# Patient Record
Sex: Male | Born: 1960
Health system: Southern US, Community
[De-identification: ages and names within clinical notes are randomized; demographics above are authoritative.]

## PROBLEM LIST (undated history)

## (undated) DIAGNOSIS — K635 Polyp of colon: Secondary | ICD-10-CM

## (undated) DIAGNOSIS — Z87442 Personal history of urinary calculi: Secondary | ICD-10-CM

## (undated) DIAGNOSIS — T8189XA Other complications of procedures, not elsewhere classified, initial encounter: Secondary | ICD-10-CM

## (undated) DIAGNOSIS — E785 Hyperlipidemia, unspecified: Secondary | ICD-10-CM

## (undated) DIAGNOSIS — M549 Dorsalgia, unspecified: Secondary | ICD-10-CM

## (undated) DIAGNOSIS — K219 Gastro-esophageal reflux disease without esophagitis: Secondary | ICD-10-CM

## (undated) DIAGNOSIS — M1990 Unspecified osteoarthritis, unspecified site: Secondary | ICD-10-CM

## (undated) DIAGNOSIS — F32A Depression, unspecified: Secondary | ICD-10-CM

## (undated) DIAGNOSIS — G8929 Other chronic pain: Secondary | ICD-10-CM

## (undated) DIAGNOSIS — R519 Headache, unspecified: Secondary | ICD-10-CM

## (undated) DIAGNOSIS — T7840XA Allergy, unspecified, initial encounter: Secondary | ICD-10-CM

## (undated) DIAGNOSIS — F329 Major depressive disorder, single episode, unspecified: Secondary | ICD-10-CM

## (undated) DIAGNOSIS — F419 Anxiety disorder, unspecified: Secondary | ICD-10-CM

## (undated) DIAGNOSIS — J449 Chronic obstructive pulmonary disease, unspecified: Secondary | ICD-10-CM

## (undated) HISTORY — DX: Depression, unspecified: F32.A

## (undated) HISTORY — DX: Anxiety disorder, unspecified: F41.9

## (undated) HISTORY — DX: Hyperlipidemia, unspecified: E78.5

## (undated) HISTORY — PX: SHOULDER SURGERY: SHX246

## (undated) HISTORY — DX: Allergy, unspecified, initial encounter: T78.40XA

## (undated) HISTORY — DX: Polyp of colon: K63.5

## (undated) HISTORY — PX: EYE SURGERY: SHX253

## (undated) HISTORY — PX: COLONOSCOPY: SHX174

## (undated) HISTORY — PX: NECK SURGERY: SHX720

## (undated) HISTORY — PX: COLONOSCOPY W/ BIOPSIES AND POLYPECTOMY: SHX1376

## (undated) HISTORY — DX: Unspecified osteoarthritis, unspecified site: M19.90

## (undated) HISTORY — PX: SPINE SURGERY: SHX786

## (undated) HISTORY — PX: BACK SURGERY: SHX140

## (undated) HISTORY — PX: TONSILLECTOMY: SUR1361

## (undated) HISTORY — DX: Major depressive disorder, single episode, unspecified: F32.9

---

## 2000-07-15 ENCOUNTER — Encounter: Payer: Self-pay | Admitting: Cardiology

## 2000-07-15 ENCOUNTER — Inpatient Hospital Stay (HOSPITAL_COMMUNITY): Admission: EM | Admit: 2000-07-15 | Discharge: 2000-07-16 | Payer: Self-pay | Admitting: Emergency Medicine

## 2000-07-16 ENCOUNTER — Encounter: Payer: Self-pay | Admitting: Cardiology

## 2004-12-02 ENCOUNTER — Ambulatory Visit (HOSPITAL_COMMUNITY): Admission: RE | Admit: 2004-12-02 | Discharge: 2004-12-03 | Payer: Self-pay | Admitting: Orthopaedic Surgery

## 2004-12-15 ENCOUNTER — Encounter: Admission: RE | Admit: 2004-12-15 | Discharge: 2004-12-15 | Payer: Self-pay | Admitting: Orthopaedic Surgery

## 2004-12-30 ENCOUNTER — Encounter: Admission: RE | Admit: 2004-12-30 | Discharge: 2004-12-30 | Payer: Self-pay | Admitting: Orthopaedic Surgery

## 2008-05-29 ENCOUNTER — Ambulatory Visit (HOSPITAL_COMMUNITY): Admission: RE | Admit: 2008-05-29 | Discharge: 2008-05-29 | Payer: Self-pay | Admitting: Internal Medicine

## 2009-06-04 ENCOUNTER — Encounter: Payer: Self-pay | Admitting: Gastroenterology

## 2009-06-06 ENCOUNTER — Encounter (INDEPENDENT_AMBULATORY_CARE_PROVIDER_SITE_OTHER): Payer: Self-pay | Admitting: *Deleted

## 2009-06-16 ENCOUNTER — Encounter (INDEPENDENT_AMBULATORY_CARE_PROVIDER_SITE_OTHER): Payer: Self-pay

## 2009-06-17 ENCOUNTER — Ambulatory Visit: Payer: Self-pay | Admitting: Gastroenterology

## 2009-07-01 ENCOUNTER — Ambulatory Visit: Payer: Self-pay | Admitting: Gastroenterology

## 2009-07-01 LAB — HM COLONOSCOPY

## 2009-07-04 ENCOUNTER — Encounter: Payer: Self-pay | Admitting: Gastroenterology

## 2010-06-04 NOTE — Miscellaneous (Signed)
Summary: Lec previsit  Clinical Lists Changes  Medications: Added new medication of MOVIPREP 100 GM  SOLR (PEG-KCL-NACL-NASULF-NA ASC-C) As per prep instructions. - Signed Rx of MOVIPREP 100 GM  SOLR (PEG-KCL-NACL-NASULF-NA ASC-C) As per prep instructions.;  #1 x 0;  Signed;  Entered by: Ulis Rias RN;  Authorized by: Louis Meckel MD;  Method used: [facility] [address] [phone]*, [address], [place], [place], [place]  [id number], Ph: [phone] or [phone], Fax: [phone] Allergies: Added new allergy or adverse reaction of CEFTIN Observations: Added new observation of NKA: F (06/17/2009 15:56)    Prescriptions: MOVIPREP 100 GM  SOLR (PEG-KCL-NACL-NASULF-NA ASC-C) As per prep instructions.  #1 x 0   Entered by:   Ulis Rias RN   Authorized by:   Louis Meckel MD   Signed by:   Ulis Rias RN on 06/17/2009   Method used:   [facility]  [address] [phone]* (retail)       [address]       [place], [place]  [id number]       Ph: [phone] or [phone]       Fax: [phone]   RxID:   [id number]

## 2010-06-04 NOTE — Letter (Signed)
Summary: Patient Notice-Hyperplastic Polyps  [facility]  [address] [place], [place] [address]   Phone: [phone]  Fax: [phone]        July 04, 2009 MRN: [id number]    [name] [address] [place], [place]  [address]    Dear [name],  I am pleased to inform you that the colon polyp(s) removed during your recent colonoscopy was (were) found to be hyperplastic.  These types of polyps are NOT pre-cancerous.  It is therefore my recommendation that you have a repeat colonoscopy examination in _5 years for routine colorectal cancer screening.  Should you develop new or worsening symptoms of abdominal pain, bowel habit changes or bleeding from the rectum or bowels, please schedule an evaluation with either your primary care physician or with me.  Additional information/recommendations:  __No further action with gastroenterology is needed at this time.      Please follow-up with your primary care physician for your other      healthcare needs. __Please call [phone] to schedule a return visit to review      your situation.  __Please keep your follow-up visit as already scheduled.  _x_Continue treatment plan as outlined the day of your exam.  Please call us if you are having persistent problems or have questions about your condition that have not been fully answered at this time.  Sincerely,  Louis Meckel MD This letter has been electronically signed by your physician.  Appended Document: Patient Notice-Hyperplastic Polyps Letter mailed 3.7.11

## 2010-06-04 NOTE — Letter (Signed)
Summary: [facility] Instructions  [facility]  [address] [place], [place] [address]   Phone: [phone]  Fax: [phone]       [name]    02-16-1961    MRN: [id number]        Procedure Day /Date: 07/01/09  Tuesday     Arrival Time:  10:30am     Procedure Time: 11:30am     Location of Procedure:                    _x _  [facility] (4th Floor)                        PREPARATION FOR COLONOSCOPY WITH MOVIPREP   Starting 5 days prior to your procedure _2/24/11 _ do not eat nuts, seeds, popcorn, corn, beans, peas,  salads, or any raw vegetables.  Do not take any fiber supplements (e.g. Metamucil, Citrucel, and Benefiber).  THE DAY BEFORE YOUR PROCEDURE         DATE:  06/30/09  DAY:  Monday  1.  Drink clear liquids the entire day-NO SOLID FOOD  2.  Do not drink anything colored red or purple.  Avoid juices with pulp.  No orange juice.  3.  Drink at least 64 oz. (8 glasses) of fluid/clear liquids during the day to prevent dehydration and help the prep work efficiently.  CLEAR LIQUIDS INCLUDE: Water Jello Ice Popsicles Tea (sugar ok, no milk/cream) Powdered fruit flavored drinks Coffee (sugar ok, no milk/cream) Gatorade Juice: apple, white grape, white cranberry  Lemonade Clear bullion, consomm, broth Carbonated beverages (any kind) Strained chicken noodle soup Hard Candy                             4.  In the morning, mix first dose of MoviPrep solution:    Empty 1 Pouch A and 1 Pouch B into the disposable container    Add lukewarm drinking water to the top line of the container. Mix to dissolve    Refrigerate (mixed solution should be used within 24 hrs)  5.  Begin drinking the prep at 5:00 p.m. The MoviPrep container is divided by 4 marks.   Every 15 minutes drink the solution down to the next mark (approximately 8 oz) until the full liter is complete.   6.  Follow completed prep with 16 oz of clear liquid of your choice  (Nothing red or purple).  Continue to drink clear liquids until bedtime.  7.  Before going to bed, mix second dose of MoviPrep solution:    Empty 1 Pouch A and 1 Pouch B into the disposable container    Add lukewarm drinking water to the top line of the container. Mix to dissolve    Refrigerate  THE DAY OF YOUR PROCEDURE      DATE:  07/01/09 DAY:  Tueday  Beginning at 6:30 a.m. (5 hours before procedure):         1. Every 15 minutes, drink the solution down to the next mark (approx 8 oz) until the full liter is complete.  2. Follow completed prep with 16 oz. of clear liquid of your choice.    3. You may drink clear liquids until 9:30am   (2 HOURS BEFORE PROCEDURE).   MEDICATION INSTRUCTIONS  Unless otherwise instructed, you should take regular prescription medications with a small sip of water   as  early as possible the morning of your procedure.         OTHER INSTRUCTIONS  You will need a responsible adult at least 49 years of age to accompany you and drive you home.   This person must remain in the waiting room during your procedure.  Wear loose fitting clothing that is easily removed.  Leave jewelry and other valuables at home.  However, you may wish to bring a book to read or  an iPod/MP3 player to listen to music as you wait for your procedure to start.  Remove all body piercing jewelry and leave at home.  Total time from sign-in until discharge is approximately 2-3 hours.  You should go home directly after your procedure and rest.  You can resume normal activities the  day after your procedure.  The day of your procedure you should not:   Drive   Make legal decisions   Operate machinery   Drink alcohol   Return to work  You will receive specific instructions about eating, activities and medications before you leave.    The above instructions have been reviewed and explained to me by   Ulis Rias RN  June 17, 2009 4:09 PM     I fully  understand and can verbalize these instructions _____________________________ Date _________

## 2010-06-04 NOTE — Procedures (Signed)
Summary: Colonoscopy  Patient: [name] Note: All result statuses are Final unless otherwise noted.  Tests: (1) Colonoscopy (COL)   COL Colonoscopy           DONE     [facility]     [address]     [place], [place]  [address]           COLONOSCOPY PROCEDURE REPORT           PATIENT:  Richard, [name]  MR#:  [id number]     BIRTHDATE:  1960-08-30, 48 yrs. old  GENDER:  male           ENDOSCOPIST:  Barbette Hair. Arlyce Dice, MD     Referred by:           PROCEDURE DATE:  07/01/2009     PROCEDURE:  Colonoscopy with snare polypectomy     ASA CLASS:  Class II     INDICATIONS:  Elevated Risk Screening, family history of colon     cancer Fraternal grandfather, father, fraternal uncle           MEDICATIONS:   Fentanyl 75 mcg IV, Versed 7 mg IV, Benadryl 50 mg     IV           DESCRIPTION OF PROCEDURE:   After the risks benefits and     alternatives of the procedure were thoroughly explained, informed     consent was obtained.  Digital rectal exam was performed and     revealed no abnormalities.   The LB CF-[id number] endoscope     was introduced through the anus and advanced to the cecum, which     was identified by the ileocecal valve, without limitations.  The     quality of the prep was excellent, using MoviPrep.  The instrument     was then slowly withdrawn as the colon was fully examined.     <<PROCEDUREIMAGES>>           FINDINGS:  There were multiple polyps identified and removed. in     the rectum and sigmoid colon. At least 4 1-81mm hyperplastic     appearing polyps in rectum and sigmoid. 3 were removed via cold     polypectomy snare and submitted to pathology (see image13,     image14, and image15).  A diverticulum was found in the cecum (see     image2).  Mild diverticulosis was found in the sigmoid colon (see     image11).  This was otherwise a normal examination of the colon     (see image1, image4, image5, image6, image7, image9, image10, and  image12).   Retroflexed views in the rectum revealed no     abnormalities.    The scope was then withdrawn from the patient     and the procedure completed.           COMPLICATIONS:  None           ENDOSCOPIC IMPRESSION:     1) Polyps, multiple in the rectum and sigmoid colon     2) Diverticulum in the cecum     3) Mild diverticulosis in the sigmoid colon     4) Otherwise normal examination     RECOMMENDATIONS:     1) If the polyp(s) removed today are proven to be adenomatous     (pre-cancerous) polyps, you will need a repeat colonoscopy in 5     years. Otherwise you should continue to follow colorectal  cancer     screening guidelines for "routine risk" patients with colonoscopy     in 10 years.     2) Given your significant family history of colon cancer, you     should have a repeat colonoscopy in 5 years           REPEAT EXAM:  In 5 year(s) for Colonoscopy.           ______________________________     Barbette Hair. Arlyce Dice, MD           CC:  Marisue Brooklyn, DO           n.     eSIGNED:   Barbette Hair. Kaplan at 07/01/2009 12:30 PM           Page 2 of 3   [place], [place], [id number]  Note: An exclamation mark (!) indicates a result that was not dispersed into the flowsheet. Document Creation Date: 07/01/2009 12:30 PM _______________________________________________________________________  (1) Order result status: Final Collection or observation date-time: 07/01/2009 12:21 Requested date-time:  Receipt date-time:  Reported date-time:  Referring Physician:   Ordering Physician: Melvia Heaps [phone]) Specimen Source:  Source: Launa Grill Order Number: [id number] Lab site:   Appended Document: Colonoscopy     Procedures Next Due Date:    Colonoscopy: 07/2014

## 2010-06-04 NOTE — Letter (Signed)
Summary: Previsit letter  [facility]  [address] [place], [place] [address]   Phone: [phone]  Fax: [phone]       06/06/2009 MRN: [id number]  [facility] [address] [place], [place]  [address]  Dear [name],  Welcome to the Gastroenterology Division at [facility].    You are scheduled to see a nurse for your pre-procedure visit on 06-17-09 at 4:00p.m. on the 3rd floor at [facility], [address]  We ask that you try to arrive at our office 15 minutes prior to your appointment time to allow for check-in.  Your nurse visit will consist of discussing your medical and surgical history, your immediate family medical history, and your medications.    Please bring a complete list of all your medications or, if you prefer, bring the medication bottles and we will list them.  We will need to be aware of both prescribed and over the counter drugs.  We will need to know exact dosage information as well.  If you are on blood thinners (Coumadin, Plavix, Aggrenox, Ticlid, etc.) please call our office today/prior to your appointment, as we need to consult with your physician about holding your medication.   Please be prepared to read and sign documents such as consent forms, a financial agreement, and acknowledgement forms.  If necessary, and with your consent, a friend or relative is welcome to sit-in on the nurse visit with you.  Please bring your insurance card so that we may make a copy of it.  If your insurance requires a referral to see a specialist, please bring your referral form from your primary care physician.  No co-pay is required for this nurse visit.     If you cannot keep your appointment, please call [phone] to cancel or reschedule prior to your appointment date.  This allows [place] the opportunity to schedule an appointment for another patient in need of care.    Thank you for choosing [facility] for your medical  needs.  We appreciate the opportunity to care for you.  Please visit [place] at our website  to learn more about our practice.                     Sincerely.                                                                                                                   The Gastroenterology Division

## 2010-09-03 ENCOUNTER — Encounter: Payer: Self-pay | Admitting: Family Medicine

## 2010-09-03 ENCOUNTER — Ambulatory Visit (INDEPENDENT_AMBULATORY_CARE_PROVIDER_SITE_OTHER): Payer: BC Managed Care – PPO | Admitting: Family Medicine

## 2010-09-03 VITALS — BP 108/72 | Ht 71.75 in | Wt 229.8 lb

## 2010-09-03 DIAGNOSIS — F17200 Nicotine dependence, unspecified, uncomplicated: Secondary | ICD-10-CM | POA: Insufficient documentation

## 2010-09-03 DIAGNOSIS — Z720 Tobacco use: Secondary | ICD-10-CM | POA: Insufficient documentation

## 2010-09-03 DIAGNOSIS — M129 Arthropathy, unspecified: Secondary | ICD-10-CM

## 2010-09-03 DIAGNOSIS — Z0001 Encounter for general adult medical examination with abnormal findings: Secondary | ICD-10-CM | POA: Insufficient documentation

## 2010-09-03 DIAGNOSIS — M1990 Unspecified osteoarthritis, unspecified site: Secondary | ICD-10-CM

## 2010-09-03 DIAGNOSIS — Z0000 Encounter for general adult medical examination without abnormal findings: Secondary | ICD-10-CM | POA: Insufficient documentation

## 2010-09-03 LAB — CBC WITH DIFFERENTIAL/PLATELET
Basophils Absolute: 0.1 10*3/uL (ref 0.0–0.1)
Basophils Relative: 0.7 % (ref 0.0–3.0)
Eosinophils Absolute: 0.2 10*3/uL (ref 0.0–0.7)
Eosinophils Relative: 2.1 % (ref 0.0–5.0)
HCT: 45.1 % (ref 39.0–52.0)
Hemoglobin: 15.4 g/dL (ref 13.0–17.0)
Lymphocytes Relative: 26 % (ref 12.0–46.0)
Lymphs Abs: 2.9 10*3/uL (ref 0.7–4.0)
MCHC: 34.3 g/dL (ref 30.0–36.0)
MCV: 94.1 fl (ref 78.0–100.0)
Monocytes Absolute: 0.7 10*3/uL (ref 0.1–1.0)
Monocytes Relative: 6.4 % (ref 3.0–12.0)
Neutro Abs: 7.2 10*3/uL (ref 1.4–7.7)
Neutrophils Relative %: 64.8 % (ref 43.0–77.0)
Platelets: 277 10*3/uL (ref 150.0–400.0)
RBC: 4.79 Mil/uL (ref 4.22–5.81)
RDW: 14 % (ref 11.5–14.6)
WBC: 11.2 10*3/uL — ABNORMAL HIGH (ref 4.5–10.5)

## 2010-09-03 LAB — BASIC METABOLIC PANEL
BUN: 18 mg/dL (ref 6–23)
CO2: 29 mEq/L (ref 19–32)
Calcium: 9.5 mg/dL (ref 8.4–10.5)
Chloride: 100 mEq/L (ref 96–112)
Creatinine, Ser: 0.9 mg/dL (ref 0.4–1.5)
GFR: 97.92 mL/min (ref >60.00)
Glucose, Bld: 89 mg/dL (ref 70–99)
Potassium: 5.1 mEq/L (ref 3.5–5.1)
Sodium: 137 mEq/L (ref 135–145)

## 2010-09-03 LAB — LIPID PANEL
Cholesterol: 192 mg/dL (ref 0–200)
HDL: 52.4 mg/dL (ref >39.00)
LDL Cholesterol: 124 mg/dL — ABNORMAL HIGH (ref 0–99)
Total CHOL/HDL Ratio: 4
Triglycerides: 78 mg/dL (ref 0.0–149.0)
VLDL: 15.6 mg/dL (ref 0.0–40.0)

## 2010-09-03 LAB — POCT URINALYSIS DIPSTICK
Bilirubin, UA: NEGATIVE
Blood, UA: NEGATIVE
Glucose, UA: NEGATIVE
Ketones, UA: NEGATIVE
Leukocytes, UA: NEGATIVE
Nitrite, UA: NEGATIVE
Protein, UA: NEGATIVE
Spec Grav, UA: 1.005
Urobilinogen, UA: 0.2
pH, UA: 7.5

## 2010-09-03 LAB — HEPATIC FUNCTION PANEL
ALT: 21 U/L (ref 0–53)
AST: 17 U/L (ref 0–37)
Albumin: 4.2 g/dL (ref 3.5–5.2)
Alkaline Phosphatase: 66 U/L (ref 39–117)
Bilirubin, Direct: 0.1 mg/dL (ref 0.0–0.3)
Total Bilirubin: 0.6 mg/dL (ref 0.3–1.2)
Total Protein: 6.8 g/dL (ref 6.0–8.3)

## 2010-09-03 LAB — TSH: TSH: 1.31 u[IU]/mL (ref 0.35–5.50)

## 2010-09-03 LAB — PSA: PSA: 0.5 ng/mL (ref 0.10–4.00)

## 2010-09-03 MED ORDER — VARENICLINE TARTRATE 0.5 MG X 11 & 1 MG X 42 PO MISC: ORAL | Status: DC

## 2010-09-03 MED ORDER — MELOXICAM 15 MG PO TABS: ORAL_TABLET | ORAL | Status: DC

## 2010-09-03 NOTE — Progress Notes (Signed)
  Subjective:    Patient ID: [name], male    DOB: 08-15-1960, 50 y.o.   MRN: [id number]  HPI Pt here for cpe and to establish.  No complaints.      Review of Systems  Review of Systems  Constitutional: Negative for activity change, appetite change and fatigue.  HENT: Negative for hearing loss, congestion, tinnitus and ear discharge.  dentist q79m Eyes: Negative for visual disturbance (see optho q2y -- vision corrected to 20/20 with glasses).  Respiratory: Negative for cough, chest tightness and shortness of breath.   Cardiovascular: Negative for chest pain, palpitations and leg swelling.  Gastrointestinal: Negative for abdominal pain, diarrhea, constipation and abdominal distention.  Genitourinary: Negative for urgency, frequency, decreased urine volume and difficulty urinating.  Musculoskeletal: Negative for back pain, arthralgias and gait problem.  Skin: Negative for color change, pallor and rash.  Neurological: Negative for dizziness, light-headedness, numbness and headaches.  Hematological: Negative for adenopathy. Does not bruise/bleed easily.  Psychiatric/Behavioral: Negative for suicidal ideas, confusion, sleep disturbance, self-injury, dysphoric mood, decreased concentration and agitation.      Objective:   Physical Exam    BP 108/72  Ht 5' 11.75" (1.822 m)  Wt 229 lb 12.8 oz (104.237 kg)  BMI 31.38 kg/m2  General Appearance:    Alert, cooperative, no distress, appears stated age  Head:    Normocephalic, without obvious abnormality, atraumatic  Eyes:    PERRL, conjunctiva/corneas clear, EOM's intact, fundi    benign, both eyes       Ears:    Normal TM's and external ear canals, both ears  Nose:   Nares normal, septum midline, mucosa normal, no drainage   or sinus tenderness  Throat:   Lips, mucosa, and tongue normal; teeth and gums normal  Neck:   Supple, symmetrical, trachea midline, no adenopathy;       thyroid:  No enlargement/tenderness/nodules; no  carotid   bruit or JVD  Back:     Symmetric, no curvature, ROM normal, no CVA tenderness  Lungs:     Clear to auscultation bilaterally, respirations unlabored  Chest wall:    No tenderness or deformity  Heart:    Regular rate and rhythm, S1 and S2 normal, no murmur, rub   or gallop  Abdomen:     Soft, non-tender, bowel sounds active all four quadrants,    no masses, no organomegaly  Genitalia:    Normal male without lesion, discharge or tenderness  Rectal:    Normal tone, normal prostate, no masses or tenderness;   guaiac negative stool  Extremities:   Extremities normal, atraumatic, no cyanosis or edema  Pulses:   2+ and symmetric all extremities  Skin:   Skin color, texture, turgor normal, no rashes or lesions  Lymph nodes:   Cervical, supraclavicular, and axillary nodes normal  Neurologic:   CNII-XII intact. Normal strength, sensation and reflexes      throughout      Assessment & Plan:

## 2010-09-03 NOTE — Assessment & Plan Note (Signed)
Check labs  ghm utd   

## 2010-09-03 NOTE — Assessment & Plan Note (Signed)
chantix starting  con't for at least 3 months

## 2010-09-03 NOTE — Patient Instructions (Signed)
Smoking Cessation This document explains the best ways for you to quit smoking and new treatments to help. It lists new medicines that can double or triple your chances of quitting and quitting for good. It also considers ways to avoid relapses and concerns you may have about quitting, including weight gain. NICOTINE: A POWERFUL ADDICTION If you have tried to quit smoking, you know how hard it can be. It is hard because nicotine is a very addictive drug. For some people, it can be as addictive as heroin or cocaine. Usually, people make 2 or 3 tries, or more, before finally being able to quit. Each time you try to quit, you can learn about what helps and what hurts. Quitting takes hard work and a lot of effort, but you can quit smoking. QUITTING SMOKING IS ONE OF THE MOST IMPORTANT THINGS YOU WILL EVER DO:  You will live longer, feel better, and live better.   The impact on your body of quitting smoking is felt almost immediately:   Within 20 minutes, blood pressure decreases. Pulse returns to its normal level.   After 8 hours, carbon monoxide levels in the blood return to normal. Oxygen level increases.   After 24 hours, chance of heart attack starts to decrease. Breath, hair, and body stop smelling like smoke.   After 48 hours, damaged nerve endings begin to recover. Sense of taste and smell improve.   After 72 hours, the body is virtually free of nicotine. Bronchial tubes relax and breathing becomes easier.   After 2 to 12 weeks, lungs can hold more air. Exercise becomes easier and circulation improves.   Quitting will lower your chance of having a heart attack, stroke, cancer, or lung disease:   After 1 year, the risk of coronary heart disease is cut in half.   After 5 years, the risk of stroke falls to the same as a nonsmoker.   After 10 years, the risk of lung cancer is cut in half and the risk of other cancers decreases significantly.   After 15 years, the risk of coronary heart  disease drops, usually to the level of a nonsmoker.   If you are pregnant, quitting smoking will improve your chances of having a healthy baby.   The people you live with, especially your children, will be healthier.   You will have extra money to spend on things other than cigarettes.  FIVE KEYS TO QUITTING Studies have shown that these 5 steps will help you quit smoking and quit for good. You have the best chances of quitting if you use them together: 1. Get ready.  2. Get support and encouragement.  3. Learn new skills and behaviors.  4. Get medicine to reduce your nicotine addiction and use it correctly.  5. Be prepared for relapse or difficult situations. Be determined to continue trying to quit, even if you do not succeed at first.  1. GET READY  Set a quit date.   Change your environment.   Get rid of ALL cigarettes, ashtrays, matches, and lighters in your home, car, and place of work.   Do not let people smoke in your home.   Review your past attempts to quit. Think about what worked and what did not.   Once you quit, do not smoke. NOT EVEN A PUFF!  2. GET SUPPORT AND ENCOURAGEMENT Studies have shown that you have a better chance of being successful if you have help. You can get support in many ways.  Tell   your family, friends, and coworkers that you are going to quit and need their support. Ask them not to smoke around you.   Talk to your caregivers (doctor, dentist, nurse, pharmacist, psychologist, and/or smoking counselor).   Get individual, group, or telephone counseling and support. The more counseling you have, the better your chances are of quitting. Programs are available at local hospitals and health centers. Call your local health department for information about programs in your area.   Spiritual beliefs and practices may help some smokers quit.   Quit meters are small computer programs online or downloadable that keep track of quit statistics, such as amount  of "quit-time," cigarettes not smoked, and money saved.   Many smokers find one or more of the many self-help books available useful in helping them quit and stay off tobacco.  3. LEARN NEW SKILLS AND BEHAVIORS  Try to distract yourself from urges to smoke. Talk to someone, go for a walk, or occupy your time with a task.   When you first try to quit, change your routine. Take a different route to work. Drink tea instead of coffee. Eat breakfast in a different place.   Do something to reduce your stress. Take a hot bath, exercise, or read a book.   Plan something enjoyable to do every day. Reward yourself for not smoking.   Explore interactive web-based programs that specialize in helping you quit.  4. GET MEDICINE AND USE IT CORRECTLY Medicines can help you stop smoking and decrease the urge to smoke. Combining medicine with the above behavioral methods and support can quadruple your chances of successfully quitting smoking. The U[place]. Food and Drug Administration (FDA) has approved 7 medicines to help you quit smoking. These medicines fall into 3 categories.  Nicotine replacement therapy (delivers nicotine to your body without the negative effects and risks of smoking):   Nicotine gum: Available over-the-counter.   Nicotine lozenges: Available over-the-counter.   Nicotine inhaler: Available by prescription.   Nicotine nasal spray: Available by prescription.   Nicotine skin patches (transdermal): Available by prescription and over-the-counter.   Antidepressant medicine (helps people abstain from smoking, but how this works is unknown):   Bupropion sustained-release (SR) tablets: Available by prescription.   Nicotinic receptor partial agonist (simulates the effect of nicotine in your brain):   Varenicline tartrate tablets: Available by prescription.   Ask your caregiver for advice about which medicines to use and how to use them. Carefully read the information on the package.    Everyone who is trying to quit may benefit from using a medicine. If you are pregnant or trying to become pregnant, nursing an infant, you are under age 18, or you smoke fewer than 10 cigarettes per day, talk to your caregiver before taking any nicotine replacement medicines.   You should stop using a nicotine replacement product and call your caregiver if you experience nausea, dizziness, weakness, vomiting, fast or irregular heartbeat, mouth problems with the lozenge or gum, or redness or swelling of the skin around the patch that does not go away.   Do not use any other product containing nicotine while using a nicotine replacement product.   Talk to your caregiver before using these products if you have diabetes, heart disease, asthma, stomach ulcers, you had a recent heart attack, you have high blood pressure that is not controlled with medicine, a history of irregular heartbeat, or you have been prescribed medicine to help you quit smoking.  5. BE PREPARED FOR RELAPSE OR   DIFFICULT SITUATIONS  Most relapses occur within the first 3 months after quitting. Do not be discouraged if you start smoking again. Remember, most people try several times before they finally quit.   You may have symptoms of withdrawal because your body is used to nicotine. You may crave cigarettes, be irritable, feel very hungry, cough often, get headaches, or have difficulty concentrating.   The withdrawal symptoms are only temporary. They are strongest when you first quit, but they will go away within 10 to 14 days.  Here are some difficult situations to watch for:  Alcohol. Avoid drinking alcohol. Drinking lowers your chances of successfully quitting.   Caffeine. Try to reduce the amount of caffeine you consume. It also lowers your chances of successfully quitting.   Other smokers. Being around smoking can make you want to smoke. Avoid smokers.   Weight gain. Many smokers will gain weight when they quit, usually  less than 10 pounds. Eat a healthy diet and stay active. Do not let weight gain distract you from your main goal, quitting smoking. Some medicines that help you quit smoking may also help delay weight gain. You can always lose the weight gained after you quit.   Bad mood or depression. There are a lot of ways to improve your mood other than smoking.  If you are having problems with any of these situations, talk to your caregiver. SPECIAL SITUATIONS OR CONDITIONS Studies suggest that everyone can quit smoking. Your situation or condition can give you a special reason to quit.  Pregnant women/New mothers: By quitting, you protect your baby's health and your own.   Hospitalized patients: By quitting, you reduce health problems and help healing.   Heart attack patients: By quitting, you reduce your risk of a second heart attack.   Lung, head, and neck cancer patients: By quitting, you reduce your chance of a second cancer.   Parents of children and adolescents: By quitting, you protect your children from illnesses caused by secondhand smoke.  QUESTIONS TO THINK ABOUT Think about the following questions before you try to stop smoking. You may want to talk about your answers with your caregiver.  Why do you want to quit?   If you tried to quit in the past, what helped and what did not?   What will be the most difficult situations for you after you quit? How will you plan to handle them?   Who can help you through the tough times? Your family? Friends? Caregiver?   What pleasures do you get from smoking? What ways can you still get pleasure if you quit?  Here are some questions to ask your caregiver:  How can you help me to be successful at quitting?   What medicine do you think would be best for me and how should I take it?   What should I do if I need more help?   What is smoking withdrawal like? How can I get information on withdrawal?  Quitting takes hard work and a lot of effort,  but you can quit smoking. FOR MORE INFORMATION Smokefree.gov (http://www.smokefree.gov) provides free, accurate, evidence-based information and professional assistance to help support the immediate and long-term needs of people trying to quit smoking. Document Released: 04/13/2001 Document Re-Released: 10/07/2009 ExitCare Patient Information 2011 [employer]. 

## 2010-09-18 NOTE — Op Note (Signed)
[name], [name]            ACCOUNT NO.:  [id number]   MEDICAL RECORD NO.:  [id number]          PATIENT TYPE:  OIB   LOCATION:  2858                         FACILITY:  [facility]   PHYSICIAN:  Mark C. Ophelia Charter, M.D.    DATE OF BIRTH:  08/08/1960   DATE OF PROCEDURE:  12/02/2004  DATE OF DISCHARGE:                                 OPERATIVE REPORT   PREOPERATIVE DIAGNOSIS:  C5-6 and C6-7 spondylosis with disk protrusion.   POSTOPERATIVE DIAGNOSIS:  C5-6 and C6-7 spondylosis with disk protrusion.   OPERATION PERFORMED:  C5-6, C6-7 anterior cervical diskectomy and fusion,  allograft and plating (two level fusion).   SURGEON:  Mark C. Ophelia Charter, M.D.   ANESTHESIA:  GOT.   ESTIMATED BLOOD LOSS:  Minimal.   DRAINS:  One Hemovac neck.   DESCRIPTION OF PROCEDURE:  After induction of general anesthesia,  preoperative Ancef prophylaxis, standard prep with head halter traction  using horse shoe head holder well padded with careful positioning, arms at  the side with a towel with a yellow foam pads for the ulnar nerve and elbow,  neck was prepped with DuraPrep, area was squared with towels.  Sterile skin  markers used.  Betadine, Vi-drape application, sterile Mayo stand head,  thyroid sheets and drapes.  Incision was made at the midline, extending to  the left over the C6 level.  Dissection continued underneath the omohyoid  after splitting the platysma in line with the fibers and blunt dissection  down to longus colli with carotid sheath and contents lateral.  Needle was  placed at C5-6 confirmed with lateral C-spine fluoroscopy.  Self-retaining  retractors were placed and C5-6 level was surgically treated first.  After  diskectomy was performed, progressing back to the posterior longitudinal  ligament, the TPS bur was used for burring of the end plates removing the  anterior osteophytes.  Posterior spurs were thinned down. Then with the  operating microscope draped and brought in, the spurs  were removed with  microdissection techniques using 1 and 2 mm Kerrisons, microcurettes and  pituitary.  1 mm Kerrison was used to remove the posterior longitudinal  ligament.  Dura was well visualized.  Spurs were removed on each side.  Gutters were stripped off vertebral joint for egress of any fluid.  Trial  sizers were used and a 7L graft fit nicely with tight fit.  Distraction  applied by the CRNA and head halter traction.  Graft was impacted with a  hammer and identical procedure was repeated at C6-7.  There was disk  material protruding on both sides.  All the disk material was retained by  the ligament but the ligament was bulging with some disk material underneath  the ligament and behind the vertebral body both above and below.  This was  gently removed as well as the ligament until the dura was well visualized.  Spurs were removed that were indenting the dura as well portion of the hard  disk complex.  After irrigation with saline solution with good decompression  of the dura, another 7L was selected based on size.  The Synthes green  titanium plate was selected and 14 mm blue screws were inserted.  A small  screw plate holder was inserted first to hold the plate securely and  initially one screw was a little bit low, had to be removed repositioned and  then reinserted.  It was checked under AP and lateral.  There was good  position of plate and screws.  Fluoroscopic picture was taken after  irrigation with saline solution and all four  screws were tight.  Hemovac was placed through a separate stab incision  using the trocar with an inside to out technique in line with the skin  incision. Platysma closed with 3-0 Vicryl, 4-0 Vicryl subcuticular closure.  Tincture of benzoin and Steri-Strips, 4 x 4s, tape and soft cervical collar.  Instrument count, needle count was correct.       MCY/MEDQ  D:  12/02/2004  T:  12/03/2004  Job:  [id number]

## 2010-09-18 NOTE — Discharge Summary (Signed)
[place]  Patient:    [name], [name]                   MRN: [id number] Adm. Date:  [id number] Disc. Date: [id number] Attending:  Learta Codding Dictator:   Joellyn Rued, P.A.-C. CC:         [facility]   Referring Physician Discharge [facility]  DATE OF BIRTH:  09/21/2060  SUMMARY OF HISTORY:  Mr. Finklea is a 50 year old male without cardiac history.  At approximately 9:30 on the day of admission, he spontaneously noticed hives on his forearms, a pressure associated with dizziness, and facial flushing.  He talked to a nurse at work and went to the [facility].  His hives faded during his medical exam, but the pressure and flushing continued with slight improvement with one sublingual nitroglycerin. On presentation to the emergency room at [facility], it was a 1 on a scale of 0-10, associated with mild shortness of breath.  It was noted that he was recently prescribed cephalosporin for sinusitis.  He smokes and has a history of chronic back pain.  His lipid status is unknown.  LABORATORY DATA:  Serial CKs and troponins were negative for myocardial infarction.  Fasting lipids showed a cholesterol of 176, triglycerides 104, HDL 33, and LDL 122.  The hemoglobin was 14.8, hematocrit 43.3, normal indices, platelets 265, and WBC 9.3.  PT 12.5, PTT 26.  Sodium 139, potassium 4.5, BUN 15, creatinine 1.0, glucose 109.  The EKG showed normal sinus rhythm.  HOSPITAL COURSE:  He was admitted to the hospital.  Overnight he did not have any further discomfort.  Enzymes and EKGs were negative for myocardial infarction.  A stress Cardiolite was performed on July 16, 2000.  Utilizing the Bruce protocol, he ambulated, achieving greater than his 85% maximum heart rate.  He did not have any associated EKG changes.  His only complaints were tired and he was slightly short of breath.  Imaging showed an EF of 55% and  no ischemia or scarring.  After review with Theron Arista C. Eden Emms, M.D., it was felt that he could be discharged home.  DISCHARGE DIAGNOSIS:  Chest discomfort of unknown etiology, probable allergic reaction with hives and flushing.  DISPOSITION:  He was discharged home.  SPECIAL INSTRUCTIONS:  He was asked to discontinue the antibiotic that he was taking.  To avoid smoking or tobacco products.  DIET:  Maintain low-salt, low-fat, and low-cholesterol diet.  FOLLOW-UP:  To follow up with his physician at [facility] on [address] DD:  07/27/00 TD:  07/27/00 Job: [id number] ID/[id number]

## 2011-06-08 ENCOUNTER — Ambulatory Visit (INDEPENDENT_AMBULATORY_CARE_PROVIDER_SITE_OTHER): Payer: BC Managed Care – PPO | Admitting: Family Medicine

## 2011-06-08 ENCOUNTER — Encounter: Payer: Self-pay | Admitting: Family Medicine

## 2011-06-08 VITALS — BP 120/80 | HR 98 | Temp 98.0°F | Wt 234.0 lb

## 2011-06-08 DIAGNOSIS — M62838 Other muscle spasm: Secondary | ICD-10-CM

## 2011-06-08 DIAGNOSIS — E785 Hyperlipidemia, unspecified: Secondary | ICD-10-CM

## 2011-06-08 DIAGNOSIS — R319 Hematuria, unspecified: Secondary | ICD-10-CM

## 2011-06-08 LAB — POCT URINALYSIS DIPSTICK
Bilirubin, UA: NEGATIVE
Glucose, UA: NEGATIVE
Ketones, UA: NEGATIVE
Leukocytes, UA: NEGATIVE
Nitrite, UA: NEGATIVE
Protein, UA: NEGATIVE
Spec Grav, UA: 1.015
Urobilinogen, UA: 0.2
pH, UA: 6

## 2011-06-08 MED ORDER — CYCLOBENZAPRINE HCL 10 MG PO TABS: 10.0000 mg | ORAL_TABLET | Freq: Three times a day (TID) | ORAL | Status: AC | PRN

## 2011-06-08 NOTE — Progress Notes (Signed)
  Subjective:    [name] is a 51 y.o. male who presents for evaluation of low back pain. The patient has had recurrent self limited episodes of low back pain in the past. Symptoms have been present for several days and are gradually worsening.  Onset was related to / precipitated by no known injury. The pain is located in the right lumbar area and radiates to the right hip, . The pain is described as burning and sharp and occurs during sleep. He is currently in no pain. Symptoms are exacerbated by twisting and only occurs at night in bed when he moves. Symptoms are improved by getting up and walking around. He has also tried acetaminophen and NSAIDs which provided no symptom relief. He has no other symptoms associated with the back pain. The patient has no "red flag" history indicative of complicated back pain.  The following portions of the patient's history were reviewed and updated as appropriate: allergies, current medications, past family history, past medical history, past social history, past surgical history and problem list.  Review of Systems Pertinent items are noted in HPI.    Objective:   Full range of motion without pain, no tenderness, no spasm, no curvature. Normal reflexes, gait, strength and negative straight-leg raise.    Assessment:    Nonspecific acute low back pain    Plan:    Educational material distributed. Stretching exercises discussed. Short (2-4 day) period of relative rest recommended until acute symptoms improve. Ice to affected area as needed for local pain relief. Heat to affected area as needed for local pain relief. NSAIDs per medication orders. Muscle relaxants per medication orders. Follow-up in 2 weeks. ---if no better

## 2011-06-08 NOTE — Patient Instructions (Signed)
Back Pain, Adult Low back pain is very common. About 1 in 5 people have back pain.The cause of low back pain is rarely dangerous. The pain often gets better over time.About half of people with a sudden onset of back pain feel better in just 2 weeks. About 8 in 10 people feel better by 6 weeks.  CAUSES Some common causes of back pain include:  Strain of the muscles or ligaments supporting the spine.   Wear and tear (degeneration) of the spinal discs.   Arthritis.   Direct injury to the back.  DIAGNOSIS Most of the time, the direct cause of low back pain is not known.However, back pain can be treated effectively even when the exact cause of the pain is unknown.Answering your caregiver's questions about your overall health and symptoms is one of the most accurate ways to make sure the cause of your pain is not dangerous. If your caregiver needs more information, he or she may order lab work or imaging tests (X-rays or MRIs).However, even if imaging tests show changes in your back, this usually does not require surgery. HOME CARE INSTRUCTIONS For many people, back pain returns.Since low back pain is rarely dangerous, it is often a condition that people can learn to manageon their own.   Remain active. It is stressful on the back to sit or stand in one place. Do not sit, drive, or stand in one place for more than 30 minutes at a time. Take short walks on level surfaces as soon as pain allows.Try to increase the length of time you walk each day.   Do not stay in bed.Resting more than 1 or 2 days can delay your recovery.   Do not avoid exercise or work.Your body is made to move.It is not dangerous to be active, even though your back may hurt.Your back will likely heal faster if you return to being active before your pain is gone.   Pay attention to your body when you bend and lift. Many people have less discomfortwhen lifting if they bend their knees, keep the load close to their  bodies,and avoid twisting. Often, the most comfortable positions are those that put less stress on your recovering back.   Find a comfortable position to sleep. Use a firm mattress and lie on your side with your knees slightly bent. If you lie on your back, put a pillow under your knees.   Only take over-the-counter or prescription medicines as directed by your caregiver. Over-the-counter medicines to reduce pain and inflammation are often the most helpful.Your caregiver may prescribe muscle relaxant drugs.These medicines help dull your pain so you can more quickly return to your normal activities and healthy exercise.   Put ice on the injured area.   Put ice in a plastic bag.   Place a towel between your skin and the bag.   Leave the ice on for 15 to 20 minutes, 3 to 4 times a day for the first 2 to 3 days. After that, ice and heat may be alternated to reduce pain and spasms.   Ask your caregiver about trying back exercises and gentle massage. This may be of some benefit.   Avoid feeling anxious or stressed.Stress increases muscle tension and can worsen back pain.It is important to recognize when you are anxious or stressed and learn ways to manage it.Exercise is a great option.  SEEK MEDICAL CARE IF:  You have pain that is not relieved with rest or medicine.   You have   pain that does not improve in 1 week.   You have new symptoms.   You are generally not feeling well.  SEEK IMMEDIATE MEDICAL CARE IF:   You have pain that radiates from your back into your legs.   You develop new bowel or bladder control problems.   You have unusual weakness or numbness in your arms or legs.   You develop nausea or vomiting.   You develop abdominal pain.   You feel faint.  Document Released: 04/19/2005 Document Revised: 12/30/2010 Document Reviewed: 09/07/2010 ExitCare Patient Information 2012 [employer]. 

## 2011-06-11 LAB — URINE CULTURE
Colony Count: NO GROWTH
Organism ID, Bacteria: NO GROWTH

## 2011-12-28 ENCOUNTER — Telehealth: Payer: Self-pay | Admitting: Family Medicine

## 2011-12-28 ENCOUNTER — Other Ambulatory Visit: Payer: Self-pay | Admitting: Family Medicine

## 2011-12-28 DIAGNOSIS — Z0000 Encounter for general adult medical examination without abnormal findings: Secondary | ICD-10-CM

## 2011-12-28 NOTE — Telephone Encounter (Signed)
I can order them but cant promise his ins will pay---they should check with the ins.

## 2011-12-28 NOTE — Telephone Encounter (Signed)
Insurance may not pay for them but I will ask. Dr.Lowne Please advise   KP

## 2011-12-28 NOTE — Telephone Encounter (Signed)
Spoke with wife cindy, advised her to call insurance company and explain her situation in regards to the forms they need filled out for his insurance discount and that his physical is actually scheduled in nov., she understood and will call insurance company and call me back

## 2011-12-28 NOTE — Telephone Encounter (Signed)
Pt wife cindy called and stated he needs a CPE - scheduled for 11.5.13 at 1030, wife stated he needs labs done prior to 9.15.13 for his insurance, can we do them in sept for his cpe in nove? Cb# [id number]

## 2012-01-04 ENCOUNTER — Other Ambulatory Visit: Payer: Self-pay | Admitting: Family Medicine

## 2012-01-04 DIAGNOSIS — Z720 Tobacco use: Secondary | ICD-10-CM

## 2012-01-04 MED ORDER — VARENICLINE TARTRATE 0.5 MG X 11 & 1 MG X 42 PO MISC: ORAL | Status: DC

## 2012-01-04 NOTE — Telephone Encounter (Signed)
Discussed with Arline Asp and the patient has a pending apt for Nov.     KP

## 2012-01-04 NOTE — Telephone Encounter (Signed)
Please advise      KP 

## 2012-01-04 NOTE — Telephone Encounter (Signed)
Wife cindy called states pt is in a meeting all day today, but she says he would like an rx for Chantix sent to [facility] on [address] I advise wife/cindy I was not sure of process so he may need to schedule an appt 1st. Wife stated his target date is 9.28.13 and this a requirement for his new insurance Cb# for her as he will be unavailable [phone]

## 2012-01-04 NOTE — Telephone Encounter (Signed)
He was given rx before so ok to give starting pack #1  And then ov before he runs out--- leave coupon up front and encourage him to call 1800 number to maximize success.

## 2012-01-05 NOTE — Telephone Encounter (Signed)
Just an FYI on below, pt has been contacted & is aware insurance approval is his responsibility Thanks

## 2012-02-03 ENCOUNTER — Other Ambulatory Visit: Payer: Self-pay | Admitting: Family Medicine

## 2012-02-03 NOTE — Telephone Encounter (Signed)
Pt left message on triage line. Called pt back lft message to call in verify pharmacy.      MW

## 2012-02-03 NOTE — Telephone Encounter (Signed)
Rx sent.    MW 

## 2012-02-04 ENCOUNTER — Other Ambulatory Visit: Payer: Self-pay | Admitting: Family Medicine

## 2012-02-04 MED ORDER — VARENICLINE TARTRATE 1 MG PO TABS: 1.0000 mg | ORAL_TABLET | Freq: Two times a day (BID) | ORAL | Status: DC

## 2012-02-04 NOTE — Telephone Encounter (Signed)
Pt needs con't pack, not starting pack --correct?

## 2012-02-04 NOTE — Addendum Note (Signed)
Addended by: Arnette Norris on: 02/04/2012 05:13 PM   Modules accepted: Orders

## 2012-02-04 NOTE — Telephone Encounter (Signed)
Pt states wants second month of Chantix.  Last Rx: varenicline (CHANTIX STARTING MONTH PAK) 0.5 MG X 11 & 1 MG X 42 tablet 53 tablet 0 01/04/2012 02/03/2012 Sig: Take one 0.5mg  tablet by mouth once daily for 3 days, then increase to one 0.5mg  tablet twice daily for 3 days, then increase to one 1mg  tablet twice daily.  Last OV 06/08/11.      Plz Advise.        MW

## 2012-02-04 NOTE — Telephone Encounter (Signed)
Pt continuing.       MW

## 2012-03-03 ENCOUNTER — Encounter: Payer: BC Managed Care – PPO | Admitting: Family Medicine

## 2012-03-07 ENCOUNTER — Ambulatory Visit (INDEPENDENT_AMBULATORY_CARE_PROVIDER_SITE_OTHER): Payer: BC Managed Care – PPO | Admitting: Family Medicine

## 2012-03-07 ENCOUNTER — Encounter: Payer: Self-pay | Admitting: Family Medicine

## 2012-03-07 VITALS — BP 116/76 | HR 76 | Temp 98.6°F | Ht 71.0 in | Wt 244.8 lb

## 2012-03-07 DIAGNOSIS — Z0000 Encounter for general adult medical examination without abnormal findings: Secondary | ICD-10-CM

## 2012-03-07 DIAGNOSIS — R319 Hematuria, unspecified: Secondary | ICD-10-CM

## 2012-03-07 DIAGNOSIS — R5381 Other malaise: Secondary | ICD-10-CM

## 2012-03-07 DIAGNOSIS — M129 Arthropathy, unspecified: Secondary | ICD-10-CM

## 2012-03-07 DIAGNOSIS — R5383 Other fatigue: Secondary | ICD-10-CM | POA: Insufficient documentation

## 2012-03-07 DIAGNOSIS — L723 Sebaceous cyst: Secondary | ICD-10-CM

## 2012-03-07 DIAGNOSIS — Z23 Encounter for immunization: Secondary | ICD-10-CM

## 2012-03-07 DIAGNOSIS — M1990 Unspecified osteoarthritis, unspecified site: Secondary | ICD-10-CM

## 2012-03-07 LAB — CBC WITH DIFFERENTIAL/PLATELET
Basophils Absolute: 0.1 10*3/uL (ref 0.0–0.1)
Basophils Relative: 0.9 % (ref 0.0–3.0)
Eosinophils Absolute: 0.2 10*3/uL (ref 0.0–0.7)
Eosinophils Relative: 2.9 % (ref 0.0–5.0)
HCT: 43.6 % (ref 39.0–52.0)
Hemoglobin: 14.2 g/dL (ref 13.0–17.0)
Lymphocytes Relative: 24.2 % (ref 12.0–46.0)
Lymphs Abs: 1.8 10*3/uL (ref 0.7–4.0)
MCHC: 32.6 g/dL (ref 30.0–36.0)
MCV: 94.1 fl (ref 78.0–100.0)
Monocytes Absolute: 0.5 10*3/uL (ref 0.1–1.0)
Monocytes Relative: 7.4 % (ref 3.0–12.0)
Neutro Abs: 4.7 10*3/uL (ref 1.4–7.7)
Neutrophils Relative %: 64.6 % (ref 43.0–77.0)
Platelets: 263 10*3/uL (ref 150.0–400.0)
RBC: 4.63 Mil/uL (ref 4.22–5.81)
RDW: 13.5 % (ref 11.5–14.6)
WBC: 7.4 10*3/uL (ref 4.5–10.5)

## 2012-03-07 LAB — POCT URINALYSIS DIPSTICK
Bilirubin, UA: NEGATIVE
Glucose, UA: NEGATIVE
Ketones, UA: NEGATIVE
Leukocytes, UA: NEGATIVE
Nitrite, UA: NEGATIVE
Protein, UA: NEGATIVE
Spec Grav, UA: 1.015
Urobilinogen, UA: 0.2
pH, UA: 6.5

## 2012-03-07 LAB — HEPATIC FUNCTION PANEL
ALT: 28 U/L (ref 0–53)
AST: 19 U/L (ref 0–37)
Albumin: 4.1 g/dL (ref 3.5–5.2)
Alkaline Phosphatase: 60 U/L (ref 39–117)
Bilirubin, Direct: 0.2 mg/dL (ref 0.0–0.3)
Total Bilirubin: 0.8 mg/dL (ref 0.3–1.2)
Total Protein: 6.7 g/dL (ref 6.0–8.3)

## 2012-03-07 LAB — BASIC METABOLIC PANEL
BUN: 17 mg/dL (ref 6–23)
CO2: 27 mEq/L (ref 19–32)
Calcium: 9 mg/dL (ref 8.4–10.5)
Chloride: 103 mEq/L (ref 96–112)
Creatinine, Ser: 0.8 mg/dL (ref 0.4–1.5)
GFR: 105.58 mL/min (ref >60.00)
Glucose, Bld: 86 mg/dL (ref 70–99)
Potassium: 4.5 mEq/L (ref 3.5–5.1)
Sodium: 137 mEq/L (ref 135–145)

## 2012-03-07 LAB — LDL CHOLESTEROL, DIRECT: Direct LDL: 150.1 mg/dL

## 2012-03-07 LAB — LIPID PANEL
Cholesterol: 225 mg/dL — ABNORMAL HIGH (ref 0–200)
HDL: 51.5 mg/dL (ref >39.00)
Total CHOL/HDL Ratio: 4
Triglycerides: 95 mg/dL (ref 0.0–149.0)
VLDL: 19 mg/dL (ref 0.0–40.0)

## 2012-03-07 LAB — TSH: TSH: 1.4 u[IU]/mL (ref 0.35–5.50)

## 2012-03-07 LAB — PSA: PSA: 0.45 ng/mL (ref 0.10–4.00)

## 2012-03-07 LAB — FOLLICLE STIMULATING HORMONE: FSH: 4.2 m[IU]/mL (ref 1.4–18.1)

## 2012-03-07 MED ORDER — MELOXICAM 15 MG PO TABS: ORAL_TABLET | ORAL | Status: DC

## 2012-03-07 NOTE — Assessment & Plan Note (Signed)
Check labs 

## 2012-03-07 NOTE — Progress Notes (Signed)
Subjective:    Patient ID: [name], male    DOB: 1960/06/23, 51 y.o.   MRN: [id number]  HPI Pt here for cpe and labs.  He quite smoking about 2 months ago but is now gaining weight.     Review of Systems    Review of Systems  Constitutional: Negative for activity change, appetite change and fatigue.  HENT: Negative for hearing loss, congestion, tinnitus and ear discharge.  dentist --1 year ago Eyes: Negative for visual disturbance (see optho q2y -- vision corrected to 20/20 with glasses).  Respiratory: Negative for cough, chest tightness and shortness of breath.   Cardiovascular: Negative for chest pain, palpitations and leg swelling.  Gastrointestinal: Negative for abdominal pain, diarrhea, constipation and abdominal distention.  Genitourinary: Negative for urgency, frequency, decreased urine volume and difficulty urinating.  Musculoskeletal: Negative for back pain, arthralgias and gait problem.  Skin: Negative for color change, pallor and rash.  Neurological: Negative for dizziness, light-headedness, numbness and headaches.  Hematological: Negative for adenopathy. Does not bruise/bleed easily.  Psychiatric/Behavioral: Negative for suicidal ideas, confusion, sleep disturbance, self-injury, dysphoric mood, decreased concentration and agitation. ---+ fatigue  Past Medical History  Diagnosis Date  . Arthritis     Back  . Colon polyps    History   Social History  . Marital Status: Married    Spouse Name: N/A    Number of Children: N/A  . Years of Education: N/A   Occupational History  . produce Ambulance person [employer]   Social History Main Topics  . Smoking status: Former Smoker -- 1.0 packs/day for 30 years    Types: Cigarettes    Quit date: 01/06/2012  . Smokeless tobacco: Former Neurosurgeon    Types: Snuff    Quit date: 01/06/2012     Comment: quit with chantix 1x but only stayed on it a month  . Alcohol Use: 12.0 oz/week    24 drink(s) per week  . Drug Use: No    . Sexually Active: Yes -- Male partner(s)   Other Topics Concern  . Not on file   Social History Narrative   Exercise--no   Family History  Problem Relation Age of Onset  . Cancer Father 96    Unknown  . Hypertension Mother   . Colon polyps Brother   . Aneurysm Maternal Aunt 58    brain  . Heart disease Maternal Aunt     d 13  . Cancer Paternal Uncle 58    stomach  . Aneurysm Maternal Grandfather     stomach  . Cancer Paternal Grandfather 70    stomach  . Depression Brother 20    suicide  . Cancer Paternal Uncle 8    stomach   Current outpatient prescriptions:meloxicam (MOBIC) 15 MG tablet, 1 po qd prn, Disp: 30 tablet, Rfl: 5    Objective:   Physical Exam BP 116/76  Pulse 76  Temp 98.6 F (37 C) (Oral)  Ht 5\' 11"  (1.803 m)  Wt 244 lb 12.8 oz (111.041 kg)  BMI 34.14 kg/m2  SpO2 98%  General Appearance:    Alert, cooperative, no distress, appears stated age  Head:    Normocephalic, without obvious abnormality, atraumatic  Eyes:    PERRL, conjunctiva/corneas clear, EOM's intact, fundi    benign, both eyes       Ears:    Normal TM's and external ear canals, both ears  Nose:   Nares normal, septum midline, mucosa normal, no drainage   or sinus  tenderness  Throat:   Lips, mucosa, and tongue normal; teeth and gums normal  Neck:   Supple, symmetrical, trachea midline, no adenopathy;       thyroid:  No enlargement/tenderness/nodules; no carotid   bruit or JVD  Back:     Symmetric, no curvature, ROM normal, no CVA tenderness  Lungs:     Clear to auscultation bilaterally, respirations unlabored  Chest wall:    No tenderness or deformity  Heart:    Regular rate and rhythm, S1 and S2 normal, no murmur, rub   or gallop  Abdomen:     Soft, non-tender, bowel sounds active all four quadrants,    no masses, no organomegaly  Genitalia:    Normal male without lesion, discharge or tenderness  Rectal:    Normal tone, normal prostate, no masses or tenderness;   guaiac  negative stool  Extremities:   Extremities normal, atraumatic, no cyanosis or edema  Pulses:   2+ and symmetric all extremities  Skin:   Skin color, texture, turgor normal, no rashes or lesions  Lymph nodes:   Cervical, supraclavicular, and axillary nodes normal  Neurologic:   CNII-XII intact. Normal strength, sensation and reflexes      throughout         Assessment & Plan:  cpe-- check labs             ghm utd

## 2012-03-07 NOTE — Addendum Note (Signed)
Addended by: Silvio Pate D on: 03/07/2012 02:18 PM   Modules accepted: Orders

## 2012-03-07 NOTE — Assessment & Plan Note (Signed)
Refill mobic 

## 2012-03-07 NOTE — Patient Instructions (Addendum)
Preventive Care for Adults, Male A healthy lifestyle and preventive care can promote health and wellness. Preventive health guidelines for men include the following key practices:  A routine yearly physical is a good way to check with your caregiver about your health and preventative screening. It is a chance to share any concerns and updates on your health, and to receive a thorough exam.  Visit your dentist for a routine exam and preventative care every 6 months. Brush your teeth twice a day and floss once a day. Good oral hygiene prevents tooth decay and gum disease.  The frequency of eye exams is based on your age, health, family medical history, use of contact lenses, and other factors. Follow your caregiver's recommendations for frequency of eye exams.  Eat a healthy diet. Foods like vegetables, fruits, whole grains, low-fat dairy products, and lean protein foods contain the nutrients you need without too many calories. Decrease your intake of foods high in solid fats, added sugars, and salt. Eat the right amount of calories for you.Get information about a proper diet from your caregiver, if necessary.  Regular physical exercise is one of the most important things you can do for your health. Most adults should get at least 150 minutes of moderate-intensity exercise (any activity that increases your heart rate and causes you to sweat) each week. In addition, most adults need muscle-strengthening exercises on 2 or more days a week.  Maintain a healthy weight. The body mass index (BMI) is a screening tool to identify possible weight problems. It provides an estimate of body fat based on height and weight. Your caregiver can help determine your BMI, and can help you achieve or maintain a healthy weight.For adults 20 years and older:  A BMI below 18.5 is considered underweight.  A BMI of 18.5 to 24.9 is normal.  A BMI of 25 to 29.9 is considered overweight.  A BMI of 30 and above is  considered obese.  Maintain normal blood lipids and cholesterol levels by exercising and minimizing your intake of saturated fat. Eat a balanced diet with plenty of fruit and vegetables. Blood tests for lipids and cholesterol should begin at age 20 and be repeated every 5 years. If your lipid or cholesterol levels are high, you are over 50, or you are a high risk for heart disease, you may need your cholesterol levels checked more frequently.Ongoing high lipid and cholesterol levels should be treated with medicines if diet and exercise are not effective.  If you smoke, find out from your caregiver how to quit. If you do not use tobacco, do not start.  If you choose to drink alcohol, do not exceed 2 drinks per day. One drink is considered to be 12 ounces (355 mL) of beer, 5 ounces (148 mL) of wine, or 1.5 ounces (44 mL) of liquor.  Avoid use of street drugs. Do not share needles with anyone. Ask for help if you need support or instructions about stopping the use of drugs.  High blood pressure causes heart disease and increases the risk of stroke. Your blood pressure should be checked at least every 1 to 2 years. Ongoing high blood pressure should be treated with medicines, if weight loss and exercise are not effective.  If you are 45 to 50 years old, ask your caregiver if you should take aspirin to prevent heart disease.  Diabetes screening involves taking a blood sample to check your fasting blood sugar level. This should be done once every 3 years,   after age 45, if you are within normal weight and without risk factors for diabetes. Testing should be considered at a younger age or be carried out more frequently if you are overweight and have at least 1 risk factor for diabetes.  Colorectal cancer can be detected and often prevented. Most routine colorectal cancer screening begins at the age of 50 and continues through age 75. However, your caregiver may recommend screening at an earlier age if you  have risk factors for colon cancer. On a yearly basis, your caregiver may provide home test kits to check for hidden blood in the stool. Use of a small camera at the end of a tube, to directly examine the colon (sigmoidoscopy or colonoscopy), can detect the earliest forms of colorectal cancer. Talk to your caregiver about this at age 50, when routine screening begins. Direct examination of the colon should be repeated every 5 to 10 years through age 75, unless early forms of pre-cancerous polyps or small growths are found.  Hepatitis C blood testing is recommended for all people born from 1945 through 1965 and any individual with known risks for hepatitis C.  Practice safe sex. Use condoms and avoid high-risk sexual practices to reduce the spread of sexually transmitted infections (STIs). STIs include gonorrhea, chlamydia, syphilis, trichomonas, herpes, HPV, and human immunodeficiency virus (HIV). Herpes, HIV, and HPV are viral illnesses that have no cure. They can result in disability, cancer, and death.  A one-time screening for abdominal aortic aneurysm (AAA) and surgical repair of large AAAs by sound wave imaging (ultrasonography) is recommended for ages 65 to 75 years who are current or former smokers.  Healthy men should no longer receive prostate-specific antigen (PSA) blood tests as part of routine cancer screening. Consult with your caregiver about prostate cancer screening.  Testicular cancer screening is not recommended for adult males who have no symptoms. Screening includes self-exam, caregiver exam, and other screening tests. Consult with your caregiver about any symptoms you have or any concerns you have about testicular cancer.  Use sunscreen with skin protection factor (SPF) of 30 or more. Apply sunscreen liberally and repeatedly throughout the day. You should seek shade when your shadow is shorter than you. Protect yourself by wearing long sleeves, pants, a wide-brimmed hat, and  sunglasses year round, whenever you are outdoors.  Once a month, do a whole body skin exam, using a mirror to look at the skin on your back. Notify your caregiver of new moles, moles that have irregular borders, moles that are larger than a pencil eraser, or moles that have changed in shape or color.  Stay current with required immunizations.  Influenza. You need a dose every fall (or winter). The composition of the flu vaccine changes each year, so being vaccinated once is not enough.  Pneumococcal polysaccharide. You need 1 to 2 doses if you smoke cigarettes or if you have certain chronic medical conditions. You need 1 dose at age 65 (or older) if you have never been vaccinated.  Tetanus, diphtheria, pertussis (Tdap, Td). Get 1 dose of Tdap vaccine if you are younger than age 65 years, are over 65 and have contact with an infant, are a healthcare worker, or simply want to be protected from whooping cough. After that, you need a Td booster dose every 10 years. Consult your caregiver if you have not had at least 3 tetanus and diphtheria-containing shots sometime in your life or have a deep or dirty wound.  HPV. This vaccine is recommended   for males 13 through 50 years of age. This vaccine may be given to men 22 through 50 years of age who have not completed the 3 dose series. It is recommended for men through age 26 who have sex with men or whose immune system is weakened because of HIV infection, other illness, or medications. The vaccine is given in 3 doses over 6 months.  Measles, mumps, rubella (MMR). You need at least 1 dose of MMR if you were born in 1957 or later. You may also need a 2nd dose.  Meningococcal. If you are age 19 to 21 years and a first-year college student living in a residence hall, or have one of several medical conditions, you need to get vaccinated against meningococcal disease. You may also need additional booster doses.  Zoster (shingles). If you are age 60 years or  older, you should get this vaccine.  Varicella (chickenpox). If you have never had chickenpox or you were vaccinated but received only 1 dose, talk to your caregiver to find out if you need this vaccine.  Hepatitis A. You need this vaccine if you have a specific risk factor for hepatitis A virus infection, or you simply wish to be protected from this disease. The vaccine is usually given as 2 doses, 6 to 18 months apart.  Hepatitis B. You need this vaccine if you have a specific risk factor for hepatitis B virus infection or you simply wish to be protected from this disease. The vaccine is given in 3 doses, usually over 6 months. Preventative Service / Frequency Ages 19 to 39  Blood pressure check.** / Every 1 to 2 years.  Lipid and cholesterol check.** / Every 5 years beginning at age 20.  Hepatitis C blood test.** / For any individual with known risks for hepatitis C.  Skin self-exam. / Monthly.  Influenza immunization.** / Every year.  Pneumococcal polysaccharide immunization.** / 1 to 2 doses if you smoke cigarettes or if you have certain chronic medical conditions.  Tetanus, diphtheria, pertussis (Tdap,Td) immunization. / A one-time dose of Tdap vaccine. After that, you need a Td booster dose every 10 years.  HPV immunization. / 3 doses over 6 months, if 26 and younger.  Measles, mumps, rubella (MMR) immunization. / You need at least 1 dose of MMR if you were born in 1957 or later. You may also need a 2nd dose.  Meningococcal immunization. / 1 dose if you are age 19 to 21 years and a first-year college student living in a residence hall, or have one of several medical conditions, you need to get vaccinated against meningococcal disease. You may also need additional booster doses.  Varicella immunization.** / Consult your caregiver.  Hepatitis A immunization.** / Consult your caregiver. 2 doses, 6 to 18 months apart.  Hepatitis B immunization.** / Consult your caregiver. 3 doses  usually over 6 months. Ages 40 to 64  Blood pressure check.** / Every 1 to 2 years.  Lipid and cholesterol check.** / Every 5 years beginning at age 20.  Fecal occult blood test (FOBT) of stool. / Every year beginning at age 50 and continuing until age 75. You may not have to do this test if you get colonoscopy every 10 years.  Flexible sigmoidoscopy** or colonoscopy.** / Every 5 years for a flexible sigmoidoscopy or every 10 years for a colonoscopy beginning at age 50 and continuing until age 75.  Hepatitis C blood test.** / For all people born from 1945 through 1965 and any   individual with known risks for hepatitis C.  Skin self-exam. / Monthly.  Influenza immunization.** / Every year.  Pneumococcal polysaccharide immunization.** / 1 to 2 doses if you smoke cigarettes or if you have certain chronic medical conditions.  Tetanus, diphtheria, pertussis (Tdap/Td) immunization.** / A one-time dose of Tdap vaccine. After that, you need a Td booster dose every 10 years.  Measles, mumps, rubella (MMR) immunization. / You need at least 1 dose of MMR if you were born in 1957 or later. You may also need a 2nd dose.  Varicella immunization.**/ Consult your caregiver.  Meningococcal immunization.** / Consult your caregiver.  Hepatitis A immunization.** / Consult your caregiver. 2 doses, 6 to 18 months apart.  Hepatitis B immunization.** / Consult your caregiver. 3 doses, usually over 6 months. Ages 65 and over  Blood pressure check.** / Every 1 to 2 years.  Lipid and cholesterol check.**/ Every 5 years beginning at age 20.  Fecal occult blood test (FOBT) of stool. / Every year beginning at age 50 and continuing until age 75. You may not have to do this test if you get colonoscopy every 10 years.  Flexible sigmoidoscopy** or colonoscopy.** / Every 5 years for a flexible sigmoidoscopy or every 10 years for a colonoscopy beginning at age 50 and continuing until age 75.  Hepatitis C blood  test.** / For all people born from 1945 through 1965 and any individual with known risks for hepatitis C.  Abdominal aortic aneurysm (AAA) screening.** / A one-time screening for ages 65 to 75 years who are current or former smokers.  Skin self-exam. / Monthly.  Influenza immunization.** / Every year.  Pneumococcal polysaccharide immunization.** / 1 dose at age 65 (or older) if you have never been vaccinated.  Tetanus, diphtheria, pertussis (Tdap, Td) immunization. / A one-time dose of Tdap vaccine if you are over 65 and have contact with an infant, are a healthcare worker, or simply want to be protected from whooping cough. After that, you need a Td booster dose every 10 years.  Varicella immunization. ** / Consult your caregiver.  Meningococcal immunization.** / Consult your caregiver.  Hepatitis A immunization. ** / Consult your caregiver. 2 doses, 6 to 18 months apart.  Hepatitis B immunization.** / Check with your caregiver. 3 doses, usually over 6 months. **Family history and personal history of risk and conditions may change your caregiver's recommendations. Document Released: 06/15/2001 Document Revised: 07/12/2011 Document Reviewed: 09/14/2010 Exit[employer] Patient Information 2013 [employer].  

## 2012-03-08 LAB — TESTOSTERONE, FREE, TOTAL, SHBG
Sex Hormone Binding: 33 nmol/L (ref 13–71)
Testosterone, Free: 99.1 pg/mL (ref 47.0–244.0)
Testosterone-% Free: 2.1 % (ref 1.6–2.9)
Testosterone: 476.02 ng/dL (ref 300–890)

## 2012-03-08 LAB — URINE CULTURE
Colony Count: NO GROWTH
Organism ID, Bacteria: NO GROWTH

## 2012-03-09 ENCOUNTER — Ambulatory Visit: Payer: BC Managed Care – PPO | Admitting: Family Medicine

## 2012-03-09 ENCOUNTER — Ambulatory Visit: Payer: BC Managed Care – PPO

## 2012-03-09 VITALS — BP 124/80 | HR 87 | Temp 98.0°F | Resp 16 | Ht 71.0 in | Wt 240.8 lb

## 2012-03-09 DIAGNOSIS — M79609 Pain in unspecified limb: Secondary | ICD-10-CM

## 2012-03-09 DIAGNOSIS — W19XXXA Unspecified fall, initial encounter: Secondary | ICD-10-CM

## 2012-03-09 DIAGNOSIS — R0781 Pleurodynia: Secondary | ICD-10-CM

## 2012-03-09 DIAGNOSIS — S2239XA Fracture of one rib, unspecified side, initial encounter for closed fracture: Secondary | ICD-10-CM

## 2012-03-09 DIAGNOSIS — M79643 Pain in unspecified hand: Secondary | ICD-10-CM

## 2012-03-09 DIAGNOSIS — R079 Chest pain, unspecified: Secondary | ICD-10-CM

## 2012-03-09 MED ORDER — HYDROCODONE-ACETAMINOPHEN 5-500 MG PO TABS: 1.0000 | ORAL_TABLET | Freq: Four times a day (QID) | ORAL | Status: DC | PRN

## 2012-03-09 NOTE — Patient Instructions (Addendum)
You have a broken rib.  Take it easy, but be sure to take deep breaths several times a day to prevent pneumonia.  Use the pain medication as needed- you can also use an NSAID such as aleve or ibuprofen.    Take to your supervisor at work- you may want to use FMLA for this injury.    Let's check you back in 7 days to look at your fracture

## 2012-03-09 NOTE — Progress Notes (Signed)
[facility] [address] [place] [address] [phone]  Date:  03/09/2012   Name:  [name]   DOB:  09/21/1960   MRN:  [id number]  PCP:  Loreen Freud, DO    Chief Complaint: Rib Injury   History of Present Illness:  [name] is a 51 y.o. very pleasant male patient who presents with the following:  Yesterday around 4pm he fell while working around a pool- he fell from a 4 foot height (the pool deck) onto the ground.  He landed on his right side- he hurt his right ribs and his right ring finger. His finger may actually have been dislocated- it was pointed to the side-  but he pulled it back into poisiton.  He is now able to move the finger ok but it is sore.     It hurts to breathe and move.  As long as he holds perfectly still he is ok.  He is not having any abdominal pain or nausea/ vomiting No head injury or LOC.  He does not have any other injuries that he is aware of.  He has not taken any medications for his pain- just drank "a few beers" last night for his pain.    Patient Active Problem List  Diagnosis  . Preventative health care  . History of tobacco use  . OA (osteoarthritis)  . Fatigue    Past Medical History  Diagnosis Date  . Arthritis     Back  . Colon polyps     Past Surgical History  Procedure Date  . Eye surgery   . Shoulder surgery   . Neck surgery   . Spine surgery     History  Substance Use Topics  . Smoking status: Former Smoker -- 1.0 packs/day for 30 years    Types: Cigarettes    Quit date: 01/06/2012  . Smokeless tobacco: Former Neurosurgeon    Types: Snuff    Quit date: 01/06/2012     Comment: quit with chantix 1x but only stayed on it a month  . Alcohol Use: 12.0 oz/week    24 drink(s) per week    Family History  Problem Relation Age of Onset  . Cancer Father 69    Unknown  . Hypertension Mother   . Colon polyps Brother   . Aneurysm Maternal Aunt 58    brain  . Heart disease Maternal Aunt       d 49  . Cancer Paternal Uncle 74    stomach  . Aneurysm Maternal Grandfather     stomach  . Cancer Paternal Grandfather 66    stomach  . Depression Brother 20    suicide  . Cancer Paternal Uncle 54    stomach    Allergies  Allergen Reactions  . Cefuroxime Axetil     REACTION: hives    Medication list has been reviewed and updated.  Current Outpatient Prescriptions on File Prior to Visit  Medication Sig Dispense Refill  . meloxicam (MOBIC) 15 MG tablet 1 po qd prn  30 tablet  5    Review of Systems:  As per HPI- otherwise negative.   Physical Examination: Filed Vitals:   03/09/12 0816  BP: 124/80  Pulse: 87  Temp: 98 F (36.7 C)  Resp: 16   Filed Vitals:   03/09/12 0816  Height: 5\' 11"  (1.803 m)  Weight: 240 lb 12.8 oz (109.226 kg)   Body mass index is 33.58 kg/(m^2).  Ideal Body Weight: Weight in (lb) to have BMI = 25: 178.9   GEN: WDWN, NAD, Non-toxic, A & O x 3, overweight HEENT: Atraumatic, Normocephalic. Neck supple. No masses, No LAD. Ears and Nose: No external deformity. CV: RRR, No M/G/R. No JVD. No thrill. No extra heart sounds. PULM: CTA B, no wheezes, crackles, rhonchi. No retractions. No resp. distress. No accessory muscle use. ABD: S, NT, ND.  He is tender over his ribs but not over the abdomen Right hand: wrist and hand are ok, ring finger is bruised, slightly swollen and tender.  The joints appear to be located.  He has normal sensation and movement of the IP joints and MCP.  He has tenderness over his right lateral ribs, no bruising or laceration.  No evident abdominal injury.   EXTR: No c/c/e NEURO Normal gait.  PSYCH: Normally interactive. Conversant. Not depressed or anxious appearing.  Calm demeanor.   [facility] reading (PRIMARY) by  Dr. Patsy Lager. Right hand: no fracture or dslocation Right ribs/ AP chest: non- displaced fracture of 8th rib.  Lungs inflated, NAD  RIGHT HAND - COMPLETE 3+ VIEW  Comparison: None.  Findings: No acute  fracture or dislocation. Minimal osteoarthritis involving the interphalangeal joints. No soft tissue abnormalities or foreign body identified.  IMPRESSION: No acute injury identified  RIGHT RIBS AND CHEST - 3+ VIEW  Comparison: None.  Findings:  Normal cardiac silhouette and mediastinal contours. The lungs appear mildly hyperinflated with flattening of the bilateral hemidiaphragms. Minimal basilar opacities have to represent atelectasis. No focal airspace opacities. No definite pleural effusion or pneumothorax.  There is a minimally displaced fracture of the lateral aspect of the right eighth rib. Post lower cervical ACDF, incompletely imaged.  IMPRESSION: 1. Minimally displaced fracture lateral aspect of the right eighth rib. 2. Hyperexpanded lungs without acute cardiopulmonary disease.  Assessment and Plan: 1. Fall    2. Pain, hand  DG Hand Complete Right  3. Pain in rib  DG Ribs Unilateral W/Chest Right  4. Rib fracture  HYDROcodone-acetaminophen (VICODIN) 5-500 MG per tablet   See patient instructions for more details.   Warned him not to combine the hydrocodone with alcohol.  He will seek care right away if he develops any acute change or worsening of his rib pain.   Given fold- over splint for his finger- use for comfort and protection as needed.   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  He will discuss this with his supervisors- ?FMLA would be a good idea.    Abbe Amsterdam, MD

## 2012-03-15 ENCOUNTER — Telehealth: Payer: Self-pay

## 2012-03-15 NOTE — Telephone Encounter (Signed)
Work note ready for him to pick up. He advised he will pick up. Dr Patsy Lager advised he will not be able to come in for follow up visit.

## 2012-03-15 NOTE — Telephone Encounter (Signed)
Pt states that he will need additional days out of work because of not being able to lift anything due to his dislocated finger. Pt will need a work note indicating that pt does have a dislocated finger and will additional days out. Best#  [phone]

## 2012-03-15 NOTE — Telephone Encounter (Signed)
Patient requesting extra time off, how long do you want to extend work note? Please advise. He works at [employer], heavy labor.

## 2012-03-15 NOTE — Telephone Encounter (Signed)
Patient states he can not afford to come back in for follow up. He has a high deductible plan, and has to pay 30% of his visits

## 2012-03-15 NOTE — Telephone Encounter (Signed)
He may have more days off, that is ok.  Please remind him to come in for a recheck this week

## 2012-03-17 ENCOUNTER — Ambulatory Visit (INDEPENDENT_AMBULATORY_CARE_PROVIDER_SITE_OTHER): Payer: BC Managed Care – PPO | Admitting: General Surgery

## 2012-03-28 ENCOUNTER — Telehealth: Payer: Self-pay

## 2012-03-28 NOTE — Telephone Encounter (Signed)
Error

## 2012-03-31 ENCOUNTER — Telehealth: Payer: Self-pay

## 2012-03-31 NOTE — Telephone Encounter (Signed)
Pt LMOVM requesting CB no reason given. PLz advise pt CB# [phone]

## 2012-03-31 NOTE — Telephone Encounter (Signed)
Spoke with patient and he stated when he came in for his CPE he showed Dr.Lowne a cyst on his back and was told if it got infected to call back and let her know and she would call in an RX for an antibiotic until he can get it removed the first of the year. Patient uses [facility].  Please advise      KP

## 2012-04-02 NOTE — Telephone Encounter (Signed)
Doxycycline 100 mg 1 po bid for 10 days--  Ov if no better

## 2012-04-03 MED ORDER — DOXYCYCLINE HYCLATE 100 MG PO TABS: 100.0000 mg | ORAL_TABLET | Freq: Two times a day (BID) | ORAL | Status: DC

## 2012-05-04 ENCOUNTER — Ambulatory Visit (INDEPENDENT_AMBULATORY_CARE_PROVIDER_SITE_OTHER): Payer: Self-pay | Admitting: General Surgery

## 2012-12-01 ENCOUNTER — Other Ambulatory Visit: Payer: Self-pay | Admitting: *Deleted

## 2012-12-01 DIAGNOSIS — M1990 Unspecified osteoarthritis, unspecified site: Secondary | ICD-10-CM

## 2012-12-01 MED ORDER — MELOXICAM 15 MG PO TABS: ORAL_TABLET | ORAL | Status: DC

## 2012-12-01 NOTE — Telephone Encounter (Signed)
Rx was filled 12/01/12 for one month ov is over due.

## 2013-01-15 ENCOUNTER — Other Ambulatory Visit: Payer: Self-pay | Admitting: Family Medicine

## 2013-01-16 NOTE — Telephone Encounter (Signed)
Last seen 03/07/12 and filled 12/01/12 #30. Please advise      KP

## 2013-02-08 ENCOUNTER — Other Ambulatory Visit: Payer: Self-pay | Admitting: Family Medicine

## 2013-02-08 NOTE — Telephone Encounter (Signed)
Last seen 03/07/12 and filled 01/15/13 #30. Please advise     KP

## 2013-03-19 ENCOUNTER — Other Ambulatory Visit: Payer: Self-pay | Admitting: Family Medicine

## 2013-03-19 NOTE — Telephone Encounter (Signed)
Last seen 03/07/12 and filled 02/08/13 #30. Please advise      KP

## 2013-04-24 ENCOUNTER — Other Ambulatory Visit: Payer: Self-pay | Admitting: Family Medicine

## 2013-04-24 NOTE — Telephone Encounter (Signed)
Med filled, Pt mailed a letter to make OV.

## 2013-05-30 ENCOUNTER — Other Ambulatory Visit: Payer: Self-pay | Admitting: Family Medicine

## 2013-06-01 ENCOUNTER — Telehealth: Payer: Self-pay | Admitting: Family Medicine

## 2013-06-01 MED ORDER — MELOXICAM 15 MG PO TABS: ORAL_TABLET | ORAL | Status: DC

## 2013-06-01 NOTE — Telephone Encounter (Signed)
Patient's wife called stating pt needs rx for meloxicam. He has upcoming CPE in March. He initially requested this through the pharmacy, but it was refused.

## 2013-07-09 ENCOUNTER — Telehealth: Payer: Self-pay

## 2013-07-09 NOTE — Telephone Encounter (Signed)
Left message for call back Non-identifiable   Flu- Td- 05/03/08 CCS- 07/01/09- Benign polyps PSA- 03/07/12- 0.45

## 2013-07-09 NOTE — Telephone Encounter (Signed)
Medication List and allergies:  Updated and Reviewed  90 day supply/mail order: n/a Local prescriptions:  [id number] - [place], [place] - [address]  Immunization due:  Influenza-declined  A/P: No changes to personal, family or PSH Flu-declined Td-05/03/08 [facility]- 07/01/09- Benign polyps PSA- 03/07/12- 0.45  To discuss with provider: Mobic is not as effective as it once was for pain.

## 2013-07-12 ENCOUNTER — Encounter: Payer: Self-pay | Admitting: Family Medicine

## 2013-07-12 ENCOUNTER — Ambulatory Visit (INDEPENDENT_AMBULATORY_CARE_PROVIDER_SITE_OTHER): Payer: BC Managed Care – PPO | Admitting: Family Medicine

## 2013-07-12 VITALS — BP 132/72 | HR 90 | Temp 98.4°F | Ht 71.0 in | Wt 237.0 lb

## 2013-07-12 DIAGNOSIS — M25559 Pain in unspecified hip: Secondary | ICD-10-CM

## 2013-07-12 DIAGNOSIS — L723 Sebaceous cyst: Secondary | ICD-10-CM

## 2013-07-12 DIAGNOSIS — Z87891 Personal history of nicotine dependence: Secondary | ICD-10-CM

## 2013-07-12 DIAGNOSIS — Z6838 Body mass index (BMI) 38.0-38.9, adult: Secondary | ICD-10-CM | POA: Insufficient documentation

## 2013-07-12 DIAGNOSIS — E669 Obesity, unspecified: Secondary | ICD-10-CM | POA: Insufficient documentation

## 2013-07-12 DIAGNOSIS — M1990 Unspecified osteoarthritis, unspecified site: Secondary | ICD-10-CM

## 2013-07-12 DIAGNOSIS — Z0000 Encounter for general adult medical examination without abnormal findings: Secondary | ICD-10-CM

## 2013-07-12 LAB — BASIC METABOLIC PANEL
BUN: 14 mg/dL (ref 6–23)
CO2: 26 mEq/L (ref 19–32)
Calcium: 9.5 mg/dL (ref 8.4–10.5)
Chloride: 108 mEq/L (ref 96–112)
Creatinine, Ser: 0.8 mg/dL (ref 0.4–1.5)
GFR: 108.05 mL/min (ref >60.00)
Glucose, Bld: 98 mg/dL (ref 70–99)
Potassium: 4.7 mEq/L (ref 3.5–5.1)
Sodium: 140 mEq/L (ref 135–145)

## 2013-07-12 LAB — PSA: PSA: 0.62 ng/mL (ref 0.10–4.00)

## 2013-07-12 LAB — CBC WITH DIFFERENTIAL/PLATELET
Basophils Absolute: 0 10*3/uL (ref 0.0–0.1)
Basophils Relative: 0.5 % (ref 0.0–3.0)
Eosinophils Absolute: 0.2 10*3/uL (ref 0.0–0.7)
Eosinophils Relative: 2 % (ref 0.0–5.0)
HCT: 45.7 % (ref 39.0–52.0)
Hemoglobin: 15.5 g/dL (ref 13.0–17.0)
Lymphocytes Relative: 26.3 % (ref 12.0–46.0)
Lymphs Abs: 2.2 10*3/uL (ref 0.7–4.0)
MCHC: 34 g/dL (ref 30.0–36.0)
MCV: 93.2 fl (ref 78.0–100.0)
Monocytes Absolute: 0.6 10*3/uL (ref 0.1–1.0)
Monocytes Relative: 7.1 % (ref 3.0–12.0)
Neutro Abs: 5.4 10*3/uL (ref 1.4–7.7)
Neutrophils Relative %: 64.1 % (ref 43.0–77.0)
Platelets: 250 10*3/uL (ref 150.0–400.0)
RBC: 4.9 Mil/uL (ref 4.22–5.81)
RDW: 14 % (ref 11.5–14.6)
WBC: 8.5 10*3/uL (ref 4.5–10.5)

## 2013-07-12 LAB — LIPID PANEL
Cholesterol: 205 mg/dL — ABNORMAL HIGH (ref 0–200)
HDL: 57.3 mg/dL (ref >39.00)
LDL Cholesterol: 137 mg/dL — ABNORMAL HIGH (ref 0–99)
Total CHOL/HDL Ratio: 4
Triglycerides: 54 mg/dL (ref 0.0–149.0)
VLDL: 10.8 mg/dL (ref 0.0–40.0)

## 2013-07-12 LAB — POCT URINALYSIS DIPSTICK
Bilirubin, UA: NEGATIVE
Glucose, UA: NEGATIVE
Ketones, UA: NEGATIVE
Leukocytes, UA: NEGATIVE
Nitrite, UA: NEGATIVE
Protein, UA: NEGATIVE
Spec Grav, UA: 1.015
Urobilinogen, UA: 0.2
pH, UA: 6

## 2013-07-12 LAB — HEPATIC FUNCTION PANEL
ALT: 20 U/L (ref 0–53)
AST: 16 U/L (ref 0–37)
Albumin: 4.5 g/dL (ref 3.5–5.2)
Alkaline Phosphatase: 58 U/L (ref 39–117)
Bilirubin, Direct: 0.1 mg/dL (ref 0.0–0.3)
Total Bilirubin: 0.8 mg/dL (ref 0.3–1.2)
Total Protein: 7 g/dL (ref 6.0–8.3)

## 2013-07-12 LAB — TSH: TSH: 1.74 u[IU]/mL (ref 0.35–5.50)

## 2013-07-12 MED ORDER — CELECOXIB 200 MG PO CAPS
200.0000 mg | ORAL_CAPSULE | Freq: Two times a day (BID) | ORAL | Status: DC
Start: 2013-07-12 — End: 2014-02-15

## 2013-07-12 NOTE — Progress Notes (Signed)
Patient ID: [name], male   DOB: 1960-11-15, 53 y.o.   MRN: [id number]   Subjective:    Patient ID: [name], male    DOB: 11-22-1960, 52 y.o.   MRN: [id number] HPI Pt is here for cpe and labs.  He c/o hip pain x the last several weeks.  No known injury. He is also c/o cyst on his back returning---drained and painful recently.    Past Medical History  Diagnosis Date  . Arthritis     Back  . Colon polyps    History   Social History  . Marital Status: Married    Spouse Name: N/A    Number of Children: N/A  . Years of Education: N/A   Occupational History  . [employer]   Social History Main Topics  . Smoking status: Former Smoker -- 1.00 packs/day for 30 years    Types: Cigarettes    Quit date: 01/06/2012  . Smokeless tobacco: Former Systems developer    Types: Snuff    Quit date: 01/06/2012     Comment: quit with chantix 1x but only stayed on it a month  . Alcohol Use: 12.0 oz/week    24 drink(s) per week  . Drug Use: No  . Sexual Activity: Yes    Partners: Female   Other Topics Concern  . Not on file   Social History Narrative   Exercise--no   Family History  Problem Relation Age of Onset  . Cancer Father 15    Unknown  . Hypertension Mother   . Colon polyps Brother   . Aneurysm Maternal Aunt 58    brain  . Heart disease Maternal Aunt     d 16  . Cancer Paternal Uncle 74    stomach  . Aneurysm Maternal Grandfather     stomach  . Cancer Paternal Grandfather 32    stomach  . Depression Brother 20    suicide  . Cancer Paternal Uncle 35    stomach   No current outpatient prescriptions on file prior to visit.   No current facility-administered medications on file prior to visit.   Past Surgical History  Procedure Laterality Date  . Eye surgery    . Shoulder surgery    . Neck surgery    . Spine surgery            Objective:     BP 132/72  Pulse 90  Temp(Src) 98.4 F (36.9 C) (Oral)  Ht 5\' 11"  (1.803 m)  Wt 237  lb (107.502 kg)  BMI 33.07 kg/m2  SpO2 98%  General Appearance:    Alert, cooperative, no distress, appears stated age  Head:    Normocephalic, without obvious abnormality, atraumatic  Eyes:    PERRL, conjunctiva/corneas clear, EOM's intact, fundi    benign, both eyes       Ears:    Normal TM's and external ear canals, both ears  Nose:   Nares normal, septum midline, mucosa normal, no drainage   or sinus tenderness  Throat:   Lips, mucosa, and tongue normal; teeth and gums normal  Neck:   Supple, symmetrical, trachea midline, no adenopathy;       thyroid:  No enlargement/tenderness/nodules; no carotid   bruit or JVD  Back:     Symmetric, no curvature, ROM normal, no CVA tenderness  Lungs:     Clear to auscultation bilaterally, respirations unlabored  Chest wall:    No tenderness or deformity  Heart:  Regular rate and rhythm, S1 and S2 normal, no murmur, rub   or gallop  Abdomen:     Soft, non-tender, bowel sounds active all four quadrants,    no masses, no organomegaly  Genitalia:    Normal male without lesion, discharge or tenderness  Rectal:    Normal tone, normal prostate, no masses or tenderness;   guaiac negative stool  Extremities:   Extremities normal, atraumatic, no cyanosis or edema  Pulses:   2+ and symmetric all extremities  Skin:   Skin color, texture, turgor normal, no rashes or lesions  Lymph nodes:   Cervical, supraclavicular, and axillary nodes normal  Neurologic:   CNII-XII intact. Normal strength, sensation and reflexes      throughout        Assessment & Plan:  1. Hip pain   celebrex prn - AMB referral to orthopedics  2. OA (osteoarthritis)  - celecoxib (CELEBREX) 200 MG capsule; Take 1 capsule (200 mg total) by mouth 2 (two) times daily.  Dispense: 30 capsule; Refill: 2  3. Preventative health care ghm utd Check labs - Basic metabolic panel - CBC with Differential - Hepatic function panel - Lipid panel - PSA - TSH - POCT urinalysis  dipstick  4. Sebaceous cyst  - Ambulatory referral to General Surgery  5. History of tobacco use

## 2013-07-12 NOTE — Patient Instructions (Signed)
Preventive Care for Adults, Male A healthy lifestyle and preventive care can promote health and wellness. Preventive health guidelines for men include the following key practices:  A routine yearly physical is a good way to check with your health care provider about your health and preventative screening. It is a chance to share any concerns and updates on your health and to receive a thorough exam.  Visit your dentist for a routine exam and preventative care every 6 months. Brush your teeth twice a day and floss once a day. Good oral hygiene prevents tooth decay and gum disease.  The frequency of eye exams is based on your age, health, family medical history, use of contact lenses, and other factors. Follow your health care provider's recommendations for frequency of eye exams.  Eat a healthy diet. Foods such as vegetables, fruits, whole grains, low-fat dairy products, and lean protein foods contain the nutrients you need without too many calories. Decrease your intake of foods high in solid fats, added sugars, and salt. Eat the right amount of calories for you.Get information about a proper diet from your health care provider, if necessary.  Regular physical exercise is one of the most important things you can do for your health. Most adults should get at least 150 minutes of moderate-intensity exercise (any activity that increases your heart rate and causes you to sweat) each week. In addition, most adults need muscle-strengthening exercises on 2 or more days a week.  Maintain a healthy weight. The body mass index (BMI) is a screening tool to identify possible weight problems. It provides an estimate of body fat based on height and weight. Your health care provider can find your BMI and can help you achieve or maintain a healthy weight.For adults 20 years and older:  A BMI below 18.5 is considered underweight.  A BMI of 18.5 to 24.9 is normal.  A BMI of 25 to 29.9 is considered  overweight.  A BMI of 30 and above is considered obese.  Maintain normal blood lipids and cholesterol levels by exercising and minimizing your intake of saturated fat. Eat a balanced diet with plenty of fruit and vegetables. Blood tests for lipids and cholesterol should begin at age 42 and be repeated every 5 years. If your lipid or cholesterol levels are high, you are over 50, or you are at high risk for heart disease, you may need your cholesterol levels checked more frequently.Ongoing high lipid and cholesterol levels should be treated with medicines if diet and exercise are not working.  If you smoke, find out from your health care provider how to quit. If you do not use tobacco, do not start.  Lung cancer screening is recommended for adults aged 24 80 years who are at high risk for developing lung cancer because of a history of smoking. A yearly low-dose CT scan of the lungs is recommended for people who have at least a 30-pack-year history of smoking and are a current smoker or have quit within the past 15 years. A pack year of smoking is smoking an average of 1 pack of cigarettes a day for 1 year (for example: 1 pack a day for 30 years or 2 packs a day for 15 years). Yearly screening should continue until the smoker has stopped smoking for at least 15 years. Yearly screening should be stopped for people who develop a health problem that would prevent them from having lung cancer treatment.  If you choose to drink alcohol, do not have  more than 2 drinks per day. One drink is considered to be 12 ounces (355 mL) of beer, 5 ounces (148 mL) of wine, or 1.5 ounces (44 mL) of liquor.  Avoid use of street drugs. Do not share needles with anyone. Ask for help if you need support or instructions about stopping the use of drugs.  High blood pressure causes heart disease and increases the risk of stroke. Your blood pressure should be checked at least every 1 2 years. Ongoing high blood pressure should be  treated with medicines, if weight loss and exercise are not effective.  If you are 75 52 years old, ask your health care provider if you should take aspirin to prevent heart disease.  Diabetes screening involves taking a blood sample to check your fasting blood sugar level. This should be done once every 3 years, after age 19, if you are within normal weight and without risk factors for diabetes. Testing should be considered at a younger age or be carried out more frequently if you are overweight and have at least 1 risk factor for diabetes.  Colorectal cancer can be detected and often prevented. Most routine colorectal cancer screening begins at the age of 47 and continues through age 80. However, your health care provider may recommend screening at an earlier age if you have risk factors for colon cancer. On a yearly basis, your health care provider may provide home test kits to check for hidden blood in the stool. Use of a small camera at the end of a tube to directly examine the colon (sigmoidoscopy or colonoscopy) can detect the earliest forms of colorectal cancer. Talk to your health care provider about this at age 66, when routine screening begins. Direct exam of the colon should be repeated every 5 10 years through age 19, unless early forms of precancerous polyps or small growths are found.  People who are at an increased risk for hepatitis B should be screened for this virus. You are considered at high risk for hepatitis B if:  You were born in a country where hepatitis B occurs often. Talk with your health care provider about which countries are considered high-risk.  Your parents were born in a high-risk country and you have not received a shot to protect against hepatitis B (hepatitis B vaccine).  You have HIV or AIDS.  You use needles to inject street drugs.  You live with, or have sex with, someone who has hepatitis B.  You are a man who has sex with other men (MSM).  You get  hemodialysis treatment.  You take certain medicines for conditions such as cancer, organ transplantation, and autoimmune conditions.  Hepatitis C blood testing is recommended for all people born from [birth year] through 1965 and any individual with known risks for hepatitis C.  Practice safe sex. Use condoms and avoid high-risk sexual practices to reduce the spread of sexually transmitted infections (STIs). STIs include gonorrhea, chlamydia, syphilis, trichomonas, herpes, HPV, and human immunodeficiency virus (HIV). Herpes, HIV, and HPV are viral illnesses that have no cure. They can result in disability, cancer, and death.  A one-time screening for abdominal aortic aneurysm (AAA) and surgical repair of large AAAs by ultrasound are recommended for men ages 94 to 74 years who are current or former smokers.  Healthy men should no longer receive prostate-specific antigen (PSA) blood tests as part of routine cancer screening. Talk with your health care provider about prostate cancer screening.  Testicular cancer screening is not recommended  for adult males who have no symptoms. Screening includes self-exam, a health care provider exam, and other screening tests. Consult with your health care provider about any symptoms you have or any concerns you have about testicular cancer.  Use sunscreen. Apply sunscreen liberally and repeatedly throughout the day. You should seek shade when your shadow is shorter than you. Protect yourself by wearing long sleeves, pants, a wide-brimmed hat, and sunglasses year round, whenever you are outdoors.  Once a month, do a whole-body skin exam, using a mirror to look at the skin on your back. Tell your health care provider about new moles, moles that have irregular borders, moles that are larger than a pencil eraser, or moles that have changed in shape or color.  Stay current with required vaccines (immunizations).  Influenza vaccine. All adults should be immunized every  year.  Tetanus, diphtheria, and acellular pertussis (Td, Tdap) vaccine. An adult who has not previously received Tdap or who does not know his vaccine status should receive 1 dose of Tdap. This initial dose should be followed by tetanus and diphtheria toxoids (Td) booster doses every 10 years. Adults with an unknown or incomplete history of completing a 3-dose immunization series with Td-containing vaccines should begin or complete a primary immunization series including a Tdap dose. Adults should receive a Td booster every 10 years.  Varicella vaccine. An adult without evidence of immunity to varicella should receive 2 doses or a second dose if he has previously received 1 dose.  Human papillomavirus (HPV) vaccine. Males aged 44 21 years who have not received the vaccine previously should receive the 3-dose series. Males aged 43 26 years may be immunized. Immunization is recommended through the age of 50 years for any male who has sex with males and did not get any or all doses earlier. Immunization is recommended for any person with an immunocompromised condition through the age of 23 years if he did not get any or all doses earlier. During the 3-dose series, the second dose should be obtained 4 8 weeks after the first dose. The third dose should be obtained 24 weeks after the first dose and 16 weeks after the second dose.  Zoster vaccine. One dose is recommended for adults aged 96 years or older unless certain conditions are present.  Measles, mumps, and rubella (MMR) vaccine. Adults born before 55 generally are considered immune to measles and mumps. Adults born in [birth year] or later should have 1 or more doses of MMR vaccine unless there is a contraindication to the vaccine or there is laboratory evidence of immunity to each of the three diseases. A routine second dose of MMR vaccine should be obtained at least 28 days after the first dose for students attending postsecondary schools, health care  workers, or international travelers. People who received inactivated measles vaccine or an unknown type of measles vaccine during 1963 1967 should receive 2 doses of MMR vaccine. People who received inactivated mumps vaccine or an unknown type of mumps vaccine before 1979 and are at high risk for mumps infection should consider immunization with 2 doses of MMR vaccine. Unvaccinated health care workers born before [birth year] who lack laboratory evidence of measles, mumps, or rubella immunity or laboratory confirmation of disease should consider measles and mumps immunization with 2 doses of MMR vaccine or rubella immunization with 1 dose of MMR vaccine.  Pneumococcal 13-valent conjugate (PCV13) vaccine. When indicated, a person who is uncertain of his immunization history and has no record of immunization  should receive the PCV13 vaccine. An adult aged 67 years or older who has certain medical conditions and has not been previously immunized should receive 1 dose of PCV13 vaccine. This PCV13 should be followed with a dose of pneumococcal polysaccharide (PPSV23) vaccine. The PPSV23 vaccine dose should be obtained at least 8 weeks after the dose of PCV13 vaccine. An adult aged 79 years or older who has certain medical conditions and previously received 1 or more doses of PPSV23 vaccine should receive 1 dose of PCV13. The PCV13 vaccine dose should be obtained 1 or more years after the last PPSV23 vaccine dose.  Pneumococcal polysaccharide (PPSV23) vaccine. When PCV13 is also indicated, PCV13 should be obtained first. All adults aged 74 years and older should be immunized. An adult younger than age 50 years who has certain medical conditions should be immunized. Any person who resides in a nursing home or long-term care facility should be immunized. An adult smoker should be immunized. People with an immunocompromised condition and certain other conditions should receive both PCV13 and PPSV23 vaccines. People with human  immunodeficiency virus (HIV) infection should be immunized as soon as possible after diagnosis. Immunization during chemotherapy or radiation therapy should be avoided. Routine use of PPSV23 vaccine is not recommended for American Indians, [place] Natives, or people younger than 65 years unless there are medical conditions that require PPSV23 vaccine. When indicated, people who have unknown immunization and have no record of immunization should receive PPSV23 vaccine. One-time revaccination 5 years after the first dose of PPSV23 is recommended for people aged 41 64 years who have chronic kidney failure, nephrotic syndrome, asplenia, or immunocompromised conditions. People who received 1 2 doses of PPSV23 before age 15 years should receive another dose of PPSV23 vaccine at age 48 years or later if at least 5 years have passed since the previous dose. Doses of PPSV23 are not needed for people immunized with PPSV23 at or after age 69 years.  Meningococcal vaccine. Adults with asplenia or persistent complement component deficiencies should receive 2 doses of quadrivalent meningococcal conjugate (MenACWY-D) vaccine. The doses should be obtained at least 2 months apart. Microbiologists working with certain meningococcal bacteria, [place] recruits, people at risk during an outbreak, and people who travel to or live in countries with a high rate of meningitis should be immunized. A first-year college student up through age 7 years who is living in a residence hall should receive a dose if he did not receive a dose on or after his 16th birthday. Adults who have certain high-risk conditions should receive one or more doses of vaccine.  Hepatitis A vaccine. Adults who wish to be protected from this disease, have certain high-risk conditions, work with hepatitis A-infected animals, work in hepatitis A research labs, or travel to or work in countries with a high rate of hepatitis A should be immunized. Adults who were  previously unvaccinated and who anticipate close contact with an international adoptee during the first 60 days after arrival in the [place] from a country with a high rate of hepatitis A should be immunized.  Hepatitis B vaccine. Adults who wish to be protected from this disease, have certain high-risk conditions, may be exposed to blood or other infectious body fluids, are household contacts or sex partners of hepatitis B positive people, are clients or workers in certain care facilities, or travel to or work in countries with a high rate of hepatitis B should be immunized.  Haemophilus influenzae type b (Hib) vaccine. A  previously unvaccinated person with asplenia or sickle cell disease or having a scheduled splenectomy should receive 1 dose of Hib vaccine. Regardless of previous immunization, a recipient of a hematopoietic stem cell transplant should receive a 3-dose series 6 12 months after his successful transplant. Hib vaccine is not recommended for adults with HIV infection. Preventive Service / Frequency Ages 62 to 3  Blood pressure check.** / Every 1 to 2 years.  Lipid and cholesterol check.** / Every 5 years beginning at age 43.  Hepatitis C blood test.** / For any individual with known risks for hepatitis C.  Skin self-exam. / Monthly.  Influenza vaccine. / Every year.  Tetanus, diphtheria, and acellular pertussis (Tdap, Td) vaccine.** / Consult your health care provider. 1 dose of Td every 10 years.  Varicella vaccine.** / Consult your health care provider.  HPV vaccine. / 3 doses over 6 months, if 48 or younger.  Measles, mumps, rubella (MMR) vaccine.** / You need at least 1 dose of MMR if you were born in 1957 or later. You may also need a second dose.  Pneumococcal 13-valent conjugate (PCV13) vaccine.** / Consult your health care provider.  Pneumococcal polysaccharide (PPSV23) vaccine.** / 1 to 2 doses if you smoke cigarettes or if you have certain  conditions.  Meningococcal vaccine.** / 1 dose if you are age 8 to 70 years and a Market researcher living in a residence hall, or have one of several medical conditions. You may also need additional booster doses.  Hepatitis A vaccine.** / Consult your health care provider.  Hepatitis B vaccine.** / Consult your health care provider.  Haemophilus influenzae type b (Hib) vaccine.** / Consult your health care provider. Ages 48 to 32  Blood pressure check.** / Every 1 to 2 years.  Lipid and cholesterol check.** / Every 5 years beginning at age 38.  Lung cancer screening. / Every year if you are aged 40 80 years and have a 30-pack-year history of smoking and currently smoke or have quit within the past 15 years. Yearly screening is stopped once you have quit smoking for at least 15 years or develop a health problem that would prevent you from having lung cancer treatment.  Fecal occult blood test (FOBT) of stool. / Every year beginning at age 4 and continuing until age 70. You may not have to do this test if you get a colonoscopy every 10 years.  Flexible sigmoidoscopy** or colonoscopy.** / Every 5 years for a flexible sigmoidoscopy or every 10 years for a colonoscopy beginning at age 76 and continuing until age 62.  Hepatitis C blood test.** / For all people born from [birth year] through 1965 and any individual with known risks for hepatitis C.  Skin self-exam. / Monthly.  Influenza vaccine. / Every year.  Tetanus, diphtheria, and acellular pertussis (Tdap/Td) vaccine.** / Consult your health care provider. 1 dose of Td every 10 years.  Varicella vaccine.** / Consult your health care provider.  Zoster vaccine.** / 1 dose for adults aged 60 years or older.  Measles, mumps, rubella (MMR) vaccine.** / You need at least 1 dose of MMR if you were born in 1957 or later. You may also need a second dose.  Pneumococcal 13-valent conjugate (PCV13) vaccine.** / Consult your health care  provider.  Pneumococcal polysaccharide (PPSV23) vaccine.** / 1 to 2 doses if you smoke cigarettes or if you have certain conditions.  Meningococcal vaccine.** / Consult your health care provider.  Hepatitis A vaccine.** / Consult your health care  provider.  Hepatitis B vaccine.** / Consult your health care provider.  Haemophilus influenzae type b (Hib) vaccine.** / Consult your health care provider. Ages 65 and over  Blood pressure check.** / Every 1 to 2 years.  Lipid and cholesterol check.**/ Every 5 years beginning at age 20.  Lung cancer screening. / Every year if you are aged 55 80 years and have a 30-pack-year history of smoking and currently smoke or have quit within the past 15 years. Yearly screening is stopped once you have quit smoking for at least 15 years or develop a health problem that would prevent you from having lung cancer treatment.  Fecal occult blood test (FOBT) of stool. / Every year beginning at age 50 and continuing until age 75. You may not have to do this test if you get a colonoscopy every 10 years.  Flexible sigmoidoscopy** or colonoscopy.** / Every 5 years for a flexible sigmoidoscopy or every 10 years for a colonoscopy beginning at age 50 and continuing until age 75.  Hepatitis C blood test.** / For all people born from 1945 through 1965 and any individual with known risks for hepatitis C.  Abdominal aortic aneurysm (AAA) screening.** / A one-time screening for ages 65 to 75 years who are current or former smokers.  Skin self-exam. / Monthly.  Influenza vaccine. / Every year.  Tetanus, diphtheria, and acellular pertussis (Tdap/Td) vaccine.** / 1 dose of Td every 10 years.  Varicella vaccine.** / Consult your health care provider.  Zoster vaccine.** / 1 dose for adults aged 60 years or older.  Pneumococcal 13-valent conjugate (PCV13) vaccine.** / Consult your health care provider.  Pneumococcal polysaccharide (PPSV23) vaccine.** / 1 dose for all  adults aged 65 years and older.  Meningococcal vaccine.** / Consult your health care provider.  Hepatitis A vaccine.** / Consult your health care provider.  Hepatitis B vaccine.** / Consult your health care provider.  Haemophilus influenzae type b (Hib) vaccine.** / Consult your health care provider. **Family history and personal history of risk and conditions may change your health care provider's recommendations. Document Released: 06/15/2001 Document Revised: 02/07/2013 Document Reviewed: 09/14/2010 [employer] Patient Information 2014 [employer].  

## 2013-07-12 NOTE — Progress Notes (Signed)
Pre visit review using our clinic review tool, if applicable. No additional management support is needed unless otherwise documented below in the visit note. 

## 2013-07-19 ENCOUNTER — Encounter (INDEPENDENT_AMBULATORY_CARE_PROVIDER_SITE_OTHER): Payer: Self-pay | Admitting: General Surgery

## 2013-07-19 ENCOUNTER — Ambulatory Visit (INDEPENDENT_AMBULATORY_CARE_PROVIDER_SITE_OTHER): Payer: BC Managed Care – PPO | Admitting: General Surgery

## 2013-07-19 ENCOUNTER — Telehealth (INDEPENDENT_AMBULATORY_CARE_PROVIDER_SITE_OTHER): Payer: Self-pay | Admitting: General Surgery

## 2013-07-19 VITALS — BP 136/80 | HR 75 | Temp 98.4°F | Resp 14 | Ht 72.0 in | Wt 233.0 lb

## 2013-07-19 DIAGNOSIS — L723 Sebaceous cyst: Secondary | ICD-10-CM

## 2013-07-19 NOTE — Telephone Encounter (Signed)
Patient met with surgery schedulers given financial responsibilities, patient will call back to schedule °

## 2013-07-19 NOTE — Progress Notes (Signed)
Patient ID: [name], male   DOB: 07-May-1960, 53 y.o.   MRN: [id number]  Chief Complaint  Patient presents with  . Cyst    new pt    HPI [name] is a 53 y.o. male.  The patient is a 53 year old male who is referred by Dr. Etter Sjogren for evaluation of a back sebaceous cyst. This states he's had several positive for 5 years. He states that several years ago it was drained however has recurred since that time. Patient states it has been infected on the off since recurrence.  HPI  Past Medical History  Diagnosis Date  . Arthritis     Back  . Colon polyps     Past Surgical History  Procedure Laterality Date  . Eye surgery    . Shoulder surgery    . Neck surgery    . Spine surgery      Family History  Problem Relation Age of Onset  . Cancer Father 43    Unknown  . Hypertension Mother   . Colon polyps Brother   . Aneurysm Maternal Aunt 58    brain  . Heart disease Maternal Aunt     d 34  . Cancer Paternal Uncle 66    stomach  . Aneurysm Maternal Grandfather     stomach  . Cancer Paternal Grandfather 66    stomach  . Depression Brother 20    suicide  . Cancer Paternal Uncle 18    stomach    Social History History  Substance Use Topics  . Smoking status: Former Smoker -- 1.00 packs/day for 30 years    Types: Cigarettes    Quit date: 01/06/2012  . Smokeless tobacco: Former Systems developer    Types: Snuff    Quit date: 01/06/2012     Comment: quit with chantix 1x but only stayed on it a month  . Alcohol Use: 12.0 oz/week    24 drink(s) per week    Allergies  Allergen Reactions  . Cefuroxime Axetil     REACTION: hives    Current Outpatient Prescriptions  Medication Sig Dispense Refill  . celecoxib (CELEBREX) 200 MG capsule Take 1 capsule (200 mg total) by mouth 2 (two) times daily.  30 capsule  2   No current facility-administered medications for this visit.    Review of Systems Review of Systems  Constitutional: Negative.   HENT: Negative.     Eyes: Negative.   Respiratory: Negative.   Cardiovascular: Negative.   Gastrointestinal: Negative.   Endocrine: Negative.   Neurological: Negative.     Blood pressure 136/80, pulse 75, temperature 98.4 F (36.9 C), temperature source Oral, resp. rate 14, height 6' (1.829 m), weight 233 lb (105.688 kg).  Physical Exam Physical Exam  Constitutional: He is oriented to person, place, and time. He appears well-developed and well-nourished.  HENT:  Head: Normocephalic and atraumatic.  Eyes: Conjunctivae and EOM are normal. Pupils are equal, round, and reactive to light.  Neck: Normal range of motion. Neck supple.  Cardiovascular: Normal rate, regular rhythm and normal heart sounds.   Pulmonary/Chest: Effort normal and breath sounds normal.  Abdominal: Soft. Bowel sounds are normal. He exhibits no distension and no mass. There is no tenderness. There is no rebound and no guarding.  Musculoskeletal: Normal range of motion.  Neurological: He is alert and oriented to person, place, and time.  Skin: Skin is warm and dry.       Data Reviewed none  Assessment  53 year old male with a mid back sebaceous cyst     Plan    1. We'll proceed to the operating room for an excision of a sebaceous cyst. 2. Discussed with them the risks and benefits of the procedure to include but not limited to infection, bleeding, damage to surrounding structures, possible wound healing complications. The patient voiced understanding and wishes to proceed.        Richard Jacks., [name] 07/19/2013, 10:57 AM

## 2013-11-13 DIAGNOSIS — M25559 Pain in unspecified hip: Secondary | ICD-10-CM | POA: Insufficient documentation

## 2014-01-02 ENCOUNTER — Other Ambulatory Visit: Payer: Self-pay | Admitting: Neurological Surgery

## 2014-01-02 DIAGNOSIS — M47817 Spondylosis without myelopathy or radiculopathy, lumbosacral region: Secondary | ICD-10-CM

## 2014-01-09 ENCOUNTER — Ambulatory Visit
Admission: RE | Admit: 2014-01-09 | Discharge: 2014-01-09 | Disposition: A | Discharge status: Home / Self Care | Payer: BC Managed Care – PPO | Source: Ambulatory Visit | Attending: Neurological Surgery | Admitting: Neurological Surgery

## 2014-01-09 DIAGNOSIS — M47817 Spondylosis without myelopathy or radiculopathy, lumbosacral region: Secondary | ICD-10-CM

## 2014-02-07 ENCOUNTER — Encounter (HOSPITAL_COMMUNITY): Payer: Self-pay | Admitting: Pharmacy Technician

## 2014-02-07 ENCOUNTER — Other Ambulatory Visit: Payer: Self-pay | Admitting: Neurological Surgery

## 2014-02-08 ENCOUNTER — Encounter (HOSPITAL_COMMUNITY): Payer: Self-pay

## 2014-02-08 ENCOUNTER — Encounter (HOSPITAL_COMMUNITY)
Admission: RE | Admit: 2014-02-08 | Discharge: 2014-02-08 | Disposition: A | Discharge status: Home / Self Care | Payer: BC Managed Care – PPO | Source: Ambulatory Visit | Attending: Neurological Surgery | Admitting: Neurological Surgery

## 2014-02-08 DIAGNOSIS — R5383 Other fatigue: Secondary | ICD-10-CM | POA: Diagnosis not present

## 2014-02-08 DIAGNOSIS — K219 Gastro-esophageal reflux disease without esophagitis: Secondary | ICD-10-CM | POA: Diagnosis not present

## 2014-02-08 DIAGNOSIS — M47896 Other spondylosis, lumbar region: Secondary | ICD-10-CM | POA: Diagnosis not present

## 2014-02-08 DIAGNOSIS — M4680 Other specified inflammatory spondylopathies, site unspecified: Secondary | ICD-10-CM | POA: Diagnosis not present

## 2014-02-08 DIAGNOSIS — Z0000 Encounter for general adult medical examination without abnormal findings: Secondary | ICD-10-CM | POA: Insufficient documentation

## 2014-02-08 DIAGNOSIS — Z6833 Body mass index (BMI) 33.0-33.9, adult: Secondary | ICD-10-CM | POA: Diagnosis not present

## 2014-02-08 DIAGNOSIS — E669 Obesity, unspecified: Secondary | ICD-10-CM | POA: Insufficient documentation

## 2014-02-08 HISTORY — DX: Gastro-esophageal reflux disease without esophagitis: K21.9

## 2014-02-08 LAB — BASIC METABOLIC PANEL
Anion gap: 11 (ref 5–15)
BUN: 13 mg/dL (ref 6–23)
CO2: 26 mEq/L (ref 19–32)
Calcium: 9.5 mg/dL (ref 8.4–10.5)
Chloride: 103 mEq/L (ref 96–112)
Creatinine, Ser: 0.85 mg/dL (ref 0.50–1.35)
GFR calc Af Amer: >90 mL/min (ref >90)
GFR calc non Af Amer: >90 mL/min (ref >90)
Glucose, Bld: 97 mg/dL (ref 70–99)
Potassium: 5.7 mEq/L — ABNORMAL HIGH (ref 3.7–5.3)
Sodium: 140 mEq/L (ref 137–147)

## 2014-02-08 LAB — CBC
HCT: 47.2 % (ref 39.0–52.0)
Hemoglobin: 16.1 g/dL (ref 13.0–17.0)
MCH: 31.7 pg (ref 26.0–34.0)
MCHC: 34.1 g/dL (ref 30.0–36.0)
MCV: 92.9 fL (ref 78.0–100.0)
Platelets: 237 10*3/uL (ref 150–400)
RBC: 5.08 MIL/uL (ref 4.22–5.81)
RDW: 13.2 % (ref 11.5–15.5)
WBC: 9.9 10*3/uL (ref 4.0–10.5)

## 2014-02-08 LAB — SURGICAL PCR SCREEN
MRSA, PCR: NEGATIVE
Staphylococcus aureus: NEGATIVE

## 2014-02-08 LAB — ABO/RH: ABO/RH(D): O POS

## 2014-02-08 LAB — TYPE AND SCREEN
ABO/RH(D): O POS
Antibody Screen: NEGATIVE

## 2014-02-08 NOTE — Progress Notes (Addendum)
Orders not signed @  PAT appt.  Permit to be signed DOS.. DA Years ago, more than 10, patient said he had reaction to Ceftin..they did some cardiac w/u to check it out, tests were neg, so antibiotic was to blame.   Denies any cardiac issues now.  DA

## 2014-02-08 NOTE — Pre-Procedure Instructions (Signed)
[name]  02/08/2014   Your procedure is scheduled on:  Thursday, Oct. 15th   Report to [facility] Admitting at 10:30 AM.   Call this number if you have problems the morning of surgery: [phone]   Remember:   Do not eat food or drink liquids after midnight Wednesday.   Take these medicines the morning of surgery with A SIP OF WATER: Hydrocodone   Do not wear jewelry - no rings or watches.  Do not wear lotions or colognes.  You may NOT wear deodorant on the day of surgery.   Men may shave face and neck.  Do not bring valuables to the hospital.  [facility] is not responsible for any belongings or valuables.               Contacts, dentures or bridgework may not be worn into surgery.  Leave suitcase in the car. After surgery it may be brought to your room.  For patients admitted to the hospital, discharge time is determined by your treatment team.                Name and phone number of your driver:    Special Instructions: "Preparing for Surgery" instruction sheet.   Please read over the following fact sheets that you were given: Pain Booklet, Blood Transfusion Information, MRSA Information and Surgical Site Infection Prevention

## 2014-02-08 NOTE — Progress Notes (Signed)
02/08/14 1019  OBSTRUCTIVE SLEEP APNEA  Have you ever been diagnosed with sleep apnea through a sleep study? No  Do you snore loudly (loud enough to be heard through closed doors)?  1 ('sometimes, not all the times")  Do you often feel tired, fatigued, or sleepy during the daytime? 0  Has anyone observed you stop breathing during your sleep? 0  Do you have, or are you being treated for high blood pressure? 0  BMI more than 35 kg/m2? 0  Age over 52 years old? 1  Neck circumference greater than 40 cm/16 inches? 1  Gender: 1  Obstructive Sleep Apnea Score 4  Score 4 or greater  Results sent to PCP

## 2014-02-11 DIAGNOSIS — M545 Low back pain, unspecified: Secondary | ICD-10-CM | POA: Insufficient documentation

## 2014-02-13 MED ORDER — VANCOMYCIN HCL 10 G IV SOLR
1500.0000 mg | INTRAVENOUS | Status: AC
  Administered 2014-02-14: 1500 mg via INTRAVENOUS
  Filled 2014-02-13 (×2): qty 1500

## 2014-02-13 MED ORDER — DEXAMETHASONE SODIUM PHOSPHATE 10 MG/ML IJ SOLN: 10.0000 mg | INTRAMUSCULAR | Status: DC

## 2014-02-14 ENCOUNTER — Encounter (HOSPITAL_COMMUNITY)
Admission: RE | Disposition: A | Discharge status: Home Health | Payer: Self-pay | Source: Ambulatory Visit | Attending: Neurological Surgery

## 2014-02-14 ENCOUNTER — Inpatient Hospital Stay (HOSPITAL_COMMUNITY)
Admission: RE | Admit: 2014-02-14 | Discharge: 2014-02-15 | DRG: 460 | Disposition: A | Discharge status: Home Health | Payer: BC Managed Care – PPO | Source: Ambulatory Visit | Attending: Neurological Surgery | Admitting: Neurological Surgery

## 2014-02-14 ENCOUNTER — Encounter (HOSPITAL_COMMUNITY): Payer: BC Managed Care – PPO | Admitting: Anesthesiology

## 2014-02-14 ENCOUNTER — Encounter (HOSPITAL_COMMUNITY): Payer: Self-pay | Admitting: Anesthesiology

## 2014-02-14 ENCOUNTER — Inpatient Hospital Stay (HOSPITAL_COMMUNITY): Payer: BC Managed Care – PPO

## 2014-02-14 ENCOUNTER — Inpatient Hospital Stay (HOSPITAL_COMMUNITY): Payer: BC Managed Care – PPO | Admitting: Anesthesiology

## 2014-02-14 DIAGNOSIS — K219 Gastro-esophageal reflux disease without esophagitis: Secondary | ICD-10-CM | POA: Diagnosis present

## 2014-02-14 DIAGNOSIS — M419 Scoliosis, unspecified: Secondary | ICD-10-CM | POA: Diagnosis present

## 2014-02-14 DIAGNOSIS — L723 Sebaceous cyst: Secondary | ICD-10-CM | POA: Diagnosis present

## 2014-02-14 DIAGNOSIS — M5136 Other intervertebral disc degeneration, lumbar region: Principal | ICD-10-CM | POA: Diagnosis present

## 2014-02-14 DIAGNOSIS — Z981 Arthrodesis status: Secondary | ICD-10-CM

## 2014-02-14 DIAGNOSIS — M4806 Spinal stenosis, lumbar region: Secondary | ICD-10-CM | POA: Diagnosis present

## 2014-02-14 DIAGNOSIS — M479 Spondylosis, unspecified: Secondary | ICD-10-CM

## 2014-02-14 DIAGNOSIS — M47896 Other spondylosis, lumbar region: Secondary | ICD-10-CM

## 2014-02-14 DIAGNOSIS — Z889 Allergy status to unspecified drugs, medicaments and biological substances status: Secondary | ICD-10-CM | POA: Diagnosis not present

## 2014-02-14 DIAGNOSIS — M545 Low back pain: Secondary | ICD-10-CM | POA: Diagnosis present

## 2014-02-14 HISTORY — PX: MAXIMUM ACCESS (MAS)POSTERIOR LUMBAR INTERBODY FUSION (PLIF) 1 LEVEL: SHX6368

## 2014-02-14 LAB — CBC WITH DIFFERENTIAL/PLATELET
Basophils Absolute: 0.1 10*3/uL (ref 0.0–0.1)
Basophils Relative: 1 % (ref 0–1)
Eosinophils Absolute: 0.2 10*3/uL (ref 0.0–0.7)
Eosinophils Relative: 2 % (ref 0–5)
HCT: 44.3 % (ref 39.0–52.0)
Hemoglobin: 15.5 g/dL (ref 13.0–17.0)
Lymphocytes Relative: 25 % (ref 12–46)
Lymphs Abs: 2.2 10*3/uL (ref 0.7–4.0)
MCH: 32.1 pg (ref 26.0–34.0)
MCHC: 35 g/dL (ref 30.0–36.0)
MCV: 91.7 fL (ref 78.0–100.0)
Monocytes Absolute: 0.7 10*3/uL (ref 0.1–1.0)
Monocytes Relative: 7 % (ref 3–12)
Neutro Abs: 5.7 10*3/uL (ref 1.7–7.7)
Neutrophils Relative %: 65 % (ref 43–77)
Platelets: 252 10*3/uL (ref 150–400)
RBC: 4.83 MIL/uL (ref 4.22–5.81)
RDW: 13.3 % (ref 11.5–15.5)
WBC: 8.7 10*3/uL (ref 4.0–10.5)

## 2014-02-14 LAB — PROTIME-INR
INR: 0.95 (ref 0.00–1.49)
Prothrombin Time: 12.8 seconds (ref 11.6–15.2)

## 2014-02-14 SURGERY — FOR MAXIMUM ACCESS (MAS) POSTERIOR LUMBAR INTERBODY FUSION (PLIF) 1 LEVEL
Anesthesia: General | Site: Back

## 2014-02-14 MED ORDER — PHENOL 1.4 % MT LIQD
1.0000 | OROMUCOSAL | Status: DC | PRN
Start: 2014-02-14 — End: 2014-02-15

## 2014-02-14 MED ORDER — METHOCARBAMOL 1000 MG/10ML IJ SOLN
500.0000 mg | Freq: Four times a day (QID) | INTRAMUSCULAR | Status: DC | PRN
  Filled 2014-02-14: qty 5

## 2014-02-14 MED ORDER — HYDROMORPHONE HCL 1 MG/ML IJ SOLN
INTRAMUSCULAR | Status: AC
  Filled 2014-02-14: qty 1

## 2014-02-14 MED ORDER — SUCCINYLCHOLINE CHLORIDE 20 MG/ML IJ SOLN
INTRAMUSCULAR | Status: DC | PRN
  Administered 2014-02-14: 120 mg via INTRAVENOUS
  Administered 2014-02-14: 80 mg via INTRAVENOUS

## 2014-02-14 MED ORDER — ONDANSETRON HCL 4 MG/2ML IJ SOLN: 4.0000 mg | INTRAMUSCULAR | Status: DC | PRN

## 2014-02-14 MED ORDER — FENTANYL CITRATE 0.05 MG/ML IJ SOLN
INTRAMUSCULAR | Status: AC
  Filled 2014-02-14: qty 5

## 2014-02-14 MED ORDER — ONDANSETRON HCL 4 MG/2ML IJ SOLN
INTRAMUSCULAR | Status: AC
  Filled 2014-02-14: qty 2

## 2014-02-14 MED ORDER — THROMBIN 5000 UNITS EX SOLR
CUTANEOUS | Status: DC | PRN
  Administered 2014-02-14: 13:00:00 via TOPICAL

## 2014-02-14 MED ORDER — LIDOCAINE HCL (CARDIAC) 20 MG/ML IV SOLN
INTRAVENOUS | Status: DC | PRN
  Administered 2014-02-14: 50 mg via INTRAVENOUS

## 2014-02-14 MED ORDER — CELECOXIB 200 MG PO CAPS
200.0000 mg | ORAL_CAPSULE | Freq: Two times a day (BID) | ORAL | Status: DC
  Administered 2014-02-14 – 2014-02-15 (×2): 200 mg via ORAL
  Filled 2014-02-14 (×2): qty 1

## 2014-02-14 MED ORDER — SODIUM CHLORIDE 0.9 % IJ SOLN: 3.0000 mL | INTRAMUSCULAR | Status: DC | PRN

## 2014-02-14 MED ORDER — SODIUM CHLORIDE 0.9 % IV SOLN: 250.0000 mL | INTRAVENOUS | Status: DC

## 2014-02-14 MED ORDER — HYDROMORPHONE HCL 1 MG/ML IJ SOLN: 0.2500 mg | INTRAMUSCULAR | Status: DC | PRN

## 2014-02-14 MED ORDER — PROPOFOL 10 MG/ML IV BOLUS
INTRAVENOUS | Status: AC
  Filled 2014-02-14: qty 20

## 2014-02-14 MED ORDER — ARTIFICIAL TEARS OP OINT
TOPICAL_OINTMENT | OPHTHALMIC | Status: DC | PRN
  Administered 2014-02-14: 1 via OPHTHALMIC

## 2014-02-14 MED ORDER — FENTANYL CITRATE 0.05 MG/ML IJ SOLN
INTRAMUSCULAR | Status: DC | PRN
  Administered 2014-02-14: 50 ug via INTRAVENOUS
  Administered 2014-02-14: 250 ug via INTRAVENOUS
  Administered 2014-02-14 (×4): 50 ug via INTRAVENOUS
  Administered 2014-02-14: 100 ug via INTRAVENOUS
  Administered 2014-02-14 (×3): 50 ug via INTRAVENOUS

## 2014-02-14 MED ORDER — BUPIVACAINE HCL (PF) 0.25 % IJ SOLN
INTRAMUSCULAR | Status: DC | PRN
  Administered 2014-02-14: 3 mL

## 2014-02-14 MED ORDER — THROMBIN 20000 UNITS EX SOLR
CUTANEOUS | Status: DC | PRN
  Administered 2014-02-14: 13:00:00 via TOPICAL

## 2014-02-14 MED ORDER — LACTATED RINGERS IV SOLN
INTRAVENOUS | Status: DC | PRN
  Administered 2014-02-14 (×3): via INTRAVENOUS

## 2014-02-14 MED ORDER — ONDANSETRON HCL 4 MG/2ML IJ SOLN
INTRAMUSCULAR | Status: DC | PRN
  Administered 2014-02-14: 4 mg via INTRAVENOUS

## 2014-02-14 MED ORDER — ALBUTEROL SULFATE HFA 108 (90 BASE) MCG/ACT IN AERS
INHALATION_SPRAY | RESPIRATORY_TRACT | Status: AC
  Filled 2014-02-14: qty 6.7

## 2014-02-14 MED ORDER — MENTHOL 3 MG MT LOZG: 1.0000 | LOZENGE | OROMUCOSAL | Status: DC | PRN

## 2014-02-14 MED ORDER — POTASSIUM CHLORIDE IN NACL 20-0.9 MEQ/L-% IV SOLN
INTRAVENOUS | Status: DC
  Administered 2014-02-14 – 2014-02-15 (×2): via INTRAVENOUS
  Filled 2014-02-14: qty 1000

## 2014-02-14 MED ORDER — SODIUM CHLORIDE 0.9 % IJ SOLN: 3.0000 mL | Freq: Two times a day (BID) | INTRAMUSCULAR | Status: DC

## 2014-02-14 MED ORDER — MIDAZOLAM HCL 2 MG/2ML IJ SOLN
INTRAMUSCULAR | Status: AC
  Filled 2014-02-14: qty 2

## 2014-02-14 MED ORDER — PROPOFOL 10 MG/ML IV BOLUS
INTRAVENOUS | Status: DC | PRN
  Administered 2014-02-14: 200 mg via INTRAVENOUS

## 2014-02-14 MED ORDER — OXYCODONE-ACETAMINOPHEN 5-325 MG PO TABS
1.0000 | ORAL_TABLET | ORAL | Status: DC | PRN
  Administered 2014-02-14 – 2014-02-15 (×4): 2 via ORAL
  Filled 2014-02-14 (×4): qty 2

## 2014-02-14 MED ORDER — METHOCARBAMOL 500 MG PO TABS
500.0000 mg | ORAL_TABLET | Freq: Four times a day (QID) | ORAL | Status: DC | PRN
  Administered 2014-02-14 – 2014-02-15 (×2): 500 mg via ORAL
  Filled 2014-02-14 (×2): qty 1

## 2014-02-14 MED ORDER — LIDOCAINE HCL (CARDIAC) 20 MG/ML IV SOLN
INTRAVENOUS | Status: AC
  Filled 2014-02-14: qty 5

## 2014-02-14 MED ORDER — 0.9 % SODIUM CHLORIDE (POUR BTL) OPTIME
TOPICAL | Status: DC | PRN
  Administered 2014-02-14: 1000 mL

## 2014-02-14 MED ORDER — ALBUTEROL SULFATE HFA 108 (90 BASE) MCG/ACT IN AERS
INHALATION_SPRAY | RESPIRATORY_TRACT | Status: DC | PRN
  Administered 2014-02-14: 4 via RESPIRATORY_TRACT

## 2014-02-14 MED ORDER — VANCOMYCIN HCL IN DEXTROSE 1-5 GM/200ML-% IV SOLN
1000.0000 mg | Freq: Three times a day (TID) | INTRAVENOUS | Status: DC
  Administered 2014-02-14 – 2014-02-15 (×2): 1000 mg via INTRAVENOUS
  Filled 2014-02-14 (×4): qty 200

## 2014-02-14 MED ORDER — SODIUM CHLORIDE 0.9 % IV SOLN
INTRAVENOUS | Status: DC | PRN
  Administered 2014-02-14: 13:00:00 via INTRAVENOUS

## 2014-02-14 MED ORDER — MORPHINE SULFATE 2 MG/ML IJ SOLN
1.0000 mg | INTRAMUSCULAR | Status: DC | PRN
  Administered 2014-02-14 – 2014-02-15 (×3): 4 mg via INTRAVENOUS
  Filled 2014-02-14 (×3): qty 2

## 2014-02-14 MED ORDER — ACETAMINOPHEN 325 MG PO TABS: 650.0000 mg | ORAL_TABLET | ORAL | Status: DC | PRN

## 2014-02-14 MED ORDER — ACETAMINOPHEN 650 MG RE SUPP: 650.0000 mg | RECTAL | Status: DC | PRN

## 2014-02-14 MED ORDER — MIDAZOLAM HCL 5 MG/5ML IJ SOLN
INTRAMUSCULAR | Status: DC | PRN
  Administered 2014-02-14 (×2): 2 mg via INTRAVENOUS

## 2014-02-14 MED ORDER — ARTIFICIAL TEARS OP OINT
TOPICAL_OINTMENT | OPHTHALMIC | Status: AC
  Filled 2014-02-14: qty 3.5

## 2014-02-14 MED ORDER — ROCURONIUM BROMIDE 50 MG/5ML IV SOLN
INTRAVENOUS | Status: AC
  Filled 2014-02-14: qty 1

## 2014-02-14 MED ORDER — SODIUM CHLORIDE 0.9 % IR SOLN
Status: DC | PRN
  Administered 2014-02-14: 13:00:00

## 2014-02-14 MED ORDER — HYDROMORPHONE HCL 1 MG/ML IJ SOLN
0.2500 mg | INTRAMUSCULAR | Status: AC | PRN
  Administered 2014-02-14 (×8): 0.5 mg via INTRAVENOUS

## 2014-02-14 SURGICAL SUPPLY — 74 items
ADH SKN CLS APL DERMABOND .7 (GAUZE/BANDAGES/DRESSINGS) ×1
APL SKNCLS STERI-STRIP NONHPOA (GAUZE/BANDAGES/DRESSINGS) ×2
BAG DECANTER FOR FLEXI CONT (MISCELLANEOUS) ×2 IMPLANT
BENZOIN TINCTURE PRP APPL 2/3 (GAUZE/BANDAGES/DRESSINGS) ×3 IMPLANT
BLADE CLIPPER SURG (BLADE) IMPLANT
BONE MATRIX OSTEOCEL PRO SM (Bone Implant) ×1 IMPLANT
BUR MATCHSTICK NEURO 3.0 LAGG (BURR) ×2 IMPLANT
CAGE COROENT MP 8X23 (Cage) ×1 IMPLANT
CAGE PLIF MAS 9X8X28-4 LUMBAR (Cage) ×1 IMPLANT
CANISTER SUCT 3000ML (MISCELLANEOUS) ×2 IMPLANT
CLIP NEUROVISION LG (CLIP) ×1 IMPLANT
CONT SPEC 4OZ CLIKSEAL STRL BL (MISCELLANEOUS) ×6 IMPLANT
COVER BACK TABLE 24X17X13 BIG (DRAPES) IMPLANT
COVER TABLE BACK 60X90 (DRAPES) ×2 IMPLANT
DERMABOND ADVANCED (GAUZE/BANDAGES/DRESSINGS) ×1
DERMABOND ADVANCED .7 DNX12 (GAUZE/BANDAGES/DRESSINGS) ×1 IMPLANT
DRAPE C-ARM 42X72 X-RAY (DRAPES) ×2 IMPLANT
DRAPE C-ARMOR (DRAPES) ×2 IMPLANT
DRAPE LAPAROTOMY 100X72X124 (DRAPES) ×2 IMPLANT
DRAPE POUCH INSTRU U-SHP 10X18 (DRAPES) ×2 IMPLANT
DRAPE SURG 17X23 STRL (DRAPES) ×2 IMPLANT
DRSG OPSITE 4X5.5 SM (GAUZE/BANDAGES/DRESSINGS) ×2 IMPLANT
DRSG OPSITE POSTOP 4X6 (GAUZE/BANDAGES/DRESSINGS) ×4 IMPLANT
DRSG TELFA 3X8 NADH (GAUZE/BANDAGES/DRESSINGS) IMPLANT
DURAPREP 26ML APPLICATOR (WOUND CARE) ×2 IMPLANT
ELECT REM PT RETURN 9FT ADLT (ELECTROSURGICAL) ×2
ELECTRODE REM PT RTRN 9FT ADLT (ELECTROSURGICAL) ×1 IMPLANT
EVACUATOR 1/8 PVC DRAIN (DRAIN) ×1 IMPLANT
GAUZE SPONGE 4X4 16PLY XRAY LF (GAUZE/BANDAGES/DRESSINGS) IMPLANT
GLOVE BIO SURGEON STRL SZ8 (GLOVE) ×4 IMPLANT
GLOVE BIOGEL PI IND STRL 7.5 (GLOVE) IMPLANT
GLOVE BIOGEL PI IND STRL 8 (GLOVE) IMPLANT
GLOVE BIOGEL PI INDICATOR 7.5 (GLOVE) ×1
GLOVE BIOGEL PI INDICATOR 8 (GLOVE) ×1
GLOVE ECLIPSE 7.5 STRL STRAW (GLOVE) ×5 IMPLANT
GLOVE INDICATOR 7.5 STRL GRN (GLOVE) ×1 IMPLANT
GLOVE INDICATOR 8.0 STRL GRN (GLOVE) ×2 IMPLANT
GOWN STRL REUS W/ TWL LRG LVL3 (GOWN DISPOSABLE) IMPLANT
GOWN STRL REUS W/ TWL XL LVL3 (GOWN DISPOSABLE) ×2 IMPLANT
GOWN STRL REUS W/TWL 2XL LVL3 (GOWN DISPOSABLE) ×1 IMPLANT
GOWN STRL REUS W/TWL LRG LVL3 (GOWN DISPOSABLE)
GOWN STRL REUS W/TWL XL LVL3 (GOWN DISPOSABLE) ×6
HEMOSTAT POWDER KIT SURGIFOAM (HEMOSTASIS) IMPLANT
KIT BASIN OR (CUSTOM PROCEDURE TRAY) ×2 IMPLANT
KIT NDL NVM5 EMG ELECT (KITS) IMPLANT
KIT NEEDLE NVM5 EMG ELECT (KITS) ×1 IMPLANT
KIT NEEDLE NVM5 EMG ELECTRODE (KITS) ×1
KIT ROOM TURNOVER OR (KITS) ×2 IMPLANT
MILL MEDIUM DISP (BLADE) ×1 IMPLANT
NEEDLE HYPO 25X1 1.5 SAFETY (NEEDLE) ×2 IMPLANT
NS IRRIG 1000ML POUR BTL (IV SOLUTION) ×2 IMPLANT
PACK LAMINECTOMY NEURO (CUSTOM PROCEDURE TRAY) ×2 IMPLANT
PAD ARMBOARD 7.5X6 YLW CONV (MISCELLANEOUS) ×6 IMPLANT
PAD DRESSING TELFA 3X8 NADH (GAUZE/BANDAGES/DRESSINGS) ×1 IMPLANT
ROD 35MM (Rod) ×2 IMPLANT
SCREW LOCK (Screw) ×8 IMPLANT
SCREW LOCK FXNS SPNE MAS PL (Screw) IMPLANT
SCREW SHANK 5.0X30MM (Screw) ×2 IMPLANT
SCREW SHANK 5.0X35 (Screw) ×2 IMPLANT
SCREW TULIP 5.5 (Screw) ×4 IMPLANT
SPONGE LAP 4X18 X RAY DECT (DISPOSABLE) IMPLANT
SPONGE SURGIFOAM ABS GEL 100 (HEMOSTASIS) ×2 IMPLANT
STRIP CLOSURE SKIN 1/2X4 (GAUZE/BANDAGES/DRESSINGS) ×4 IMPLANT
SUT VIC AB 0 CT1 18XCR BRD8 (SUTURE) ×2 IMPLANT
SUT VIC AB 0 CT1 8-18 (SUTURE) ×4
SUT VIC AB 2-0 CP2 18 (SUTURE) ×2 IMPLANT
SUT VIC AB 3-0 SH 8-18 (SUTURE) ×4 IMPLANT
SYR 20ML ECCENTRIC (SYRINGE) ×2 IMPLANT
SYR 3ML LL SCALE MARK (SYRINGE) IMPLANT
TOWEL OR 17X24 6PK STRL BLUE (TOWEL DISPOSABLE) ×2 IMPLANT
TOWEL OR 17X26 10 PK STRL BLUE (TOWEL DISPOSABLE) ×2 IMPLANT
TRAY FOLEY CATH 14FRSI W/METER (CATHETERS) IMPLANT
TRAY FOLEY CATH 16FRSI W/METER (SET/KITS/TRAYS/PACK) ×1 IMPLANT
WATER STERILE IRR 1000ML POUR (IV SOLUTION) ×2 IMPLANT

## 2014-02-14 NOTE — Transfer of Care (Signed)
Immediate Anesthesia Transfer of Care Note  Patient: [name]  Procedure(s) Performed: Procedure(s): FOR MAXIMUM ACCESS SURGERY POSTERIOR LUMBAR INTERBODY FUSION LUMBAR TWO-THREE (N/A)  Patient Location: PACU  Anesthesia Type:General  Level of Consciousness: awake, alert , oriented and patient cooperative  Airway & Oxygen Therapy: Patient Spontanous Breathing and Patient connected to nasal cannula oxygen  Post-op Assessment: Report given to PACU RN, Post -op Vital signs reviewed and stable and Patient moving all extremities X 4  Post vital signs: Reviewed and stable  Complications: No apparent anesthesia complications

## 2014-02-14 NOTE — Op Note (Signed)
02/14/2014  4:27 PM  PATIENT:  [name]  53 y.o. male  PRE-OPERATIVE DIAGNOSIS:  1. Severe degenerative disc disease with scoliosis and stenosis centered at L2 -3, back and leg pain, 2. Posterior thoracic sebaceous cyst measuring 2 cm  POST-OPERATIVE DIAGNOSIS:  Same  PROCEDURE:   1. Decompressive lumbar laminectomy L2-3 requiring more work than would be required for a simple exposure of the disk for PLIF in order to adequately decompress the neural elements and address the spinal stenosis 2. Posterior lumbar interbody fusion L2-3 using PEEK interbody cages packed with morcellized allograft and autograft 3. Posterior fixation L2-3 using cortical pedicle screws.  4. through a separate incision as it a completely separate procedure, removal of a dorsal thoracic sebaceous cyst  SURGEON:  Sherley Bounds, MD  ASSISTANTS: Dr. Vertell Limber  ANESTHESIA:  General  EBL: 300 ml  Total I/O In: 2000 [I.V.:2000] Out: 450 [Urine:150; Blood:300]  BLOOD ADMINISTERED:none  DRAINS: Hemovac   INDICATION FOR PROCEDURE: This patient presented with a long history of severe back pain radiation into the anterior thighs. He had an MRI and a CT scan which showed severe degenerative disease with a large right-sided osteophyte at L2-3 with lateral listhesis and scoliosis centered at this level. Kyphosis on flexion-extension films. He tried medical management without relief. Recommended decompression and instrument infusion. He also had a posterior thoracic sebaceous cyst that had attempted resection in the past. He wanted this also removed. Patient understood the risks, benefits, and alternatives and potential outcomes and wished to proceed.  PROCEDURE DETAILS:  The patient was brought to the operating room. After induction of generalized endotracheal anesthesia the patient was rolled into the prone position on chest rolls and all pressure points were padded. The patient's lumbar region was cleaned and then  prepped with DuraPrep and draped in the usual sterile fashion. Anesthesia was injected and then a dorsal midline incision was made and carried down to the lumbosacral fascia. The fascia was opened and the paraspinous musculature was taken down in a subperiosteal fashion to expose L2-3. A self-retaining retractor was placed. Intraoperative fluoroscopy confirmed my level, and I started with placement of the L2 cortical pedicle screws. The pedicle screw entry zones were identified utilizing surface landmarks and  AP and lateral fluoroscopy. I scored the cortex with the high-speed drill and then used the hand drill and EMG monitoring to drill an upward and outward direction into the pedicle. I then tapped line to line, and the tap was also monitored. I then placed a 5 0 x 30 mm cortical pedicle screw into the pedicles of L2 bilaterally. I then turned my attention to the decompression and the spinous process was removed and complete lumbar laminectomies, hemi- facetectomies, and foraminotomies were performed at L2-3. The patient had significant spinal stenosis and this required more work than would be required for a simple exposure of the disc for posterior lumbar interbody fusion. Much more generous decompression was undertaken in order to adequately decompress the neural elements and address the patient's leg pain. The yellow ligament was removed to expose the underlying dura and nerve roots, and generous foraminotomies were performed to adequately decompress the neural elements. Both the exiting and traversing nerve roots were decompressed on both sides until a coronary dilator passed easily along the nerve roots. Once the decompression was complete, I turned my attention to the posterior lower lumbar interbody fusion. The epidural venous vasculature was coagulated and cut sharply. Disc space was incised and the initial discectomy was performed  with pituitary rongeurs after distraction the disc space. The disc space  was quite collapsed. The disc space was distracted with sequential distractors to a height of 8 mm. We then used a series of scrapers and shavers to prepare the endplates for fusion. The midline was prepared with Epstein curettes. Once the complete discectomy was finished, we packed an appropriate sized peek interbody cage with local autograft and morcellized allograft, gently retracted the nerve root, and tapped the cage into position at L2-3.  The midline between the cages was packed with morselized autograft and allograft. We then turned our attention to the placement of the lower pedicle screws. The pedicle screw entry zones were identified utilizing surface landmarks and fluoroscopy. I drilled into each pedicle utilizing the hand drill and EMG monitoring, and tapped each pedicle with the appropriate tap. We palpated with a ball probe to assure no break in the cortex. We then placed 5-0 x 40 mm cortical pedicle screw into the pedicles bilaterally at L3. We then placed lordotic rods into the multiaxial screw heads of the pedicle screws and locked these in position with the locking caps and anti-torque device. We then checked our construct with AP and lateral fluoroscopy. Irrigated with copious amounts of bacitracin-containing saline solution. Placed a medium Hemovac drain through separate stab incision. Inspected the nerve roots once again to assure adequate decompression, lined to the dura with Gelfoam, and closed the muscle and the fascia with 0 Vicryl. Closed the subcutaneous tissues with 2-0 Vicryl and subcuticular tissues with 3-0 Vicryl. The skin was closed with benzoin and Steri-Strips. Dressing was then applied.  We then prepped the dorsal thoracic sebaceous cyst with DuraPrep. It was draped in usual sterile fashion. An incision was made over the cyst and then careful dissection was used around the cyst and it was removed en bloc utilizing sharp dissection. It was sent for permanent pathology. The small  incision was then closed with layers of 3-0 Vicryl in subcutaneous and subcuticular tissues. The skin was closed with benzoin Steri-Strips. A sterile dressing was applied, the patient was awakened from general anesthesia and transported to the recovery room in stable condition. At the end of the procedure all sponge, needle and instrument counts were correct.   PLAN OF CARE: Admit to inpatient   PATIENT DISPOSITION:  PACU - hemodynamically stable.   Delay start of Pharmacological VTE agent (>24hrs) due to surgical blood loss or risk of bleeding:  yes

## 2014-02-14 NOTE — Anesthesia Procedure Notes (Addendum)
Procedure Name: Intubation Date/Time: 02/14/2014 12:54 PM Performed by: Neldon Newport Pre-anesthesia Checklist: Timeout performed, Patient identified, Emergency Drugs available, Suction available and Patient being monitored Patient Re-evaluated:Patient Re-evaluated prior to inductionOxygen Delivery Method: Circle system utilized Preoxygenation: Pre-oxygenation with 100% oxygen Intubation Type: IV induction and Rapid sequence Ventilation: Mask ventilation without difficulty Laryngoscope Size: Mac, 3, Miller and 2 Grade View: Grade III Tube type: Oral Number of attempts: 3 (Anterior anatomy) Airway Equipment and Method: Video-laryngoscopy Placement Confirmation: positive ETCO2,  ETT inserted through vocal cords under direct vision and breath sounds checked- equal and bilateral Secured at: 23 cm Tube secured with: Tape Dental Injury: Teeth and Oropharynx as per pre-operative assessment  Difficulty Due To: Difficult Airway- due to anterior larynx

## 2014-02-14 NOTE — H&P (Signed)
Subjective: Patient is a 53 y.o. male admitted for PLIF. Onset of symptoms was several months ago, gradually worsening since that time.  The pain is rated severe, and is located at the across the lower back and radiates to ant thigh. The pain is described as aching and occurs all day. The symptoms have been progressive. Symptoms are exacerbated by exercise and standing. MRI or CT showed DDD/ stenosis L2-3.   Past Medical History  Diagnosis Date  . Arthritis     Back  . Colon polyps   . GERD (gastroesophageal reflux disease)     every now and then    Past Surgical History  Procedure Laterality Date  . Eye surgery    . Shoulder surgery    . Neck surgery    . Spine surgery    . Tonsillectomy      Prior to Admission medications   Medication Sig Start Date End Date Taking? Authorizing Provider  HYDROcodone-acetaminophen (NORCO) 10-325 MG per tablet Take 1 tablet by mouth every 4 (four) hours as needed for moderate pain.   Yes Historical Provider, MD  celecoxib (CELEBREX) 200 MG capsule Take 1 capsule (200 mg total) by mouth 2 (two) times daily. 07/12/13   Richard Chessman, DO   Allergies  Allergen Reactions  . Cefuroxime Axetil     REACTION: hives    History  Substance Use Topics  . Smoking status: Former Smoker -- 1.00 packs/day for 30 years    Types: Cigarettes    Quit date: 01/06/2012  . Smokeless tobacco: Current User    Types: Snuff     Comment: quit with chantix 1x but only stayed on it a month  . Alcohol Use: 12.0 oz/week    24 drink(s) per week     Comment: once or twice a week    Family History  Problem Relation Age of Onset  . Cancer Father 60    Unknown  . Hypertension Mother   . Colon polyps Brother   . Aneurysm Maternal Aunt 58    brain  . Heart disease Maternal Aunt     d 72  . Cancer Paternal Uncle 90    stomach  . Aneurysm Maternal Grandfather     stomach  . Cancer Paternal Grandfather 18    stomach  . Depression Brother 20    suicide  . Cancer  Paternal Uncle 58    stomach     Review of Systems  Positive ROS: neg  All other systems have been reviewed and were otherwise negative with the exception of those mentioned in the HPI and as above.  Objective: Vital signs in last 24 hours: Temp:  [98.4 F (36.9 C)] 98.4 F (36.9 C) (10/15 1042) Pulse Rate:  [76] 76 (10/15 1042) Resp:  [20] 20 (10/15 1042) BP: (128)/(86) 128/86 mmHg (10/15 1042) SpO2:  [97 %] 97 % (10/15 1042) Weight:  [110.133 kg (242 lb 12.8 oz)] 110.133 kg (242 lb 12.8 oz) (10/15 1042)  General Appearance: Alert, cooperative, no distress, appears stated age Head: Normocephalic, without obvious abnormality, atraumatic Eyes: PERRL, conjunctiva/corneas clear, EOM's intact    Neck: Supple, symmetrical, trachea midline Back: Symmetric, no curvature, ROM normal, no CVA tenderness Lungs:  respirations unlabored Heart: Regular rate and rhythm Abdomen: Soft, non-tender Extremities: Extremities normal, atraumatic, no cyanosis or edema Pulses: 2+ and symmetric all extremities Skin: Skin color, texture, turgor normal, no rashes or lesions  NEUROLOGIC:   Mental status: Alert and oriented x4,  no aphasia,  good attention span, fund of knowledge, and memory Motor Exam - grossly normal Sensory Exam - grossly normal Reflexes: 1+ Coordination - grossly normal Gait - grossly normal Balance - grossly normal Cranial Nerves: I: smell Not tested  II: visual acuity  OS: nl    OD: nl  II: visual fields Full to confrontation  II: pupils Equal, round, reactive to light  III,VII: ptosis None  III,IV,VI: extraocular muscles  Full ROM  V: mastication Normal  V: facial light touch sensation  Normal  V,VII: corneal reflex  Present  VII: facial muscle function - upper  Normal  VII: facial muscle function - lower Normal  VIII: hearing Not tested  IX: soft palate elevation  Normal  IX,X: gag reflex Present  XI: trapezius strength  5/5  XI: sternocleidomastoid strength 5/5   XI: neck flexion strength  5/5  XII: tongue strength  Normal    Data Review Lab Results  Component Value Date   WBC 8.7 02/14/2014   HGB 15.5 02/14/2014   HCT 44.3 02/14/2014   MCV 91.7 02/14/2014   PLT 252 02/14/2014   Lab Results  Component Value Date   NA 140 02/08/2014   K 5.7* 02/08/2014   CL 103 02/08/2014   CO2 26 02/08/2014   BUN 13 02/08/2014   CREATININE 0.85 02/08/2014   GLUCOSE 97 02/08/2014   Lab Results  Component Value Date   INR 0.95 02/14/2014    Assessment/Plan: Patient admitted for PLIF. Also wants a sebaceous cyst removed from upper back. Patient has failed a reasonable attempt at conservative therapy.  I explained the condition and procedure to the patient and answered any questions.  Patient wishes to proceed with procedure as planned. Understands risks/ benefits and typical outcomes of procedure.   [name] S 02/14/2014 11:59 AM

## 2014-02-14 NOTE — Progress Notes (Signed)
May give additional dilaudid - per Dr Ola Spurr

## 2014-02-14 NOTE — Anesthesia Preprocedure Evaluation (Addendum)
Anesthesia Evaluation  Patient identified by MRN, date of birth, ID band Patient awake    Reviewed: Allergy & Precautions, H&P , NPO status , Patient's Chart, lab work & pertinent test results  Airway Mallampati: II TM Distance: >3 FB Neck ROM: full    Dental  (+) Teeth Intact, Dental Advidsory Given   Pulmonary former smoker,  breath sounds clear to auscultation        Cardiovascular negative cardio ROS  Rhythm:Regular Rate:Normal     Neuro/Psych    GI/Hepatic Neg liver ROS, GERD-  ,  Endo/Other  negative endocrine ROS  Renal/GU negative Renal ROS     Musculoskeletal   Abdominal   Peds  Hematology   Anesthesia Other Findings   Reproductive/Obstetrics                         Anesthesia Physical Anesthesia Plan  ASA: II  Anesthesia Plan: General   Post-op Pain Management:    Induction: Intravenous  Airway Management Planned: Oral ETT  Additional Equipment:   Intra-op Plan:   Post-operative Plan: Possible Post-op intubation/ventilation  Informed Consent: I have reviewed the patients History and Physical, chart, labs and discussed the procedure including the risks, benefits and alternatives for the proposed anesthesia with the patient or authorized representative who has indicated his/her understanding and acceptance.   Dental Advisory Given  Plan Discussed with: CRNA and Anesthesiologist  Anesthesia Plan Comments:       Anesthesia Quick Evaluation

## 2014-02-14 NOTE — Progress Notes (Signed)
ANTIBIOTIC CONSULT NOTE - INITIAL  Pharmacy Consult for vancomycin Indication: surgical prophylaxis  Allergies  Allergen Reactions  . Cefuroxime Axetil     REACTION: hives    Patient Measurements: Height: 5\' 11"  (180.3 cm) Weight: 242 lb 12.8 oz (110.133 kg) IBW/kg (Calculated) : 75.3  Vital Signs: Temp: 98.6 F (37 C) (10/15 1854) Temp Source: Oral (10/15 1854) BP: 131/77 mmHg (10/15 1854) Pulse Rate: 81 (10/15 1854) Intake/Output from previous day:   Intake/Output from this shift: Total I/O In: 480 [P.O.:480] Out: -   Labs:  Recent Labs  02/14/14 1049  WBC 8.7  HGB 15.5  PLT 252   Estimated Creatinine Clearance: 128.3 ml/min (by C-G formula based on Cr of 0.85). No results found for this basename: VANCOTROUGH, Corlis Leak, VANCORANDOM, GENTTROUGH, GENTPEAK, GENTRANDOM, TOBRATROUGH, TOBRAPEAK, TOBRARND, AMIKACINPEAK, AMIKACINTROU, AMIKACIN,  in the last 72 hours   Microbiology: Recent Results (from the past 720 hour(s))  SURGICAL PCR SCREEN     Status: None   Collection Time    02/08/14 10:38 AM      Result Value Ref Range Status   MRSA, PCR NEGATIVE  NEGATIVE Final   Staphylococcus aureus NEGATIVE  NEGATIVE Final   Comment:            The Xpert SA Assay (FDA     approved for NASAL specimens     in patients over 52 years of age),     is one component of     a comprehensive surveillance     program.  Test performance has     been validated by [employer] for patients greater     than or equal to 52 year old.     It is not intended     to diagnose infection nor to     guide or monitor treatment.    Medical History: Past Medical History  Diagnosis Date  . Arthritis     Back  . Colon polyps   . GERD (gastroesophageal reflux disease)     every now and then    Assessment: 53 YOM s/p decompressive lumbar laminectomy with placement of a closed system drain.  Received a pre-op dose of vancomycin 1500mg  IV at 1232 this afternoon. SCr 0.85 with est  CrCl >122mL/min  Goal of Therapy:  Vancomycin trough level 15-20 mcg/ml  Plan:  1. Vancomycin 1000mg  IV q8h 2. Follow renal function, length of time drain is in place, trough at [place] D. Eboney Claybrook, PharmD, BCPS Clinical Pharmacist Pager: [phone] 02/14/2014 7:47 PM

## 2014-02-14 NOTE — Anesthesia Postprocedure Evaluation (Signed)
  Anesthesia Post-op Note  Patient: [name]  Procedure(s) Performed: Procedure(s): FOR MAXIMUM ACCESS SURGERY POSTERIOR LUMBAR INTERBODY FUSION LUMBAR TWO-THREE (N/A)  Patient Location: PACU  Anesthesia Type:General  Level of Consciousness: awake  Airway and Oxygen Therapy: Patient Spontanous Breathing  Post-op Pain: mild  Post-op Assessment: Post-op Vital signs reviewed  Post-op Vital Signs: Reviewed  Last Vitals:  Filed Vitals:   02/14/14 1042  BP: 128/86  Pulse: 76  Temp: 36.9 C  Resp: 20    Complications: No apparent anesthesia complications

## 2014-02-15 MED ORDER — METHOCARBAMOL 500 MG PO TABS: 500.0000 mg | ORAL_TABLET | Freq: Four times a day (QID) | ORAL | Status: DC | PRN

## 2014-02-15 MED ORDER — OXYCODONE-ACETAMINOPHEN 5-325 MG PO TABS: 1.0000 | ORAL_TABLET | ORAL | Status: DC | PRN

## 2014-02-15 NOTE — Progress Notes (Signed)
1300 - discharge instructions and prescriptions given to pt and pt's wife. Pt verbally acknowledged understanding. No questions asked when prompted. Pt to be transported to home by private car by wife. Tech to transport pt per wheelchair to lobby. Will monitor   [name] I 02/15/2014 1:14 PM

## 2014-02-15 NOTE — Progress Notes (Signed)
[facility] called for DME RW/ 31 for discharge home today; Aneta Mins [phone]

## 2014-02-15 NOTE — Discharge Summary (Signed)
Physician Discharge Summary  Patient ID: [name] MRN: [id number] DOB/AGE: 52-May-1962 52 y.o.  Admit date: 02/14/2014 Discharge date: 02/15/2014  Admission Diagnoses: Spondylosis with stenosis, degenerative disc disease, scoliosis L2-3   Discharge Diagnoses: Same   Discharged Condition: good  Hospital Course: The patient was admitted on 02/14/2014 and taken to the operating room where the patient underwent PLIF L2-3. The patient tolerated the procedure well and was taken to the recovery room and then to the floor in stable condition. The hospital course was routine. There were no complications. The wound remained clean dry and intact. Pt had appropriate back soreness. No complaints of leg pain or new N/T/W. The patient remained afebrile with stable vital signs, and tolerated a regular diet. The patient continued to increase activities, and pain was well controlled with oral pain medications.   Consults: None  Significant Diagnostic Studies:  Results for orders placed during the hospital encounter of 02/14/14  PROTIME-INR      Result Value Ref Range   Prothrombin Time 12.8  11.6 - 15.2 seconds   INR 0.95  0.00 - 1.49  CBC WITH DIFFERENTIAL      Result Value Ref Range   WBC 8.7  4.0 - 10.5 K/uL   RBC 4.83  4.22 - 5.81 MIL/uL   Hemoglobin 15.5  13.0 - 17.0 g/dL   HCT 44.3  39.0 - 52.0 %   MCV 91.7  78.0 - 100.0 fL   MCH 32.1  26.0 - 34.0 pg   MCHC 35.0  30.0 - 36.0 g/dL   RDW 13.3  11.5 - 15.5 %   Platelets 252  150 - 400 K/uL   Neutrophils Relative % 65  43 - 77 %   Neutro Abs 5.7  1.7 - 7.7 K/uL   Lymphocytes Relative 25  12 - 46 %   Lymphs Abs 2.2  0.7 - 4.0 K/uL   Monocytes Relative 7  3 - 12 %   Monocytes Absolute 0.7  0.1 - 1.0 K/uL   Eosinophils Relative 2  0 - 5 %   Eosinophils Absolute 0.2  0.0 - 0.7 K/uL   Basophils Relative 1  0 - 1 %   Basophils Absolute 0.1  0.0 - 0.1 K/uL    Chest 2 View  02/14/2014   CLINICAL DATA:  Preop, spondylosis, lumbar  interbody fusion  EXAM: CHEST  2 VIEW  COMPARISON:  03/09/2012  FINDINGS: Cardiomediastinal silhouette is stable. No acute infiltrate or pleural effusion. No pulmonary edema. Metallic fixation plate noted cervical spine. Bony thorax is unremarkable.  IMPRESSION: No active cardiopulmonary disease.   Electronically Signed   By: Lahoma Crocker M.D.   On: 02/14/2014 11:16   Dg Lumbar Spine 2-3 Views  02/14/2014   CLINICAL DATA:  L2-3 fusion.  EXAM: LUMBAR SPINE - 2-3 VIEW; DG C-ARM 61-120 MIN  COMPARISON:  02/14/2014.  FINDINGS: Patient appears to have undergone prior L2-L3 posterior and interbody fusion with good anatomic alignment. Hardware appears to be intact.  IMPRESSION: Patient appears to have undergone prior L2-3 posterior and interbody fusion with good anatomic alignment.   Electronically Signed   By: Marcello Moores  Register   On: 02/14/2014 16:19   Dg C-arm 1-60 Min  02/14/2014   CLINICAL DATA:  L2-3 fusion.  EXAM: LUMBAR SPINE - 2-3 VIEW; DG C-ARM 61-120 MIN  COMPARISON:  02/14/2014.  FINDINGS: Patient appears to have undergone prior L2-L3 posterior and interbody fusion with good anatomic alignment. Hardware appears to be intact.  IMPRESSION:  Patient appears to have undergone prior L2-3 posterior and interbody fusion with good anatomic alignment.   Electronically Signed   By: Marcello Moores  Register   On: 02/14/2014 16:19    Antibiotics:  Anti-infectives   Start     Dose/Rate Route Frequency Ordered Stop   02/14/14 2000  vancomycin (VANCOCIN) IVPB 1000 mg/200 mL premix     1,000 mg 200 mL/hr over 60 Minutes Intravenous Every 8 hours 02/14/14 1947     02/14/14 1250  bacitracin 50,000 Units in sodium chloride irrigation 0.9 % 500 mL irrigation  Status:  Discontinued       As needed 02/14/14 1401 02/14/14 1636   02/14/14 0600  vancomycin (VANCOCIN) 1,500 mg in sodium chloride 0.9 % 500 mL IVPB     1,500 mg 250 mL/hr over 120 Minutes Intravenous On call to O.R. 02/13/14 1420 02/14/14 1332      Discharge  Exam: Blood pressure 106/71, pulse 81, temperature 98.2 F (36.8 C), temperature source Oral, resp. rate 18, height 5\' 11"  (1.803 m), weight 110.133 kg (242 lb 12.8 oz), SpO2 99.00%. Neurologic: Grossly normal Incision clean dry and intact  Discharge Medications:     Medication List    STOP taking these medications       celecoxib 200 MG capsule  Commonly known as:  CELEBREX     HYDROcodone-acetaminophen 10-325 MG per tablet  Commonly known as:  NORCO      TAKE these medications       methocarbamol 500 MG tablet  Commonly known as:  ROBAXIN  Take 1 tablet (500 mg total) by mouth every 6 (six) hours as needed for muscle spasms.     oxyCODONE-acetaminophen 5-325 MG per tablet  Commonly known as:  PERCOCET/ROXICET  Take 1-2 tablets by mouth every 4 (four) hours as needed for moderate pain.        Disposition: Home   Final Dx: PLIF L2-3      Discharge Instructions    Remove dressing in 72 hours    Complete by:  As directed      Call MD for:  difficulty breathing, headache or visual disturbances    Complete by:  As directed      Call MD for:  persistant nausea and vomiting    Complete by:  As directed      Call MD for:  redness, tenderness, or signs of infection (pain, swelling, redness, odor or green/yellow discharge around incision site)    Complete by:  As directed      Call MD for:  severe uncontrolled pain    Complete by:  As directed      Call MD for:  temperature >100.4    Complete by:  As directed      Diet - low sodium heart healthy    Complete by:  As directed      Discharge instructions    Complete by:  As directed   No strenuous activity, no heavy lifting, and no twisting or bending, no driving, may shower normally     Increase activity slowly    Complete by:  As directed            Follow-up Information   Follow up with Keiji Melland S, MD. Schedule an appointment as soon as possible for a visit in 2 weeks.   Specialty:  Neurosurgery   Contact  information:   [address] [place] [address] [phone]        Signed: Sherley Bounds  S 02/15/2014, 9:56 AM

## 2014-02-15 NOTE — Evaluation (Signed)
Occupational Therapy Evaluation Patient Details Name: [name] MRN: [id number] DOB: 07-28-1960 Today's Date: 02/15/2014    History of Present Illness Adm 02/14/14 for L2-3 PLIF. PMHx- OA; shoulder, neck, spine surgeries   Clinical Impression   Pt required mod A for LB ADLs and sup with functional mobility using RW. Pt and his wife educated on ADL A/E, DME for home use, donning/doffing spinal brace and back precautions with handout provided. Pt to d/c home today with his wife, all education completed and no further acute OT indicated.    Follow Up Recommendations  Home health OT    Equipment Recommendations  3 in 1 bedside comode;Other (comment) (ADL A/E kit)    Recommendations for Other Services       Precautions / Restrictions Precautions Precautions: Back Precaution Booklet Issued: Yes (comment) Precaution Comments: required cues for 3rd precaution (no arching) Required Braces or Orthoses: Spinal Brace Spinal Brace: Lumbar corset;Applied in sitting position Restrictions Weight Bearing Restrictions: Yes      Mobility Bed Mobility               General bed mobility comments: pt up in recliner, familair with log roll technique  Transfers Overall transfer level: Modified independent Equipment used: Rolling walker (2 wheeled)             General transfer comment: used correct technique    Balance Overall balance assessment: Needs assistance Sitting-balance support: No upper extremity supported;Feet supported Sitting balance-Leahy Scale: Good     Standing balance support: Single extremity supported;Bilateral upper extremity supported;During functional activity Standing balance-Leahy Scale: Fair                              ADL Overall ADL's : Needs assistance/impaired     Grooming: Wash/dry hands;Wash/dry face;Standing;Supervision/safety;Set up   Upper Body Bathing: Supervision/ safety;Set up;Sitting   Lower Body Bathing:  Moderate assistance   Upper Body Dressing : Supervision/safety;Set up;Sitting   Lower Body Dressing: Moderate assistance   Toilet Transfer: Supervision/safety;Comfort height toilet;Grab bars;RW   Toileting- Clothing Manipulation and Hygiene: Minimal assistance   Tub/ Shower Transfer: Supervision/safety;3 in 1   Functional mobility during ADLs: Supervision/safety General ADL Comments: pt and his wife provided with education and demo of ADL A/E, educated on DME for bathroom use at home. pt and wife able to donn/doff spinal brace     Vision  wears reading glasses                   Perception Perception Perception Tested?: No   Praxis Praxis Praxis tested?: Not tested    Pertinent Vitals/Pain Pain Assessment: 0-10 Pain Score: 6  Pain Location: back Pain Intervention(s): Monitored during session;Limited activity within patient's tolerance;Repositioned     Hand Dominance Right   Extremity/Trunk Assessment Upper Extremity Assessment Upper Extremity Assessment: Overall WFL for tasks assessed   Lower Extremity Assessment Lower Extremity Assessment: Defer to PT evaluation   Cervical / Trunk Assessment Cervical / Trunk Assessment: Normal   Communication Communication Communication: No difficulties   Cognition Arousal/Alertness: Awake/alert Behavior During Therapy: WFL for tasks assessed/performed Overall Cognitive Status: Within Functional Limits for tasks assessed                     General Comments   Pt very pleasant and cooperative                 Home Living Family/patient expects to be discharged  to:: Private residence Living Arrangements: Spouse/significant other Available Help at Discharge: Family;Available 24 hours/day Type of Home: House Home Access: Stairs to enter [employer] of Steps: 5 Entrance Stairs-Rails: Right;Left Home Layout: One level     Bathroom Shower/Tub: Occupational psychologist: Standard      Home Equipment: Cane - single point          Prior Functioning/Environment Level of Independence: Independent with assistive device(s)        Comments: infrequent use of cane    OT Diagnosis: Acute pain   OT Problem List: Decreased knowledge of use of DME or AE;Pain;Impaired balance (sitting and/or standing)   OT Treatment/Interventions:      OT Goals(Current goals can be found in the care plan section)    OT Frequency:     Barriers to D/C:  none                        End of Session Equipment Utilized During Treatment: Rolling walker;Other (comment) (3 in 1)  Activity Tolerance: Patient tolerated treatment well Patient left: in chair;with call bell/phone within reach;with family/visitor present   Time: 7371-0626 OT Time Calculation (min): 30 min Charges:  OT General Charges $OT Visit: 1 Procedure OT Evaluation $Initial OT Evaluation Tier I: 1 Procedure OT Treatments $Therapeutic Activity: 8-22 mins G-Codes:    Britt Bottom 02/15/2014, 12:25 PM

## 2014-02-15 NOTE — Evaluation (Signed)
Physical Therapy Evaluation Patient Details Name: [name] MRN: [id number] DOB: [birth year] Today's Date: 02/15/2014   History of Present Illness  Adm 02/14/14 for L2-3 PLIF. PMHx- OA; shoulder, neck, spine surgeries  Clinical Impression  Patient evaluated by Physical Therapy with no further acute PT needs identified. All education has been completed and the patient has no further questions.  See below for any follow-up Physial Therapy or equipment needs. PT is signing off. Thank you for this referral.     Follow Up Recommendations No PT follow up    Equipment Recommendations  Rolling walker with 5" wheels;3in1 (PT)    Recommendations for Other Services       Precautions / Restrictions Precautions Precautions: Back Precaution Booklet Issued: Yes (comment) Precaution Comments: required cues for 3rd precaution (no arching) Required Braces or Orthoses: Spinal Brace Spinal Brace: Lumbar corset;Applied in sitting position Restrictions Weight Bearing Restrictions: Yes      Mobility  Bed Mobility                  Transfers Overall transfer level: Modified independent Equipment used: Rolling walker (2 wheeled)             General transfer comment: used correct technique  Ambulation/Gait Ambulation/Gait assistance: Min guard;Supervision Ambulation Distance (Feet): 75 Feet Assistive device: Rolling walker (2 wheeled);None Gait Pattern/deviations: Step-through pattern;Decreased stride length     General Gait Details: pt asked to attempt ambulation without RW; noted incr pain without bil UE support (also discussed incr risk of fall while using stronger pain medicine); pt then walked with RW with improved step length and upright posture  Stairs Stairs:  (educated on techniques with one rail)          Wheelchair Mobility    Modified Rankin (Stroke Patients Only)       Balance                                              Pertinent Vitals/Pain Pain Assessment: 0-10 Pain Score: 4  Pain Location: back Pain Intervention(s): Limited activity within patient's tolerance;Monitored during session;Repositioned    Home Living Family/patient expects to be discharged to:: Private residence Living Arrangements: Spouse/significant other Available Help at Discharge: Family;Available 24 hours/day Type of Home: House Home Access: Stairs to enter Entrance Stairs-Rails: Psychiatric nurse of Steps: 5   Home Equipment: Cane - single point      Prior Function Level of Independence: Independent with assistive device(s)         Comments: infrequent use of cane     Hand Dominance        Extremity/Trunk Assessment   Upper Extremity Assessment: Defer to OT evaluation           Lower Extremity Assessment: Overall WFL for tasks assessed      Cervical / Trunk Assessment: Normal  Communication   Communication: No difficulties  Cognition Arousal/Alertness: Awake/alert Behavior During Therapy: WFL for tasks assessed/performed Overall Cognitive Status: Within Functional Limits for tasks assessed                      General Comments General comments (skin integrity, edema, etc.): Wife present throughout session.     Exercises        Assessment/Plan    PT Assessment Patent does not need any further PT services  PT Diagnosis Difficulty walking;Acute  pain   PT Problem List    PT Treatment Interventions     PT Goals (Current goals can be found in the Care Plan section) Acute Rehab PT Goals PT Goal Formulation: All assessment and education complete, DC therapy    Frequency     Barriers to discharge        Co-evaluation               End of Session Equipment Utilized During Treatment: Back brace Activity Tolerance: Patient tolerated treatment well Patient left: in chair;with call bell/phone within reach;with family/visitor present Nurse Communication:   (Case Manager re: equipment needs)         Time: 8469-6295 PT Time Calculation (min): 19 min   Charges:   PT Evaluation $Initial PT Evaluation Tier I: 1 Procedure PT Treatments $Gait Training: 8-22 mins   PT G Codes:          [name] 03/06/14, 12:18 PM Pager [phone]

## 2014-02-15 NOTE — Progress Notes (Signed)
[facility] nurse and nurse tech, Jonelle Sidle, ambulated pt from bed to chair. Pt stood at bedside for approx 2 minutes. No complaints of weakness/dizziness/lightheadedness. Pt tolerated well. Brace on when pt was up. Pt in chair with chair alarm. No distress noted. Wife at bedside. Will monitor   [name] I 02/15/2014 10:31 AM

## 2014-02-19 ENCOUNTER — Encounter (HOSPITAL_COMMUNITY): Payer: Self-pay | Admitting: Neurological Surgery

## 2014-03-14 ENCOUNTER — Encounter (HOSPITAL_BASED_OUTPATIENT_CLINIC_OR_DEPARTMENT_OTHER): Payer: BC Managed Care – PPO | Attending: Internal Medicine

## 2014-03-14 DIAGNOSIS — L723 Sebaceous cyst: Secondary | ICD-10-CM | POA: Diagnosis not present

## 2014-03-14 DIAGNOSIS — S31000D Unspecified open wound of lower back and pelvis without penetration into retroperitoneum, subsequent encounter: Secondary | ICD-10-CM | POA: Insufficient documentation

## 2014-03-14 DIAGNOSIS — S21201D Unspecified open wound of right back wall of thorax without penetration into thoracic cavity, subsequent encounter: Secondary | ICD-10-CM | POA: Diagnosis not present

## 2014-03-14 NOTE — Progress Notes (Signed)
[facility]  NAME:  [name], [name]            ACCOUNT NO.:  [id number]  MEDICAL RECORD NO.:  [id number]      DATE OF BIRTH:  04/29/1961  PHYSICIAN:  Judene Companion, M.D.      VISIT DATE:  03/14/2014                                  OFFICE VISIT   This is a 53 year old gentleman sent to [place] by his neurosurgeon by the name of Dr. Sherley Bounds.  He recently underwent a back operation for spondylosis and stenosis and degenerative disk disease of L2 and 3.  It was noticed that he had an abscess on his upper back away from the wound, presumably a sebaceous cyst.  He has had this opened and drained at least 2 times in the last couple of years and he has always had a recurrence.  When he came here today, he was complaining of pain and he had about a 2-cm draining abscess on his back.  His blood pressure was 122/75, pulse 80, temperature 99, he weighs 245 pounds, he is 6 feet tall.  He is otherwise healthy.  He takes Norco for pain since his surgery and he is also on Robaxin.  He says he is allergic to CEPHALOSPORIN.  On examination, he has in the midline of his back about the level of the thoracic 4, he has this 2 cm open draining area with hypertrophic granulation tissue.  I cleaned this out and got a lot of purulent material out, and I think part of the cyst wall.  I then used silver nitrate and cauterized the granulation tissue and then we put in Iodoform.  I told him to wash this wound with soap and water daily and his wife will dress it with the Iodoform and apply a leave-in bandage. He will come back in a week.  So, his diagnosis is open wound from previous abscess of a sebaceous cyst in the midline of his upper back.     Judene Companion, M.D.     PP/MEDQ  D:  03/14/2014  T:  03/14/2014  Job:  [id number]

## 2014-03-18 ENCOUNTER — Other Ambulatory Visit: Payer: Self-pay | Admitting: Neurological Surgery

## 2014-03-18 DIAGNOSIS — T8131XA Disruption of external operation (surgical) wound, not elsewhere classified, initial encounter: Secondary | ICD-10-CM | POA: Insufficient documentation

## 2014-03-20 ENCOUNTER — Encounter (HOSPITAL_COMMUNITY)
Admission: RE | Admit: 2014-03-20 | Discharge: 2014-03-20 | Disposition: A | Discharge status: Home / Self Care | Payer: BC Managed Care – PPO | Source: Ambulatory Visit | Attending: Neurological Surgery | Admitting: Neurological Surgery

## 2014-03-20 ENCOUNTER — Encounter (HOSPITAL_COMMUNITY): Payer: Self-pay

## 2014-03-20 DIAGNOSIS — Z792 Long term (current) use of antibiotics: Secondary | ICD-10-CM | POA: Diagnosis not present

## 2014-03-20 DIAGNOSIS — Z79891 Long term (current) use of opiate analgesic: Secondary | ICD-10-CM | POA: Diagnosis not present

## 2014-03-20 DIAGNOSIS — Z79899 Other long term (current) drug therapy: Secondary | ICD-10-CM | POA: Diagnosis not present

## 2014-03-20 DIAGNOSIS — K219 Gastro-esophageal reflux disease without esophagitis: Secondary | ICD-10-CM | POA: Diagnosis not present

## 2014-03-20 DIAGNOSIS — K635 Polyp of colon: Secondary | ICD-10-CM | POA: Diagnosis not present

## 2014-03-20 DIAGNOSIS — F17210 Nicotine dependence, cigarettes, uncomplicated: Secondary | ICD-10-CM | POA: Diagnosis not present

## 2014-03-20 DIAGNOSIS — M1388 Other specified arthritis, other site: Secondary | ICD-10-CM | POA: Diagnosis not present

## 2014-03-20 DIAGNOSIS — T8131XA Disruption of external operation (surgical) wound, not elsewhere classified, initial encounter: Secondary | ICD-10-CM | POA: Diagnosis present

## 2014-03-20 HISTORY — DX: Other complications of procedures, not elsewhere classified, initial encounter: T81.89XA

## 2014-03-20 LAB — CBC
HCT: 44.3 % (ref 39.0–52.0)
Hemoglobin: 14.6 g/dL (ref 13.0–17.0)
MCH: 30.4 pg (ref 26.0–34.0)
MCHC: 33 g/dL (ref 30.0–36.0)
MCV: 92.1 fL (ref 78.0–100.0)
Platelets: 238 10*3/uL (ref 150–400)
RBC: 4.81 MIL/uL (ref 4.22–5.81)
RDW: 13.5 % (ref 11.5–15.5)
WBC: 9.8 10*3/uL (ref 4.0–10.5)

## 2014-03-20 LAB — BASIC METABOLIC PANEL
Anion gap: 14 (ref 5–15)
BUN: 11 mg/dL (ref 6–23)
CO2: 24 mEq/L (ref 19–32)
Calcium: 10.1 mg/dL (ref 8.4–10.5)
Chloride: 102 mEq/L (ref 96–112)
Creatinine, Ser: 0.78 mg/dL (ref 0.50–1.35)
GFR calc Af Amer: >90 mL/min (ref >90)
GFR calc non Af Amer: >90 mL/min (ref >90)
Glucose, Bld: 98 mg/dL (ref 70–99)
Potassium: 5.5 mEq/L — ABNORMAL HIGH (ref 3.7–5.3)
Sodium: 140 mEq/L (ref 137–147)

## 2014-03-20 LAB — SURGICAL PCR SCREEN
MRSA, PCR: NEGATIVE
Staphylococcus aureus: NEGATIVE

## 2014-03-20 MED ORDER — VANCOMYCIN HCL 10 G IV SOLR
1500.0000 mg | INTRAVENOUS | Status: AC
  Administered 2014-03-21: 1500 mg via INTRAVENOUS
  Filled 2014-03-20: qty 1500

## 2014-03-20 MED ORDER — DEXAMETHASONE SODIUM PHOSPHATE 10 MG/ML IJ SOLN
10.0000 mg | INTRAMUSCULAR | Status: DC
  Filled 2014-03-20: qty 1

## 2014-03-20 NOTE — Pre-Procedure Instructions (Addendum)
[name]  03/20/2014   Your procedure is scheduled on:  Thursday, March 21, 2014  Report to [facility] Admitting at 11:00 AM.  Call this number if you have problems the morning of surgery: [phone]   Remember:   Do not eat food or drink liquids after midnight.   Take these medicines the morning of surgery with A SIP OF WATER: doxycycline (VIBRA-TABS if needed:oxyCODONE (PERCOCET) for pain Stop taking Aspirin, vitamins, and herbal medications. Do not take any NSAIDs ie: Ibuprofen, Advil, Naproxen or any medication containing Aspirin such as meloxicam (MOBIC).  Do not wear jewelry, make-up or nail polish.  Do not wear lotions, powders, or perfumes. You may not wear deodorant.  Do not shave 48 hours prior to surgery. Men may shave face and neck.  Do not bring valuables to the hospital.  [facility] is not responsible for any belongings or valuables.               Contacts, dentures or bridgework may not be worn into surgery.  Leave suitcase in the car. After surgery it may be brought to your room.  For patients admitted to the hospital, discharge time is determined by your treatment team.               Patients discharged the day of surgery will not be allowed to drive home.  Name and phone number of your driver:   Special Instructions:  Special Instructions:Special Instructions: [facility] - Preparing for Surgery  Before surgery, you can play an important role.  Because skin is not sterile, your skin needs to be as free of germs as possible.  You can reduce the number of germs on you skin by washing with CHG (chlorahexidine gluconate) soap before surgery.  CHG is an antiseptic cleaner which kills germs and bonds with the skin to continue killing germs even after washing.  Please DO NOT use if you have an allergy to CHG or antibacterial soaps.  If your skin becomes reddened/irritated stop using the CHG and inform your nurse when you arrive at Short Stay.  Do  not shave (including legs and underarms) for at least 48 hours prior to the first CHG shower.  You may shave your face.  Please follow these instructions carefully:   1.  Shower with CHG Soap the night before surgery and the morning of Surgery.  2.  If you choose to wash your hair, wash your hair first as usual with your normal shampoo.  3.  After you shampoo, rinse your hair and body thoroughly to remove the Shampoo.  4.  Use CHG as you would any other liquid soap.  You can apply chg directly  to the skin and wash gently with scrungie or a clean washcloth.  5.  Apply the CHG Soap to your body ONLY FROM THE NECK DOWN.  Do not use on open wounds or open sores.  Avoid contact with your eyes, ears, mouth and genitals (private parts).  Wash genitals (private parts) with your normal soap.  6.  Wash thoroughly, paying special attention to the area where your surgery will be performed.  7.  Thoroughly rinse your body with warm water from the neck down.  8.  DO NOT shower/wash with your normal soap after using and rinsing off the CHG Soap.  9.  Pat yourself dry with a clean towel.            10.  Wear clean pajamas.  11.  Place clean sheets on your bed the night of your first shower and do not sleep with pets.  Day of Surgery  Do not apply any lotions/deodorants the morning of surgery.  Please wear clean clothes to the hospital/surgery center.   Please read over the following fact sheets that you were given: Pain Booklet, Coughing and Deep Breathing, MRSA Information and Surgical Site Infection Prevention

## 2014-03-20 NOTE — Progress Notes (Signed)
   03/20/14 1530  OBSTRUCTIVE SLEEP APNEA  Have you ever been diagnosed with sleep apnea through a sleep study? No  Do you snore loudly (loud enough to be heard through closed doors)?  1  Do you often feel tired, fatigued, or sleepy during the daytime? 1  Has anyone observed you stop breathing during your sleep? 0  Do you have, or are you being treated for high blood pressure? 0  BMI more than 35 kg/m2? 0  Age over 52 years old? 1  Neck circumference greater than 40 cm/16 inches? 1  Gender: 1  Obstructive Sleep Apnea Score 5

## 2014-03-21 ENCOUNTER — Ambulatory Visit (HOSPITAL_COMMUNITY): Payer: BC Managed Care – PPO | Admitting: Anesthesiology

## 2014-03-21 ENCOUNTER — Encounter (HOSPITAL_COMMUNITY): Payer: Self-pay | Admitting: *Deleted

## 2014-03-21 ENCOUNTER — Encounter (HOSPITAL_COMMUNITY)
Admission: RE | Disposition: A | Discharge status: Home / Self Care | Payer: Self-pay | Source: Ambulatory Visit | Attending: Neurological Surgery

## 2014-03-21 ENCOUNTER — Ambulatory Visit (HOSPITAL_COMMUNITY)
Admission: RE | Admit: 2014-03-21 | Discharge: 2014-03-21 | Disposition: A | Discharge status: Home / Self Care | Payer: BC Managed Care – PPO | Source: Ambulatory Visit | Attending: Neurological Surgery | Admitting: Neurological Surgery

## 2014-03-21 ENCOUNTER — Ambulatory Visit (HOSPITAL_COMMUNITY): Payer: BC Managed Care – PPO | Admitting: Vascular Surgery

## 2014-03-21 DIAGNOSIS — M1388 Other specified arthritis, other site: Secondary | ICD-10-CM | POA: Insufficient documentation

## 2014-03-21 DIAGNOSIS — Z79891 Long term (current) use of opiate analgesic: Secondary | ICD-10-CM | POA: Insufficient documentation

## 2014-03-21 DIAGNOSIS — T8131XA Disruption of external operation (surgical) wound, not elsewhere classified, initial encounter: Secondary | ICD-10-CM | POA: Insufficient documentation

## 2014-03-21 DIAGNOSIS — Z79899 Other long term (current) drug therapy: Secondary | ICD-10-CM | POA: Insufficient documentation

## 2014-03-21 DIAGNOSIS — K635 Polyp of colon: Secondary | ICD-10-CM | POA: Insufficient documentation

## 2014-03-21 DIAGNOSIS — K219 Gastro-esophageal reflux disease without esophagitis: Secondary | ICD-10-CM | POA: Insufficient documentation

## 2014-03-21 DIAGNOSIS — F17210 Nicotine dependence, cigarettes, uncomplicated: Secondary | ICD-10-CM | POA: Insufficient documentation

## 2014-03-21 DIAGNOSIS — Z792 Long term (current) use of antibiotics: Secondary | ICD-10-CM | POA: Insufficient documentation

## 2014-03-21 HISTORY — PX: LUMBAR WOUND DEBRIDEMENT: SHX1988

## 2014-03-21 SURGERY — LUMBAR WOUND DEBRIDEMENT
Anesthesia: General | Site: Spine Lumbar

## 2014-03-21 MED ORDER — DOXYCYCLINE HYCLATE 100 MG PO TABS
100.0000 mg | ORAL_TABLET | Freq: Two times a day (BID) | ORAL | Status: DC
  Administered 2014-03-21: 100 mg via ORAL
  Filled 2014-03-21: qty 1

## 2014-03-21 MED ORDER — THROMBIN 5000 UNITS EX SOLR
CUTANEOUS | Status: DC | PRN
  Administered 2014-03-21 (×2): 5000 [IU] via TOPICAL

## 2014-03-21 MED ORDER — MENTHOL 3 MG MT LOZG: 1.0000 | LOZENGE | OROMUCOSAL | Status: DC | PRN

## 2014-03-21 MED ORDER — OXYCODONE-ACETAMINOPHEN 5-325 MG PO TABS
ORAL_TABLET | ORAL | Status: AC
  Filled 2014-03-21: qty 1

## 2014-03-21 MED ORDER — LABETALOL HCL 5 MG/ML IV SOLN
INTRAVENOUS | Status: AC
  Filled 2014-03-21: qty 4

## 2014-03-21 MED ORDER — HYDROMORPHONE HCL 1 MG/ML IJ SOLN
INTRAMUSCULAR | Status: AC
  Filled 2014-03-21: qty 1

## 2014-03-21 MED ORDER — GLYCOPYRROLATE 0.2 MG/ML IJ SOLN
INTRAMUSCULAR | Status: AC
  Filled 2014-03-21: qty 3

## 2014-03-21 MED ORDER — MORPHINE SULFATE 2 MG/ML IJ SOLN
INTRAMUSCULAR | Status: AC
  Filled 2014-03-21: qty 1

## 2014-03-21 MED ORDER — SODIUM CHLORIDE 0.9 % IJ SOLN: 3.0000 mL | Freq: Two times a day (BID) | INTRAMUSCULAR | Status: DC

## 2014-03-21 MED ORDER — ONDANSETRON HCL 4 MG/2ML IJ SOLN
INTRAMUSCULAR | Status: DC | PRN
  Administered 2014-03-21: 4 mg via INTRAVENOUS

## 2014-03-21 MED ORDER — BUPIVACAINE HCL (PF) 0.25 % IJ SOLN
INTRAMUSCULAR | Status: DC | PRN
  Administered 2014-03-21: 3 mL

## 2014-03-21 MED ORDER — MIDAZOLAM HCL 5 MG/5ML IJ SOLN
INTRAMUSCULAR | Status: DC | PRN
  Administered 2014-03-21 (×2): 1 mg via INTRAVENOUS

## 2014-03-21 MED ORDER — ONDANSETRON HCL 4 MG/2ML IJ SOLN
4.0000 mg | INTRAMUSCULAR | Status: DC | PRN
  Filled 2014-03-21: qty 2

## 2014-03-21 MED ORDER — LACTATED RINGERS IV SOLN
INTRAVENOUS | Status: DC
  Administered 2014-03-21: 11:00:00 via INTRAVENOUS

## 2014-03-21 MED ORDER — ACETAMINOPHEN 650 MG RE SUPP
650.0000 mg | RECTAL | Status: DC | PRN
  Filled 2014-03-21: qty 1

## 2014-03-21 MED ORDER — 0.9 % SODIUM CHLORIDE (POUR BTL) OPTIME
TOPICAL | Status: DC | PRN
  Administered 2014-03-21: 1000 mL

## 2014-03-21 MED ORDER — HEMOSTATIC AGENTS (NO CHARGE) OPTIME
TOPICAL | Status: DC | PRN
  Administered 2014-03-21: 1 via TOPICAL

## 2014-03-21 MED ORDER — MIDAZOLAM HCL 2 MG/2ML IJ SOLN
INTRAMUSCULAR | Status: AC
  Filled 2014-03-21: qty 2

## 2014-03-21 MED ORDER — SODIUM CHLORIDE 0.9 % IJ SOLN: 3.0000 mL | INTRAMUSCULAR | Status: DC | PRN

## 2014-03-21 MED ORDER — POTASSIUM CHLORIDE IN NACL 20-0.9 MEQ/L-% IV SOLN
INTRAVENOUS | Status: DC
  Filled 2014-03-21 (×2): qty 1000

## 2014-03-21 MED ORDER — NEOSTIGMINE METHYLSULFATE 10 MG/10ML IV SOLN
INTRAVENOUS | Status: DC | PRN
  Administered 2014-03-21: 4 mg via INTRAVENOUS

## 2014-03-21 MED ORDER — METHOCARBAMOL 500 MG PO TABS
750.0000 mg | ORAL_TABLET | Freq: Three times a day (TID) | ORAL | Status: DC | PRN
  Administered 2014-03-21: 750 mg via ORAL
  Filled 2014-03-21: qty 1

## 2014-03-21 MED ORDER — PROPOFOL 10 MG/ML IV BOLUS
INTRAVENOUS | Status: AC
  Filled 2014-03-21: qty 20

## 2014-03-21 MED ORDER — ONDANSETRON HCL 4 MG/2ML IJ SOLN
INTRAMUSCULAR | Status: AC
  Filled 2014-03-21: qty 2

## 2014-03-21 MED ORDER — OXYCODONE-ACETAMINOPHEN 5-325 MG PO TABS
1.0000 | ORAL_TABLET | ORAL | Status: DC | PRN
Start: 2014-03-21 — End: 2014-03-21
  Administered 2014-03-21 (×2): 2 via ORAL
  Filled 2014-03-21: qty 2

## 2014-03-21 MED ORDER — HYDROMORPHONE HCL 1 MG/ML IJ SOLN
0.2500 mg | INTRAMUSCULAR | Status: DC | PRN
  Administered 2014-03-21 (×4): 0.5 mg via INTRAVENOUS

## 2014-03-21 MED ORDER — ACETAMINOPHEN 325 MG PO TABS
650.0000 mg | ORAL_TABLET | ORAL | Status: DC | PRN
  Filled 2014-03-21: qty 2

## 2014-03-21 MED ORDER — ARTIFICIAL TEARS OP OINT
TOPICAL_OINTMENT | OPHTHALMIC | Status: DC | PRN
  Administered 2014-03-21: 1 via OPHTHALMIC

## 2014-03-21 MED ORDER — LIDOCAINE HCL (CARDIAC) 20 MG/ML IV SOLN
INTRAVENOUS | Status: AC
  Filled 2014-03-21: qty 5

## 2014-03-21 MED ORDER — FENTANYL CITRATE 0.05 MG/ML IJ SOLN
INTRAMUSCULAR | Status: DC | PRN
  Administered 2014-03-21: 75 ug via INTRAVENOUS
  Administered 2014-03-21: 50 ug via INTRAVENOUS
  Administered 2014-03-21: 75 ug via INTRAVENOUS
  Administered 2014-03-21: 50 ug via INTRAVENOUS

## 2014-03-21 MED ORDER — MORPHINE SULFATE 2 MG/ML IJ SOLN
1.0000 mg | INTRAMUSCULAR | Status: DC | PRN
  Administered 2014-03-21: 2 mg via INTRAVENOUS
  Filled 2014-03-21: qty 2

## 2014-03-21 MED ORDER — GLYCOPYRROLATE 0.2 MG/ML IJ SOLN
INTRAMUSCULAR | Status: DC | PRN
  Administered 2014-03-21: 0.6 mg via INTRAVENOUS

## 2014-03-21 MED ORDER — ROCURONIUM BROMIDE 100 MG/10ML IV SOLN
INTRAVENOUS | Status: DC | PRN
  Administered 2014-03-21: 50 mg via INTRAVENOUS

## 2014-03-21 MED ORDER — FENTANYL CITRATE 0.05 MG/ML IJ SOLN
INTRAMUSCULAR | Status: AC
  Filled 2014-03-21: qty 5

## 2014-03-21 MED ORDER — SODIUM CHLORIDE 0.9 % IR SOLN
Status: DC | PRN
  Administered 2014-03-21: 500 mL

## 2014-03-21 MED ORDER — LACTATED RINGERS IV SOLN
INTRAVENOUS | Status: DC | PRN
  Administered 2014-03-21: 13:00:00 via INTRAVENOUS

## 2014-03-21 MED ORDER — PHENOL 1.4 % MT LIQD: 1.0000 | OROMUCOSAL | Status: DC | PRN

## 2014-03-21 MED ORDER — ROCURONIUM BROMIDE 50 MG/5ML IV SOLN
INTRAVENOUS | Status: AC
  Filled 2014-03-21: qty 1

## 2014-03-21 MED ORDER — LIDOCAINE HCL (CARDIAC) 20 MG/ML IV SOLN
INTRAVENOUS | Status: DC | PRN
  Administered 2014-03-21: 70 mg via INTRAVENOUS

## 2014-03-21 MED ORDER — PROPOFOL 10 MG/ML IV BOLUS
INTRAVENOUS | Status: DC | PRN
  Administered 2014-03-21: 200 mg via INTRAVENOUS
  Administered 2014-03-21: 50 mg via INTRAVENOUS

## 2014-03-21 SURGICAL SUPPLY — 46 items
APL SKNCLS STERI-STRIP NONHPOA (GAUZE/BANDAGES/DRESSINGS) ×2
BAG DECANTER FOR FLEXI CONT (MISCELLANEOUS) ×3 IMPLANT
BENZOIN TINCTURE PRP APPL 2/3 (GAUZE/BANDAGES/DRESSINGS) ×3 IMPLANT
CANISTER SUCT 3000ML (MISCELLANEOUS) ×2 IMPLANT
DRAPE LAPAROTOMY 100X72X124 (DRAPES) ×2 IMPLANT
DRAPE POUCH INSTRU U-SHP 10X18 (DRAPES) ×2 IMPLANT
DRSG OPSITE 4X5.5 SM (GAUZE/BANDAGES/DRESSINGS) ×3 IMPLANT
DRSG TELFA 3X8 NADH (GAUZE/BANDAGES/DRESSINGS) ×2 IMPLANT
DURAPREP 26ML APPLICATOR (WOUND CARE) ×1 IMPLANT
DURAPREP 6ML APPLICATOR 50/CS (WOUND CARE) IMPLANT
ELECT REM PT RETURN 9FT ADLT (ELECTROSURGICAL) ×2
ELECTRODE REM PT RTRN 9FT ADLT (ELECTROSURGICAL) ×1 IMPLANT
GAUZE SPONGE 4X4 16PLY XRAY LF (GAUZE/BANDAGES/DRESSINGS) IMPLANT
GLOVE BIO SURGEON STRL SZ8 (GLOVE) ×2 IMPLANT
GLOVE BIOGEL PI IND STRL 7.0 (GLOVE) IMPLANT
GLOVE BIOGEL PI INDICATOR 7.0 (GLOVE) ×2
GLOVE SS BIOGEL STRL SZ 6.5 (GLOVE) IMPLANT
GLOVE SUPERSENSE BIOGEL SZ 6.5 (GLOVE) ×2
GOWN STRL REUS W/ TWL LRG LVL3 (GOWN DISPOSABLE) IMPLANT
GOWN STRL REUS W/ TWL XL LVL3 (GOWN DISPOSABLE) ×1 IMPLANT
GOWN STRL REUS W/TWL 2XL LVL3 (GOWN DISPOSABLE) ×1 IMPLANT
GOWN STRL REUS W/TWL LRG LVL3 (GOWN DISPOSABLE) ×2
GOWN STRL REUS W/TWL XL LVL3 (GOWN DISPOSABLE) ×2
KIT BASIN OR (CUSTOM PROCEDURE TRAY) ×2 IMPLANT
KIT ROOM TURNOVER OR (KITS) ×2 IMPLANT
LIQUID BAND (GAUZE/BANDAGES/DRESSINGS) ×1 IMPLANT
NDL HYPO 18GX1.5 BLUNT FILL (NEEDLE) IMPLANT
NEEDLE HYPO 18GX1.5 BLUNT FILL (NEEDLE) IMPLANT
NEEDLE HYPO 25X1 1.5 SAFETY (NEEDLE) ×2 IMPLANT
NEEDLE SPNL 20GX3.5 QUINCKE YW (NEEDLE) IMPLANT
NS IRRIG 1000ML POUR BTL (IV SOLUTION) ×2 IMPLANT
PACK LAMINECTOMY NEURO (CUSTOM PROCEDURE TRAY) ×2 IMPLANT
PAD ARMBOARD 7.5X6 YLW CONV (MISCELLANEOUS) ×8 IMPLANT
PAD DRESSING TELFA 3X8 NADH (GAUZE/BANDAGES/DRESSINGS) ×1 IMPLANT
STRIP CLOSURE SKIN 1/2X4 (GAUZE/BANDAGES/DRESSINGS) ×3 IMPLANT
SUT VIC AB 0 CT1 18XCR BRD8 (SUTURE) ×1 IMPLANT
SUT VIC AB 0 CT1 8-18 (SUTURE) ×2
SUT VIC AB 2-0 CP2 18 (SUTURE) ×3 IMPLANT
SUT VIC AB 3-0 SH 8-18 (SUTURE) ×4 IMPLANT
SWAB COLLECTION DEVICE MRSA (MISCELLANEOUS) ×1 IMPLANT
SYR 20ML ECCENTRIC (SYRINGE) ×2 IMPLANT
SYR 3ML LL SCALE MARK (SYRINGE) IMPLANT
TOWEL OR 17X24 6PK STRL BLUE (TOWEL DISPOSABLE) ×2 IMPLANT
TOWEL OR 17X26 10 PK STRL BLUE (TOWEL DISPOSABLE) ×2 IMPLANT
TUBE ANAEROBIC SPECIMEN COL (MISCELLANEOUS) ×1 IMPLANT
WATER STERILE IRR 1000ML POUR (IV SOLUTION) ×2 IMPLANT

## 2014-03-21 NOTE — Anesthesia Preprocedure Evaluation (Addendum)
Anesthesia Evaluation  Patient identified by MRN, date of birth, ID band Patient awake    Reviewed: Allergy & Precautions, H&P , NPO status , Patient's Chart, lab work & pertinent test results  Airway Mallampati: III  TM Distance: >3 FB Neck ROM: Full    Dental  (+) Teeth Intact, Dental Advisory Given   Pulmonary neg pulmonary ROS, Current Smoker,          Cardiovascular negative cardio ROS      Neuro/Psych    GI/Hepatic Neg liver ROS, GERD-  ,  Endo/Other    Renal/GU negative Renal ROS     Musculoskeletal  (+) Arthritis -,   Abdominal   Peds  Hematology   Anesthesia Other Findings   Reproductive/Obstetrics                            Anesthesia Physical Anesthesia Plan  ASA: II  Anesthesia Plan: General   Post-op Pain Management:    Induction: Intravenous  Airway Management Planned: Oral ETT  Additional Equipment:   Intra-op Plan:   Post-operative Plan: Extubation in OR  Informed Consent: I have reviewed the patients History and Physical, chart, labs and discussed the procedure including the risks, benefits and alternatives for the proposed anesthesia with the patient or authorized representative who has indicated his/her understanding and acceptance.     Plan Discussed with: CRNA and Anesthesiologist  Anesthesia Plan Comments:         Anesthesia Quick Evaluation

## 2014-03-21 NOTE — Op Note (Signed)
03/21/2014  3:22 PM  PATIENT:  [name]  53 y.o. male  PRE-OPERATIVE DIAGNOSIS:  Lumbar and thoracic wound dehiscence  POST-OPERATIVE DIAGNOSIS:  Same  PROCEDURE:  Lumbar and thoracic wound revision with irrigation, debridement and primary closure  SURGEON:  Sherley Bounds, MD  ASSISTANTS: None  ANESTHESIA:   General  EBL: Minimal ml  Total I/O In: 700 [I.V.:700] Out: -   BLOOD ADMINISTERED:none  DRAINS: None   SPECIMEN:  No Specimen  INDICATION FOR PROCEDURE: This patient underwent a posterior lumbar interbody fusion 4 weeks ago as well as resection of a sebaceous cyst in his thoracic region. He presented to the office with a superficial wound dehiscence. They had been to the wound care clinic and were packing the thoracic wound. There is no drainage or fever or evidence of infection. Recommended wound revision with primary closure of the wound rather than furl to the wound care clinic for treatment and secondary closure. Patient understood the risks, benefits, and alternatives and potential outcomes and wished to proceed.  PROCEDURE DETAILS: The patient was taken to the operating room and after induction of adequate generalized endotracheal anesthesia he was rolled to the prone position on the chest rolls were all pressure points were padded. his thoracic and lumbar areas were cleaned with Betadine and then prepped with DuraPrep and draped in usual sterile fashion. 3 mL local anesthesia was injected and his old incisions were both ellipsed out down through the subcutaneous tissues but not to the fascia. This took the tissues back to nice bleeding healthy-looking tissues of both areas. I found no evidence of significant infection. I irrigated with saline solution contained bacitracin. Not all bleeding points. I then closed the subcutaneous tissues with 2-0 Vicryl. I closed the subcuticular tissue with 3-0 Vicryl. The skin was closed with benzoin Steri-Strips. Sterile  dressings were applied. The patient was awakened from general anesthesia and transferred to recovery in stable condition. At the end the procedure all sponge and needle counts were correct.  PLAN OF CARE: Discharge to home after PACU  PATIENT DISPOSITION:  PACU - hemodynamically stable.   Delay start of Pharmacological VTE agent (>24hrs) due to surgical blood loss or risk of bleeding:  yes

## 2014-03-21 NOTE — Transfer of Care (Signed)
Immediate Anesthesia Transfer of Care Note  Patient: [name]  Procedure(s) Performed: Procedure(s): LUMBAR WOUND DEBRIDEMENT (N/A)  Patient Location: PACU  Anesthesia Type:General  Level of Consciousness: awake, alert  and oriented  Airway & Oxygen Therapy: Patient Spontanous Breathing and Patient connected to nasal cannula oxygen  Post-op Assessment: Report given to PACU RN and Post -op Vital signs reviewed and stable  Post vital signs: Reviewed and stable  Complications: No apparent anesthesia complications

## 2014-03-21 NOTE — Anesthesia Procedure Notes (Signed)
Procedure Name: Intubation Date/Time: 03/21/2014 1:28 PM Performed by: Susa Loffler Pre-anesthesia Checklist: Patient identified, Timeout performed, Emergency Drugs available, Suction available and Patient being monitored Patient Re-evaluated:Patient Re-evaluated prior to inductionOxygen Delivery Method: Circle system utilized Preoxygenation: Pre-oxygenation with 100% oxygen Intubation Type: IV induction Ventilation: Mask ventilation without difficulty and Oral airway inserted - appropriate to patient size Grade View: Grade I Tube type: Oral Tube size: 7.5 mm Number of attempts: 1 Airway Equipment and Method: Stylet and Video-laryngoscopy Placement Confirmation: ETT inserted through vocal cords under direct vision,  positive ETCO2 and breath sounds checked- equal and bilateral Secured at: 23 cm Tube secured with: Tape Dental Injury: Teeth and Oropharynx as per pre-operative assessment  Difficulty Due To: Difficult Airway- due to anterior larynx

## 2014-03-21 NOTE — Plan of Care (Signed)
Problem: Consults Goal: Spinal Surgery Patient Education See Patient Education Module for education specifics. Outcome: Completed/Met Date Met:  03/21/14     

## 2014-03-21 NOTE — Plan of Care (Signed)
Problem: Consults Goal: Diagnosis - Spinal Surgery Outcome: Completed/Met Date Met:  03/21/14 LUMBAR WOUND DEBRIDEMENT

## 2014-03-21 NOTE — Progress Notes (Signed)
PT. UP AD LIB IN HALL WITH STEADY GAIT, TOLERATING DIET AND PO MEDICATION, AND VOIDING WITHOUT DIFFICULTIES. PT. AND SPOUSE VERBALIZED DISCHARGE ORDERS. V/S STABLE. PT. DISCHARGED VIA W/C WITH STAFF AND SPOUSE ASSISTING PT. TO CAR. NO DISTRESS NOTED.  [place] RN

## 2014-03-21 NOTE — H&P (Signed)
Subjective: Patient is a 53 y.o. male admitted for lumbar wound revision. Onset of symptoms was a few days ago, stable since that time.  The pain is rated mild, and is located at the across the lower back without radiation. The pain is described as aching and occurs intermittently. Symptoms are exacerbated by nothing in particular.  He presented to the office and on wound check had a minor dehiscence of his wound measuring about 3 mm deep but the length of the incision. There was minimal drainage. There is no redness or tenderness.   Past Medical History  Diagnosis Date  . Arthritis     Back  . Colon polyps   . GERD (gastroesophageal reflux disease)     every now and then  . Lumbar surgical wound fluid collection     lumbar wound dehiscence    Past Surgical History  Procedure Laterality Date  . Eye surgery    . Shoulder surgery    . Neck surgery    . Spine surgery    . Tonsillectomy    . Maximum access (mas)posterior lumbar interbody fusion (plif) 1 level N/A 02/14/2014    Procedure: FOR MAXIMUM ACCESS SURGERY POSTERIOR LUMBAR INTERBODY FUSION LUMBAR TWO-THREE;  Surgeon: Eustace Moore, MD;  Location: [facility];  Service: Neurosurgery;  Laterality: N/A;  . Colonoscopy w/ biopsies and polypectomy      Prior to Admission medications   Medication Sig Start Date End Date Taking? Authorizing Provider  doxycycline (VIBRA-TABS) 100 MG tablet Take 100 mg by mouth 2 (two) times daily. Started 03/18/14 for 10 days   Yes Historical Provider, MD  meloxicam (MOBIC) 15 MG tablet Take 15 mg by mouth daily as needed for pain.   Yes Historical Provider, MD  methocarbamol (ROBAXIN) 750 MG tablet Take 750 mg by mouth every 8 (eight) hours as needed for muscle spasms.   Yes Historical Provider, MD  oxyCODONE-acetaminophen (PERCOCET) 10-325 MG per tablet Take 1-2 tablets by mouth every 6 (six) hours as needed for pain.   Yes Historical Provider, MD  methocarbamol (ROBAXIN) 500 MG tablet Take 1 tablet (500  mg total) by mouth every 6 (six) hours as needed for muscle spasms. Patient not taking: Reported on 03/19/2014 02/15/14   Eustace Moore, MD  oxyCODONE-acetaminophen (PERCOCET/ROXICET) 5-325 MG per tablet Take 1-2 tablets by mouth every 4 (four) hours as needed for moderate pain. Patient not taking: Reported on 03/19/2014 02/15/14   Eustace Moore, MD   Allergies  Allergen Reactions  . Cefuroxime Axetil     REACTION: hives    History  Substance Use Topics  . Smoking status: Current Every Day Smoker -- 0.50 packs/day for 30 years    Types: Cigarettes    Last Attempt to Quit: 01/06/2012  . Smokeless tobacco: Current User    Types: Snuff  . Alcohol Use: 12.0 oz/week    24 Not specified per week     Comment: social beer    Family History  Problem Relation Age of Onset  . Cancer Father 23    Unknown  . Hypertension Mother   . Colon polyps Brother   . Aneurysm Maternal Aunt 58    brain  . Heart disease Maternal Aunt     d 18  . Cancer Paternal Uncle 48    stomach  . Aneurysm Maternal Grandfather     stomach  . Cancer Paternal Grandfather 47    stomach  . Depression Brother 20    suicide  .  Cancer Paternal Uncle 60    stomach     Review of Systems  Positive ROS: neg  All other systems have been reviewed and were otherwise negative with the exception of those mentioned in the HPI and as above.  Objective: Vital signs in last 24 hours: Temp:  [98.7 F (37.1 C)-99.2 F (37.3 C)] 98.7 F (37.1 C) (11/19 1115) Pulse Rate:  [77-87] 87 (11/19 1115) Resp:  [20] 20 (11/19 1115) BP: (116-120)/(72-76) 116/76 mmHg (11/19 1115) SpO2:  [96 %-99 %] 99 % (11/19 1115) Weight:  [249 lb 1.6 oz (112.991 kg)] 249 lb 1.6 oz (112.991 kg) (11/19 1115)  General Appearance: Alert, cooperative, no distress, appears stated age Head: Normocephalic, without obvious abnormality, atraumatic Eyes: PERRL, conjunctiva/corneas clear, EOM's intact    Neck: Supple, symmetrical, trachea  midline Back: Symmetric, no curvature, ROM normal, no CVA tenderness Lungs:  respirations unlabored Heart: Regular rate and rhythm Abdomen: Soft, non-tender Extremities: Extremities normal, atraumatic, no cyanosis or edema Pulses: 2+ and symmetric all extremities Skin: Skin color, texture, turgor normal, no rashes or lesions  NEUROLOGIC:   Mental status: Alert and oriented x4,  no aphasia, good attention span, fund of knowledge, and memory Motor Exam - grossly normal Sensory Exam - grossly normal Reflexes: 1+ Coordination - grossly normal Gait - grossly normal Balance - grossly normal Cranial Nerves: I: smell Not tested  II: visual acuity  OS: nl    OD: nl  II: visual fields Full to confrontation  II: pupils Equal, round, reactive to light  III,VII: ptosis None  III,IV,VI: extraocular muscles  Full ROM  V: mastication Normal  V: facial light touch sensation  Normal  V,VII: corneal reflex  Present  VII: facial muscle function - upper  Normal  VII: facial muscle function - lower Normal  VIII: hearing Not tested  IX: soft palate elevation  Normal  IX,X: gag reflex Present  XI: trapezius strength  5/5  XI: sternocleidomastoid strength 5/5  XI: neck flexion strength  5/5  XII: tongue strength  Normal    Data Review Lab Results  Component Value Date   WBC 9.8 03/20/2014   HGB 14.6 03/20/2014   HCT 44.3 03/20/2014   MCV 92.1 03/20/2014   PLT 238 03/20/2014   Lab Results  Component Value Date   NA 140 03/20/2014   K 5.5* 03/20/2014   CL 102 03/20/2014   CO2 24 03/20/2014   BUN 11 03/20/2014   CREATININE 0.78 03/20/2014   GLUCOSE 98 03/20/2014   Lab Results  Component Value Date   INR 0.95 02/14/2014    Assessment/Plan: Patient admitted for lumbar wound revision for superficial wound dehiscence. Patient has failed a reasonable attempt at conservative therapy.  I explained the condition and procedure to the patient and answered any questions.  Patient wishes to  proceed with procedure as planned. Understands risks/ benefits and typical outcomes of procedure.   Yossef Gilkison S 03/21/2014 1:19 PM

## 2014-03-21 NOTE — Anesthesia Postprocedure Evaluation (Signed)
  Anesthesia Post-op Note  Patient: [name]  Procedure(s) Performed: Procedure(s): LUMBAR WOUND DEBRIDEMENT (N/A)  Patient Location: PACU  Anesthesia Type:General  Level of Consciousness: awake  Airway and Oxygen Therapy: Patient Spontanous Breathing  Post-op Pain: mild  Post-op Assessment: Post-op Vital signs reviewed  Post-op Vital Signs: Reviewed  Last Vitals:  Filed Vitals:   03/21/14 1532  BP: 143/68  Pulse: 72  Temp:   Resp: 8    Complications: No apparent anesthesia complications

## 2014-03-22 ENCOUNTER — Encounter (HOSPITAL_COMMUNITY): Payer: Self-pay | Admitting: Neurological Surgery

## 2014-04-02 DIAGNOSIS — Z9889 Other specified postprocedural states: Secondary | ICD-10-CM | POA: Insufficient documentation

## 2014-06-27 ENCOUNTER — Encounter: Payer: Self-pay | Admitting: *Deleted

## 2014-07-12 ENCOUNTER — Encounter: Payer: Self-pay | Admitting: Gastroenterology

## 2014-07-12 ENCOUNTER — Encounter: Payer: BC Managed Care – PPO | Admitting: Family Medicine

## 2014-07-18 ENCOUNTER — Encounter: Payer: BC Managed Care – PPO | Admitting: Family Medicine

## 2014-07-22 ENCOUNTER — Telehealth: Payer: Self-pay | Admitting: Family Medicine

## 2014-07-22 ENCOUNTER — Encounter: Payer: Self-pay | Admitting: Family Medicine

## 2014-07-22 ENCOUNTER — Ambulatory Visit (INDEPENDENT_AMBULATORY_CARE_PROVIDER_SITE_OTHER): Payer: BLUE CROSS/BLUE SHIELD | Admitting: Family Medicine

## 2014-07-22 VITALS — BP 110/78 | HR 93 | Temp 98.2°F | Ht 71.42 in | Wt 256.8 lb

## 2014-07-22 DIAGNOSIS — Z0000 Encounter for general adult medical examination without abnormal findings: Secondary | ICD-10-CM | POA: Diagnosis not present

## 2014-07-22 LAB — HEPATIC FUNCTION PANEL
ALT: 22 U/L (ref 0–53)
AST: 14 U/L (ref 0–37)
Albumin: 4.5 g/dL (ref 3.5–5.2)
Alkaline Phosphatase: 78 U/L (ref 39–117)
Bilirubin, Direct: 0.1 mg/dL (ref 0.0–0.3)
Total Bilirubin: 0.7 mg/dL (ref 0.2–1.2)
Total Protein: 6.8 g/dL (ref 6.0–8.3)

## 2014-07-22 LAB — CBC WITH DIFFERENTIAL/PLATELET
Basophils Absolute: 0 10*3/uL (ref 0.0–0.1)
Basophils Relative: 0.3 % (ref 0.0–3.0)
Eosinophils Absolute: 0.1 10*3/uL (ref 0.0–0.7)
Eosinophils Relative: 0.9 % (ref 0.0–5.0)
HCT: 48.2 % (ref 39.0–52.0)
Hemoglobin: 16.2 g/dL (ref 13.0–17.0)
Lymphocytes Relative: 20.7 % (ref 12.0–46.0)
Lymphs Abs: 2.3 10*3/uL (ref 0.7–4.0)
MCHC: 33.5 g/dL (ref 30.0–36.0)
MCV: 91 fl (ref 78.0–100.0)
Monocytes Absolute: 0.7 10*3/uL (ref 0.1–1.0)
Monocytes Relative: 6 % (ref 3.0–12.0)
Neutro Abs: 7.9 10*3/uL — ABNORMAL HIGH (ref 1.4–7.7)
Neutrophils Relative %: 72.1 % (ref 43.0–77.0)
Platelets: 259 10*3/uL (ref 150.0–400.0)
RBC: 5.29 Mil/uL (ref 4.22–5.81)
RDW: 14.8 % (ref 11.5–15.5)
WBC: 11 10*3/uL — ABNORMAL HIGH (ref 4.0–10.5)

## 2014-07-22 LAB — BASIC METABOLIC PANEL
BUN: 10 mg/dL (ref 6–23)
CO2: 30 mEq/L (ref 19–32)
Calcium: 9.7 mg/dL (ref 8.4–10.5)
Chloride: 104 mEq/L (ref 96–112)
Creatinine, Ser: 0.9 mg/dL (ref 0.40–1.50)
GFR: 93.94 mL/min (ref >60.00)
Glucose, Bld: 105 mg/dL — ABNORMAL HIGH (ref 70–99)
Potassium: 4.5 mEq/L (ref 3.5–5.1)
Sodium: 140 mEq/L (ref 135–145)

## 2014-07-22 LAB — LIPID PANEL
Cholesterol: 211 mg/dL — ABNORMAL HIGH (ref 0–200)
HDL: 45.8 mg/dL (ref >39.00)
LDL Cholesterol: 129 mg/dL — ABNORMAL HIGH (ref 0–99)
NonHDL: 165.2
Total CHOL/HDL Ratio: 5
Triglycerides: 183 mg/dL — ABNORMAL HIGH (ref 0.0–149.0)
VLDL: 36.6 mg/dL (ref 0.0–40.0)

## 2014-07-22 LAB — PSA: PSA: 0.46 ng/mL (ref 0.10–4.00)

## 2014-07-22 LAB — TSH: TSH: 1.56 u[IU]/mL (ref 0.35–4.50)

## 2014-07-22 MED ORDER — AZELASTINE-FLUTICASONE 137-50 MCG/ACT NA SUSP: NASAL | Status: DC

## 2014-07-22 NOTE — Telephone Encounter (Signed)
emmi mailed  °

## 2014-07-22 NOTE — Progress Notes (Signed)
Pre visit review using our clinic review tool, if applicable. No additional management support is needed unless otherwise documented below in the visit note. 

## 2014-07-22 NOTE — Progress Notes (Signed)
Patient ID: [name], male    DOB: [birth year]  Age: 54 y.o. MRN: [id number]    Subjective:  Subjective HPI [name] presents for cpe and c/o sinus congestion that is 24/7. He sees Dr [name] for his back pain.    Review of Systems  Constitutional: Negative.   HENT: Positive for congestion and sinus pressure. Negative for ear pain, hearing loss, nosebleeds, postnasal drip, rhinorrhea, sneezing and tinnitus.   Eyes: Negative for photophobia, discharge, itching and visual disturbance.  Respiratory: Negative.   Cardiovascular: Negative.   Gastrointestinal: Negative for abdominal pain, constipation, blood in stool, abdominal distention and anal bleeding.  Endocrine: Negative.   Genitourinary: Negative.   Musculoskeletal: Positive for back pain.  Skin: Negative.   Allergic/Immunologic: Negative.   Neurological: Negative for dizziness, weakness, light-headedness, numbness and headaches.  Psychiatric/Behavioral: Negative for suicidal ideas, confusion, sleep disturbance, dysphoric mood, decreased concentration and agitation. The patient is not nervous/anxious.     History Past Medical History  Diagnosis Date  . Arthritis     Back  . Colon polyps   . GERD (gastroesophageal reflux disease)     every now and then  . Lumbar surgical wound fluid collection     lumbar wound dehiscence    He has past surgical history that includes Eye surgery; Shoulder surgery; Neck surgery; Spine surgery; Tonsillectomy; Maximum access (mas)posterior lumbar interbody fusion (plif) 1 level (N/A, 02/14/2014); Colonoscopy w/ biopsies and polypectomy; and Lumbar wound debridement (N/A, 03/21/2014).   His family history includes Aneurysm in his maternal grandfather; Aneurysm (age of onset: 26) in his maternal aunt; Cancer (age of onset: 44) in his paternal uncle; Cancer (age of onset: 8) in his father; Cancer (age of onset: 68) in his paternal uncle; Cancer (age of onset: 69) in his paternal  grandfather; Colon polyps in his brother; Depression (age of onset: 64) in his brother; Heart disease in his maternal aunt; Hypertension in his mother.He reports that he has been smoking Cigarettes.  He has a 15 pack-year smoking history. His smokeless tobacco use includes Snuff. He reports that he drinks about 14.4 oz of alcohol per week. He reports that he does not use illicit drugs.  No current outpatient prescriptions on file prior to visit.   No current facility-administered medications on file prior to visit.     Objective:  Objective Physical Exam  Constitutional: He is oriented to person, place, and time. He appears well-developed and well-nourished. No distress.  HENT:  Head: Normocephalic and atraumatic.  Right Ear: Tympanic membrane, external ear and ear canal normal.  Left Ear: Tympanic membrane, external ear and ear canal normal.  Nose: Right sinus exhibits maxillary sinus tenderness and frontal sinus tenderness. Left sinus exhibits no maxillary sinus tenderness and no frontal sinus tenderness.  Mouth/Throat: Oropharynx is clear and moist. No oropharyngeal exudate.  Eyes: Conjunctivae and EOM are normal. Pupils are equal, round, and reactive to light. Right eye exhibits no discharge. Left eye exhibits no discharge.  Neck: Normal range of motion. Neck supple. No JVD present. No thyromegaly present.  Cardiovascular: Normal rate, regular rhythm and intact distal pulses.  Exam reveals no gallop and no friction rub.   No murmur heard. Pulmonary/Chest: Effort normal and breath sounds normal. No respiratory distress. He has no wheezes. He has no rales. He exhibits no tenderness.  Abdominal: Soft. Bowel sounds are normal. He exhibits no distension and no mass. There is no tenderness. There is no rebound and no guarding.  Genitourinary: Rectum normal,  prostate normal and penis normal. Guaiac negative stool.  Musculoskeletal: Normal range of motion. He exhibits no edema or tenderness.    Lymphadenopathy:    He has no cervical adenopathy.  Neurological: He is alert and oriented to person, place, and time. He displays normal reflexes. He exhibits normal muscle tone.  Skin: Skin is warm and dry. No rash noted. He is not diaphoretic. No erythema. No pallor.  Psychiatric: He has a normal mood and affect. His behavior is normal. Judgment and thought content normal.   BP 110/78 mmHg  Pulse 93  Temp(Src) 98.2 F (36.8 C) (Oral)  Ht 5' 11.42" (1.814 m)  Wt 256 lb 12.8 oz (116.484 kg)  BMI 35.40 kg/m2  SpO2 97% Wt Readings from Last 3 Encounters:  07/22/14 256 lb 12.8 oz (116.484 kg)  03/21/14 249 lb 1.6 oz (112.991 kg)  03/20/14 249 lb 1.6 oz (112.991 kg)     Lab Results  Component Value Date   WBC 9.8 03/20/2014   HGB 14.6 03/20/2014   HCT 44.3 03/20/2014   PLT 238 03/20/2014   GLUCOSE 98 03/20/2014   CHOL 205* 07/12/2013   TRIG 54.0 07/12/2013   HDL 57.30 07/12/2013   LDLDIRECT 150.1 03/07/2012   LDLCALC 137* 07/12/2013   ALT 20 07/12/2013   AST 16 07/12/2013   NA 140 03/20/2014   K 5.5* 03/20/2014   CL 102 03/20/2014   CREATININE 0.78 03/20/2014   BUN 11 03/20/2014   CO2 24 03/20/2014   TSH 1.74 07/12/2013   PSA 0.62 07/12/2013   INR 0.95 02/14/2014    No results found.   Assessment & Plan:  Plan I have discontinued Mr. [name]'s methocarbamol, oxyCODONE-acetaminophen, methocarbamol, and doxycycline. I am also having him start on Azelastine-Fluticasone. Additionally, I am having him maintain his Oxycodone HCl and meloxicam.  Meds ordered this encounter  Medications  . Oxycodone HCl 10 MG TABS    Sig: Take 10 mg by mouth every 6 (six) hours as needed.    Refill:  0  . meloxicam (MOBIC) 15 MG tablet    Sig: Take 15 mg by mouth daily. with food    Refill:  2  . Azelastine-Fluticasone 137-50 MCG/ACT SUSP    Sig: 1 spray each nostril bid    Dispense:  1 Bottle    Refill:  5    Problem List Items Addressed This Visit    Preventative health  care - Primary   Relevant Orders   Basic metabolic panel   CBC with Differential/Platelet   Hepatic function panel   Lipid panel   POCT urinalysis dipstick   TSH   PSA    1. Preventative health care  - Basic metabolic panel - CBC with Differential/Platelet - Hepatic function panel - Lipid panel - POCT urinalysis dipstick - TSH - PSA   Follow-up: Return in about 1 year (around 07/22/2015), or if symptoms worsen or fail to improve.  Garnet Koyanagi, DO

## 2014-07-22 NOTE — Patient Instructions (Signed)
Preventive Care for Adults A healthy lifestyle and preventive care can promote health and wellness. Preventive health guidelines for men include the following key practices:  A routine yearly physical is a good way to check with your health care provider about your health and preventative screening. It is a chance to share any concerns and updates on your health and to receive a thorough exam.  Visit your dentist for a routine exam and preventative care every 6 months. Brush your teeth twice a day and floss once a day. Good oral hygiene prevents tooth decay and gum disease.  The frequency of eye exams is based on your age, health, family medical history, use of contact lenses, and other factors. Follow your health care provider's recommendations for frequency of eye exams.  Eat a healthy diet. Foods such as vegetables, fruits, whole grains, low-fat dairy products, and lean protein foods contain the nutrients you need without too many calories. Decrease your intake of foods high in solid fats, added sugars, and salt. Eat the right amount of calories for you.Get information about a proper diet from your health care provider, if necessary.  Regular physical exercise is one of the most important things you can do for your health. Most adults should get at least 150 minutes of moderate-intensity exercise (any activity that increases your heart rate and causes you to sweat) each week. In addition, most adults need muscle-strengthening exercises on 2 or more days a week.  Maintain a healthy weight. The body mass index (BMI) is a screening tool to identify possible weight problems. It provides an estimate of body fat based on height and weight. Your health care provider can find your BMI and can help you achieve or maintain a healthy weight.For adults 20 years and older:  A BMI below 18.5 is considered underweight.  A BMI of 18.5 to 24.9 is normal.  A BMI of 25 to 29.9 is considered overweight.  A BMI  of 30 and above is considered obese.  Maintain normal blood lipids and cholesterol levels by exercising and minimizing your intake of saturated fat. Eat a balanced diet with plenty of fruit and vegetables. Blood tests for lipids and cholesterol should begin at age 50 and be repeated every 5 years. If your lipid or cholesterol levels are high, you are over 50, or you are at high risk for heart disease, you may need your cholesterol levels checked more frequently.Ongoing high lipid and cholesterol levels should be treated with medicines if diet and exercise are not working.  If you smoke, find out from your health care provider how to quit. If you do not use tobacco, do not start.  Lung cancer screening is recommended for adults aged 73-80 years who are at high risk for developing lung cancer because of a history of smoking. A yearly low-dose CT scan of the lungs is recommended for people who have at least a 30-pack-year history of smoking and are a current smoker or have quit within the past 15 years. A pack year of smoking is smoking an average of 1 pack of cigarettes a day for 1 year (for example: 1 pack a day for 30 years or 2 packs a day for 15 years). Yearly screening should continue until the smoker has stopped smoking for at least 15 years. Yearly screening should be stopped for people who develop a health problem that would prevent them from having lung cancer treatment.  If you choose to drink alcohol, do not have more than  2 drinks per day. One drink is considered to be 12 ounces (355 mL) of beer, 5 ounces (148 mL) of wine, or 1.5 ounces (44 mL) of liquor.  Avoid use of street drugs. Do not share needles with anyone. Ask for help if you need support or instructions about stopping the use of drugs.  High blood pressure causes heart disease and increases the risk of stroke. Your blood pressure should be checked at least every 1-2 years. Ongoing high blood pressure should be treated with  medicines, if weight loss and exercise are not effective.  If you are 45-79 years old, ask your health care provider if you should take aspirin to prevent heart disease.  Diabetes screening involves taking a blood sample to check your fasting blood sugar level. This should be done once every 3 years, after age 45, if you are within normal weight and without risk factors for diabetes. Testing should be considered at a younger age or be carried out more frequently if you are overweight and have at least 1 risk factor for diabetes.  Colorectal cancer can be detected and often prevented. Most routine colorectal cancer screening begins at the age of 50 and continues through age 75. However, your health care provider may recommend screening at an earlier age if you have risk factors for colon cancer. On a yearly basis, your health care provider may provide home test kits to check for hidden blood in the stool. Use of a small camera at the end of a tube to directly examine the colon (sigmoidoscopy or colonoscopy) can detect the earliest forms of colorectal cancer. Talk to your health care provider about this at age 50, when routine screening begins. Direct exam of the colon should be repeated every 5-10 years through age 75, unless early forms of precancerous polyps or small growths are found.  People who are at an increased risk for hepatitis B should be screened for this virus. You are considered at high risk for hepatitis B if:  You were born in a country where hepatitis B occurs often. Talk with your health care provider about which countries are considered high risk.  Your parents were born in a high-risk country and you have not received a shot to protect against hepatitis B (hepatitis B vaccine).  You have HIV or AIDS.  You use needles to inject street drugs.  You live with, or have sex with, someone who has hepatitis B.  You are a man who has sex with other men (MSM).  You get hemodialysis  treatment.  You take certain medicines for conditions such as cancer, organ transplantation, and autoimmune conditions.  Hepatitis C blood testing is recommended for all people born from 1945 through 1965 and any individual with known risks for hepatitis C.  Practice safe sex. Use condoms and avoid high-risk sexual practices to reduce the spread of sexually transmitted infections (STIs). STIs include gonorrhea, chlamydia, syphilis, trichomonas, herpes, HPV, and human immunodeficiency virus (HIV). Herpes, HIV, and HPV are viral illnesses that have no cure. They can result in disability, cancer, and death.  If you are at risk of being infected with HIV, it is recommended that you take a prescription medicine daily to prevent HIV infection. This is called preexposure prophylaxis (PrEP). You are considered at risk if:  You are a man who has sex with other men (MSM) and have other risk factors.  You are a heterosexual man, are sexually active, and are at increased risk for HIV infection.    You take drugs by injection.  You are sexually active with a partner who has HIV.  Talk with your health care provider about whether you are at high risk of being infected with HIV. If you choose to begin PrEP, you should first be tested for HIV. You should then be tested every 3 months for as long as you are taking PrEP.  A one-time screening for abdominal aortic aneurysm (AAA) and surgical repair of large AAAs by ultrasound are recommended for men ages 32 to 67 years who are current or former smokers.  Healthy men should no longer receive prostate-specific antigen (PSA) blood tests as part of routine cancer screening. Talk with your health care provider about prostate cancer screening.  Testicular cancer screening is not recommended for adult males who have no symptoms. Screening includes self-exam, a health care provider exam, and other screening tests. Consult with your health care provider about any symptoms  you have or any concerns you have about testicular cancer.  Use sunscreen. Apply sunscreen liberally and repeatedly throughout the day. You should seek shade when your shadow is shorter than you. Protect yourself by wearing long sleeves, pants, a wide-brimmed hat, and sunglasses year round, whenever you are outdoors.  Once a month, do a whole-body skin exam, using a mirror to look at the skin on your back. Tell your health care provider about new moles, moles that have irregular borders, moles that are larger than a pencil eraser, or moles that have changed in shape or color.  Stay current with required vaccines (immunizations).  Influenza vaccine. All adults should be immunized every year.  Tetanus, diphtheria, and acellular pertussis (Td, Tdap) vaccine. An adult who has not previously received Tdap or who does not know his vaccine status should receive 1 dose of Tdap. This initial dose should be followed by tetanus and diphtheria toxoids (Td) booster doses every 10 years. Adults with an unknown or incomplete history of completing a 3-dose immunization series with Td-containing vaccines should begin or complete a primary immunization series including a Tdap dose. Adults should receive a Td booster every 10 years.  Varicella vaccine. An adult without evidence of immunity to varicella should receive 2 doses or a second dose if he has previously received 1 dose.  Human papillomavirus (HPV) vaccine. Males aged 68-21 years who have not received the vaccine previously should receive the 3-dose series. Males aged 22-26 years may be immunized. Immunization is recommended through the age of 6 years for any male who has sex with males and did not get any or all doses earlier. Immunization is recommended for any person with an immunocompromised condition through the age of 49 years if he did not get any or all doses earlier. During the 3-dose series, the second dose should be obtained 4-8 weeks after the first  dose. The third dose should be obtained 24 weeks after the first dose and 16 weeks after the second dose.  Zoster vaccine. One dose is recommended for adults aged 50 years or older unless certain conditions are present.  Measles, mumps, and rubella (MMR) vaccine. Adults born before [birth year] generally are considered immune to measles and mumps. Adults born in [birth year] or later should have 1 or more doses of MMR vaccine unless there is a contraindication to the vaccine or there is laboratory evidence of immunity to each of the three diseases. A routine second dose of MMR vaccine should be obtained at least 28 days after the first dose for students attending postsecondary  schools, health care workers, or international travelers. People who received inactivated measles vaccine or an unknown type of measles vaccine during 1963-1967 should receive 2 doses of MMR vaccine. People who received inactivated mumps vaccine or an unknown type of mumps vaccine before 1979 and are at high risk for mumps infection should consider immunization with 2 doses of MMR vaccine. Unvaccinated health care workers born before 1957 who lack laboratory evidence of measles, mumps, or rubella immunity or laboratory confirmation of disease should consider measles and mumps immunization with 2 doses of MMR vaccine or rubella immunization with 1 dose of MMR vaccine.  Pneumococcal 13-valent conjugate (PCV13) vaccine. When indicated, a person who is uncertain of his immunization history and has no record of immunization should receive the PCV13 vaccine. An adult aged 19 years or older who has certain medical conditions and has not been previously immunized should receive 1 dose of PCV13 vaccine. This PCV13 should be followed with a dose of pneumococcal polysaccharide (PPSV23) vaccine. The PPSV23 vaccine dose should be obtained at least 8 weeks after the dose of PCV13 vaccine. An adult aged 19 years or older who has certain medical conditions and  previously received 1 or more doses of PPSV23 vaccine should receive 1 dose of PCV13. The PCV13 vaccine dose should be obtained 1 or more years after the last PPSV23 vaccine dose.  Pneumococcal polysaccharide (PPSV23) vaccine. When PCV13 is also indicated, PCV13 should be obtained first. All adults aged 65 years and older should be immunized. An adult younger than age 65 years who has certain medical conditions should be immunized. Any person who resides in a nursing home or long-term care facility should be immunized. An adult smoker should be immunized. People with an immunocompromised condition and certain other conditions should receive both PCV13 and PPSV23 vaccines. People with human immunodeficiency virus (HIV) infection should be immunized as soon as possible after diagnosis. Immunization during chemotherapy or radiation therapy should be avoided. Routine use of PPSV23 vaccine is not recommended for American Indians, [place] Natives, or people younger than 65 years unless there are medical conditions that require PPSV23 vaccine. When indicated, people who have unknown immunization and have no record of immunization should receive PPSV23 vaccine. One-time revaccination 5 years after the first dose of PPSV23 is recommended for people aged 19-64 years who have chronic kidney failure, nephrotic syndrome, asplenia, or immunocompromised conditions. People who received 1-2 doses of PPSV23 before age 65 years should receive another dose of PPSV23 vaccine at age 65 years or later if at least 5 years have passed since the previous dose. Doses of PPSV23 are not needed for people immunized with PPSV23 at or after age 65 years.  Meningococcal vaccine. Adults with asplenia or persistent complement component deficiencies should receive 2 doses of quadrivalent meningococcal conjugate (MenACWY-D) vaccine. The doses should be obtained at least 2 months apart. Microbiologists working with certain meningococcal bacteria,  military recruits, people at risk during an outbreak, and people who travel to or live in countries with a high rate of meningitis should be immunized. A first-year college student up through age 21 years who is living in a residence hall should receive a dose if he did not receive a dose on or after his 16th birthday. Adults who have certain high-risk conditions should receive one or more doses of vaccine.  Hepatitis A vaccine. Adults who wish to be protected from this disease, have certain high-risk conditions, work with hepatitis A-infected animals, work in hepatitis A research labs, or   travel to or work in countries with a high rate of hepatitis A should be immunized. Adults who were previously unvaccinated and who anticipate close contact with an international adoptee during the first 60 days after arrival in the [place] from a country with a high rate of hepatitis A should be immunized.  Hepatitis B vaccine. Adults should be immunized if they wish to be protected from this disease, have certain high-risk conditions, may be exposed to blood or other infectious body fluids, are household contacts or sex partners of hepatitis B positive people, are clients or workers in certain care facilities, or travel to or work in countries with a high rate of hepatitis B.  Haemophilus influenzae type b (Hib) vaccine. A previously unvaccinated person with asplenia or sickle cell disease or having a scheduled splenectomy should receive 1 dose of Hib vaccine. Regardless of previous immunization, a recipient of a hematopoietic stem cell transplant should receive a 3-dose series 6-12 months after his successful transplant. Hib vaccine is not recommended for adults with HIV infection. Preventive Service / Frequency Ages 52 to 17  Blood pressure check.** / Every 1 to 2 years.  Lipid and cholesterol check.** / Every 5 years beginning at age 69.  Hepatitis C blood test.** / For any individual with known risks for  hepatitis C.  Skin self-exam. / Monthly.  Influenza vaccine. / Every year.  Tetanus, diphtheria, and acellular pertussis (Tdap, Td) vaccine.** / Consult your health care provider. 1 dose of Td every 10 years.  Varicella vaccine.** / Consult your health care provider.  HPV vaccine. / 3 doses over 6 months, if 72 or younger.  Measles, mumps, rubella (MMR) vaccine.** / You need at least 1 dose of MMR if you were born in 1957 or later. You may also need a second dose.  Pneumococcal 13-valent conjugate (PCV13) vaccine.** / Consult your health care provider.  Pneumococcal polysaccharide (PPSV23) vaccine.** / 1 to 2 doses if you smoke cigarettes or if you have certain conditions.  Meningococcal vaccine.** / 1 dose if you are age 35 to 60 years and a Market researcher living in a residence hall, or have one of several medical conditions. You may also need additional booster doses.  Hepatitis A vaccine.** / Consult your health care provider.  Hepatitis B vaccine.** / Consult your health care provider.  Haemophilus influenzae type b (Hib) vaccine.** / Consult your health care provider. Ages 35 to 8  Blood pressure check.** / Every 1 to 2 years.  Lipid and cholesterol check.** / Every 5 years beginning at age 57.  Lung cancer screening. / Every year if you are aged 44-80 years and have a 30-pack-year history of smoking and currently smoke or have quit within the past 15 years. Yearly screening is stopped once you have quit smoking for at least 15 years or develop a health problem that would prevent you from having lung cancer treatment.  Fecal occult blood test (FOBT) of stool. / Every year beginning at age 55 and continuing until age 73. You may not have to do this test if you get a colonoscopy every 10 years.  Flexible sigmoidoscopy** or colonoscopy.** / Every 5 years for a flexible sigmoidoscopy or every 10 years for a colonoscopy beginning at age 28 and continuing until age  1.  Hepatitis C blood test.** / For all people born from [birth year] through 1965 and any individual with known risks for hepatitis C.  Skin self-exam. / Monthly.  Influenza vaccine. / Every  year.  Tetanus, diphtheria, and acellular pertussis (Tdap/Td) vaccine.** / Consult your health care provider. 1 dose of Td every 10 years.  Varicella vaccine.** / Consult your health care provider.  Zoster vaccine.** / 1 dose for adults aged 17 years or older.  Measles, mumps, rubella (MMR) vaccine.** / You need at least 1 dose of MMR if you were born in 1957 or later. You may also need a second dose.  Pneumococcal 13-valent conjugate (PCV13) vaccine.** / Consult your health care provider.  Pneumococcal polysaccharide (PPSV23) vaccine.** / 1 to 2 doses if you smoke cigarettes or if you have certain conditions.  Meningococcal vaccine.** / Consult your health care provider.  Hepatitis A vaccine.** / Consult your health care provider.  Hepatitis B vaccine.** / Consult your health care provider.  Haemophilus influenzae type b (Hib) vaccine.** / Consult your health care provider. Ages 45 and over  Blood pressure check.** / Every 1 to 2 years.  Lipid and cholesterol check.**/ Every 5 years beginning at age 48.  Lung cancer screening. / Every year if you are aged 80-80 years and have a 30-pack-year history of smoking and currently smoke or have quit within the past 15 years. Yearly screening is stopped once you have quit smoking for at least 15 years or develop a health problem that would prevent you from having lung cancer treatment.  Fecal occult blood test (FOBT) of stool. / Every year beginning at age 75 and continuing until age 50. You may not have to do this test if you get a colonoscopy every 10 years.  Flexible sigmoidoscopy** or colonoscopy.** / Every 5 years for a flexible sigmoidoscopy or every 10 years for a colonoscopy beginning at age 78 and continuing until age 45.  Hepatitis C blood  test.** / For all people born from [birth year] through 1965 and any individual with known risks for hepatitis C.  Abdominal aortic aneurysm (AAA) screening.** / A one-time screening for ages 44 to 59 years who are current or former smokers.  Skin self-exam. / Monthly.  Influenza vaccine. / Every year.  Tetanus, diphtheria, and acellular pertussis (Tdap/Td) vaccine.** / 1 dose of Td every 10 years.  Varicella vaccine.** / Consult your health care provider.  Zoster vaccine.** / 1 dose for adults aged 31 years or older.  Pneumococcal 13-valent conjugate (PCV13) vaccine.** / Consult your health care provider.  Pneumococcal polysaccharide (PPSV23) vaccine.** / 1 dose for all adults aged 4 years and older.  Meningococcal vaccine.** / Consult your health care provider.  Hepatitis A vaccine.** / Consult your health care provider.  Hepatitis B vaccine.** / Consult your health care provider.  Haemophilus influenzae type b (Hib) vaccine.** / Consult your health care provider. **Family history and personal history of risk and conditions may change your health care provider's recommendations. Document Released: 06/15/2001 Document Revised: 04/24/2013 Document Reviewed: 09/14/2010 [facility] Patient Information 2015 [place], [place]. This information is not intended to replace advice given to you by your health care provider. Make sure you discuss any questions you have with your health care provider.

## 2014-11-07 ENCOUNTER — Encounter: Payer: Self-pay | Admitting: Gastroenterology

## 2014-11-12 ENCOUNTER — Telehealth: Payer: Self-pay

## 2014-11-12 ENCOUNTER — Ambulatory Visit (HOSPITAL_BASED_OUTPATIENT_CLINIC_OR_DEPARTMENT_OTHER)
Admission: RE | Admit: 2014-11-12 | Discharge: 2014-11-12 | Disposition: A | Discharge status: Home / Self Care | Payer: 59 | Source: Ambulatory Visit | Attending: Family Medicine | Admitting: Family Medicine

## 2014-11-12 ENCOUNTER — Ambulatory Visit (INDEPENDENT_AMBULATORY_CARE_PROVIDER_SITE_OTHER): Payer: 59 | Admitting: Family Medicine

## 2014-11-12 ENCOUNTER — Encounter: Payer: Self-pay | Admitting: Family Medicine

## 2014-11-12 VITALS — BP 114/80 | HR 70 | Temp 99.4°F | Ht 71.0 in | Wt 254.6 lb

## 2014-11-12 DIAGNOSIS — J209 Acute bronchitis, unspecified: Secondary | ICD-10-CM

## 2014-11-12 DIAGNOSIS — R05 Cough: Secondary | ICD-10-CM | POA: Insufficient documentation

## 2014-11-12 DIAGNOSIS — Z87891 Personal history of nicotine dependence: Secondary | ICD-10-CM | POA: Diagnosis not present

## 2014-11-12 DIAGNOSIS — R0602 Shortness of breath: Secondary | ICD-10-CM | POA: Diagnosis not present

## 2014-11-12 DIAGNOSIS — F17210 Nicotine dependence, cigarettes, uncomplicated: Secondary | ICD-10-CM

## 2014-11-12 MED ORDER — METHYLPREDNISOLONE ACETATE 80 MG/ML IJ SUSP
80.0000 mg | Freq: Once | INTRAMUSCULAR | Status: AC
  Administered 2014-11-12: 80 mg via INTRAMUSCULAR

## 2014-11-12 MED ORDER — PREDNISONE 10 MG PO TABS: ORAL_TABLET | ORAL | Status: DC

## 2014-11-12 MED ORDER — ALBUTEROL SULFATE 108 (90 BASE) MCG/ACT IN AEPB: 2.0000 | INHALATION_SPRAY | Freq: Four times a day (QID) | RESPIRATORY_TRACT | Status: DC | PRN

## 2014-11-12 MED ORDER — AMOXICILLIN-POT CLAVULANATE 875-125 MG PO TABS: 1.0000 | ORAL_TABLET | Freq: Two times a day (BID) | ORAL | Status: DC

## 2014-11-12 MED ORDER — IPRATROPIUM-ALBUTEROL 0.5-2.5 (3) MG/3ML IN SOLN
3.0000 mL | Freq: Once | RESPIRATORY_TRACT | Status: AC
  Administered 2014-11-12: 3 mL via RESPIRATORY_TRACT

## 2014-11-12 NOTE — Telephone Encounter (Signed)
-----   Message from Rosalita Chessman, DO sent at 11/12/2014 12:57 PM EDT ----- Regarding: FW: Lung Cancer Screening  Please let pt know he does not qualify because he is not 54yo--- I can try to schedule a baseline now and see if his insurance will pay for it.   Otherwise we can enroll him when he is 54yo.    ----- Message -----    From: Magdalen Spatz, NP    Sent: 11/12/2014  10:49 AM      To: Rosalita Chessman, DO Subject: Lung Cancer Screening                          Dr. Etter Sjogren,  Unfortunately, Mr. Hilleary does not qualify for the Lung Cancer Screening Program due to his age. ( He is too young) Patients have to be at least 53 years old per the guidelines. I absolutely hate to turn anyone away. Please order the scan if you feel you want a baseline for him, and the program can pick him up when he is 56.   Thanks so much for the referral. I am so sorry we cannot pick Mr. Whelpley up until 2018.  Eric Form

## 2014-11-12 NOTE — Progress Notes (Signed)
Pre visit review using our clinic review tool, if applicable. No additional management support is needed unless otherwise documented below in the visit note. 

## 2014-11-12 NOTE — Patient Instructions (Addendum)
Acute Bronchitis Bronchitis is inflammation of the airways that extend from the windpipe into the lungs (bronchi). The inflammation often causes mucus to develop. This leads to a cough, which is the most common symptom of bronchitis.  In acute bronchitis, the condition usually develops suddenly and goes away over time, usually in a couple weeks. Smoking, allergies, and asthma can make bronchitis worse. Repeated episodes of bronchitis may cause further lung problems.  CAUSES Acute bronchitis is most often caused by the same virus that causes a cold. The virus can spread from person to person (contagious) through coughing, sneezing, and touching contaminated objects. SIGNS AND SYMPTOMS   Cough.   Fever.   Coughing up mucus.   Body aches.   Chest congestion.   Chills.   Shortness of breath.   Sore throat.  DIAGNOSIS  Acute bronchitis is usually diagnosed through a physical exam. Your health care provider will also ask you questions about your medical history. Tests, such as chest X-rays, are sometimes done to rule out other conditions.  TREATMENT  Acute bronchitis usually goes away in a couple weeks. Oftentimes, no medical treatment is necessary. Medicines are sometimes given for relief of fever or cough. Antibiotic medicines are usually not needed but may be prescribed in certain situations. In some cases, an inhaler may be recommended to help reduce shortness of breath and control the cough. A cool mist vaporizer may also be used to help thin bronchial secretions and make it easier to clear the chest.  HOME CARE INSTRUCTIONS  Get plenty of rest.   Drink enough fluids to keep your urine clear or pale yellow (unless you have a medical condition that requires fluid restriction). Increasing fluids may help thin your respiratory secretions (sputum) and reduce chest congestion, and it will prevent dehydration.   Take medicines only as directed by your health care provider.  If  you were prescribed an antibiotic medicine, finish it all even if you start to feel better.  Avoid smoking and secondhand smoke. Exposure to cigarette smoke or irritating chemicals will make bronchitis worse. If you are a smoker, consider using nicotine gum or skin patches to help control withdrawal symptoms. Quitting smoking will help your lungs heal faster.   Reduce the chances of another bout of acute bronchitis by washing your hands frequently, avoiding people with cold symptoms, and trying not to touch your hands to your mouth, nose, or eyes.   Keep all follow-up visits as directed by your health care provider.  SEEK MEDICAL CARE IF: Your symptoms do not improve after 1 week of treatment.  SEEK IMMEDIATE MEDICAL CARE IF:  You develop an increased fever or chills.   You have chest pain.   You have severe shortness of breath.  You have bloody sputum.   You develop dehydration.  You faint or repeatedly feel like you are going to pass out.  You develop repeated vomiting.  You develop a severe headache. MAKE SURE YOU:   Understand these instructions.  Will watch your condition.  Will get help right away if you are not doing well or get worse. Document Released: 05/27/2004 Document Revised: 09/03/2013 Document Reviewed: 10/10/2012 [facility] Patient Information 2015 [place], [place]. This information is not intended to replace advice given to you by your health care provider. Make sure you discuss any questions you have with your health care provider. Nicotine Addiction Nicotine can act as both a stimulant (excites/activates) and a sedative (calms/quiets). Immediately after exposure to nicotine, there is a "kick" caused  in part by the drug's stimulation of the adrenal glands and resulting discharge of adrenaline (epinephrine). The rush of adrenaline stimulates the body and causes a sudden release of sugar. This means that smokers are always slightly hyperglycemic. Hyperglycemic  means that the blood sugar is high, just like in diabetics. Nicotine also decreases the amount of insulin which helps control sugar levels in the body. There is an increase in blood pressure, breathing, and the rate of heart beats.  In addition, nicotine indirectly causes a release of dopamine in the brain that controls pleasure and motivation. A similar reaction is seen with other drugs of abuse, such as cocaine and heroin. This dopamine release is thought to cause the pleasurable sensations when smoking. In some different cases, nicotine can also create a calming effect, depending on sensitivity of the smoker's nervous system and the dose of nicotine taken. WHAT HAPPENS WHEN NICOTINE IS TAKEN FOR LONG PERIODS OF TIME?  Long-term use of nicotine results in addiction. It is difficult to stop.  Repeated use of nicotine creates tolerance. Higher doses of nicotine are needed to get the "kick." When nicotine use is stopped, withdrawal may last a month or more. Withdrawal may begin within a few hours after the last cigarette. Symptoms peak within the first few days and may lessen within a few weeks. For some people, however, symptoms may last for months or longer. Withdrawal symptoms include:   Irritability.  Craving.  Learning and attention deficits.  Sleep disturbances.  Increased appetite. Craving for tobacco may last for 6 months or longer. Many behaviors done while using nicotine can also play a part in the severity of withdrawal symptoms. For some people, the feel, smell, and sight of a cigarette and the ritual of obtaining, handling, lighting, and smoking the cigarette are closely linked with the pleasure of smoking. When stopped, they also miss the related behaviors which make the withdrawal or craving worse. While nicotine gum and patches may lessen the drug aspects of withdrawal, cravings often persist. WHAT ARE THE MEDICAL CONSEQUENCES OF NICOTINE USE?  Nicotine addiction accounts for  one-third of all cancers. The top cancer caused by tobacco is lung cancer. Lung cancer is the number one cancer killer of both men and women.  Smoking is also associated with cancers of the:  Mouth.  Pharynx.  Larynx.  Esophagus.  Stomach.  Pancreas.  Cervix.  Kidney.  Ureter.  Bladder.  Smoking also causes lung diseases such as lasting (chronic) bronchitis and emphysema.  It worsens asthma in adults and children.  Smoking increases the risk of heart disease, including:  Stroke.  Heart attack.  Vascular disease.  Aneurysm.  Passive or secondary smoke can also increase medical risks including:  Asthma in children.  Sudden Infant Death Syndrome (SIDS).  Additionally, dropped cigarettes are the leading cause of residential fire fatalities.  Nicotine poisoning has been reported from accidental ingestion of tobacco products by children and pets. Death usually results in a few minutes from respiratory failure (when a person stops breathing) caused by paralysis. TREATMENT   Medication. Nicotine replacement medicines such as nicotine gum and the patch are used to stop smoking. These medicines gradually lower the dosage of nicotine in the body. These medicines do not contain the carbon monoxide and other toxins found in tobacco smoke.  Hypnotherapy.  Relaxation therapy.  Nicotine Anonymous (a 12-step support program). Find times and locations in your local yellow pages. Document Released: 12/24/2003 Document Revised: 07/12/2011 Document Reviewed: 06/15/2013 [employer] Patient Information 2015  [employer]. This information is not intended to replace advice given to you by your health care provider. Make sure you discuss any questions you have with your health care provider.

## 2014-11-12 NOTE — Progress Notes (Signed)
  Subjective:     [name] is a 54 y.o. male who presents for evaluation of symptoms of a URI. Symptoms include congestion, cough described as productive, low grade fever, nasal congestion, productive cough with  green colored sputum, purulent nasal discharge and wheezing. Onset of symptoms was 7 days ago, and has been gradually worsening since that time. Treatment to date: none.  The following portions of the patient's history were reviewed and updated as appropriate:  He  has a past medical history of Arthritis; Colon polyps; GERD (gastroesophageal reflux disease); and Lumbar surgical wound fluid collection. He  does not have any pertinent problems on file. He  has past surgical history that includes Eye surgery; Shoulder surgery; Neck surgery; Spine surgery; Tonsillectomy; Maximum access (mas)posterior lumbar interbody fusion (plif) 1 level (N/A, 02/14/2014); Colonoscopy w/ biopsies and polypectomy; and Lumbar wound debridement (N/A, 03/21/2014). His family history includes Aneurysm in his maternal grandfather; Aneurysm (age of onset: 30) in his maternal aunt; Cancer (age of onset: 81) in his paternal uncle; Cancer (age of onset: 70) in his father; Cancer (age of onset: 82) in his paternal uncle; Cancer (age of onset: 53) in his paternal grandfather; Colon polyps in his brother; Depression (age of onset: 67) in his brother; Heart disease in his maternal aunt; Hypertension in his mother. He  reports that he has been smoking Cigarettes.  He has a 15 pack-year smoking history. His smokeless tobacco use includes Snuff. He reports that he drinks about 14.4 oz of alcohol per week. He reports that he does not use illicit drugs. He has a current medication list which includes the following prescription(s): azelastine-fluticasone, duloxetine, meloxicam, oxycodone hcl, and tizanidine. Current Outpatient Prescriptions on File Prior to Visit  Medication Sig Dispense Refill  . Azelastine-Fluticasone  137-50 MCG/ACT SUSP 1 spray each nostril bid 1 Bottle 5  . meloxicam (MOBIC) 15 MG tablet Take 15 mg by mouth daily. with food  2  . Oxycodone HCl 10 MG TABS Take 10 mg by mouth every 6 (six) hours as needed.  0   No current facility-administered medications on file prior to visit.   He is allergic to cefuroxime axetil..  Review of Systems Pertinent items are noted in HPI.   Objective:    BP 114/80 mmHg  Pulse 70  Temp(Src) 99.4 F (37.4 C) (Oral)  Ht 5\' 11"  (1.803 m)  Wt 254 lb 9.6 oz (115.486 kg)  BMI 35.53 kg/m2  SpO2 96% General appearance: alert, cooperative, appears stated age and no distress Ears: normal TM's and external ear canals both ears Nose: green discharge, moderate congestion, no sinus tenderness Throat: lips, mucosa, and tongue normal; teeth and gums normal Neck: no adenopathy, supple, symmetrical, trachea midline and thyroid not enlarged, symmetric, no tenderness/mass/nodules Lungs: wheezes bilaterally--duoneb given with little improvement of Breath sounds Heart: S1, S2 normal Extremities: extremities normal, atraumatic, no cyanosis or edema   Assessment:    bronchitis   Plan:    Suggested symptomatic OTC remedies. Nasal saline spray for congestion. Augmentin per orders. Follow up as needed. proair and pred taper   Depo medrol 80 IM given in office Check cxr

## 2014-11-12 NOTE — Telephone Encounter (Signed)
Spoke with patient and he said he is will wait until he is 31 and call us back if he changed his mind.      KP

## 2014-12-04 ENCOUNTER — Encounter: Payer: Self-pay | Admitting: Internal Medicine

## 2014-12-04 ENCOUNTER — Ambulatory Visit (INDEPENDENT_AMBULATORY_CARE_PROVIDER_SITE_OTHER): Payer: 59 | Admitting: Internal Medicine

## 2014-12-04 ENCOUNTER — Other Ambulatory Visit: Payer: Self-pay | Admitting: Neurological Surgery

## 2014-12-04 VITALS — BP 116/74 | HR 74 | Temp 98.3°F | Ht 71.0 in | Wt 258.4 lb

## 2014-12-04 DIAGNOSIS — M545 Low back pain, unspecified: Secondary | ICD-10-CM

## 2014-12-04 DIAGNOSIS — J209 Acute bronchitis, unspecified: Secondary | ICD-10-CM | POA: Diagnosis not present

## 2014-12-04 DIAGNOSIS — G8929 Other chronic pain: Secondary | ICD-10-CM

## 2014-12-04 MED ORDER — BUDESONIDE-FORMOTEROL FUMARATE 160-4.5 MCG/ACT IN AERO: 2.0000 | INHALATION_SPRAY | Freq: Two times a day (BID) | RESPIRATORY_TRACT | Status: DC

## 2014-12-04 MED ORDER — AZITHROMYCIN 250 MG PO TABS: ORAL_TABLET | ORAL | Status: DC

## 2014-12-04 MED ORDER — PREDNISONE 10 MG PO TABS: ORAL_TABLET | ORAL | Status: DC

## 2014-12-04 NOTE — Progress Notes (Signed)
Pre visit review using our clinic review tool, if applicable. No additional management support is needed unless otherwise documented below in the visit note. 

## 2014-12-04 NOTE — Progress Notes (Signed)
Subjective:    Patient ID: [name], male    DOB: [birth year], 54 y.o.   MRN: [id number]  DOS:  12/04/2014 Type of visit - description : Acute visit Interval history:  Was seen last month by PCP with severe sinus, chest congestion. Cough and wheezing. Was prescribed a steroid, Augmentin and inhaler. Better but not completely well, symptoms have decreased but he still wheezes from time to time , has cough and has some sinus congestion. He is still smoking. No history of COPD or asthma.   Review of Systems No fever but occasional chills and sweats. No chest pain, mild shortness of breath. + sputum production, dark/yellow. No hemoptysis  Past Medical History  Diagnosis Date  . Arthritis     Back  . Colon polyps   . GERD (gastroesophageal reflux disease)     every now and then  . Lumbar surgical wound fluid collection     lumbar wound dehiscence    Past Surgical History  Procedure Laterality Date  . Eye surgery    . Shoulder surgery    . Neck surgery    . Spine surgery    . Tonsillectomy    . Maximum access (mas)posterior lumbar interbody fusion (plif) 1 level N/A 02/14/2014    Procedure: FOR MAXIMUM ACCESS SURGERY POSTERIOR LUMBAR INTERBODY FUSION LUMBAR TWO-THREE;  Surgeon: Eustace Moore, MD;  Location: [facility];  Service: Neurosurgery;  Laterality: N/A;  . Colonoscopy w/ biopsies and polypectomy    . Lumbar wound debridement N/A 03/21/2014    Procedure: LUMBAR WOUND DEBRIDEMENT;  Surgeon: Eustace Moore, MD;  Location: [facility];  Service: Neurosurgery;  Laterality: N/A;    History   Social History  . Marital Status: Married    Spouse Name: N/A  . Number of Children: N/A  . Years of Education: N/A   Occupational History  . produce [employer]   Social History Main Topics  . Smoking status: Current Every Day Smoker -- 1.00 packs/day for 44 years    Types: Cigarettes  . Smokeless tobacco: Current User    Types: Snuff  . Alcohol Use:  14.4 oz/week    24 Standard drinks or equivalent per week     Comment: social beer  . Drug Use: No  . Sexual Activity:    Partners: Female   Other Topics Concern  . Not on file   Social History Narrative   Exercise--no        Medication List       This list is accurate as of: 12/04/14 11:59 PM.  Always use your most recent med list.               Albuterol Sulfate 108 (90 BASE) MCG/ACT Aepb  Commonly known as:  PROAIR RESPICLICK  Inhale 2 puffs into the lungs 4 (four) times daily as needed.     azithromycin 250 MG tablet  Commonly known as:  ZITHROMAX Z-PAK  2 tabs a day the first day, then 1 tab a day x 4 days     budesonide-formoterol 160-4.5 MCG/ACT inhaler  Commonly known as:  SYMBICORT  Inhale 2 puffs into the lungs 2 (two) times daily.     DULoxetine 30 MG capsule  Commonly known as:  CYMBALTA  Take 1 capsule by mouth daily.     meloxicam 15 MG tablet  Commonly known as:  MOBIC  Take 15 mg by mouth daily. with food     Oxycodone  HCl 10 MG Tabs  Take 10 mg by mouth every 6 (six) hours as needed.     predniSONE 10 MG tablet  Commonly known as:  DELTASONE  4 tablets x 2 days, 3 tabs x 2 days, 2 tabs x 2 days, 1 tab x 2 days     tiZANidine 4 MG capsule  Commonly known as:  ZANAFLEX  Take 1 capsule by mouth 3 (three) times daily as needed.           Objective:   Physical Exam BP 116/74 mmHg  Pulse 74  Temp(Src) 98.3 F (36.8 C) (Oral)  Ht 5\' 11"  (1.803 m)  Wt 258 lb 6 oz (117.198 kg)  BMI 36.05 kg/m2  SpO2 97% General:   Well developed, well nourished . NAD.  HEENT:  Normocephalic . Face  atraumatic, left eyelid slightly droopy (chronic per patient). Sinuses slightly TTP at the left maxillary sinus. Tympanic membranes normal. Throat is slightly red without discharge. Nose congested. Lungs:  Few rhonchi and end expiratory wheezing. Normal respiratory effort, no intercostal retractions, no accessory muscle use. Heart: RRR,  no murmur.  No  pretibial edema bilaterally  Skin: Not pale. Not jaundice Neurologic:  alert & oriented X3.  Speech normal, gait appropriate for age and unassisted Psych--  Cognition and judgment appear intact.  Cooperative with normal attention span and concentration.  Behavior appropriate. No anxious or depressed appearing.      Assessment & Plan:   Bronchitis, Recently seen with bronchitis and bronchospasm. Better after treatment but not completely well.  also is slightly tender at the maxillary area. Plan: Second round of prednisone, second round of antibiotics with Zithromax, Symbicort for at least a month. See instructions.

## 2014-12-04 NOTE — Patient Instructions (Signed)
Rest, fluids , tylenol  For cough: Take Mucinex DM twice a day as needed until better  For nasal congestion Use OTC Nasocort or Flonase : 2 nasal sprays on each side of the nose daily until you feel better  Prednisone for a few more days  Follow-up wheezing: Symbicort twice a day for one month, also you can take albuterol as needed  Take the antibiotic as prescribed  (Zithromax)  Call if not gradually better over the next  10 days  Call anytime if the symptoms are severe

## 2014-12-11 ENCOUNTER — Encounter: Payer: Self-pay | Admitting: Gastroenterology

## 2014-12-20 ENCOUNTER — Ambulatory Visit
Admission: RE | Admit: 2014-12-20 | Discharge: 2014-12-20 | Disposition: A | Discharge status: Home / Self Care | Payer: 59 | Source: Ambulatory Visit | Attending: Neurological Surgery | Admitting: Neurological Surgery

## 2014-12-20 VITALS — BP 107/69 | HR 80

## 2014-12-20 DIAGNOSIS — M545 Low back pain, unspecified: Secondary | ICD-10-CM

## 2014-12-20 DIAGNOSIS — G8929 Other chronic pain: Secondary | ICD-10-CM

## 2014-12-20 DIAGNOSIS — Z981 Arthrodesis status: Secondary | ICD-10-CM

## 2014-12-20 MED ORDER — ONDANSETRON HCL 4 MG/2ML IJ SOLN
4.0000 mg | Freq: Once | INTRAMUSCULAR | Status: AC
  Administered 2014-12-20: 4 mg via INTRAMUSCULAR

## 2014-12-20 MED ORDER — MEPERIDINE HCL 100 MG/ML IJ SOLN
75.0000 mg | Freq: Once | INTRAMUSCULAR | Status: AC
  Administered 2014-12-20: 75 mg via INTRAMUSCULAR

## 2014-12-20 MED ORDER — IOHEXOL 180 MG/ML  SOLN
15.0000 mL | Freq: Once | INTRAMUSCULAR | Status: DC | PRN
  Administered 2014-12-20: 15 mL via INTRATHECAL

## 2014-12-20 MED ORDER — DIAZEPAM 5 MG PO TABS
10.0000 mg | ORAL_TABLET | Freq: Once | ORAL | Status: AC
  Administered 2014-12-20: 10 mg via ORAL

## 2014-12-20 NOTE — Discharge Instructions (Signed)
Myelogram Discharge Instructions  1. Go home and rest quietly for the next 24 hours.  It is important to lie flat for the next 24 hours.  Get up only to go to the restroom.  You may lie in the bed or on a couch on your back, your stomach, your left side or your right side.  You may have one pillow under your head.  You may have pillows between your knees while you are on your side or under your knees while you are on your back.  2. DO NOT drive today.  Recline the seat as far back as it will go, while still wearing your seat belt, on the way home.  3. You may get up to go to the bathroom as needed.  You may sit up for 10 minutes to eat.  You may resume your normal diet and medications unless otherwise indicated.  Drink lots of extra fluids today and tomorrow.  4. The incidence of headache, nausea, or vomiting is about 5% (one in 20 patients).  If you develop a headache, lie flat and drink plenty of fluids until the headache goes away.  Caffeinated beverages may be helpful.  If you develop severe nausea and vomiting or a headache that does not go away with flat bed rest, call [phone].  5. You may resume normal activities after your 24 hours of bed rest is over; however, do not exert yourself strongly or do any heavy lifting tomorrow. If when you get up you have a headache when standing, go back to bed and force fluids for another 24 hours.  6. Call your physician for a follow-up appointment.  The results of your myelogram will be sent directly to your physician by the following day.  7. If you have any questions or if complications develop after you arrive home, please call [phone].  Discharge instructions have been explained to the patient.  The patient, or the person responsible for the patient, fully understands these instructions.       May resume Cymbalta on Aug. 20, 2016, after 9:30 am.

## 2014-12-20 NOTE — Progress Notes (Signed)
Pt states he has been off Cymbalta for the past 2 days. Discharge instructions explained to pt.

## 2015-02-06 DIAGNOSIS — M25519 Pain in unspecified shoulder: Secondary | ICD-10-CM | POA: Insufficient documentation

## 2015-05-02 ENCOUNTER — Ambulatory Visit (INDEPENDENT_AMBULATORY_CARE_PROVIDER_SITE_OTHER): Payer: 59 | Admitting: Medical

## 2015-05-02 ENCOUNTER — Encounter: Payer: Self-pay | Admitting: Medical

## 2015-05-02 VITALS — BP 110/70 | HR 71 | Temp 98.2°F | Ht 71.0 in | Wt 255.0 lb

## 2015-05-02 DIAGNOSIS — J309 Allergic rhinitis, unspecified: Secondary | ICD-10-CM | POA: Diagnosis not present

## 2015-05-02 DIAGNOSIS — H9209 Otalgia, unspecified ear: Secondary | ICD-10-CM | POA: Diagnosis not present

## 2015-05-02 MED ORDER — FLUTICASONE PROPIONATE 50 MCG/ACT NA SUSP: 2.0000 | Freq: Every day | NASAL | Status: DC

## 2015-05-02 MED ORDER — AZITHROMYCIN 250 MG PO TABS: ORAL_TABLET | ORAL | Status: DC

## 2015-05-02 NOTE — Progress Notes (Signed)
Subjective:    Patient ID: [name], male    DOB: 11/17/1960, 54 y.o.   MRN: [id number]  HPI  Pt in with some sensation of left ear discomfort. Pt states that if he changes his position that his ear feels like it will open up. Early on he would hold nose and increase in ears to get brief relief.   Pt states he feels like has chronic nasal congestion. Some presently. Pt states he sneezes a lot. Sometime 15 times in a row 2 times a week.  When he lifts his head up he feels like has some crackles in his ear.   Review of Systems  Constitutional: Negative for chills, diaphoresis and fatigue.  HENT: Positive for congestion, ear pain and postnasal drip. Negative for nosebleeds, sinus pressure, sneezing and sore throat.   Respiratory: Negative for cough, chest tightness, shortness of breath and wheezing.   Musculoskeletal: Negative for back pain.  Neurological: Positive for headaches. Negative for dizziness.  Hematological: Negative for adenopathy. Does not bruise/bleed easily.  Psychiatric/Behavioral: Negative for behavioral problems and confusion.    Past Medical History  Diagnosis Date  . Arthritis     Back  . Colon polyps   . GERD (gastroesophageal reflux disease)     every now and then  . Lumbar surgical wound fluid collection     lumbar wound dehiscence    Social History   Social History  . Marital Status: Married    Spouse Name: N/A  . Number of Children: N/A  . Years of Education: N/A   Occupational History  . produce [employer]   Social History Main Topics  . Smoking status: Current Every Day Smoker -- 1.00 packs/day for 44 years    Types: Cigarettes  . Smokeless tobacco: Current User    Types: Snuff  . Alcohol Use: 14.4 oz/week    24 Standard drinks or equivalent per week     Comment: social beer  . Drug Use: No  . Sexual Activity:    Partners: Female   Other Topics Concern  . Not on file   Social History Narrative   Exercise--no      Past Surgical History  Procedure Laterality Date  . Eye surgery    . Shoulder surgery    . Neck surgery    . Spine surgery    . Tonsillectomy    . Maximum access (mas)posterior lumbar interbody fusion (plif) 1 level N/A 02/14/2014    Procedure: FOR MAXIMUM ACCESS SURGERY POSTERIOR LUMBAR INTERBODY FUSION LUMBAR TWO-THREE;  Surgeon: Eustace Moore, MD;  Location: [facility];  Service: Neurosurgery;  Laterality: N/A;  . Colonoscopy w/ biopsies and polypectomy    . Lumbar wound debridement N/A 03/21/2014    Procedure: LUMBAR WOUND DEBRIDEMENT;  Surgeon: Eustace Moore, MD;  Location: [facility];  Service: Neurosurgery;  Laterality: N/A;    Family History  Problem Relation Age of Onset  . Cancer Father 42    colon   . Hypertension Mother   . Colon polyps Brother   . Aneurysm Maternal Aunt 58    brain  . Heart disease Maternal Aunt     d 8  . Cancer Paternal Uncle 23    stomach  . Aneurysm Maternal Grandfather     stomach  . Cancer Paternal Grandfather 46    stomach  . Depression Brother 20    suicide  . Cancer Paternal Uncle 42    stomach  Allergies  Allergen Reactions  . Cefuroxime Axetil     REACTION: hives    Current Outpatient Prescriptions on File Prior to Visit  Medication Sig Dispense Refill  . budesonide-formoterol (SYMBICORT) 160-4.5 MCG/ACT inhaler Inhale 2 puffs into the lungs 2 (two) times daily. 1 Inhaler 3  . DULoxetine (CYMBALTA) 30 MG capsule Take 1 capsule by mouth daily.  2  . meloxicam (MOBIC) 15 MG tablet Take 15 mg by mouth daily. with food  2  . Oxycodone HCl 10 MG TABS Take 10 mg by mouth every 6 (six) hours as needed.  0  . tiZANidine (ZANAFLEX) 4 MG capsule Take 1 capsule by mouth 3 (three) times daily as needed.  2   No current facility-administered medications on file prior to visit.    BP 110/70 mmHg  Pulse 71  Temp(Src) 98.2 F (36.8 C) (Oral)  Ht 5\' 11"  (1.803 m)  Wt 255 lb (115.667 kg)  BMI 35.58 kg/m2  SpO2 96%        Objective:   Physical Exam  General  Mental Status - Alert. General Appearance - Well groomed. Not in acute distress.  Skin Rashes- No Rashes.  HEENT Head- Normal. Ear Auditory Canal - Left- Normal. Right - Normal.Tympanic Membrane- Left- moderate red streak central portion of tm. Right- Normal. Eye Sclera/Conjunctiva- Left- Normal. Right- Normal. Nose & Sinuses Nasal Mucosa- Left-  Boggy and Congested. Right-  Boggy and  Congested.Bilateral no maxillary but no   frontal sinus pressure. Mouth & Throat Lips: Upper Lip- Normal: no dryness, cracking, pallor, cyanosis, or vesicular eruption. Lower Lip-Normal: no dryness, cracking, pallor, cyanosis or vesicular eruption. Buccal Mucosa- Bilateral- No Aphthous ulcers. Oropharynx- No Discharge or Erythema. +pnd Tonsils: Characteristics- Bilateral- No Erythema or Congestion. Size/Enlargement- Bilateral- No enlargement. Discharge- bilateral-None.  Neck Neck- Supple. No Masses.   Chest and Lung Exam Auscultation: Breath Sounds:-Clear even and unlabored.  Cardiovascular Auscultation:Rythm- Regular, rate and rhythm. Murmurs & Other Heart Sounds:Ausculatation of the heart reveal- No Murmurs.  Lymphatic Head & Neck General Head & Neck Lymphatics: Bilateral: Description- No Localized lymphadenopathy.       Assessment & Plan:  You may have some chronic allergies with eustachian tube dysfunction.  I will rx flonase nasal spray. This may reduce sinus inflammation and reduce pressure in the eustachian tube.  For your ear discomfort and current redness will rx azirhromycin.  Follow up in 7-10 days or as needed.  If symptoms persist consider referring to ENT

## 2015-05-02 NOTE — Progress Notes (Signed)
Pre visit review using our clinic review tool, if applicable. No additional management support is needed unless otherwise documented below in the visit note. 

## 2015-05-02 NOTE — Patient Instructions (Signed)
You may have some chronic allergies with eustachian tube dysfunction.  I will rx flonase nasal spray. This may reduce sinus inflammation and reduce pressure in the eustachian tube.  For your ear discomfort and current redness will rx azirhromycin.  Follow up in 7-10 days or as needed.  If symptoms persist consider referring to ENT.

## 2015-05-12 ENCOUNTER — Ambulatory Visit: Payer: 59 | Admitting: Medical

## 2015-05-12 ENCOUNTER — Telehealth: Payer: Self-pay | Admitting: *Deleted

## 2015-05-12 NOTE — Telephone Encounter (Signed)
Received paperwork for Medical Records for Social Security Claim/Appeal; forwarded to [place] for email/scan to Med Records/SLS 01/09

## 2015-05-30 ENCOUNTER — Telehealth: Payer: Self-pay | Admitting: Family Medicine

## 2015-05-30 NOTE — Telephone Encounter (Signed)
Called pt to ask about the flu vac. He says that he has had it but is unsure of the location and the date.

## 2015-06-05 DIAGNOSIS — M533 Sacrococcygeal disorders, not elsewhere classified: Secondary | ICD-10-CM | POA: Insufficient documentation

## 2015-07-07 ENCOUNTER — Telehealth: Payer: Self-pay | Admitting: Family Medicine

## 2015-07-07 NOTE — Telephone Encounter (Signed)
LM for pt to call and schedule flu shot or update records & Last cpe 07/22/14

## 2015-08-28 ENCOUNTER — Ambulatory Visit (INDEPENDENT_AMBULATORY_CARE_PROVIDER_SITE_OTHER): Payer: BLUE CROSS/BLUE SHIELD | Admitting: Internal Medicine

## 2015-08-28 ENCOUNTER — Encounter: Payer: Self-pay | Admitting: Internal Medicine

## 2015-08-28 VITALS — BP 140/80 | HR 91 | Temp 98.5°F | Resp 20 | Wt 259.0 lb

## 2015-08-28 DIAGNOSIS — M25562 Pain in left knee: Secondary | ICD-10-CM | POA: Diagnosis not present

## 2015-08-28 DIAGNOSIS — T07XXXA Unspecified multiple injuries, initial encounter: Secondary | ICD-10-CM

## 2015-08-28 DIAGNOSIS — T148 Other injury of unspecified body region: Secondary | ICD-10-CM

## 2015-08-28 DIAGNOSIS — E785 Hyperlipidemia, unspecified: Secondary | ICD-10-CM | POA: Diagnosis not present

## 2015-08-28 DIAGNOSIS — N320 Bladder-neck obstruction: Secondary | ICD-10-CM | POA: Diagnosis not present

## 2015-08-28 DIAGNOSIS — Z981 Arthrodesis status: Secondary | ICD-10-CM

## 2015-08-28 NOTE — Progress Notes (Signed)
Pre visit review using our clinic review tool, if applicable. No additional management support is needed unless otherwise documented below in the visit note. 

## 2015-08-28 NOTE — Patient Instructions (Addendum)
Please continue all other medications as before, and refills have been done if requested.  Please have the pharmacy call with any other refills you may need.  Please keep your appointments with your specialists as you may have planned  You will be contacted regarding the referral for: Dr Tamala Julian (sports medicine in this office)  Please go to the LAB in the Basement (turn left off the elevator) for the tests to be done at your convenience, at your request  You will be contacted by phone if any changes need to be made immediately.  Otherwise, you will receive a letter about your results with an explanation, but please check with MyChart first.  Please remember to sign up for MyChart if you have not done so, as this will be important to you in the future with finding out test results, communicating by private email, and scheduling acute appointments online when needed.

## 2015-08-28 NOTE — Progress Notes (Signed)
Subjective:    Patient ID: [name], male    DOB: 1960/07/26, 55 y.o.   MRN: [id number]  HPI  Here with 3 wks onset left knee pain and swelling after a fall in the yard when LLE buckled possibly due to weakness related to ongoing lower back disc dz, now s/p lumbar surgury x 2.  Did not feel he needed medical attn immediately, but pain and swelling has persisted, especially to the left lateral knee, worse to ambulate, better to sit, constant, no fever, other trauma or falls, no hx of gout.  No prior hx of knee meniscal dz.  Pt denies chest pain, increased sob or doe, wheezing, orthopnea, PND, increased LE swelling, palpitations, dizziness or syncope.  Pt denies new neurological symptoms such as new headache, or facial or extremity weakness or numbness though has ongoing lower back pain - Pt continues to have recurring LBP without change in severity, no bowel or bladder change, fever, wt loss,  worsening LE pain/numbness/weakness, gait change or falls.  Mentions drive to Dr Etter Sjogren 30 miles too far now since PCP has changed office.  Also today with c/o unusual bruising to the arms, recent onset, seems only some light touch to a hard surface causes bruise, then later a second trauma will cause skin tear. Past Medical History  Diagnosis Date  . Arthritis     Back  . Colon polyps   . GERD (gastroesophageal reflux disease)     every now and then  . Lumbar surgical wound fluid collection     lumbar wound dehiscence   Past Surgical History  Procedure Laterality Date  . Eye surgery    . Shoulder surgery    . Neck surgery    . Spine surgery    . Tonsillectomy    . Maximum access (mas)posterior lumbar interbody fusion (plif) 1 level N/A 02/14/2014    Procedure: FOR MAXIMUM ACCESS SURGERY POSTERIOR LUMBAR INTERBODY FUSION LUMBAR TWO-THREE;  Surgeon: Eustace Moore, MD;  Location: [place] [facility];  Service: Neurosurgery;  Laterality: N/A;  . Colonoscopy w/ biopsies and polypectomy    . Lumbar wound  debridement N/A 03/21/2014    Procedure: LUMBAR WOUND DEBRIDEMENT;  Surgeon: Eustace Moore, MD;  Location: [place];  Service: Neurosurgery;  Laterality: N/A;    reports that he has been smoking Cigarettes.  He has a 44 pack-year smoking history. His smokeless tobacco use includes Snuff. He reports that he drinks about 14.4 oz of alcohol per week. He reports that he does not use illicit drugs. family history includes Aneurysm in his maternal grandfather; Aneurysm (age of onset: 95) in his maternal aunt; Cancer (age of onset: 23) in his paternal uncle; Cancer (age of onset: 24) in his father; Cancer (age of onset: 64) in his paternal uncle; Cancer (age of onset: 67) in his paternal grandfather; Colon polyps in his brother; Depression (age of onset: 49) in his brother; Heart disease in his maternal aunt; Hypertension in his mother. Allergies  Allergen Reactions  . Cefuroxime Axetil     REACTION: hives   Current Outpatient Prescriptions on File Prior to Visit  Medication Sig Dispense Refill  . budesonide-formoterol (SYMBICORT) 160-4.5 MCG/ACT inhaler Inhale 2 puffs into the lungs 2 (two) times daily. 1 Inhaler 3  . DULoxetine (CYMBALTA) 30 MG capsule Take 1 capsule by mouth daily.  2  . fluticasone (FLONASE) 50 MCG/ACT nasal spray Place 2 sprays into both nostrils daily. 16 g 1  . meloxicam (MOBIC)  15 MG tablet Take 15 mg by mouth daily. with food  2  . Oxycodone HCl 10 MG TABS Take 10 mg by mouth every 6 (six) hours as needed.  0  . tiZANidine (ZANAFLEX) 4 MG capsule Take 1 capsule by mouth 3 (three) times daily as needed.  2   No current facility-administered medications on file prior to visit.    Review of Systems  Constitutional: Negative for unusual diaphoresis or night sweats HENT: Negative for ear swelling or discharge Eyes: Negative for worsening visual haziness  Respiratory: Negative for choking and stridor.   Gastrointestinal: Negative for distension or worsening  eructation Genitourinary: Negative for retention or change in urine volume.  Musculoskeletal: Negative for other MSK pain or swelling Skin: Negative for color change and worsening wound Neurological: Negative for tremors and numbness other than noted  Psychiatric/Behavioral: Negative for decreased concentration or agitation other than above       Objective:   Physical Exam BP 140/80 mmHg  Pulse 91  Temp(Src) 98.5 F (36.9 C) (Oral)  Resp 20  Wt 259 lb (117.482 kg)  SpO2 96% VS noted,  Constitutional: Pt appears in no apparent distress HENT: Head: NCAT.  Right Ear: External ear normal.  Left Ear: External ear normal.  Eyes: . Pupils are equal, round, and reactive to light. Conjunctivae and EOM are normal Neck: Normal range of motion. Neck supple.  Cardiovascular: Normal rate and regular rhythm.   Pulmonary/Chest: Effort normal and breath sounds without rales or wheezing.  Abd:  Soft, NT, ND, + BS Neurological: Pt is alert. Not confused , motor grossly intact Skin: Skin is warm. No rash, no LE edema, but mult bruising noted to both post distal arms Psychiatric: Pt behavior is normal. No agitation.  Left knee: 1-2+ effusion, decreased ROM, tender lateral joint line    Assessment & Plan:

## 2015-08-29 ENCOUNTER — Other Ambulatory Visit (INDEPENDENT_AMBULATORY_CARE_PROVIDER_SITE_OTHER): Payer: BLUE CROSS/BLUE SHIELD

## 2015-08-29 ENCOUNTER — Encounter: Payer: Self-pay | Admitting: Internal Medicine

## 2015-08-29 ENCOUNTER — Other Ambulatory Visit: Payer: Self-pay | Admitting: Internal Medicine

## 2015-08-29 ENCOUNTER — Telehealth: Payer: Self-pay | Admitting: Family Medicine

## 2015-08-29 DIAGNOSIS — T07XXXA Unspecified multiple injuries, initial encounter: Secondary | ICD-10-CM

## 2015-08-29 DIAGNOSIS — N320 Bladder-neck obstruction: Secondary | ICD-10-CM | POA: Diagnosis not present

## 2015-08-29 DIAGNOSIS — T148 Other injury of unspecified body region: Secondary | ICD-10-CM

## 2015-08-29 DIAGNOSIS — E785 Hyperlipidemia, unspecified: Secondary | ICD-10-CM | POA: Diagnosis not present

## 2015-08-29 LAB — CBC WITH DIFFERENTIAL/PLATELET
Basophils Absolute: 0.1 10*3/uL (ref 0.0–0.1)
Basophils Relative: 0.8 % (ref 0.0–3.0)
Eosinophils Absolute: 0.2 10*3/uL (ref 0.0–0.7)
Eosinophils Relative: 2.2 % (ref 0.0–5.0)
HCT: 45.4 % (ref 39.0–52.0)
Hemoglobin: 15.6 g/dL (ref 13.0–17.0)
Lymphocytes Relative: 21.1 % (ref 12.0–46.0)
Lymphs Abs: 2.1 10*3/uL (ref 0.7–4.0)
MCHC: 34.4 g/dL (ref 30.0–36.0)
MCV: 91.7 fl (ref 78.0–100.0)
Monocytes Absolute: 0.9 10*3/uL (ref 0.1–1.0)
Monocytes Relative: 8.7 % (ref 3.0–12.0)
Neutro Abs: 6.8 10*3/uL (ref 1.4–7.7)
Neutrophils Relative %: 67.2 % (ref 43.0–77.0)
Platelets: 308 10*3/uL (ref 150.0–400.0)
RBC: 4.95 Mil/uL (ref 4.22–5.81)
RDW: 14.7 % (ref 11.5–15.5)
WBC: 10.1 10*3/uL (ref 4.0–10.5)

## 2015-08-29 LAB — LIPID PANEL
Cholesterol: 199 mg/dL (ref 0–200)
HDL: 43.7 mg/dL (ref >39.00)
LDL Cholesterol: 133 mg/dL — ABNORMAL HIGH (ref 0–99)
NonHDL: 155.4
Total CHOL/HDL Ratio: 5
Triglycerides: 113 mg/dL (ref 0.0–149.0)
VLDL: 22.6 mg/dL (ref 0.0–40.0)

## 2015-08-29 LAB — BASIC METABOLIC PANEL
BUN: 14 mg/dL (ref 6–23)
CO2: 28 mEq/L (ref 19–32)
Calcium: 9.9 mg/dL (ref 8.4–10.5)
Chloride: 104 mEq/L (ref 96–112)
Creatinine, Ser: 0.88 mg/dL (ref 0.40–1.50)
GFR: 96.01 mL/min (ref >60.00)
Glucose, Bld: 121 mg/dL — ABNORMAL HIGH (ref 70–99)
Potassium: 5.5 mEq/L — ABNORMAL HIGH (ref 3.5–5.1)
Sodium: 138 mEq/L (ref 135–145)

## 2015-08-29 LAB — PROTIME-INR
INR: 1 ratio (ref 0.8–1.0)
Prothrombin Time: 10 s (ref 9.6–13.1)

## 2015-08-29 LAB — TSH: TSH: 1.82 u[IU]/mL (ref 0.35–4.50)

## 2015-08-29 LAB — HEPATIC FUNCTION PANEL
ALT: 28 U/L (ref 0–53)
AST: 13 U/L (ref 0–37)
Albumin: 4.3 g/dL (ref 3.5–5.2)
Alkaline Phosphatase: 71 U/L (ref 39–117)
Bilirubin, Direct: 0.2 mg/dL (ref 0.0–0.3)
Total Bilirubin: 0.9 mg/dL (ref 0.2–1.2)
Total Protein: 6.4 g/dL (ref 6.0–8.3)

## 2015-08-29 LAB — PSA: PSA: 0.45 ng/mL (ref 0.10–4.00)

## 2015-08-29 MED ORDER — ROSUVASTATIN CALCIUM 10 MG PO TABS: 10.0000 mg | ORAL_TABLET | Freq: Every day | ORAL | Status: DC

## 2015-08-29 NOTE — Assessment & Plan Note (Signed)
Etiology unclear, but cant r/o meniscal tear, for pain control, refer Sports medicine for u/s eval,  to f/u any worsening symptoms or concerns

## 2015-08-29 NOTE — Assessment & Plan Note (Addendum)
No signficant neuro change today, pain chronic persistent, o/w stable overall by history and exam,  and pt to continue medical treatment as before,  to f/u any worsening symptoms or concerns

## 2015-08-29 NOTE — Assessment & Plan Note (Addendum)
Recent onset, etiology unclear, for cbc and INR in addition to other labs requested per pt,  to f/u any worsening symptoms or concerns

## 2015-08-29 NOTE — Telephone Encounter (Signed)
Pt spouse Caren Griffins called in to switch PCP to Dr. [name] at [facility]. She says that the location is more convenient for them.

## 2015-08-29 NOTE — Telephone Encounter (Signed)
Ok with me, but must be ok with Dr Etter Sjogren as well

## 2015-08-31 NOTE — Telephone Encounter (Signed)
Fine with me

## 2015-09-01 ENCOUNTER — Encounter: Payer: Self-pay | Admitting: Family Medicine

## 2015-09-02 NOTE — Telephone Encounter (Signed)
Got scheduled  °

## 2015-09-09 ENCOUNTER — Other Ambulatory Visit: Payer: Self-pay

## 2015-09-09 ENCOUNTER — Ambulatory Visit (INDEPENDENT_AMBULATORY_CARE_PROVIDER_SITE_OTHER): Payer: BLUE CROSS/BLUE SHIELD | Admitting: Family Medicine

## 2015-09-09 ENCOUNTER — Ambulatory Visit (INDEPENDENT_AMBULATORY_CARE_PROVIDER_SITE_OTHER)
Admission: RE | Admit: 2015-09-09 | Discharge: 2015-09-09 | Disposition: A | Discharge status: Home / Self Care | Payer: BLUE CROSS/BLUE SHIELD | Source: Ambulatory Visit | Attending: Family Medicine | Admitting: Family Medicine

## 2015-09-09 ENCOUNTER — Telehealth: Payer: Self-pay

## 2015-09-09 ENCOUNTER — Encounter: Payer: Self-pay | Admitting: Family Medicine

## 2015-09-09 ENCOUNTER — Other Ambulatory Visit: Payer: Self-pay | Admitting: Family Medicine

## 2015-09-09 VITALS — BP 120/82 | HR 86 | Ht 72.0 in | Wt 257.0 lb

## 2015-09-09 DIAGNOSIS — M25562 Pain in left knee: Secondary | ICD-10-CM

## 2015-09-09 DIAGNOSIS — S83249A Other tear of medial meniscus, current injury, unspecified knee, initial encounter: Secondary | ICD-10-CM | POA: Insufficient documentation

## 2015-09-09 DIAGNOSIS — S83242A Other tear of medial meniscus, current injury, left knee, initial encounter: Secondary | ICD-10-CM

## 2015-09-09 DIAGNOSIS — E669 Obesity, unspecified: Secondary | ICD-10-CM

## 2015-09-09 MED ORDER — DICLOFENAC SODIUM 2 % TD SOLN: 2.0000 "application " | Freq: Two times a day (BID) | TRANSDERMAL | Status: DC

## 2015-09-09 MED ORDER — ROSUVASTATIN CALCIUM 10 MG PO TABS: 10.0000 mg | ORAL_TABLET | Freq: Every day | ORAL | Status: DC

## 2015-09-09 NOTE — Assessment & Plan Note (Signed)
Encourage weight loss. 

## 2015-09-09 NOTE — Progress Notes (Signed)
Pre visit review using our clinic review tool, if applicable. No additional management support is needed unless otherwise documented below in the visit note. 

## 2015-09-09 NOTE — Telephone Encounter (Signed)
Medication sent to new pharmacy

## 2015-09-09 NOTE — Patient Instructions (Signed)
Good to see you.  Edsel Petrin a meniscal tear Xray downstairs today on your way out.  Ice 20 minutes 2 times daily. Usually after activity and before bed. Exercises 3 times a week.  pennsaid pinkie amount topically 2 times daily as needed.  Avoid deep squats or twisting motion.  Vitamin D 2000 IU daily  See me again in 3-4 weeks to make sure you are doing better.

## 2015-09-09 NOTE — Progress Notes (Signed)
[name] [address], [place] [address] Phone: [phone] Subjective:    I'm seeing this patient by the request  of:  Ann Held, DO Dr. [name]  CC: right knee pain  [id number] [name] is a 55 y.o. male coming in with complaint of right knee pain. Patient states it isn't going on for approximate 4 months. We discussed that patient may have injured it with a twisting motion. Now states that if he makes a movement to facet feels like his knee is going to give out. Some mild swelling. No radiation of pain. Has been dealing with lumbar back issues significant amount of time and did just have another epidural. Epidurals do not seem to be helpful. Patient though because she he has been careful with his back has to do some other squatting which she is unable to do secondary to this knee. Rates the severity pain is 8 out of 10. No nighttime awakening. Patient states though that came feels stiff in the morning.     Past Medical History  Diagnosis Date  . Arthritis     Back  . Colon polyps   . GERD (gastroesophageal reflux disease)     every now and then  . Lumbar surgical wound fluid collection     lumbar wound dehiscence   Past Surgical History  Procedure Laterality Date  . Eye surgery    . Shoulder surgery    . Neck surgery    . Spine surgery    . Tonsillectomy    . Maximum access (mas)posterior lumbar interbody fusion (plif) 1 level N/A 02/14/2014    Procedure: FOR MAXIMUM ACCESS SURGERY POSTERIOR LUMBAR INTERBODY FUSION LUMBAR TWO-THREE;  Surgeon: Eustace Moore, MD;  Location: [facility];  Service: Neurosurgery;  Laterality: N/A;  . Colonoscopy w/ biopsies and polypectomy    . Lumbar wound debridement N/A 03/21/2014    Procedure: LUMBAR WOUND DEBRIDEMENT;  Surgeon: Eustace Moore, MD;  Location: [facility];  Service: Neurosurgery;  Laterality: N/A;   Social History   Social History  . Marital Status: Married   Spouse Name: N/A  . Number of Children: N/A  . Years of Education: N/A   Occupational History  . produce [employer]   Social History Main Topics  . Smoking status: Current Every Day Smoker -- 1.00 packs/day for 44 years    Types: Cigarettes  . Smokeless tobacco: Current User    Types: Snuff  . Alcohol Use: 14.4 oz/week    24 Standard drinks or equivalent per week     Comment: social beer  . Drug Use: No  . Sexual Activity:    Partners: Female   Other Topics Concern  . None   Social History Narrative   Exercise--no   Allergies  Allergen Reactions  . Cefuroxime Axetil     REACTION: hives   Family History  Problem Relation Age of Onset  . Cancer Father 38    colon   . Hypertension Mother   . Colon polyps Brother   . Aneurysm Maternal Aunt 58    brain  . Heart disease Maternal Aunt     d 14  . Cancer Paternal Uncle 57    stomach  . Aneurysm Maternal Grandfather     stomach  . Cancer Paternal Grandfather 71    stomach  . Depression Brother 20    suicide  . Cancer Paternal Uncle 13  stomach    Past medical history, social, surgical and family history all reviewed in electronic medical record.  No pertanent information unless stated regarding to the chief complaint.   Review of Systems: No headache, visual changes, nausea, vomiting, diarrhea, constipation, dizziness, abdominal pain, skin rash, fevers, chills, night sweats, weight loss, swollen lymph nodes, body aches, joint swelling, muscle aches, chest pain, shortness of breath, mood changes.   Objective Blood pressure 120/82, pulse 86, height 6' (1.829 m), weight 257 lb (116.574 kg), SpO2 96 %.  General: No apparent distress alert and oriented x3 mood and affect normal, dressed appropriately.  HEENT: Pupils equal, extraocular movements intact  Respiratory: Patient's speak in full sentences and does not appear short of breath  Cardiovascular: No lower extremity edema, non tender, no erythema  Skin:  Warm dry intact with no signs of infection or rash on extremities or on axial skeleton.  Abdomen: Soft nontender  Neuro: Cranial nerves II through XII are intact, neurovascularly intact in all extremities with 2+ DTRs and 2+ pulses.  Lymph: No lymphadenopathy of posterior or anterior cervical chain or axillae bilaterally.  Gait Antalgic gait MSK:  Non tender with full range of motion and good stability and symmetric strength and tone of shoulders, elbows, wrist, hip, and ankles bilaterally.  Knee: Right Normal to inspection with no erythema or effusion or obvious bony abnormalities. Tender over the medial joint line ROM full in flexion and extension and lower leg rotation. Ligaments with solid consistent endpoints including ACL, PCL, LCL, MCL. Positive Mcmurray's, Apley's, and Thessalonian tests. painful patellar compression. Patellar glide with mild crepitus. Patellar and quadriceps tendons unremarkable. Hamstring and quadriceps strength is normal.  Contralateral knee unremarkable  MSK US performed of: Right knee This study was ordered, performed, and interpreted by [name] D.O.  Knee: All structures visualized. Acute on chronic medial meniscal tear noted with hypoechoic changes and moderate displacement. Mostly the anterior medial and some of the posterior medial meniscus is involved. Hypoechoic changes and increasing Doppler flow noted. Patellar Tendon unremarkable on long and transverse views without effusion. No abnormality of prepatellar bursa. LCL and MCL unremarkable on long and transverse views. No abnormality of origin of medial or lateral head of the gastrocnemius.  IMPRESSION:  Acute meniscal tear. .  After informed written and verbal consent, patient was seated on exam table. Right knee was prepped with alcohol swab and utilizing anterolateral approach, patient's right knee space was injected with 4:1  marcaine 0.5%: Kenalog 40mg /dL. Patient tolerated the procedure well  without immediate complications.  Procedure note [id number]; 15 minutes spent for Therapeutic exercises as stated in above notes.  This included exercises focusing on stretching, strengthening, with significant focus on eccentric aspects.  Flexion and extension exercises working on vastus medialis oblique and hip abductor strengthening exercises. Hamstring stretches given as well. Proper technique shown and discussed handout in great detail with ATC.  All questions were discussed and answered.      Impression and Recommendations:     This case required medical decision making of moderate complexity.      Note: This dictation was prepared with Dragon dictation along with smaller phrase technology. Any transcriptional errors that result from this process are unintentional.

## 2015-09-09 NOTE — Assessment & Plan Note (Signed)
Patient does have a meniscal tear. I'll to moderate displacement noted. Large area of the medial meniscus is involved. We discussed different treatment options and patient has elected to try conservative therapy. Home exercises given, icing protocol, topical anti-inflammatory's prescribed. Injection given. Discussed icing protocol. Patient will come back and see me again in 3-4 weeks. X-rays pending. If continuing have pain or any more instability patient will need advance imaging.

## 2015-09-26 ENCOUNTER — Other Ambulatory Visit (INDEPENDENT_AMBULATORY_CARE_PROVIDER_SITE_OTHER): Payer: BLUE CROSS/BLUE SHIELD

## 2015-09-26 ENCOUNTER — Ambulatory Visit (INDEPENDENT_AMBULATORY_CARE_PROVIDER_SITE_OTHER): Payer: BLUE CROSS/BLUE SHIELD | Admitting: Internal Medicine

## 2015-09-26 ENCOUNTER — Encounter: Payer: Self-pay | Admitting: Internal Medicine

## 2015-09-26 VITALS — BP 124/80 | HR 87 | Temp 98.2°F | Resp 20 | Wt 257.0 lb

## 2015-09-26 DIAGNOSIS — R739 Hyperglycemia, unspecified: Secondary | ICD-10-CM

## 2015-09-26 DIAGNOSIS — Z0001 Encounter for general adult medical examination with abnormal findings: Secondary | ICD-10-CM

## 2015-09-26 DIAGNOSIS — Z86010 Personal history of colonic polyps: Secondary | ICD-10-CM | POA: Diagnosis not present

## 2015-09-26 DIAGNOSIS — Z0000 Encounter for general adult medical examination without abnormal findings: Secondary | ICD-10-CM | POA: Diagnosis not present

## 2015-09-26 DIAGNOSIS — E785 Hyperlipidemia, unspecified: Secondary | ICD-10-CM

## 2015-09-26 DIAGNOSIS — Z1159 Encounter for screening for other viral diseases: Secondary | ICD-10-CM

## 2015-09-26 DIAGNOSIS — R6889 Other general symptoms and signs: Secondary | ICD-10-CM

## 2015-09-26 HISTORY — DX: Hyperlipidemia, unspecified: E78.5

## 2015-09-26 LAB — HEPATIC FUNCTION PANEL
ALT: 33 U/L (ref 0–53)
AST: 16 U/L (ref 0–37)
Albumin: 4.5 g/dL (ref 3.5–5.2)
Alkaline Phosphatase: 69 U/L (ref 39–117)
Bilirubin, Direct: 0.2 mg/dL (ref 0.0–0.3)
Total Bilirubin: 0.7 mg/dL (ref 0.2–1.2)
Total Protein: 6.9 g/dL (ref 6.0–8.3)

## 2015-09-26 LAB — LIPID PANEL
Cholesterol: 136 mg/dL (ref 0–200)
HDL: 43.1 mg/dL (ref >39.00)
LDL Cholesterol: 75 mg/dL (ref 0–99)
NonHDL: 92.47
Total CHOL/HDL Ratio: 3
Triglycerides: 87 mg/dL (ref 0.0–149.0)
VLDL: 17.4 mg/dL (ref 0.0–40.0)

## 2015-09-26 LAB — HEMOGLOBIN A1C: Hgb A1c MFr Bld: 5.7 % (ref 4.6–6.5)

## 2015-09-26 NOTE — Progress Notes (Signed)
Pre visit review using our clinic review tool, if applicable. No additional management support is needed unless otherwise documented below in the visit note. 

## 2015-09-26 NOTE — Progress Notes (Signed)
Subjective:    Patient ID: [name], male    DOB: [birth year], 55 y.o.   MRN: [id number]  HPI  Here for wellness and f/u;  Overall doing ok;  Pt denies Chest pain, worsening SOB, DOE, wheezing, orthopnea, PND, worsening LE edema, palpitations, dizziness or syncope.  Pt denies neurological change such as new headache, facial or extremity weakness.  Pt denies polydipsia, polyuria, or low sugar symptoms. Pt states overall good compliance with treatment and medications, good tolerability, and has been trying to follow appropriate diet.  Pt denies worsening depressive symptoms, suicidal ideation or panic. No fever, night sweats, wt loss, loss of appetite, or other constitutional symptoms.  Pt states good ability with ADL's, has low fall risk, home safety reviewed and adequate, no other significant changes in hearing or vision, and only occasionally active with exercise.  Due for Hep C, due for f/u colonoscopy.  Pt denies polydipsia, polyuria, or low sugar symptoms such as weakness or confusion improved with po intake.  Pt states overall good compliance with meds, trying to follow lower cholesterol diet, wt overall stable but little exercise however.  Past Medical History  Diagnosis Date  . Arthritis     Back  . Colon polyps   . GERD (gastroesophageal reflux disease)     every now and then  . Lumbar surgical wound fluid collection     lumbar wound dehiscence  . Hyperlipidemia 09/26/2015   Past Surgical History  Procedure Laterality Date  . Eye surgery    . Shoulder surgery    . Neck surgery    . Spine surgery    . Tonsillectomy    . Maximum access (mas)posterior lumbar interbody fusion (plif) 1 level N/A 02/14/2014    Procedure: FOR MAXIMUM ACCESS SURGERY POSTERIOR LUMBAR INTERBODY FUSION LUMBAR TWO-THREE;  Surgeon: Eustace Moore, MD;  Location: [facility];  Service: Neurosurgery;  Laterality: N/A;  . Colonoscopy w/ biopsies and polypectomy    . Lumbar wound debridement N/A 03/21/2014     Procedure: LUMBAR WOUND DEBRIDEMENT;  Surgeon: Eustace Moore, MD;  Location: [place];  Service: Neurosurgery;  Laterality: N/A;    reports that he has been smoking Cigarettes.  He has a 44 pack-year smoking history. His smokeless tobacco use includes Snuff. He reports that he drinks about 14.4 oz of alcohol per week. He reports that he does not use illicit drugs. family history includes Aneurysm in his maternal grandfather; Aneurysm (age of onset: 65) in his maternal aunt; Cancer (age of onset: 12) in his paternal uncle; Cancer (age of onset: 26) in his father; Cancer (age of onset: 64) in his paternal uncle; Cancer (age of onset: 47) in his paternal grandfather; Colon polyps in his brother; Depression (age of onset: 61) in his brother; Heart disease in his maternal aunt; Hypertension in his mother. Allergies  Allergen Reactions  . Cefuroxime Axetil     REACTION: hives   Current Outpatient Prescriptions on File Prior to Visit  Medication Sig Dispense Refill  . Diclofenac Sodium (PENNSAID) 2 % SOLN Place 2 application onto the skin 2 (two) times daily. 112 g 3  . DULoxetine (CYMBALTA) 30 MG capsule Take 1 capsule by mouth daily.  2  . fluticasone (FLONASE) 50 MCG/ACT nasal spray Place 2 sprays into both nostrils daily. 16 g 1  . meloxicam (MOBIC) 15 MG tablet Take 15 mg by mouth daily. with food  2  . Oxycodone HCl 10 MG TABS Take 10 mg by  mouth every 6 (six) hours as needed.  0  . rosuvastatin (CRESTOR) 10 MG tablet Take 1 tablet (10 mg total) by mouth daily. 90 tablet 3  . tiZANidine (ZANAFLEX) 4 MG capsule Take 1 capsule by mouth 3 (three) times daily as needed.  2   No current facility-administered medications on file prior to visit.   Review of Systems Constitutional: Negative for increased diaphoresis, or other activity, appetite or siginficant weight change other than noted HENT: Negative for worsening hearing loss, ear pain, facial swelling, mouth sores and neck stiffness.     Eyes: Negative for other worsening pain, redness or visual disturbance.  Respiratory: Negative for choking or stridor Cardiovascular: Negative for other chest pain and palpitations.  Gastrointestinal: Negative for worsening diarrhea, blood in stool, or abdominal distention Genitourinary: Negative for hematuria, flank pain or change in urine volume.  Musculoskeletal: Negative for myalgias or other joint complaints.  Skin: Negative for other color change and wound or drainage.  Neurological: Negative for syncope and numbness. other than noted Hematological: Negative for adenopathy. or other swelling Psychiatric/Behavioral: Negative for hallucinations, SI, self-injury, decreased concentration or other worsening agitation.      Objective:   Physical Exam BP 124/80 mmHg  Pulse 87  Temp(Src) 98.2 F (36.8 C) (Oral)  Resp 20  Wt 257 lb (116.574 kg)  SpO2 98% VS noted,  Constitutional: Pt is oriented to person, place, and time. Appears well-developed and well-nourished, in no significant distress Head: Normocephalic and atraumatic  Eyes: Conjunctivae and EOM are normal. Pupils are equal, round, and reactive to light Right Ear: External ear normal.  Left Ear: External ear normal Nose: Nose normal.  Mouth/Throat: Oropharynx is clear and moist  Neck: Normal range of motion. Neck supple. No JVD present. No tracheal deviation present or significant neck LA or mass Cardiovascular: Normal rate, regular rhythm, normal heart sounds and intact distal pulses.   Pulmonary/Chest: Effort normal and breath sounds without rales or wheezing  Abdominal: Soft. Bowel sounds are normal. NT. No HSM  Musculoskeletal: Normal range of motion. Exhibits no edema Lymphadenopathy: Has no cervical adenopathy.  Neurological: Pt is alert and oriented to person, place, and time. Pt has normal reflexes. No cranial nerve deficit. Motor grossly intact Skin: Skin is warm and dry. No rash noted or new ulcers Psychiatric:   Has normal mood and affect. Behavior is normal.      Assessment & Plan:

## 2015-09-26 NOTE — Patient Instructions (Signed)

## 2015-09-27 ENCOUNTER — Encounter: Payer: Self-pay | Admitting: Internal Medicine

## 2015-09-27 LAB — HEPATITIS C ANTIBODY: HCV Ab: NEGATIVE

## 2015-09-27 NOTE — Assessment & Plan Note (Signed)
stable overall by history and exam, recent data reviewed with pt, and pt to continue medical treatment as before,  to f/u any worsening symptoms or concerns Lab Results  Component Value Date   HGBA1C 5.7 09/26/2015

## 2015-09-27 NOTE — Assessment & Plan Note (Signed)
Also for colonoscopy as he is due

## 2015-09-27 NOTE — Assessment & Plan Note (Signed)
Mild, asympt, working on diet and wt control, for a1c,  to f/u any worsening symptoms or concerns

## 2015-09-27 NOTE — Assessment & Plan Note (Signed)

## 2015-09-30 ENCOUNTER — Encounter: Payer: Self-pay | Admitting: Family Medicine

## 2015-09-30 ENCOUNTER — Ambulatory Visit (INDEPENDENT_AMBULATORY_CARE_PROVIDER_SITE_OTHER): Payer: BLUE CROSS/BLUE SHIELD | Admitting: Family Medicine

## 2015-09-30 ENCOUNTER — Other Ambulatory Visit: Payer: Self-pay

## 2015-09-30 VITALS — BP 116/80 | HR 88 | Ht 72.0 in | Wt 257.0 lb

## 2015-09-30 DIAGNOSIS — M25511 Pain in right shoulder: Secondary | ICD-10-CM

## 2015-09-30 DIAGNOSIS — M7551 Bursitis of right shoulder: Secondary | ICD-10-CM | POA: Diagnosis not present

## 2015-09-30 DIAGNOSIS — S83242D Other tear of medial meniscus, current injury, left knee, subsequent encounter: Secondary | ICD-10-CM

## 2015-09-30 NOTE — Progress Notes (Signed)
Pre visit review using our clinic review tool, if applicable. No additional management support is needed unless otherwise documented below in the visit note. 

## 2015-09-30 NOTE — Patient Instructions (Signed)
Good to see you  [name] is your friend Stay active but would still avoid any squatting and twisting motions.  Good shoes with rigid bottom.  Jalene Mullet, Merrell or New balance greater then 700 We injected the shoulder to see if it helps See me again in 6 weeks or call me sooner if you want the MRI on your left knee.
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
## 2015-09-30 NOTE — Assessment & Plan Note (Signed)
Patient is not significant improvement with the injection. We discussed the next possibility of formal physical therapy and MRI but due to secondary to financial constraints patient wants to decline at this time. Once to continue on conservative therapy. We discussed this could worsen especially with patient's other comorbidities including his back pain. Patient will monitor. Continue the topical anti-inflammatories, icing a muscle relaxer. He is artery on chronic pain medicines for his back pain. Discussed which activities to avoid. Follow-up again in 4-6 weeks.

## 2015-09-30 NOTE — Progress Notes (Signed)
[name] [address] [place], [place] [address] Phone: [phone] Subjective:     CC: left knee pain f/u  [id number] [name] is a 55 y.o. male coming in with complaint of left knee pain. Patient was seen previously 3 weeks ago and was diagnosed with a meniscal tear. Patient elected try an injection as well as conservative therapy including home exercises. Patient states the injection did help for proximal a week but now is having the same discomfort. States that the swelling is down. An states that if he stands for long amount time or twist it feels like his knee is going to buckle. Denies any catching on him. Denies any radiation of pain. Still affecting daily activities. Unable to be active and going out more than 6 stairs at a time can cause severe pain. Still mostly on the medial aspect of the knee  Patient is also complaining of a new problem. Right shoulder pain. Patient has a history of a labrum repair multiple years ago after an accident. Patient states that he feels like he is losing range of motion. Has been doing more activity secondary to the knee pain. Has to use his upper body more. Patient states that it can hurt her at night. States localize on the anterior and posterior aspect of the shoulder. No radiation down the arm. No associated neck pain. Rates the severity of 6 out of 10.    Past Medical History  Diagnosis Date  . Arthritis     Back  . Colon polyps   . GERD (gastroesophageal reflux disease)     every now and then  . Lumbar surgical wound fluid collection     lumbar wound dehiscence  . Hyperlipidemia 09/26/2015   Past Surgical History  Procedure Laterality Date  . Eye surgery    . Shoulder surgery    . Neck surgery    . Spine surgery    . Tonsillectomy    . Maximum access (mas)posterior lumbar interbody fusion (plif) 1 level N/A 02/14/2014    Procedure: FOR MAXIMUM ACCESS SURGERY POSTERIOR LUMBAR INTERBODY  FUSION LUMBAR TWO-THREE;  Surgeon: Eustace Moore, MD;  Location: [facility];  Service: Neurosurgery;  Laterality: N/A;  . Colonoscopy w/ biopsies and polypectomy    . Lumbar wound debridement N/A 03/21/2014    Procedure: LUMBAR WOUND DEBRIDEMENT;  Surgeon: Eustace Moore, MD;  Location: [facility];  Service: Neurosurgery;  Laterality: N/A;   Social History   Social History  . Marital Status: Married    Spouse Name: N/A  . Number of Children: N/A  . Years of Education: N/A   Occupational History  . produce [employer]   Social History Main Topics  . Smoking status: Current Every Day Smoker -- 1.00 packs/day for 44 years    Types: Cigarettes  . Smokeless tobacco: Current User    Types: Snuff  . Alcohol Use: 14.4 oz/week    24 Standard drinks or equivalent per week     Comment: social beer  . Drug Use: No  . Sexual Activity:    Partners: Female   Other Topics Concern  . None   Social History Narrative   Exercise--no   Allergies  Allergen Reactions  . Cefuroxime Axetil     REACTION: hives   Family History  Problem Relation Age of Onset  . Cancer Father 71    colon   . Hypertension Mother   . Colon  polyps Brother   . Aneurysm Maternal Aunt 58    brain  . Heart disease Maternal Aunt     d 70  . Cancer Paternal Uncle 5    stomach  . Aneurysm Maternal Grandfather     stomach  . Cancer Paternal Grandfather 19    stomach  . Depression Brother 20    suicide  . Cancer Paternal Uncle 21    stomach    Past medical history, social, surgical and family history all reviewed in electronic medical record.  No pertanent information unless stated regarding to the chief complaint.   Review of Systems: No headache, visual changes, nausea, vomiting, diarrhea, constipation, dizziness, abdominal pain, skin rash, fevers, chills, night sweats, weight loss, swollen lymph nodes, chest pain, shortness of breath, mood changes.   Objective Blood pressure 116/80, pulse  88, height 6' (1.829 m), weight 257 lb (116.574 kg), SpO2 95 %.  General: No apparent distress alert and oriented x3 mood and affect normal, dressed appropriately.  HEENT: Pupils equal, extraocular movements intact  Respiratory: Patient's speak in full sentences and does not appear short of breath  Cardiovascular: No lower extremity edema, non tender, no erythema  Skin: Warm dry intact with no signs of infection or rash on extremities or on axial skeleton.  Abdomen: Soft nontender  Neuro: Cranial nerves II through XII are intact, neurovascularly intact in all extremities with 2+ DTRs and 2+ pulses.  Lymph: No lymphadenopathy of posterior or anterior cervical chain or axillae bilaterally.  Gait Antalgic gait MSK:  Non tender with full range of motion and good stability and symmetric strength and tone of shoulders, elbows, wrist, hip, and ankles bilaterally.  Knee: Left Normal to inspection with no erythema or effusion or obvious bony abnormalities. Tender over the medial joint line ROM full in flexion and extension and lower leg rotation. Ligaments with solid consistent endpoints including ACL, PCL, LCL, MCL. Positive Mcmurray's, Apley's, and Thessalonian tests. painful patellar compression. Patellar glide with mild crepitus. Patellar and quadriceps tendons unremarkable. Hamstring and quadriceps strength is normal.  Contralateral knee unremarkable Minimal change from previous exam  Shoulder: Right  Inspection reveals no abnormalities, atrophy or asymmetry. Palpation is normal with no tenderness over AC joint or bicipital groove. Minimal loss of motion with external and internal rotation less than 5. Rotator cuff strength 4+ compared to 5 on the contralateral side Mild signs of impingement noted Speeds and Yergason's tests normal. No labral pathology noted with negative Obrien's, negative clunk and good stability. Normal scapular function observed. No painful arc and no drop arm  sign. No apprehension sign Contralateral shoulder unremarkable    AProcedure: Real-time Ultrasound Guided Injection of right glenohumeral joint Device: GE Logiq E  Ultrasound guided injection is preferred based studies that show increased duration, increased effect, greater accuracy, decreased procedural pain, increased response rate with ultrasound guided versus blind injection.  Verbal informed consent obtained.  Time-out conducted.  Noted no overlying erythema, induration, or other signs of local infection.  Skin prepped in a sterile fashion.  Local anesthesia: Topical Ethyl chloride.  With sterile technique and under real time ultrasound guidance:  Joint visualized.  23g 1  inch needle inserted posterior approach. Pictures taken for needle placement. Patient did have injection of 2 cc of 1% lidocaine, 2 cc of 0.5% Marcaine, and 1.0 cc of Kenalog 40 mg/dL. Completed without difficulty  Pain immediately resolved suggesting accurate placement of the medication.  Advised to call if fevers/chills, erythema, induration, drainage, or persistent bleeding.  Images permanently stored and available for review in the ultrasound unit.  Impression: Technically successful ultrasound guided injection.        Impression and Recommendations:     This case required medical decision making of moderate complexity.      Note: This dictation was prepared with Dragon dictation along with smaller phrase technology. Any transcriptional errors that result from this process are unintentional.

## 2015-10-29 ENCOUNTER — Encounter: Payer: Self-pay | Admitting: Gastroenterology

## 2015-10-30 ENCOUNTER — Ambulatory Visit (INDEPENDENT_AMBULATORY_CARE_PROVIDER_SITE_OTHER): Payer: BLUE CROSS/BLUE SHIELD | Admitting: Internal Medicine

## 2015-10-30 ENCOUNTER — Encounter: Payer: Self-pay | Admitting: Internal Medicine

## 2015-10-30 VITALS — BP 136/68 | HR 98 | Temp 98.2°F | Resp 20 | Wt 252.0 lb

## 2015-10-30 DIAGNOSIS — R238 Other skin changes: Secondary | ICD-10-CM | POA: Insufficient documentation

## 2015-10-30 DIAGNOSIS — R739 Hyperglycemia, unspecified: Secondary | ICD-10-CM | POA: Diagnosis not present

## 2015-10-30 DIAGNOSIS — M545 Low back pain: Secondary | ICD-10-CM

## 2015-10-30 DIAGNOSIS — G8929 Other chronic pain: Secondary | ICD-10-CM | POA: Insufficient documentation

## 2015-10-30 DIAGNOSIS — E785 Hyperlipidemia, unspecified: Secondary | ICD-10-CM | POA: Diagnosis not present

## 2015-10-30 DIAGNOSIS — R233 Spontaneous ecchymoses: Secondary | ICD-10-CM

## 2015-10-30 NOTE — Progress Notes (Signed)
Subjective:    Patient ID: [name], male    DOB: 1960-05-28, 55 y.o.   MRN: [id number]  HPI  Here to f/u chronic LBP and help with disability forms; s/p lumbar surgury, has ongoing pain for many years. Has chronic LBP with radiation to both buttocks and occas to knees bilat, assoc bialt mild LE weakness; s/p 5 falls in 2 years, with most recent fall assoc with direct injury to left knee with subsequent left meniscus tear with now persistent pain and considering surgical management.  Has been recommended per NS for 3 lowest lumbar level fusion, but pt has declined so far, wary of further intervention. Can only sit or stand up to 30 min each, unable to hold job, does not require assistive device to ambulate.  Has no significant UE impairment, but is unable to bend, squat, crawl, climbing.  Likely to have "bad days" more often than good days, and symtpoms interfere with work > 50% of the time.  Would require frequent breaks up to 30 minutes during a work day. Has known severe Lumbar disease - last CT lumbar on file summary: IMPRESSION: LUMBAR MYELOGRAM IMPRESSION:  Status post L2-3 posterior and interbody fusion. Solid arthrodesis with marked rightward spurring and 9-10 degrees degenerative scoliosis convex LEFT.  No dynamic instability with regard to anterolisthesis or retrolisthesis, but asymmetric loss of interspace height at L4-5 on the LEFT develops with patient upright, accompanied by significant worsening of stenosis and BILATERAL L5 nerve root impingement.  CT LUMBAR MYELOGRAM IMPRESSION:  Solid arthrodesis L2-L3. RIGHT-sided subarticular zone and foraminal zone narrowing at L1-2.  Central and leftward protrusion at L4-5. Asymmetric posterior element hypertrophy. LEFT greater than RIGHT L4 and L5 nerve root impingement.  Severe disc space narrowing at L5-S1. Advanced facet arthropathy with disc osteophyte complex projecting into both foramina, LEFT greater than  RIGHT. L5 nerve root impingement is likely, worse on the LEFT.   Electronically Signed  By: Staci Righter M.D.  On: 12/20/2014 14:03  Incidentally also mentions easy bruisability, thin skin with frequent lesions to arms, recent INR intact, no hx of von willebrands or other bleeding diathesis  Trying to follow lower chol diet.   Pt denies polydipsia, polyuria,   Pt states overall good compliance with meds, trying to follow lower cholesterol diet, wt overall stable but little exercise however.     Review of Systems  Constitutional: Negative for unusual diaphoresis or night sweats HENT: Negative for ear swelling or discharge Eyes: Negative for worsening visual haziness  Respiratory: Negative for choking and stridor.   Gastrointestinal: Negative for distension or worsening eructation Genitourinary: Negative for retention or change in urine volume.  Musculoskeletal: Negative for other MSK paior swelling Skin: Negative for color change and worsening wound Neurological: Negative for tremors and numbness other than noted  Psychiatric/Behavioral: Negative for decreased concentration or agitation other than above       Objective:   Physical Exam BP 136/68 mmHg  Pulse 98  Temp(Src) 98.2 F (36.8 C) (Oral)  Resp 20  Wt 252 lb (114.306 kg)  SpO2 95% VS noted,  Constitutional: Pt appears in no apparent distress HENT: Head: NCAT.  Right Ear: External ear normal.  Left Ear: External ear normal.  Eyes: . Pupils are equal, round, and reactive to light. Conjunctivae and EOM are normal Neck: Normal range of motion. Neck supple.  Cardiovascular: Normal rate and regular rhythm.   Pulmonary/Chest: Effort normal and breath sounds without rales or wheezing.  Abd:  Soft,  NT, ND, + BS Spine :diffuse lumbar spine tender, without swelling or rash Neurological: Pt is alert. Not confused , motor 4+/5 bilat LE's Skin: Skin is warm. No rash, no LE edema Psychiatric: Pt behavior is normal. No  agitation.     Assessment & Plan:

## 2015-10-30 NOTE — Patient Instructions (Signed)
Please continue all other medications as before, and refills have been done if requested.  Please have the pharmacy call with any other refills you may need.  Please continue your efforts at being more active, low cholesterol diet, and weight control.  Please keep your appointments with your specialists as you may have planned  Please consider referral to Hematology if you have further unusual bleeding or bruising  Your Disability paperwork will be filled out asap.  Please use Vaseline for skin tear to right arm, with small gauze cover and PAPER tape (OTC) at the drug store

## 2015-10-30 NOTE — Progress Notes (Signed)
Pre visit review using our clinic review tool, if applicable. No additional management support is needed unless otherwise documented below in the visit note. 

## 2015-11-01 NOTE — Assessment & Plan Note (Signed)
Chronic persistent, rec'd for surgury but holding off on further for now, cont pain management, has severe impairment with respect to ability to keep gainful employment, forms filled out today

## 2015-11-01 NOTE — Assessment & Plan Note (Signed)
stable overall by history and exam, recent data reviewed with pt, and pt to continue medical treatment as before,  to f/u any worsening symptoms or concerns Lab Results  Component Value Date   HGBA1C 5.7 09/26/2015

## 2015-11-01 NOTE — Assessment & Plan Note (Signed)
stable overall by history and exam, recent data reviewed with pt, and pt to continue medical treatment as before,  to f/u any worsening symptoms or concerns Lab Results  Component Value Date   LDLCALC 75 09/26/2015

## 2015-11-01 NOTE — Assessment & Plan Note (Signed)
D/w pt, to consider Heme referral , declines for now

## 2015-11-11 ENCOUNTER — Encounter: Payer: Self-pay | Admitting: Family Medicine

## 2015-11-11 ENCOUNTER — Other Ambulatory Visit (INDEPENDENT_AMBULATORY_CARE_PROVIDER_SITE_OTHER): Payer: BLUE CROSS/BLUE SHIELD

## 2015-11-11 ENCOUNTER — Ambulatory Visit (INDEPENDENT_AMBULATORY_CARE_PROVIDER_SITE_OTHER): Payer: BLUE CROSS/BLUE SHIELD | Admitting: Family Medicine

## 2015-11-11 VITALS — BP 138/82 | HR 90 | Ht 72.0 in | Wt 249.0 lb

## 2015-11-11 DIAGNOSIS — M2550 Pain in unspecified joint: Secondary | ICD-10-CM | POA: Diagnosis not present

## 2015-11-11 DIAGNOSIS — M353 Polymyalgia rheumatica: Secondary | ICD-10-CM | POA: Diagnosis not present

## 2015-11-11 DIAGNOSIS — S83242D Other tear of medial meniscus, current injury, left knee, subsequent encounter: Secondary | ICD-10-CM | POA: Diagnosis not present

## 2015-11-11 DIAGNOSIS — M25562 Pain in left knee: Secondary | ICD-10-CM

## 2015-11-11 DIAGNOSIS — Z981 Arthrodesis status: Secondary | ICD-10-CM

## 2015-11-11 LAB — IBC PANEL
Iron: 149 ug/dL (ref 42–165)
Saturation Ratios: 42.6 % (ref 20.0–50.0)
Transferrin: 250 mg/dL (ref 212.0–360.0)

## 2015-11-11 LAB — FERRITIN: Ferritin: 103.3 ng/mL (ref 22.0–322.0)

## 2015-11-11 LAB — TESTOSTERONE: Testosterone: 245.25 ng/dL — ABNORMAL LOW (ref 300.00–890.00)

## 2015-11-11 LAB — SEDIMENTATION RATE: Sed Rate: 11 mm/hr (ref 0–20)

## 2015-11-11 LAB — RHEUMATOID FACTOR: Rhuematoid fact SerPl-aCnc: <10 IU/mL (ref <=14)

## 2015-11-11 LAB — C-REACTIVE PROTEIN: CRP: 2.3 mg/dL (ref 0.5–20.0)

## 2015-11-11 LAB — TSH: TSH: 1.33 u[IU]/mL (ref 0.35–4.50)

## 2015-11-11 LAB — VITAMIN D 25 HYDROXY (VIT D DEFICIENCY, FRACTURES): VITD: 51.12 ng/mL (ref 30.00–100.00)

## 2015-11-11 LAB — URIC ACID: Uric Acid, Serum: 6.1 mg/dL (ref 4.0–7.8)

## 2015-11-11 MED ORDER — METHYLPREDNISOLONE ACETATE 80 MG/ML IJ SUSP
80.0000 mg | Freq: Once | INTRAMUSCULAR | Status: AC
  Administered 2015-11-11: 80 mg via INTRAMUSCULAR

## 2015-11-11 MED ORDER — GABAPENTIN 300 MG PO CAPS: ORAL_CAPSULE | ORAL | Status: DC

## 2015-11-11 MED ORDER — KETOROLAC TROMETHAMINE 60 MG/2ML IM SOLN
60.0000 mg | Freq: Once | INTRAMUSCULAR | Status: AC
  Administered 2015-11-11: 60 mg via INTRAMUSCULAR

## 2015-11-11 NOTE — Assessment & Plan Note (Signed)
Patient is having multiple muscle as well as joint pains. Patient has had this for quite some time and does have significant degenerative disc disease of the lumbar spine and other joints especially for his age. Feel further laboratory workup could be beneficial. We'll rule out autoimmune diseases or other deficiencies that could be contributing as well as high uric acid. If any positive findings this can change medical management.

## 2015-11-11 NOTE — Progress Notes (Signed)
[name] [address] [place], [place] [address] Phone: [phone] Subjective:     CC: left knee pain f/u New back pain [id number] [name] is a 55 y.o. male coming in with complaint of left knee pain. Patient does have a meniscal tear as well as chronic subluxation of the kneecap. Patient responded somewhat to an injection but states that still gives him trouble. Sometimes still has instability. Not doing the home exercises or wear the brace regularly. Thickening and some more substance of the brace. Patient still wants to avoid any type of surgical intervention at this time and is wondering what other conservative therapy to be done other than formal physical therapy.  Patient shoulder and could injection has been helpful and seems to be doing relatively well.  Patient is having more of low back pain. Patient discusses the pain as a dull, throbbing aching sensation. Patient's past medical history is significant for an L2-L3 arthrodesis as well as fusions.had difficulty with a wound dehiscence after surgery as well. Patient did have a CT myelogram done of this back in September 2016. This was independently visualized by me.shows the patient does have multiple levels of osteophytic changes especially at the L5-S1 with advanced facet arthropathy and L5 nerve root impingement left greater than right as well as some left L4 nerve root impingement.    Past Medical History  Diagnosis Date  . Arthritis     Back  . Colon polyps   . GERD (gastroesophageal reflux disease)     every now and then  . Lumbar surgical wound fluid collection     lumbar wound dehiscence  . Hyperlipidemia 09/26/2015   Past Surgical History  Procedure Laterality Date  . Eye surgery    . Shoulder surgery    . Neck surgery    . Spine surgery    . Tonsillectomy    . Maximum access (mas)posterior lumbar interbody fusion (plif) 1 level N/A 02/14/2014    Procedure: FOR  MAXIMUM ACCESS SURGERY POSTERIOR LUMBAR INTERBODY FUSION LUMBAR TWO-THREE;  Surgeon: Eustace Moore, MD;  Location: [facility];  Service: Neurosurgery;  Laterality: N/A;  . Colonoscopy w/ biopsies and polypectomy    . Lumbar wound debridement N/A 03/21/2014    Procedure: LUMBAR WOUND DEBRIDEMENT;  Surgeon: Eustace Moore, MD;  Location: [facility];  Service: Neurosurgery;  Laterality: N/A;   Social History   Social History  . Marital Status: Married    Spouse Name: N/A  . Number of Children: N/A  . 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 Smoking status: Current Every Day Smoker -- 1.00 packs/day for 44 years    Types: Cigarettes  . Smokeless tobacco: Current User    Types: Snuff  . Alcohol Use: 14.4 oz/week    24 Standard drinks or equivalent per week     Comment: social beer  . Drug Use: No  . Sexual Activity:    Partners: Female   Other Topics Concern  . None   Social History Narrative   Exercise--no   Allergies  Allergen Reactions  . Cefuroxime Axetil     REACTION: hives   Family History  Problem Relation Age of Onset  . Cancer Father 39    colon   . Hypertension Mother   . Colon polyps Brother   . Aneurysm Maternal Aunt 58    brain  .  Heart disease Maternal Aunt     d 42  . Cancer Paternal Uncle 14    stomach  . Aneurysm Maternal Grandfather     stomach  . Cancer Paternal Grandfather 61    stomach  . Depression Brother 20    suicide  . Cancer Paternal Uncle 77    stomach    Past medical history, social, surgical and family history all reviewed in electronic medical record.  No pertanent information unless stated regarding to the chief complaint.   Review of Systems: No headache, visual changes, nausea, vomiting, diarrhea, constipation, dizziness, abdominal pain, skin rash, fevers, chills, night sweats, weight loss, swollen lymph nodes, chest pain, shortness of breath, mood  changes.   Objective Blood pressure 138/82, pulse 90, height 6' (1.829 m), weight 249 lb (112.946 kg), SpO2 96 %.  General: No apparent distress alert and oriented x3 mood and affect normal, dressed appropriately.  HEENT: Pupils equal, extraocular movements intact  Respiratory: Patient's speak in full sentences and does not appear short of breath  Cardiovascular: No lower extremity edema, non tender, no erythema  Skin: Warm dry intact with no signs of infection or rash on extremities or on axial skeleton.  Abdomen: Soft nontender  Neuro: Cranial nerves II through XII are intact, neurovascularly intact in all extremities with 2+ DTRs and 2+ pulses.  Lymph: No lymphadenopathy of posterior or anterior cervical chain or axillae bilaterally.  Gait Antalgic gait MSK:  Non tender with full range of motion and good stability and symmetric strength and tone of shoulders, elbows, wrist, hip, and ankles bilaterally.  Knee: Left Normal to inspection with no erythema or effusion or obvious bony abnormalities. Tender over the medial joint line ROM full in flexion and extension and lower leg rotation. Ligaments with solid consistent endpoints including ACL, PCL, LCL, MCL. Positive Mcmurray's, Apley's, and Thessalonian tests. painful patellar compression. Patellar glide with mild crepitus. Patellar and quadriceps tendons unremarkable. Hamstring and quadriceps strength is normal.  Contralateral knee unremarkable Minimal change from previous exam  Shoulder: Right  Inspection reveals no abnormalities, atrophy or asymmetry. Palpation is normal with no tenderness over AC joint or bicipital groove. Improved near full range of motion Rotator cuff strength 4+ compared to 5 on the contralateral side Minimal signs of impingement Speeds and Yergason's tests normal. No labral pathology noted with negative Obrien's, negative clunk and good stability. Normal scapular function observed. No painful arc and no  drop arm sign. No apprehension sign Contralateral shoulder unremarkable  Back Exam:  Inspection: Unremarkable  Motion: Flexion 25 deg with worsening pain, Extension 15 deg, Side Bending to 25 deg bilaterally,  Rotation to 25 deg bilaterally  SLR laying: Negative  XSLR laying: Negative  Palpable tenderness: severe tenderness of the paraspinal musculature mostly on the left side FABER: positive left Sensory change: Gross sensation intact to all lumbar and sacral dermatomes.  Reflexes: 2+ at both patellar tendons, 2+ at achilles tendons, Babinski's downgoing.  Strength at foot  Plantar-flexion: 5/5 Dorsi-flexion: 5/5 Eversion: 5/5 Inversion: 5/5  Leg strength  4 out of 5 strength but symmetric      Impression and Recommendations:     This case required medical decision making of moderate complexity.      Note: This dictation was prepared with Dragon dictation along with smaller phrase technology. Any transcriptional errors that result from this process are unintentional.

## 2015-11-11 NOTE — Patient Instructions (Signed)
Good to see you  2 injections today  Gabapentin 300mg  at night Continue your other medicines Wear brace as much as you need on the knee.  Ice is your friend Labs downstairs if you have time today and will try to call you when I get them in but will take some time.  Have fun at the beach  See me again in 4 weeks and we will see if we need MRI of the knee or increase gabapentin

## 2015-11-11 NOTE — Assessment & Plan Note (Signed)
Chronic. Patient does have a meniscal tear as well as an chronic subluxation of the knee. We discussed that advance imaging could be warranted and the patient wants to hold off on any surgical intervention. She will have another 4 weeks of home exercises, and given a patella instability brace. Patient's will try this and in 4 weeks if no significant improvement encourage advance imaging. Patient is already having some instability.

## 2015-11-11 NOTE — Assessment & Plan Note (Signed)
Patient does have degenerative disc disease at multiple levels of the lumbar spine. Likely having some exacerbation of the underlying problem. Patient will be put on gabapentin. Patient has had epidurals from an outside facility previously. We discussed with patient about side effects of the gabapentin. Other than then patient is looking is likely a fall lumbar fusion if continuing have pain.

## 2015-11-11 NOTE — Progress Notes (Signed)
Pre visit review using our clinic review tool, if applicable. No additional management support is needed unless otherwise documented below in the visit note. 

## 2015-11-11 NOTE — Assessment & Plan Note (Signed)
Mild instability of the knee noted. If continuing him difficulty he has failed all conservative therapy and we need to consider possible advance imaging.

## 2015-11-12 LAB — ANA: Anti Nuclear Antibody(ANA): NEGATIVE

## 2015-11-12 LAB — CYCLIC CITRUL PEPTIDE ANTIBODY, IGG: Cyclic Citrullin Peptide Ab: <16 Units

## 2015-11-12 LAB — ANTI-DNA ANTIBODY, DOUBLE-STRANDED: ds DNA Ab: <1 IU/mL

## 2015-12-09 NOTE — Progress Notes (Signed)
[name] [place], [place] [address] Phone: [phone] Subjective:     CC: left knee pain f/u Back pain follow-up [id number]  [name] is a 55 y.o. male coming in with complaint of left knee pain. Patient does have a meniscal tear as well as chronic subluxation of the kneecap. Patient responded somewhat to an injection but states that still gives him trouble. Sometimes still has instability. Had some concern for the meniscus. Patient states he is having increasing instability. Patient states that and seems to be more painful overall. Affecting daily activities. Feels like he has almost fallen multiple times because of this knee.  Patient is having more of low back pain.  Patient's past medical history is significant for an L2-L3 arthrodesis as well as fusions.had difficulty with a wound dehiscence after surgery as well. Patient did have a CT myelogram done of this back in September 2016. This was independently visualized by me.shows the patient does have multiple levels of osteophytic changes especially at the L5-S1 with advanced facet arthropathy and L5 nerve root impingement left greater than right as well as some left L4 nerve root impingement.  Patient was seen at last exam and was started on gabapentin. Patient was also given home exercises.states that the gabapentin has been elbow. Since then it is about 60% better. States morning and he is feeling much better and is sleeping better at night.  Patient also had left unremarkable except for a low testosterone. Patient states he will discuss with primary care provider.    Past Medical History:  Diagnosis Date  . Arthritis    Back  . Colon polyps   . GERD (gastroesophageal reflux disease)    every now and then  . Hyperlipidemia 09/26/2015  . Lumbar surgical wound fluid collection    lumbar wound dehiscence   Past Surgical History:  Procedure Laterality Date  . COLONOSCOPY W/  BIOPSIES AND POLYPECTOMY    . EYE SURGERY    . LUMBAR WOUND DEBRIDEMENT N/A 03/21/2014   Procedure: LUMBAR WOUND DEBRIDEMENT;  Surgeon: Eustace Moore, MD;  Location: [facility];  Service: Neurosurgery;  Laterality: N/A;  . MAXIMUM ACCESS (MAS)POSTERIOR LUMBAR INTERBODY FUSION (PLIF) 1 LEVEL N/A 02/14/2014   Procedure: FOR MAXIMUM ACCESS SURGERY POSTERIOR LUMBAR INTERBODY FUSION LUMBAR TWO-THREE;  Surgeon: Eustace Moore, MD;  Location: [facility];  Service: Neurosurgery;  Laterality: N/A;  . NECK SURGERY    . SHOULDER SURGERY    . SPINE SURGERY    . TONSILLECTOMY     Social History   Social History  . Marital status: Married    Spouse name: N/A  . Number of children: N/A  . Years of education: N/A   Occupational History  . produce [employer]   Social History Main Topics  . Smoking status: Current Every Day Smoker    Packs/day: 1.00    Years: 44.00    Types: Cigarettes  . Smokeless tobacco: Current User    Types: Snuff  . Alcohol use 14.4 oz/week    24 Standard drinks or equivalent per week     Comment: social beer  . Drug use: No  . Sexual activity: Yes    Partners: Female   Other Topics Concern  . None   Social History Narrative   Exercise--no   Allergies  Allergen Reactions  . Cefuroxime Axetil     REACTION: hives   Family History  Problem Relation Age  of Onset  . Cancer Father 31    colon   . Hypertension Mother   . Colon polyps Brother   . Aneurysm Maternal Aunt 58    brain  . Heart disease Maternal Aunt     d 61  . Cancer Paternal Uncle 61    stomach  . Aneurysm Maternal Grandfather     stomach  . Cancer Paternal Grandfather 23    stomach  . Depression Brother 20    suicide  . Cancer Paternal Uncle 55    stomach    Past medical history, social, surgical and family history all reviewed in electronic medical record.  No pertanent information unless stated regarding to the chief complaint.   Review of Systems: No headache, visual  changes, nausea, vomiting, diarrhea, constipation, dizziness, abdominal pain, skin rash, fevers, chills, night sweats, weight loss, swollen lymph nodes, chest pain, shortness of breath, mood changes.   Objective  Blood pressure 128/82, pulse 90, weight 249 lb (112.9 kg), SpO2 95 %.  General: No apparent distress alert and oriented x3 mood and affect normal, dressed appropriately.  HEENT: Pupils equal, extraocular movements intact  Respiratory: Patient's speak in full sentences and does not appear short of breath  Cardiovascular: No lower extremity edema, non tender, no erythema  Skin: Warm dry intact with no signs of infection or rash on extremities or on axial skeleton.  Abdomen: Soft nontender  Neuro: Cranial nerves II through XII are intact, neurovascularly intact in all extremities with 2+ DTRs and 2+ pulses.  Lymph: No lymphadenopathy of posterior or anterior cervical chain or axillae bilaterally.  Gait Antalgic gait MSK:  Non tender with full range of motion and good stability and symmetric strength and tone of shoulders, elbows, wrist, hip, and ankles bilaterally.  Knee: Left Normal to inspection with no erythema or effusion or obvious bony abnormalities.Mild lateral tilt of the patella Tender over the medial joint line ROM full in flexion and extension and lower leg rotation. Ligaments with solid consistent endpoints including ACL, PCL, LCL, MCL. Positive Mcmurray's, Apley's, and Thessalonian tests. painful patellar compression worsening from previous exam Patellar glide with mild crepitus. Patellar and quadriceps tendons unremarkable. Hamstring and quadriceps strength is normal.  Contralateral knee unremarkable Worsening from previous exam  Back Exam:  Inspection: Unremarkable  Motion: Flexion 30 deg with continuedpain, Extension 15 deg, Side Bending to 25 deg bilaterally,  Rotation to 25 deg bilaterally  SLR laying: Negative  XSLR laying: Negative  Palpable tenderness:  moderate tenderness still in the paraspinal musculature of the lumbar spine Improvement from previous exam FABER: positive left Sensory change: Gross sensation intact to all lumbar and sacral dermatomes.  Reflexes: 2+ at both patellar tendons, 2+ at achilles tendons, Babinski's downgoing.  Strength at foot  Plantar-flexion: 5/5 Dorsi-flexion: 5/5 Eversion: 5/5 Inversion: 5/5  Leg strength  4 out of 5 strength but symmetric      Impression and Recommendations:     This case required medical decision making of moderate complexity.      Note: This dictation was prepared with Dragon dictation along with smaller phrase technology. Any transcriptional errors that result from this process are unintentional.

## 2015-12-10 ENCOUNTER — Encounter: Payer: Self-pay | Admitting: Family Medicine

## 2015-12-10 ENCOUNTER — Ambulatory Visit (INDEPENDENT_AMBULATORY_CARE_PROVIDER_SITE_OTHER): Payer: BLUE CROSS/BLUE SHIELD | Admitting: Family Medicine

## 2015-12-10 VITALS — BP 128/82 | HR 90 | Wt 249.0 lb

## 2015-12-10 DIAGNOSIS — S83242D Other tear of medial meniscus, current injury, left knee, subsequent encounter: Secondary | ICD-10-CM | POA: Diagnosis not present

## 2015-12-10 DIAGNOSIS — M25562 Pain in left knee: Secondary | ICD-10-CM

## 2015-12-10 DIAGNOSIS — R7989 Other specified abnormal findings of blood chemistry: Secondary | ICD-10-CM

## 2015-12-10 DIAGNOSIS — E291 Testicular hypofunction: Secondary | ICD-10-CM

## 2015-12-10 DIAGNOSIS — G8929 Other chronic pain: Secondary | ICD-10-CM

## 2015-12-10 DIAGNOSIS — M545 Low back pain: Secondary | ICD-10-CM

## 2015-12-10 MED ORDER — GABAPENTIN 100 MG PO CAPS: 100.0000 mg | ORAL_CAPSULE | Freq: Two times a day (BID) | ORAL | 3 refills | Status: DC

## 2015-12-10 NOTE — Patient Instructions (Signed)
Good to see you  I am sorry we have not made a significant progress.  Gabapentin 100mg  in AM and 100mg  in PM and 300mg  at night We will get MRI of the knee with the increasing instability and failing everything else at the moment.  Once you know when the MRI is then call us and lets see you 1-2 days after.

## 2015-12-10 NOTE — Assessment & Plan Note (Signed)
Responding well to the gabapentin. We will continue on this does. Patient will come back and see me again when he comes back for the knee and we'll discuss if continuing titration as necessary.

## 2015-12-10 NOTE — Assessment & Plan Note (Signed)
Patient is failed all conservative therapy at this time. We discussed repeating injection the patient wants to see what is going on. I do feel that advance imaging with an MRI is beneficial. Ruling out an OCD secondary to patient's chronic patellar subluxation could be beneficial. In addition of this we will have better evaluation of patient's meniscus. Depending on findings we'll see if that surgical intervention would be the best possibility or she'll restart injections with the potential for viscous supplementation. Patient will come back 1-2 days after the MRI and we'll discuss further treatment options.

## 2015-12-12 ENCOUNTER — Ambulatory Visit (AMBULATORY_SURGERY_CENTER): Payer: Self-pay | Admitting: *Deleted

## 2015-12-12 ENCOUNTER — Encounter: Payer: Self-pay | Admitting: Gastroenterology

## 2015-12-12 VITALS — Ht 71.0 in | Wt 249.0 lb

## 2015-12-12 DIAGNOSIS — Z800 Family history of malignant neoplasm of digestive organs: Secondary | ICD-10-CM

## 2015-12-12 MED ORDER — NA SULFATE-K SULFATE-MG SULF 17.5-3.13-1.6 GM/177ML PO SOLN: 1.0000 | Freq: Once | ORAL | 0 refills | Status: AC

## 2015-12-12 NOTE — Progress Notes (Signed)
No allergies to eggs or soy. No problems with anesthesia.  Pt given Emmi instructions for colonoscopy  No oxygen use  No diet drug use  

## 2015-12-26 ENCOUNTER — Encounter: Payer: Self-pay | Admitting: Gastroenterology

## 2015-12-26 ENCOUNTER — Ambulatory Visit (AMBULATORY_SURGERY_CENTER): Payer: BLUE CROSS/BLUE SHIELD | Admitting: Gastroenterology

## 2015-12-26 VITALS — BP 137/74 | HR 71 | Temp 99.1°F | Resp 20 | Ht 71.0 in | Wt 249.0 lb

## 2015-12-26 DIAGNOSIS — D123 Benign neoplasm of transverse colon: Secondary | ICD-10-CM | POA: Diagnosis not present

## 2015-12-26 DIAGNOSIS — D120 Benign neoplasm of cecum: Secondary | ICD-10-CM | POA: Diagnosis not present

## 2015-12-26 DIAGNOSIS — D122 Benign neoplasm of ascending colon: Secondary | ICD-10-CM | POA: Diagnosis not present

## 2015-12-26 DIAGNOSIS — Z800 Family history of malignant neoplasm of digestive organs: Secondary | ICD-10-CM | POA: Diagnosis not present

## 2015-12-26 DIAGNOSIS — Z1211 Encounter for screening for malignant neoplasm of colon: Secondary | ICD-10-CM | POA: Diagnosis not present

## 2015-12-26 DIAGNOSIS — D124 Benign neoplasm of descending colon: Secondary | ICD-10-CM | POA: Diagnosis not present

## 2015-12-26 MED ORDER — SODIUM CHLORIDE 0.9 % IV SOLN: 500.0000 mL | INTRAVENOUS | Status: DC

## 2015-12-26 NOTE — Op Note (Signed)
[facility] Patient Name: [name] Procedure Date: 12/26/2015 11:15 AM MRN: [id number] Endoscopist: Remo Lipps P. Havery Moros , MD Age: 55 Referring MD:  Date of Birth: 09/22/1960 Gender: Male Account #: [id number] Procedure:                Colonoscopy Indications:              Screening patient at increased risk: Family history                            of colorectal cancer in multiple relatives (father                            around age 22, uncle, and grandfather) Medicines:                Monitored Anesthesia Care Procedure:                Pre-Anesthesia Assessment:                           - Prior to the procedure, a History and Physical                            was performed, and patient medications and                            allergies were reviewed. The patient's tolerance of                            previous anesthesia was also reviewed. The risks                            and benefits of the procedure and the sedation                            options and risks were discussed with the patient.                            All questions were answered, and informed consent                            was obtained. Prior Anticoagulants: The patient has                            taken no previous anticoagulant or antiplatelet                            agents. ASA Grade Assessment: II - A patient with                            mild systemic disease. After reviewing the risks                            and benefits, the patient was deemed in  satisfactory condition to undergo the procedure.                           After obtaining informed consent, the colonoscope                            was passed under direct vision. Throughout the                            procedure, the patient's blood pressure, pulse, and                            oxygen saturations were monitored continuously. The                            Model  CF-HQ190L [id number]) scope was introduced                            through the anus and advanced to the the cecum,                            identified by appendiceal orifice and ileocecal                            valve. The colonoscopy was technically difficult                            due to significant colonic spasm, which prolonged                            the procedure, treated with glucagon. The patient                            tolerated the procedure well. The quality of the                            bowel preparation was fair. The ileocecal valve,                            appendiceal orifice, and rectum were photographed. Scope In: 11:21:21 AM Scope Out: 12:10:05 PM Scope Withdrawal Time: 0 hours 46 minutes 12 seconds  Total Procedure Duration: 0 hours 48 minutes 44 seconds  Findings:                 The perianal and digital rectal examinations were                            normal.                           A 3 mm polyp was found in the cecum. The polyp was                            sessile. The polyp was removed with a cold biopsy  forceps. Resection and retrieval were complete.                           Two sessile polyps were found in the ascending                            colon. The polyps were 6 to 8 mm in size. These                            polyps were removed with a cold snare. Resection                            and retrieval were complete.                           A 25 mm polyp was found in the transverse colon.                            The polyp was flat. The polyp was removed with a                            piecemeal technique using a cold snare. Resection                            and retrieval were complete. Area was tattooed with                            an injection of Spot (carbon black).                           Two flat polyps were found in the transverse colon.                            The polyps were  6 to 8 mm in size. These polyps                            were removed with a cold snare. Resection and                            retrieval were complete.                           A 6 mm polyp was found in the descending colon. The                            polyp was sessile. The polyp was removed with a                            cold snare. Resection and retrieval were complete.                           Multiple flat polyps were found in the rectum,  recto-sigmoid colon and sigmoid colon. The polyps                            were small in size. These have previously been                            noted and sampled and consistent with hyperplastic                            polyps.                           A few medium-mouthed diverticula were found in the                            sigmoid colon and ascending colon.                           Non-bleeding internal hemorrhoids were found during                            retroflexion.                           The colon was otherwise quite spastic which                            prolonged the procedure. Glucagon was given to help                            relieve the spasm. The exam was otherwise without                            abnormality. Complications:            No immediate complications. Estimated blood loss:                            Minimal. Estimated Blood Loss:     Estimated blood loss was minimal. Impression:               - Preparation of the colon was fair.                           - One 3 mm polyp in the cecum, removed with a cold                            biopsy forceps. Resected and retrieved.                           - Two 6 to 8 mm polyps in the ascending colon,                            removed with a cold snare. Resected and retrieved.                           -  One 25 mm polyp in the transverse colon, removed                            piecemeal using a cold snare. Resected  and                            retrieved. Tattooed.                           - Two 6 to 8 mm polyps in the transverse colon,                            removed with a cold snare. Resected and retrieved.                           - One 6 mm polyp in the descending colon, removed                            with a cold snare. Resected and retrieved.                           - Multiple small polyps in the rectum, at the                            recto-sigmoid colon and in the sigmoid colon.                           - Diverticulosis in the sigmoid colon and in the                            ascending colon.                           - Non-bleeding internal hemorrhoids.                           - Spastic colon leading to use of glucagon                           - The examination was otherwise normal. Recommendation:           - Patient has a contact number available for                            emergencies. The signs and symptoms of potential                            delayed complications were discussed with the                            patient. Return to normal activities tomorrow.                            Written discharge instructions were provided to the  patient.                           - Resume previous diet.                           - Continue present medications.                           - No aspirin, ibuprofen, naproxen, or other                            non-steroidal anti-inflammatory drugs for 2 weeks                            after polyp removal.                           - Await pathology results.                           - Repeat colonoscopy is recommended for                            surveillance in 3-6 months to reassess large                            polypectomy site given the modality in which it was                            removed Remo Lipps P. Richard Uncapher, MD 12/26/2015 12:22:33 PM This report has been signed  electronically.

## 2015-12-26 NOTE — Progress Notes (Signed)
A and O x3. Report to RN. Tolerated MAC anesthesia well. 

## 2015-12-26 NOTE — Progress Notes (Signed)
Called to room to assist during endoscopic procedure.  Patient ID and intended procedure confirmed with present staff. Received instructions for my participation in the procedure from the performing physician.  

## 2015-12-26 NOTE — Patient Instructions (Signed)
YOU HAD AN ENDOSCOPIC PROCEDURE TODAY AT [facility]:   Refer to the procedure report that was given to you for any specific questions about what was found during the examination.  If the procedure report does not answer your questions, please call your gastroenterologist to clarify.  If you requested that your care partner not be given the details of your procedure findings, then the procedure report has been included in a sealed envelope for you to review at your convenience later.  YOU SHOULD EXPECT: Some feelings of bloating in the abdomen. Passage of more gas than usual.  Walking can help get rid of the air that was put into your GI tract during the procedure and reduce the bloating. If you had a lower endoscopy (such as a colonoscopy or flexible sigmoidoscopy) you may notice spotting of blood in your stool or on the toilet paper. If you underwent a bowel prep for your procedure, you may not have a normal bowel movement for a few days.  Please Note:  You might notice some irritation and congestion in your nose or some drainage.  This is from the oxygen used during your procedure.  There is no need for concern and it should clear up in a day or so.  SYMPTOMS TO REPORT IMMEDIATELY:   Following lower endoscopy (colonoscopy or flexible sigmoidoscopy):  Excessive amounts of blood in the stool  Significant tenderness or worsening of abdominal pains  Swelling of the abdomen that is new, acute  Fever of 100F or higher   Following upper endoscopy (EGD)  Vomiting of blood or coffee ground material  New chest pain or pain under the shoulder blades  Painful or persistently difficult swallowing  New shortness of breath  Fever of 100F or higher  Black, tarry-looking stools  For urgent or emergent issues, a gastroenterologist can be reached at any hour by calling [phone].   DIET:  We do recommend a small meal at first, but then you may proceed to your regular diet.  Drink  plenty of fluids but you should avoid alcoholic beverages for 24 hours.  ACTIVITY:  You should plan to take it easy for the rest of today and you should NOT DRIVE or use heavy machinery until tomorrow (because of the sedation medicines used during the test).    FOLLOW UP: Our staff will call the number listed on your records the next business day following your procedure to check on you and address any questions or concerns that you may have regarding the information given to you following your procedure. If we do not reach you, we will leave a message.  However, if you are feeling well and you are not experiencing any problems, there is no need to return our call.  We will assume that you have returned to your regular daily activities without incident.  If any biopsies were taken you will be contacted by phone or by letter within the next 1-3 weeks.  Please call us at [phone] if you have not heard about the biopsies in 3 weeks.    SIGNATURES/CONFIDENTIALITY: You and/or your care partner have signed paperwork which will be entered into your electronic medical record.  These signatures attest to the fact that that the information above on your After Visit Summary has been reviewed and is understood.  Full responsibility of the confidentiality of this discharge information lies with you and/or your care-partner  Polyp, diverticulosis, and hemorrhoid information given.  No aspirin, ibuprofen, naproxen  ir other non steroidal anti-inflammatory drugs for 2 weeks.  Repeat colonoscopy in 3-6 months to reassess large polypectomy site.

## 2015-12-27 ENCOUNTER — Ambulatory Visit
Admission: RE | Admit: 2015-12-27 | Discharge: 2015-12-27 | Disposition: A | Discharge status: Home / Self Care | Payer: BLUE CROSS/BLUE SHIELD | Source: Ambulatory Visit | Attending: Family Medicine | Admitting: Family Medicine

## 2015-12-27 DIAGNOSIS — M25562 Pain in left knee: Secondary | ICD-10-CM

## 2015-12-29 ENCOUNTER — Telehealth: Payer: Self-pay

## 2015-12-29 ENCOUNTER — Other Ambulatory Visit: Payer: Self-pay | Admitting: *Deleted

## 2015-12-29 DIAGNOSIS — S83242A Other tear of medial meniscus, current injury, left knee, initial encounter: Secondary | ICD-10-CM

## 2015-12-29 NOTE — Telephone Encounter (Signed)
  Follow up Call-  Call back number 12/26/2015  Post procedure Call Back phone  # [phone]  Permission to leave phone message Yes  Some recent data might be hidden   Patient was called for follow up after his procedure on 12/26/2015. No answer at the number given for follow up phone call. A message was left on the answering machine.

## 2016-01-01 ENCOUNTER — Encounter: Payer: Self-pay | Admitting: Gastroenterology

## 2016-01-21 ENCOUNTER — Encounter (HOSPITAL_BASED_OUTPATIENT_CLINIC_OR_DEPARTMENT_OTHER): Payer: Self-pay | Admitting: *Deleted

## 2016-01-21 ENCOUNTER — Other Ambulatory Visit: Payer: Self-pay | Admitting: Orthopaedic Surgery

## 2016-01-23 ENCOUNTER — Ambulatory Visit (HOSPITAL_BASED_OUTPATIENT_CLINIC_OR_DEPARTMENT_OTHER): Payer: BLUE CROSS/BLUE SHIELD | Admitting: Anesthesiology

## 2016-01-23 ENCOUNTER — Encounter (HOSPITAL_BASED_OUTPATIENT_CLINIC_OR_DEPARTMENT_OTHER): Payer: Self-pay

## 2016-01-23 ENCOUNTER — Encounter (HOSPITAL_BASED_OUTPATIENT_CLINIC_OR_DEPARTMENT_OTHER)
Admission: RE | Disposition: A | Discharge status: Home / Self Care | Payer: Self-pay | Source: Ambulatory Visit | Attending: Orthopaedic Surgery

## 2016-01-23 ENCOUNTER — Ambulatory Visit (HOSPITAL_BASED_OUTPATIENT_CLINIC_OR_DEPARTMENT_OTHER)
Admission: RE | Admit: 2016-01-23 | Discharge: 2016-01-23 | Disposition: A | Discharge status: Home / Self Care | Payer: BLUE CROSS/BLUE SHIELD | Source: Ambulatory Visit | Attending: Orthopaedic Surgery | Admitting: Orthopaedic Surgery

## 2016-01-23 DIAGNOSIS — G8929 Other chronic pain: Secondary | ICD-10-CM | POA: Diagnosis not present

## 2016-01-23 DIAGNOSIS — F17290 Nicotine dependence, other tobacco product, uncomplicated: Secondary | ICD-10-CM | POA: Insufficient documentation

## 2016-01-23 DIAGNOSIS — F329 Major depressive disorder, single episode, unspecified: Secondary | ICD-10-CM | POA: Diagnosis not present

## 2016-01-23 DIAGNOSIS — Z791 Long term (current) use of non-steroidal anti-inflammatories (NSAID): Secondary | ICD-10-CM | POA: Diagnosis not present

## 2016-01-23 DIAGNOSIS — S83242A Other tear of medial meniscus, current injury, left knee, initial encounter: Secondary | ICD-10-CM | POA: Diagnosis present

## 2016-01-23 DIAGNOSIS — Z79899 Other long term (current) drug therapy: Secondary | ICD-10-CM | POA: Insufficient documentation

## 2016-01-23 DIAGNOSIS — F17210 Nicotine dependence, cigarettes, uncomplicated: Secondary | ICD-10-CM | POA: Diagnosis not present

## 2016-01-23 DIAGNOSIS — S83232A Complex tear of medial meniscus, current injury, left knee, initial encounter: Secondary | ICD-10-CM | POA: Insufficient documentation

## 2016-01-23 DIAGNOSIS — W19XXXA Unspecified fall, initial encounter: Secondary | ICD-10-CM | POA: Diagnosis not present

## 2016-01-23 DIAGNOSIS — M549 Dorsalgia, unspecified: Secondary | ICD-10-CM | POA: Insufficient documentation

## 2016-01-23 HISTORY — DX: Dorsalgia, unspecified: M54.9

## 2016-01-23 HISTORY — DX: Other chronic pain: G89.29

## 2016-01-23 HISTORY — PX: KNEE ARTHROSCOPY WITH MEDIAL MENISECTOMY: SHX5651

## 2016-01-23 SURGERY — ARTHROSCOPY, KNEE, WITH MEDIAL MENISCECTOMY
Anesthesia: General | Site: Knee | Laterality: Left

## 2016-01-23 MED ORDER — ONDANSETRON HCL 4 MG/2ML IJ SOLN
INTRAMUSCULAR | Status: DC | PRN
  Administered 2016-01-23: 4 mg via INTRAVENOUS

## 2016-01-23 MED ORDER — CLINDAMYCIN PHOSPHATE 900 MG/50ML IV SOLN
900.0000 mg | INTRAVENOUS | Status: AC
  Administered 2016-01-23: 900 mg via INTRAVENOUS

## 2016-01-23 MED ORDER — CLINDAMYCIN PHOSPHATE 900 MG/50ML IV SOLN
INTRAVENOUS | Status: AC
  Filled 2016-01-23: qty 50

## 2016-01-23 MED ORDER — FENTANYL CITRATE (PF) 100 MCG/2ML IJ SOLN
INTRAMUSCULAR | Status: AC
  Filled 2016-01-23: qty 2

## 2016-01-23 MED ORDER — ARTIFICIAL TEARS OP OINT
TOPICAL_OINTMENT | OPHTHALMIC | Status: AC
  Filled 2016-01-23: qty 3.5

## 2016-01-23 MED ORDER — LACTATED RINGERS IV SOLN
INTRAVENOUS | Status: DC
  Administered 2016-01-23: 12:00:00 via INTRAVENOUS

## 2016-01-23 MED ORDER — OXYCODONE HCL 10 MG PO TABS: 10.0000 mg | ORAL_TABLET | ORAL | 0 refills | Status: DC | PRN

## 2016-01-23 MED ORDER — BUPIVACAINE-EPINEPHRINE (PF) 0.5% -1:200000 IJ SOLN
INTRAMUSCULAR | Status: AC
  Filled 2016-01-23: qty 30

## 2016-01-23 MED ORDER — ONDANSETRON HCL 4 MG/2ML IJ SOLN
INTRAMUSCULAR | Status: AC
  Filled 2016-01-23: qty 2

## 2016-01-23 MED ORDER — LACTATED RINGERS IV SOLN: INTRAVENOUS | Status: DC

## 2016-01-23 MED ORDER — EPHEDRINE 5 MG/ML INJ
INTRAVENOUS | Status: AC
  Filled 2016-01-23: qty 10

## 2016-01-23 MED ORDER — LACTATED RINGERS IV SOLN
INTRAVENOUS | Status: DC
Start: 2016-01-23 — End: 2016-01-23

## 2016-01-23 MED ORDER — ATROPINE SULFATE 0.4 MG/ML IV SOSY
PREFILLED_SYRINGE | INTRAVENOUS | Status: AC
  Filled 2016-01-23: qty 2.5

## 2016-01-23 MED ORDER — PHENYLEPHRINE 40 MCG/ML (10ML) SYRINGE FOR IV PUSH (FOR BLOOD PRESSURE SUPPORT)
PREFILLED_SYRINGE | INTRAVENOUS | Status: AC
  Filled 2016-01-23: qty 10

## 2016-01-23 MED ORDER — BUPIVACAINE-EPINEPHRINE 0.5% -1:200000 IJ SOLN
INTRAMUSCULAR | Status: DC | PRN
  Administered 2016-01-23: 15 mL

## 2016-01-23 MED ORDER — DEXAMETHASONE SODIUM PHOSPHATE 10 MG/ML IJ SOLN
INTRAMUSCULAR | Status: AC
  Filled 2016-01-23: qty 1

## 2016-01-23 MED ORDER — MIDAZOLAM HCL 2 MG/2ML IJ SOLN
INTRAMUSCULAR | Status: AC
  Filled 2016-01-23: qty 2

## 2016-01-23 MED ORDER — FENTANYL CITRATE (PF) 100 MCG/2ML IJ SOLN
50.0000 ug | INTRAMUSCULAR | Status: DC | PRN
  Administered 2016-01-23: 100 ug via INTRAVENOUS

## 2016-01-23 MED ORDER — FENTANYL CITRATE (PF) 100 MCG/2ML IJ SOLN
25.0000 ug | INTRAMUSCULAR | Status: DC | PRN
  Administered 2016-01-23 (×2): 50 ug via INTRAVENOUS

## 2016-01-23 MED ORDER — DEXAMETHASONE SODIUM PHOSPHATE 4 MG/ML IJ SOLN
INTRAMUSCULAR | Status: DC | PRN
  Administered 2016-01-23: 10 mg via INTRAVENOUS

## 2016-01-23 MED ORDER — SUCCINYLCHOLINE CHLORIDE 200 MG/10ML IV SOSY
PREFILLED_SYRINGE | INTRAVENOUS | Status: AC
  Filled 2016-01-23: qty 10

## 2016-01-23 MED ORDER — LIDOCAINE 2% (20 MG/ML) 5 ML SYRINGE
INTRAMUSCULAR | Status: AC
  Filled 2016-01-23: qty 5

## 2016-01-23 MED ORDER — LIDOCAINE HCL (CARDIAC) 20 MG/ML IV SOLN
INTRAVENOUS | Status: DC | PRN
  Administered 2016-01-23: 50 mg via INTRAVENOUS

## 2016-01-23 MED ORDER — CHLORHEXIDINE GLUCONATE 4 % EX LIQD: 60.0000 mL | Freq: Once | CUTANEOUS | Status: DC

## 2016-01-23 MED ORDER — MEPERIDINE HCL 25 MG/ML IJ SOLN: 6.2500 mg | INTRAMUSCULAR | Status: DC | PRN

## 2016-01-23 MED ORDER — GLYCOPYRROLATE 0.2 MG/ML IJ SOLN: 0.2000 mg | Freq: Once | INTRAMUSCULAR | Status: DC | PRN

## 2016-01-23 MED ORDER — MIDAZOLAM HCL 2 MG/2ML IJ SOLN
1.0000 mg | INTRAMUSCULAR | Status: DC | PRN
  Administered 2016-01-23: 2 mg via INTRAVENOUS

## 2016-01-23 MED ORDER — SODIUM CHLORIDE 0.9 % IR SOLN
Status: DC | PRN
  Administered 2016-01-23: 3000 mL

## 2016-01-23 MED ORDER — METOCLOPRAMIDE HCL 5 MG/ML IJ SOLN: 10.0000 mg | Freq: Once | INTRAMUSCULAR | Status: DC | PRN

## 2016-01-23 MED ORDER — SCOPOLAMINE 1 MG/3DAYS TD PT72: 1.0000 | MEDICATED_PATCH | Freq: Once | TRANSDERMAL | Status: DC | PRN

## 2016-01-23 SURGICAL SUPPLY — 37 items
BANDAGE ACE 6X5 VEL STRL LF (GAUZE/BANDAGES/DRESSINGS) ×3 IMPLANT
BLADE CUDA 5.5 (BLADE) IMPLANT
BLADE CUTTER GATOR 3.5 (BLADE) ×3 IMPLANT
BLADE GREAT WHITE 4.2 (BLADE) ×2 IMPLANT
BNDG GAUZE ELAST 4 BULKY (GAUZE/BANDAGES/DRESSINGS) ×3 IMPLANT
DRAPE ARTHROSCOPY W/POUCH 114 (DRAPES) ×3 IMPLANT
DRAPE U-SHAPE 47X51 STRL (DRAPES) ×3 IMPLANT
DRSG EMULSION OIL 3X3 NADH (GAUZE/BANDAGES/DRESSINGS) ×3 IMPLANT
DURAPREP 26ML APPLICATOR (WOUND CARE) ×3 IMPLANT
ELECT MENISCUS 165MM 90D (ELECTRODE) IMPLANT
ELECT REM PT RETURN 9FT ADLT (ELECTROSURGICAL)
ELECTRODE REM PT RTRN 9FT ADLT (ELECTROSURGICAL) IMPLANT
GAUZE SPONGE 4X4 12PLY STRL (GAUZE/BANDAGES/DRESSINGS) ×3 IMPLANT
GLOVE BIO SURGEON STRL SZ 6.5 (GLOVE) ×2 IMPLANT
GLOVE BIO SURGEON STRL SZ8 (GLOVE) ×6 IMPLANT
GLOVE BIOGEL PI IND STRL 7.0 (GLOVE) ×2 IMPLANT
GLOVE BIOGEL PI IND STRL 8 (GLOVE) ×4 IMPLANT
GLOVE BIOGEL PI INDICATOR 7.0 (GLOVE) ×2
GLOVE BIOGEL PI INDICATOR 8 (GLOVE) ×2
GOWN STRL REUS W/ TWL LRG LVL3 (GOWN DISPOSABLE) ×2 IMPLANT
GOWN STRL REUS W/ TWL XL LVL3 (GOWN DISPOSABLE) ×4 IMPLANT
GOWN STRL REUS W/TWL LRG LVL3 (GOWN DISPOSABLE) ×3
GOWN STRL REUS W/TWL XL LVL3 (GOWN DISPOSABLE) ×6
IV NS IRRIG 3000ML ARTHROMATIC (IV SOLUTION) ×2 IMPLANT
KNEE WRAP E Z 3 GEL PACK (MISCELLANEOUS) ×3 IMPLANT
MANIFOLD NEPTUNE II (INSTRUMENTS) ×2 IMPLANT
PACK ARTHROSCOPY DSU (CUSTOM PROCEDURE TRAY) ×3 IMPLANT
PACK BASIN DAY SURGERY FS (CUSTOM PROCEDURE TRAY) ×3 IMPLANT
PENCIL BUTTON HOLSTER BLD 10FT (ELECTRODE) IMPLANT
PROBE BIPOLAR ATHRO 135MM 90D (MISCELLANEOUS) IMPLANT
SET ARTHROSCOPY TUBING (MISCELLANEOUS) ×3
SET ARTHROSCOPY TUBING LN (MISCELLANEOUS) ×2 IMPLANT
SHEET MEDIUM DRAPE 40X70 STRL (DRAPES) ×3 IMPLANT
SYR 3ML 18GX1 1/2 (SYRINGE) IMPLANT
TOWEL OR 17X24 6PK STRL BLUE (TOWEL DISPOSABLE) ×3 IMPLANT
TOWEL OR NON WOVEN STRL DISP B (DISPOSABLE) ×3 IMPLANT
WATER STERILE IRR 1000ML POUR (IV SOLUTION) ×3 IMPLANT

## 2016-01-23 NOTE — Anesthesia Preprocedure Evaluation (Signed)
Anesthesia Evaluation  Patient identified by MRN, date of birth, ID band Patient awake    Reviewed: Allergy & Precautions, H&P , NPO status , Patient's Chart, lab work & pertinent test results  Airway Mallampati: III  TM Distance: >3 FB Neck ROM: Full    Dental no notable dental hx. (+) Teeth Intact, Dental Advisory Given   Pulmonary Current Smoker,    Pulmonary exam normal breath sounds clear to auscultation       Cardiovascular negative cardio ROS Normal cardiovascular exam Rhythm:Regular Rate:Normal     Neuro/Psych    GI/Hepatic Neg liver ROS, GERD  ,  Endo/Other    Renal/GU negative Renal ROS     Musculoskeletal  (+) Arthritis ,   Abdominal   Peds  Hematology   Anesthesia Other Findings   Reproductive/Obstetrics                             Anesthesia Physical  Anesthesia Plan  ASA: II  Anesthesia Plan: General   Post-op Pain Management:    Induction:   Airway Management Planned: LMA  Additional Equipment:   Intra-op Plan:   Post-operative Plan:   Informed Consent: I have reviewed the patients History and Physical, chart, labs and discussed the procedure including the risks, benefits and alternatives for the proposed anesthesia with the patient or authorized representative who has indicated his/her understanding and acceptance.     Plan Discussed with: CRNA and Anesthesiologist  Anesthesia Plan Comments: (Previous Grade 3 view with DL, then glidescope grade 1 view.)        Anesthesia Quick Evaluation

## 2016-01-23 NOTE — Anesthesia Procedure Notes (Signed)
Procedure Name: LMA Insertion Date/Time: 01/23/2016 12:42 PM Performed by: Melynda Ripple D Pre-anesthesia Checklist: Patient identified, Emergency Drugs available, Suction available and Patient being monitored Patient Re-evaluated:Patient Re-evaluated prior to inductionOxygen Delivery Method: Circle system utilized Preoxygenation: Pre-oxygenation with 100% oxygen Intubation Type: IV induction Ventilation: Mask ventilation without difficulty LMA: LMA inserted LMA Size: 5.0 Number of attempts: 1 Airway Equipment and Method: Bite block Placement Confirmation: positive ETCO2 Tube secured with: Tape Dental Injury: Teeth and Oropharynx as per pre-operative assessment

## 2016-01-23 NOTE — Op Note (Signed)
#  [id number] 

## 2016-01-23 NOTE — Discharge Instructions (Signed)

## 2016-01-23 NOTE — Transfer of Care (Signed)
Immediate Anesthesia Transfer of Care Note  Patient: [name]  Procedure(s) Performed: Procedure(s): KNEE ARTHROSCOPY WITH MEDIAL MENISECTOMY (Left)  Patient Location: PACU  Anesthesia Type:General  Level of Consciousness: awake, alert  and oriented  Airway & Oxygen Therapy: Patient Spontanous Breathing and Patient connected to face mask oxygen  Post-op Assessment: Report given to RN and Post -op Vital signs reviewed and stable  Post vital signs: Reviewed and stable  Last Vitals:  Vitals:   01/23/16 1112 01/23/16 1322  BP: 139/88   Pulse: 84 87  Resp: (!) 84   Temp: 36.9 C     Last Pain:  Vitals:   01/23/16 1112  TempSrc: Oral  PainSc: 3       Patients Stated Pain Goal: 3 ([id number])  Complications: No apparent anesthesia complications

## 2016-01-23 NOTE — Interval H&P Note (Signed)
History and Physical Interval Note:  01/23/2016 12:33 PM  [facility] Lanius Gehring  has presented today for surgery, with the diagnosis of MEDIAL MENISCUS TEAR LEFT KNEE  The various methods of treatment have been discussed with the patient and family. After consideration of risks, benefits and other options for treatment, the patient has consented to  Procedure(s): KNEE ARTHROSCOPY WITH MENISCAL REPAIR (Left) as a surgical intervention .  The patient's history has been reviewed, patient examined, no change in status, stable for surgery.  I have reviewed the patient's chart and labs.  Questions were answered to the patient's satisfaction.     Armas Mcbee G

## 2016-01-23 NOTE — Anesthesia Postprocedure Evaluation (Signed)
Anesthesia Post Note  Patient: [name]  Procedure(s) Performed: Procedure(s) (LRB): KNEE ARTHROSCOPY WITH MEDIAL MENISECTOMY (Left)  Patient location during evaluation: PACU Anesthesia Type: General Level of consciousness: awake Pain management: pain level controlled Vital Signs Assessment: post-procedure vital signs reviewed and stable Respiratory status: spontaneous breathing Cardiovascular status: stable Anesthetic complications: no    Last Vitals:  Vitals:   01/23/16 1345 01/23/16 1358  BP: (!) 141/86 137/87  Pulse: 80 83  Resp: 14 (!) 9  Temp:      Last Pain:  Vitals:   01/23/16 1358  TempSrc:   PainSc: 2                  EDWARDS,Cirilo Canner

## 2016-01-23 NOTE — H&P (Signed)
[name] is an 55 y.o. male.   Chief Complaint: Left knee pain [name] is a 55 year old man on disability due to some back issues. He is here in referral through Dr. [name] about his left knee. He's been symptomatic for many months after a couple of falls that he took. He's been managed with bracing and some pills and an injection. He persists with significant medial joint line pain which is intermittent and severe. This does wake him from sleep. It makes walking any distance difficult.    MRI:  I reviewed an MRI scan films and report of a study done at [facility] on 12/27/15. This shows a complex tear of the medial meniscus with very little degenerative change.  Past Medical History:  Diagnosis Date  . Allergy   . Anxiety   . Arthritis    Back  . Chronic back pain   . Colon polyps   . Depression   . GERD (gastroesophageal reflux disease)    every now and then  . Hyperlipidemia 09/26/2015  . Lumbar surgical wound fluid collection    lumbar wound dehiscence    Past Surgical History:  Procedure Laterality Date  . BACK SURGERY    . COLONOSCOPY W/ BIOPSIES AND POLYPECTOMY    . EYE SURGERY    . LUMBAR WOUND DEBRIDEMENT N/A 03/21/2014   Procedure: LUMBAR WOUND DEBRIDEMENT;  Surgeon: Eustace Moore, MD;  Location: [facility];  Service: Neurosurgery;  Laterality: N/A;  . MAXIMUM ACCESS (MAS)POSTERIOR LUMBAR INTERBODY FUSION (PLIF) 1 LEVEL N/A 02/14/2014   Procedure: FOR MAXIMUM ACCESS SURGERY POSTERIOR LUMBAR INTERBODY FUSION LUMBAR TWO-THREE;  Surgeon: Eustace Moore, MD;  Location: [facility];  Service: Neurosurgery;  Laterality: N/A;  . NECK SURGERY    . SHOULDER SURGERY    . SPINE SURGERY    . TONSILLECTOMY      Family History  Problem Relation Age of Onset  . Cancer Father 32    colon   . Colon cancer Father 35    pt thinks father had colon cancer  . Stomach cancer Father   . Hypertension Mother   . Colon polyps Brother   . Aneurysm Maternal Aunt 58     brain  . Heart disease Maternal Aunt     d 51  . Cancer Paternal Uncle 59    stomach  . Aneurysm Maternal Grandfather     stomach  . Colon cancer Maternal Grandfather     thinks grandfather had colon cancer  . Cancer Paternal Grandfather 35    stomach  . Depression Brother 20    suicide  . Cancer Paternal Uncle 12    stomach  . Esophageal cancer Neg Hx   . Rectal cancer Neg Hx   . Diabetes Neg Hx    Social History:  reports that he has been smoking Cigarettes.  He has a 44.00 pack-year smoking history. His smokeless tobacco use includes Snuff. He reports that he drinks about 14.4 oz of alcohol per week . He reports that he does not use drugs.  Allergies:  Allergies  Allergen Reactions  . Cefuroxime Axetil     REACTION: hives    Medications Prior to Admission  Medication Sig Dispense Refill  . DULoxetine (CYMBALTA) 30 MG capsule Take 1 capsule by mouth daily.  2  . gabapentin (NEURONTIN) 100 MG capsule Take 1 capsule (100 mg total) by mouth 2 (two) times daily. 60 capsule 3  . gabapentin (NEURONTIN) 300 MG capsule  nightly 30 capsule 3  . meloxicam (MOBIC) 15 MG tablet Take 15 mg by mouth daily. with food  2  . Oxycodone HCl 10 MG TABS Take 10 mg by mouth every 6 (six) hours as needed.  0  . tiZANidine (ZANAFLEX) 4 MG capsule Take 1 capsule by mouth 3 (three) times daily as needed.  2  . fluticasone (FLONASE) 50 MCG/ACT nasal spray Place 2 sprays into both nostrils daily. 16 g 1    No results found for this or any previous visit (from the past 48 hour(s)). No results found.  Review of Systems  Musculoskeletal: Positive for joint pain.       Left knee  All other systems reviewed and are negative.   Blood pressure 139/88, pulse 84, temperature 98.5 F (36.9 C), temperature source Oral, resp. rate (!) 84, height 5\' 11"  (1.803 m), weight 110.5 kg (243 lb 8 oz), SpO2 97 %. Physical Exam  Constitutional: He is oriented to person, place, and time. He appears  well-developed and well-nourished.  HENT:  Head: Normocephalic and atraumatic.  Eyes: Pupils are equal, round, and reactive to light.  Neck: Normal range of motion.  Cardiovascular: Normal rate and regular rhythm.   Respiratory: Effort normal.  GI: Soft.  Musculoskeletal:  Left knee has trace effusion. He has intense medial joint line pain. Motion is about 0-120 at which point he has some terrible pain. McMurray's test is positive for pain. Ligaments feel stable. Hip motion is full and straight leg raise is negative. Sensation and motor function are intact in his feet with palpable pulses on both sides.   Neurological: He is alert and oriented to person, place, and time.  Skin: Skin is warm and dry.  Psychiatric: He has a normal mood and affect. His behavior is normal. Judgment and thought content normal.     Assessment/Plan Assessment: Left knee torn medial meniscus by MRI 2017  Plan: [name] really needs to consider a knee arthroscopy. He's been symptomatic for many months despite bracing, pills, and an injection. He has a complex tear seen on the MRI scan and a paucity of degenerative change. I reviewed risks of anesthesia and infection related to this procedure and the fact that he might need some postoperative physical therapy. He would like to set it up.  [name], Larwance Sachs, PA-C 01/23/2016, 12:29 PM

## 2016-01-23 NOTE — Anesthesia Procedure Notes (Signed)
Date/Time: 01/23/2016 12:42 PM Performed by: Melynda Ripple D

## 2016-01-26 ENCOUNTER — Encounter (HOSPITAL_BASED_OUTPATIENT_CLINIC_OR_DEPARTMENT_OTHER): Payer: Self-pay | Admitting: Orthopaedic Surgery

## 2016-01-26 NOTE — Addendum Note (Signed)
Addendum  created 01/26/16 1105 by Tawni Millers, CRNA   Charge Capture section accepted

## 2016-01-26 NOTE — Op Note (Signed)
[name], [name]             ACCOUNT NO.:  [id number]  MEDICAL RECORD NO.:  [id number]  LOCATION:                                 FACILITY:  PHYSICIAN:  Monico Blitz. Johnpatrick Jenny, M.D.DATE OF BIRTH:  [birth year]  DATE OF PROCEDURE:  01/23/2016 DATE OF DISCHARGE:                              OPERATIVE REPORT   PREOPERATIVE DIAGNOSIS:  Left knee torn medial meniscus.  POSTOPERATIVE DIAGNOSIS:  Left knee torn medial meniscus.  PROCEDURE:  Left knee partial medial meniscectomy.  ANESTHESIA:  General and block.  ATTENDING SURGEON:  Monico Blitz. Rhona Raider, M.D.  ASSISTANT:  Loni Dolly, PA.  INDICATION FOR PROCEDURE:  The patient is a 55 year old man with many months of left knee pain after a couple of falls.  He was managed with bracing and some medications and an injection.  With continued joint line pain, he underwent an MRI scan, which showed a complex tear of the medial meniscus with very little degenerative change.  His pain limits his ability to walk and rest, and he is offered an arthroscopy. Informed operative consent was obtained after discussion of possible complications including reaction to anesthesia and infection.  SUMMARY OF FINDINGS AND PROCEDURE:  Under general anesthesia and a block, a left knee arthroscopy was performed.  Suprapatellar pouch was benign as was the patellofemoral joint.  Medial compartment exhibited a radial tear of the posterior horn, addressed with about 15% partial medial meniscectomy back to stable tissues.  He had no degenerative changes here.  The lateral compartment was completely benign with no evidence of meniscal articular cartilage injury.  The ACL was intact. He was scheduled to go home same day.  DESCRIPTION OF PROCEDURE:  The patient was taken to the operating suite, where general anesthetic was applied without difficulty.  He was positioned supine and prepped and draped normal sterile fashion.  He did receive a block in the  pre-anesthesia area.  After the administration of IV clindamycin and an appropriate time-out, an arthroscopy of the left knee was performed through a total of 2 portals.  Findings were as noted above and procedure consisted of the partial medial meniscectomy which was done with a basket and shaver contouring his radial tear back to a stable margin.  This radial tear did extend to the periphery of meniscus centrally.  The knee was thoroughly irrigated followed by removal of arthroscopic equipment.  Adaptic was placed over the portals followed by dry gauze and a loose Ace wrap.  Estimated blood loss and fluids obtain from anesthesia records.  DISPOSITION:  The patient was extubated in the operating room and taken to the recovery room in stable addition.  He was to go home same-day and follow up in the office in in less than a week.  I will contact him by phone tonight.     Monico Blitz Rhona Raider, M.D.   ______________________________ Monico Blitz. Rhona Raider, M.D.    PGD/MEDQ  D:  01/23/2016  T:  01/24/2016  Job:  [id number]

## 2016-02-18 ENCOUNTER — Encounter: Payer: Self-pay | Admitting: Gastroenterology

## 2016-03-03 ENCOUNTER — Other Ambulatory Visit: Payer: Self-pay | Admitting: Family Medicine

## 2016-03-04 NOTE — Telephone Encounter (Signed)
Refill done.  

## 2016-03-17 ENCOUNTER — Encounter: Payer: Self-pay | Admitting: Gastroenterology

## 2016-03-29 ENCOUNTER — Other Ambulatory Visit: Payer: Self-pay | Admitting: Family Medicine

## 2016-03-29 NOTE — Telephone Encounter (Signed)
Refill done.  

## 2016-04-05 ENCOUNTER — Other Ambulatory Visit: Payer: Self-pay | Admitting: Orthopaedic Surgery

## 2016-04-05 DIAGNOSIS — M25511 Pain in right shoulder: Secondary | ICD-10-CM

## 2016-04-13 ENCOUNTER — Ambulatory Visit
Admission: RE | Admit: 2016-04-13 | Discharge: 2016-04-13 | Disposition: A | Discharge status: Home / Self Care | Payer: BLUE CROSS/BLUE SHIELD | Source: Ambulatory Visit | Attending: Orthopaedic Surgery | Admitting: Orthopaedic Surgery

## 2016-04-13 DIAGNOSIS — M25511 Pain in right shoulder: Secondary | ICD-10-CM

## 2016-05-04 ENCOUNTER — Encounter: Payer: BLUE CROSS/BLUE SHIELD | Admitting: Gastroenterology

## 2016-07-06 ENCOUNTER — Other Ambulatory Visit: Payer: Self-pay | Admitting: Family Medicine

## 2016-07-15 ENCOUNTER — Other Ambulatory Visit: Payer: Self-pay | Admitting: Orthopaedic Surgery

## 2016-07-15 DIAGNOSIS — M25511 Pain in right shoulder: Secondary | ICD-10-CM

## 2016-07-21 ENCOUNTER — Telehealth: Payer: Self-pay

## 2016-07-21 ENCOUNTER — Other Ambulatory Visit: Payer: Self-pay

## 2016-07-21 ENCOUNTER — Other Ambulatory Visit: Payer: Self-pay | Admitting: Family Medicine

## 2016-07-21 DIAGNOSIS — Z86010 Personal history of colonic polyps: Secondary | ICD-10-CM

## 2016-07-21 MED ORDER — NA SULFATE-K SULFATE-MG SULF 17.5-3.13-1.6 GM/177ML PO SOLN
1.0000 | Freq: Once | ORAL | 0 refills | Status: AC
Start: 2016-07-21 — End: 2016-07-21

## 2016-07-21 NOTE — Telephone Encounter (Signed)
Pt has colon scheduled for May. Order has been put in for procedure and Suprep sent to pts pharm. Instructions have been mailed and he will come by to sign consent form no later than May 9th.

## 2016-07-21 NOTE — Telephone Encounter (Signed)
-----   Message from Manus Gunning, MD sent at 07/21/2016  8:17 AM EDT ----- Regarding: follow up colonoscopy Hi Caryl Pina, This is another patient who is overdue for a follow up colonoscopy (large polyp removed last summer, has not followed up). Can you please call him to schedule and let me know? Thanks

## 2016-07-22 NOTE — Telephone Encounter (Signed)
Great, thanks very much

## 2016-09-02 ENCOUNTER — Encounter: Payer: Self-pay | Admitting: Gastroenterology

## 2016-09-16 ENCOUNTER — Ambulatory Visit (AMBULATORY_SURGERY_CENTER): Payer: BLUE CROSS/BLUE SHIELD | Admitting: Gastroenterology

## 2016-09-16 ENCOUNTER — Encounter: Payer: Self-pay | Admitting: Gastroenterology

## 2016-09-16 VITALS — BP 114/61 | HR 68 | Temp 99.1°F | Resp 12 | Ht 71.0 in | Wt 249.0 lb

## 2016-09-16 DIAGNOSIS — D123 Benign neoplasm of transverse colon: Secondary | ICD-10-CM

## 2016-09-16 DIAGNOSIS — K6389 Other specified diseases of intestine: Secondary | ICD-10-CM | POA: Diagnosis not present

## 2016-09-16 DIAGNOSIS — Z86010 Personal history of colonic polyps: Secondary | ICD-10-CM | POA: Diagnosis not present

## 2016-09-16 DIAGNOSIS — Z800 Family history of malignant neoplasm of digestive organs: Secondary | ICD-10-CM

## 2016-09-16 DIAGNOSIS — D125 Benign neoplasm of sigmoid colon: Secondary | ICD-10-CM

## 2016-09-16 DIAGNOSIS — D126 Benign neoplasm of colon, unspecified: Secondary | ICD-10-CM | POA: Diagnosis not present

## 2016-09-16 DIAGNOSIS — K635 Polyp of colon: Secondary | ICD-10-CM | POA: Diagnosis not present

## 2016-09-16 MED ORDER — SODIUM CHLORIDE 0.9 % IV SOLN: 500.0000 mL | INTRAVENOUS | Status: DC

## 2016-09-16 NOTE — Progress Notes (Signed)
Called to room to assist during endoscopic procedure.  Patient ID and intended procedure confirmed with present staff. Received instructions for my participation in the procedure from the performing physician.  

## 2016-09-16 NOTE — Op Note (Signed)
[facility] Patient Name: [name] Procedure Date: 09/16/2016 7:56 AM MRN: [id number] Endoscopist: Remo Lipps P. Didi Ganaway MD, MD Age: 56 Referring MD:  Date of Birth: 10-Jan-1961 Gender: Male Account #: [id number] Procedure:                Colonoscopy Indications:              large flat sessile serrated lesion removed in                            piecemeal last summer, amongst other adenomas, here                            for surveillance Medicines:                Monitored Anesthesia Care Procedure:                Pre-Anesthesia Assessment:                           - Prior to the procedure, a History and Physical                            was performed, and patient medications and                            allergies were reviewed. The patient's tolerance of                            previous anesthesia was also reviewed. The risks                            and benefits of the procedure and the sedation                            options and risks were discussed with the patient.                            All questions were answered, and informed consent                            was obtained. Prior Anticoagulants: The patient has                            taken no previous anticoagulant or antiplatelet                            agents. ASA Grade Assessment: II - A patient with                            mild systemic disease. After reviewing the risks                            and benefits, the patient was deemed in  satisfactory condition to undergo the procedure.                           After obtaining informed consent, the colonoscope                            was passed under direct vision. Throughout the                            procedure, the patient's blood pressure, pulse, and                            oxygen saturations were monitored continuously. The                            Model CF-HQ190L [id number]) scope  was introduced                            through the anus and advanced to the the cecum,                            identified by appendiceal orifice and ileocecal                            valve. The colonoscopy was performed without                            difficulty. The patient tolerated the procedure                            well. The quality of the bowel preparation was                            adequate. The ileocecal valve, appendiceal orifice,                            and rectum were photographed. Scope In: 8:09:49 AM Scope Out: 8:30:02 AM Scope Withdrawal Time: 0 hours 18 minutes 14 seconds  Total Procedure Duration: 0 hours 20 minutes 13 seconds  Findings:                 The perianal and digital rectal examinations were                            normal.                           A 4 mm polyp was found in the transverse colon. The                            polyp was sessile. The polyp was removed with a                            cold snare. Resection and retrieval were complete.  A large post polypectomy scar was found in the                            transverse colon. The scar tissue was healthy in                            appearance with a questionable diminutive area of                            residual polyp tissue versus normal scar tissue.                            Biopsies were taken to remove this with cold                            forceps with a cold forceps for histology.                           Two flat polyps were found in the sigmoid colon,                            suspect benign hyperplastic polyps. The polyps were                            3 to 4 mm in size. These polyps were removed with a                            cold snare. Resection and retrieval were complete.                           Scattered medium-mouthed diverticula were found in                            the left colon and right colon.                            Internal hemorrhoids were found during retroflexion.                           The prep was adequate but time was taken to lavage                            the colon to achieve adequate views. The exam was                            otherwise without abnormality. Complications:            No immediate complications. Estimated blood loss:                            Minimal. Estimated Blood Loss:     Estimated blood loss was minimal. Impression:               - One 4 mm  polyp in the transverse colon, removed                            with a cold snare. Resected and retrieved.                           - Post-polypectomy scar in the transverse colon -                            possible diminutive area of residual polyp versus                            benign scar tissue. Biopsied.                           - Two 3 to 4 mm polyps in the sigmoid colon,                            removed with a cold snare. Resected and retrieved.                           - Diverticulosis in the left colon and in the right                            colon.                           - Internal hemorrhoids.                           - The examination was otherwise normal. Recommendation:           - Patient has a contact number available for                            emergencies. The signs and symptoms of potential                            delayed complications were discussed with the                            patient. Return to normal activities tomorrow.                            Written discharge instructions were provided to the                            patient.                           - Resume previous diet.                           - Continue present medications.                           -  Await pathology results.                           - Repeat colonoscopy is recommended for                            surveillance in 3 years given results of the                             patient's last exam                           - No ibuprofen, naproxen, or other non-steroidal                            anti-inflammatory drugs for 2 weeks after polyp                            removal. Remo Lipps P. Treveon Bourcier MD, MD 09/16/2016 8:37:10 AM This report has been signed electronically.

## 2016-09-16 NOTE — Patient Instructions (Signed)
YOU HAD AN ENDOSCOPIC PROCEDURE TODAY AT [facility]:   Refer to the procedure report that was given to you for any specific questions about what was found during the examination.  If the procedure report does not answer your questions, please call your gastroenterologist to clarify.  If you requested that your care partner not be given the details of your procedure findings, then the procedure report has been included in a sealed envelope for you to review at your convenience later.  YOU SHOULD EXPECT: Some feelings of bloating in the abdomen. Passage of more gas than usual.  Walking can help get rid of the air that was put into your GI tract during the procedure and reduce the bloating. If you had a lower endoscopy (such as a colonoscopy or flexible sigmoidoscopy) you may notice spotting of blood in your stool or on the toilet paper. If you underwent a bowel prep for your procedure, you may not have a normal bowel movement for a few days.  Please Note:  You might notice some irritation and congestion in your nose or some drainage.  This is from the oxygen used during your procedure.  There is no need for concern and it should clear up in a day or so.  SYMPTOMS TO REPORT IMMEDIATELY:   Following lower endoscopy (colonoscopy or flexible sigmoidoscopy):  Excessive amounts of blood in the stool  Significant tenderness or worsening of abdominal pains  Swelling of the abdomen that is new, acute  Fever of 100F or higher    For urgent or emergent issues, a gastroenterologist can be reached at any hour by calling [phone].   DIET:  We do recommend a small meal at first, but then you may proceed to your regular diet.  Drink plenty of fluids but you should avoid alcoholic beverages for 24 hours.  ACTIVITY:  You should plan to take it easy for the rest of today and you should NOT DRIVE or use heavy machinery until tomorrow (because of the sedation medicines used during the test).     FOLLOW UP: Our staff will call the number listed on your records the next business day following your procedure to check on you and address any questions or concerns that you may have regarding the information given to you following your procedure. If we do not reach you, we will leave a message.  However, if you are feeling well and you are not experiencing any problems, there is no need to return our call.  We will assume that you have returned to your regular daily activities without incident.  If any biopsies were taken you will be contacted by phone or by letter within the next 1-3 weeks.  Please call us at [phone] if you have not heard about the biopsies in 3 weeks.   Await for biopsy results to determined next repeat Colonoscopy screening Diverticulosis (handout given) Hemorrhoids (handout given) No Ibuprofen, naproxen, or other non-steriodal anti-inflammatory drugs for 2 weeks after polyps removal    SIGNATURES/CONFIDENTIALITY: You and/or your care partner have signed paperwork which will be entered into your electronic medical record.  These signatures attest to the fact that that the information above on your After Visit Summary has been reviewed and is understood.  Full responsibility of the confidentiality of this discharge information lies with you and/or your care-partner.

## 2016-09-16 NOTE — Progress Notes (Signed)
To recovery, report to Jones, RN, VSS 

## 2016-09-17 ENCOUNTER — Telehealth: Payer: Self-pay | Admitting: *Deleted

## 2016-09-17 NOTE — Telephone Encounter (Signed)
  Follow up Call-  Call back number 09/16/2016 09/16/2016 12/26/2015  Post procedure Call Back phone  # [phone]  Permission to leave phone message Yes Yes Yes  Some recent data might be hidden     Patient questions:  Do you have a fever, pain , or abdominal swelling? No. Pain Score  0 *  Have you tolerated food without any problems? Yes.    Have you been able to return to your normal activities? Yes.    Do you have any questions about your discharge instructions: Diet   No. Medications  No. Follow up visit  No.  Do you have questions or concerns about your Care? No.  Actions: * If pain score is 4 or above: No action needed, pain <4.

## 2016-09-22 ENCOUNTER — Encounter: Payer: Self-pay | Admitting: Gastroenterology

## 2016-11-19 ENCOUNTER — Other Ambulatory Visit: Payer: Self-pay | Admitting: Family Medicine

## 2016-11-23 NOTE — Telephone Encounter (Signed)
Refill done.  

## 2016-12-04 ENCOUNTER — Other Ambulatory Visit: Payer: Self-pay | Admitting: Family Medicine

## 2016-12-07 DIAGNOSIS — M5416 Radiculopathy, lumbar region: Secondary | ICD-10-CM | POA: Diagnosis not present

## 2016-12-07 DIAGNOSIS — R030 Elevated blood-pressure reading, without diagnosis of hypertension: Secondary | ICD-10-CM | POA: Diagnosis not present

## 2016-12-07 DIAGNOSIS — Z6835 Body mass index (BMI) 35.0-35.9, adult: Secondary | ICD-10-CM | POA: Diagnosis not present

## 2016-12-07 DIAGNOSIS — M533 Sacrococcygeal disorders, not elsewhere classified: Secondary | ICD-10-CM | POA: Diagnosis not present

## 2016-12-07 DIAGNOSIS — G8929 Other chronic pain: Secondary | ICD-10-CM | POA: Diagnosis not present

## 2017-01-13 ENCOUNTER — Other Ambulatory Visit (INDEPENDENT_AMBULATORY_CARE_PROVIDER_SITE_OTHER): Payer: Medicare Other

## 2017-01-13 ENCOUNTER — Ambulatory Visit (INDEPENDENT_AMBULATORY_CARE_PROVIDER_SITE_OTHER): Payer: Medicare Other | Admitting: Internal Medicine

## 2017-01-13 ENCOUNTER — Encounter: Payer: Self-pay | Admitting: Internal Medicine

## 2017-01-13 VITALS — BP 128/82 | HR 75 | Temp 98.2°F | Ht 71.0 in | Wt 248.0 lb

## 2017-01-13 DIAGNOSIS — R739 Hyperglycemia, unspecified: Secondary | ICD-10-CM

## 2017-01-13 DIAGNOSIS — M545 Low back pain: Secondary | ICD-10-CM | POA: Diagnosis not present

## 2017-01-13 DIAGNOSIS — Z23 Encounter for immunization: Secondary | ICD-10-CM

## 2017-01-13 DIAGNOSIS — G8929 Other chronic pain: Secondary | ICD-10-CM

## 2017-01-13 DIAGNOSIS — Z114 Encounter for screening for human immunodeficiency virus [HIV]: Secondary | ICD-10-CM

## 2017-01-13 DIAGNOSIS — Z0000 Encounter for general adult medical examination without abnormal findings: Secondary | ICD-10-CM | POA: Diagnosis not present

## 2017-01-13 LAB — LIPID PANEL
Cholesterol: 184 mg/dL (ref 0–200)
HDL: 59.9 mg/dL (ref >39.00)
LDL Cholesterol: 98 mg/dL (ref 0–99)
NonHDL: 123.86
Total CHOL/HDL Ratio: 3
Triglycerides: 130 mg/dL (ref 0.0–149.0)
VLDL: 26 mg/dL (ref 0.0–40.0)

## 2017-01-13 LAB — URINALYSIS, ROUTINE W REFLEX MICROSCOPIC
Bilirubin Urine: NEGATIVE
Ketones, ur: NEGATIVE
Leukocytes, UA: NEGATIVE
Nitrite: NEGATIVE
Specific Gravity, Urine: 1.02 (ref 1.000–1.030)
Total Protein, Urine: NEGATIVE
Urine Glucose: NEGATIVE
Urobilinogen, UA: 0.2 (ref 0.0–1.0)
pH: 6 (ref 5.0–8.0)

## 2017-01-13 LAB — CBC WITH DIFFERENTIAL/PLATELET
Basophils Absolute: 0.1 10*3/uL (ref 0.0–0.1)
Basophils Relative: 0.7 % (ref 0.0–3.0)
Eosinophils Absolute: 0.2 10*3/uL (ref 0.0–0.7)
Eosinophils Relative: 1.7 % (ref 0.0–5.0)
HCT: 51.8 % (ref 39.0–52.0)
Hemoglobin: 17.3 g/dL — ABNORMAL HIGH (ref 13.0–17.0)
Lymphocytes Relative: 17.2 % (ref 12.0–46.0)
Lymphs Abs: 1.6 10*3/uL (ref 0.7–4.0)
MCHC: 33.3 g/dL (ref 30.0–36.0)
MCV: 93.4 fl (ref 78.0–100.0)
Monocytes Absolute: 0.8 10*3/uL (ref 0.1–1.0)
Monocytes Relative: 8 % (ref 3.0–12.0)
Neutro Abs: 6.8 10*3/uL (ref 1.4–7.7)
Neutrophils Relative %: 72.4 % (ref 43.0–77.0)
Platelets: 256 10*3/uL (ref 150.0–400.0)
RBC: 5.54 Mil/uL (ref 4.22–5.81)
RDW: 13.8 % (ref 11.5–15.5)
WBC: 9.4 10*3/uL (ref 4.0–10.5)

## 2017-01-13 LAB — HEPATIC FUNCTION PANEL
ALT: 21 U/L (ref 0–53)
AST: 19 U/L (ref 0–37)
Albumin: 4.4 g/dL (ref 3.5–5.2)
Alkaline Phosphatase: 93 U/L (ref 39–117)
Bilirubin, Direct: 0.2 mg/dL (ref 0.0–0.3)
Total Bilirubin: 1 mg/dL (ref 0.2–1.2)
Total Protein: 6.5 g/dL (ref 6.0–8.3)

## 2017-01-13 LAB — BASIC METABOLIC PANEL
BUN: 11 mg/dL (ref 6–23)
CO2: 29 mEq/L (ref 19–32)
Calcium: 9.9 mg/dL (ref 8.4–10.5)
Chloride: 103 mEq/L (ref 96–112)
Creatinine, Ser: 0.8 mg/dL (ref 0.40–1.50)
GFR: 106.62 mL/min (ref >60.00)
Glucose, Bld: 116 mg/dL — ABNORMAL HIGH (ref 70–99)
Potassium: 4.9 mEq/L (ref 3.5–5.1)
Sodium: 139 mEq/L (ref 135–145)

## 2017-01-13 LAB — PSA: PSA: 0.6 ng/mL (ref 0.10–4.00)

## 2017-01-13 LAB — HIV ANTIBODY (ROUTINE TESTING W REFLEX): HIV 1&2 Ab, 4th Generation: NONREACTIVE

## 2017-01-13 LAB — HEMOGLOBIN A1C: Hgb A1c MFr Bld: 5.9 % (ref 4.6–6.5)

## 2017-01-13 LAB — TSH: TSH: 1.63 u[IU]/mL (ref 0.35–4.50)

## 2017-01-13 NOTE — Patient Instructions (Addendum)
You had the flu shot today  Please continue all other medications as before, and refills have been done if requested.  Please have the pharmacy call with any other refills you may need.  Please continue your efforts at being more active, low cholesterol diet, and weight control.  You are otherwise up to date with prevention measures today.  Please keep your appointments with your specialists as you may have planned  Please go to the LAB in the Basement (turn left off the elevator) for the tests to be done today  You will be contacted by phone if any changes need to be made immediately.  Otherwise, you will receive a letter about your results with an explanation, but please check with MyChart first.  Please remember to sign up for MyChart if you have not done so, as this will be important to you in the future with finding out test results, communicating by private email, and scheduling acute appointments online when needed.  You are given the parking application today  Please return in 1 year for your yearly visit, or sooner if needed, with Lab testing done 3-5 days before

## 2017-01-13 NOTE — Progress Notes (Signed)
Subjective:    Patient ID: [name], male    DOB: [birth year], 56 y.o.   MRN: [id number]  HPI  Here for wellness and f/u;  Overall doing ok;  Pt denies Chest pain, worsening SOB, DOE, wheezing, orthopnea, PND, worsening LE edema, palpitations, dizziness or syncope.  Pt denies neurological change such as new headache, facial or extremity weakness.  Pt denies polydipsia, polyuria, or low sugar symptoms. Pt states overall good compliance with treatment and medications, good tolerability, and has been trying to follow appropriate diet.  Pt denies worsening depressive symptoms, suicidal ideation or panic. No fever, night sweats, wt loss, loss of appetite, or other constitutional symptoms.  Pt states good ability with ADL's, has low fall risk, home safety reviewed and adequate, no other significant changes in hearing or vision, and not active with exercise  Now approved for SSI disability for back pain (backdated to sept 2015 Asks for parking application Past Medical History:  Diagnosis Date  . Allergy   . Anxiety   . Arthritis    Back  . Chronic back pain   . Colon polyps   . Depression   . GERD (gastroesophageal reflux disease)    every now and then  . Hyperlipidemia 09/26/2015  . Lumbar surgical wound fluid collection    lumbar wound dehiscence   Past Surgical History:  Procedure Laterality Date  . BACK SURGERY    . COLONOSCOPY    . COLONOSCOPY W/ BIOPSIES AND POLYPECTOMY    . EYE SURGERY    . KNEE ARTHROSCOPY WITH MEDIAL MENISECTOMY Left 01/23/2016   Procedure: KNEE ARTHROSCOPY WITH MEDIAL MENISECTOMY;  Surgeon: Melrose Nakayama, MD;  Location: [facility];  Service: Orthopedics;  Laterality: Left;  . LUMBAR WOUND DEBRIDEMENT N/A 03/21/2014   Procedure: LUMBAR WOUND DEBRIDEMENT;  Surgeon: Eustace Moore, MD;  Location: [facility];  Service: Neurosurgery;  Laterality: N/A;  . MAXIMUM ACCESS (MAS)POSTERIOR LUMBAR INTERBODY FUSION (PLIF) 1 LEVEL N/A 02/14/2014   Procedure: FOR MAXIMUM ACCESS SURGERY POSTERIOR LUMBAR INTERBODY FUSION LUMBAR TWO-THREE;  Surgeon: Eustace Moore, MD;  Location: [facility];  Service: Neurosurgery;  Laterality: N/A;  . NECK SURGERY    . SHOULDER SURGERY    . SPINE SURGERY    . TONSILLECTOMY      reports that he has been smoking Cigarettes.  He has a 22.00 pack-year smoking history. His smokeless tobacco use includes Snuff. He reports that he drinks about 14.4 oz of alcohol per week . He reports that he does not use drugs. family history includes Aneurysm in his maternal grandfather; Aneurysm (age of onset: 58) in his maternal aunt; Cancer (age of onset: 110) in his paternal uncle; Cancer (age of onset: 13) in his father; Cancer (age of onset: 48) in his paternal uncle; Cancer (age of onset: 9) in his paternal grandfather; Colon cancer in his maternal grandfather; Colon cancer (age of onset: 24) in his father; Colon polyps in his brother; Depression (age of onset: 26) in his brother; Heart disease in his maternal aunt; Hypertension in his mother; Stomach cancer in his father. Allergies  Allergen Reactions  . Cefuroxime Axetil     REACTION: hives   Current Outpatient Prescriptions on File Prior to Visit  Medication Sig Dispense Refill  . DULoxetine (CYMBALTA) 30 MG capsule Take 1 capsule by mouth daily.  2  . fluticasone (FLONASE) 50 MCG/ACT nasal spray Place 2 sprays into both nostrils daily. 16 g 1  . gabapentin (NEURONTIN) 100  MG capsule Take 1 capsule (100 mg total) by mouth 2 (two) times daily. OFFICE VISIT FOR FUTURE REFILLS 60 capsule 1  . gabapentin (NEURONTIN) 300 MG capsule TAKE ONE CAPSULE BY MOUTH AT BEDTIME 30 capsule 3  . meloxicam (MOBIC) 15 MG tablet Take 15 mg by mouth daily.  3  . morphine (MSIR) 15 MG tablet Take 15 mg by mouth every 6 (six) hours as needed.  0  . Oxycodone HCl 10 MG TABS Take 1-2 tablets (10-20 mg total) by mouth every 4 (four) hours as needed (pain). 30 tablet 0  . tiZANidine (ZANAFLEX) 4  MG capsule Take 1 capsule by mouth 3 (three) times daily as needed.  2   Current Facility-Administered Medications on File Prior to Visit  Medication Dose Route Frequency Provider Last Rate Last Dose  . 0.9 %  sodium chloride infusion  500 mL Intravenous Continuous Armbruster, Carlota Raspberry, MD       Review of Systems Constitutional: Negative for other unusual diaphoresis, sweats, appetite or weight changes HENT: Negative for other worsening hearing loss, ear pain, facial swelling, mouth sores or neck stiffness.   Eyes: Negative for other worsening pain, redness or other visual disturbance.  Respiratory: Negative for other stridor or swelling Cardiovascular: Negative for other palpitations or other chest pain  Gastrointestinal: Negative for worsening diarrhea or loose stools, blood in stool, distention or other pain Genitourinary: Negative for hematuria, flank pain or other change in urine volume.  Musculoskeletal: Negative for myalgias or other joint swelling.  Skin: Negative for other color change, or other wound or worsening drainage.  Neurological: Negative for other syncope or numbness. Hematological: Negative for other adenopathy or swelling Psychiatric/Behavioral: Negative for hallucinations, other worsening agitation, SI, self-injury, or new decreased concentration All other system neg per pt    Objective:   Physical Exam BP 128/82   Pulse 75   Temp 98.2 F (36.8 C) (Oral)   Ht 5\' 11"  (5.102 m)   Wt 248 lb (112.5 kg)   SpO2 99%   BMI 34.59 kg/m  VS noted,  Constitutional: Pt is oriented to person, place, and time. Appears well-developed and well-nourished, in no significant distress and comfortable Head: Normocephalic and atraumatic  Eyes: Conjunctivae and EOM are normal. Pupils are equal, round, and reactive to light Right Ear: External ear normal without discharge Left Ear: External ear normal without discharge Nose: Nose without discharge or deformity Mouth/Throat:  Oropharynx is without other ulcerations and moist  Neck: Normal range of motion. Neck supple. No JVD present. No tracheal deviation present or significant neck LA or mass Cardiovascular: Normal rate, regular rhythm, normal heart sounds and intact distal pulses.   Pulmonary/Chest: WOB normal and breath sounds without rales or wheezing  Abdominal: Soft. Bowel sounds are normal. NT. No HSM  Musculoskeletal: Normal range of motion. Exhibits no edema Lymphadenopathy: Has no other cervical adenopathy.  Neurological: Pt is alert and oriented to person, place, and time. Pt has normal reflexes. No cranial nerve deficit. Motor grossly intact, Gait intact Skin: Skin is warm and dry. No rash noted or new ulcerations Psychiatric:  Has normal mood and affect. Behavior is normal without agitation No other exam findings Lab Results  Component Value Date   WBC 9.4 01/13/2017   HGB 17.3 (H) 01/13/2017   HCT 51.8 01/13/2017   PLT 256.0 01/13/2017   GLUCOSE 116 (H) 01/13/2017   CHOL 184 01/13/2017   TRIG 130.0 01/13/2017   HDL 59.90 01/13/2017   LDLDIRECT 150.1 03/07/2012  LDLCALC 98 01/13/2017   ALT 21 01/13/2017   AST 19 01/13/2017   NA 139 01/13/2017   K 4.9 01/13/2017   CL 103 01/13/2017   CREATININE 0.80 01/13/2017   BUN 11 01/13/2017   CO2 29 01/13/2017   TSH 1.63 01/13/2017   PSA 0.60 01/13/2017   INR 1.0 08/29/2015   HGBA1C 5.9 01/13/2017       Assessment & Plan:

## 2017-01-15 NOTE — Assessment & Plan Note (Signed)
[place] for handicap parking application signed

## 2017-01-15 NOTE — Assessment & Plan Note (Signed)

## 2017-01-19 ENCOUNTER — Other Ambulatory Visit: Payer: Self-pay | Admitting: Family Medicine

## 2017-03-07 DIAGNOSIS — R030 Elevated blood-pressure reading, without diagnosis of hypertension: Secondary | ICD-10-CM | POA: Diagnosis not present

## 2017-03-07 DIAGNOSIS — M542 Cervicalgia: Secondary | ICD-10-CM | POA: Diagnosis not present

## 2017-03-07 DIAGNOSIS — Z6834 Body mass index (BMI) 34.0-34.9, adult: Secondary | ICD-10-CM | POA: Diagnosis not present

## 2017-03-07 DIAGNOSIS — M5416 Radiculopathy, lumbar region: Secondary | ICD-10-CM | POA: Diagnosis not present

## 2017-03-07 DIAGNOSIS — M533 Sacrococcygeal disorders, not elsewhere classified: Secondary | ICD-10-CM | POA: Diagnosis not present

## 2017-04-21 ENCOUNTER — Encounter: Payer: Self-pay | Admitting: Family Medicine

## 2017-04-21 ENCOUNTER — Ambulatory Visit (INDEPENDENT_AMBULATORY_CARE_PROVIDER_SITE_OTHER): Payer: Medicare Other | Admitting: Family Medicine

## 2017-04-21 VITALS — BP 136/74 | HR 68 | Temp 98.4°F | Ht 71.0 in | Wt 247.0 lb

## 2017-04-21 DIAGNOSIS — J069 Acute upper respiratory infection, unspecified: Secondary | ICD-10-CM | POA: Diagnosis not present

## 2017-04-21 DIAGNOSIS — M545 Low back pain: Secondary | ICD-10-CM | POA: Diagnosis not present

## 2017-04-21 DIAGNOSIS — Z981 Arthrodesis status: Secondary | ICD-10-CM | POA: Diagnosis not present

## 2017-04-21 DIAGNOSIS — G8929 Other chronic pain: Secondary | ICD-10-CM | POA: Diagnosis not present

## 2017-04-21 MED ORDER — GABAPENTIN 300 MG PO CAPS: 300.0000 mg | ORAL_CAPSULE | Freq: Three times a day (TID) | ORAL | 3 refills | Status: DC

## 2017-04-21 MED ORDER — PREDNISONE 5 MG PO TABS: ORAL_TABLET | ORAL | 0 refills | Status: DC

## 2017-04-21 NOTE — Assessment & Plan Note (Signed)
Having his acute on chronic pain. This is his usual pain.  - gabapentin 300 mg tid  - could consider elavil

## 2017-04-21 NOTE — Patient Instructions (Signed)
Please try things such as zyrtec-D or allegra-D which is an antihistamine and decongestant.   Please try afrin which will help with nasal congestion but use for only three days.   Please also try using a netti pot on a regular occasion.  Honey can help with a sore throat.   Delsym works for cough. An inhaler could help if you notice wheezing.

## 2017-04-21 NOTE — Progress Notes (Signed)
[name] - 56 y.o. male MRN [id number]  Date of birth: Mar 29, 1961  SUBJECTIVE:  Including CC & ROS.  Chief Complaint  Patient presents with  . Cough    [name]  is a 56 y.o. male that is presenting with cough and sinus pressure. Symptoms have been present for two days. He has been coughing up green mucous and has drainage. Patient has taken Mucinex and Nyquil with no improvement. Admits to chills and body aces. Denies any fevers or chills. His wife at the same symptoms. His symptoms seem to be staying the same.  History of cervical fusion as well as lumbar fusion. Has some nerve pain in his lower legs since that time. Has historically taking gabapentin which has improved his pain. He has been out of this medication. He denies any new numbness or tingling or weakness. Denies any new or different pain. This is the normal acute on chronic pain that he is suffering from.  Independent review of the lumbar spine CT from 2016 shows status post hardware L2-3. Significant disc space narrowing at L5-S1.  Independent review of the lumbar spine from 2015 shows L2-3 posterior interbody fusion.   Review of Systems  Constitutional: Negative for fever.  HENT: Positive for rhinorrhea and sinus pressure.   Respiratory: Positive for cough. Negative for shortness of breath.   Cardiovascular: Negative for chest pain.  Musculoskeletal: Positive for back pain. Negative for gait problem.  Neurological: Negative for weakness and numbness.  Hematological: Negative for adenopathy.    HISTORY: Past Medical, Surgical, Social, and Family History Reviewed & Updated per EMR.   Pertinent Historical Findings include:  Past Medical History:  Diagnosis Date  . Allergy   . Anxiety   . Arthritis    Back  . Chronic back pain   . Colon polyps   . Depression   . GERD (gastroesophageal reflux disease)    every now and then  . Hyperlipidemia 09/26/2015  . Lumbar surgical wound fluid collection    lumbar wound dehiscence    Past Surgical History:  Procedure Laterality Date  . BACK SURGERY    . COLONOSCOPY    . COLONOSCOPY W/ BIOPSIES AND POLYPECTOMY    . EYE SURGERY    . KNEE ARTHROSCOPY WITH MEDIAL MENISECTOMY Left 01/23/2016   Procedure: KNEE ARTHROSCOPY WITH MEDIAL MENISECTOMY;  Surgeon: Melrose Nakayama, MD;  Location: [facility];  Service: Orthopedics;  Laterality: Left;  . LUMBAR WOUND DEBRIDEMENT N/A 03/21/2014   Procedure: LUMBAR WOUND DEBRIDEMENT;  Surgeon: Eustace Moore, MD;  Location: [facility];  Service: Neurosurgery;  Laterality: N/A;  . MAXIMUM ACCESS (MAS)POSTERIOR LUMBAR INTERBODY FUSION (PLIF) 1 LEVEL N/A 02/14/2014   Procedure: FOR MAXIMUM ACCESS SURGERY POSTERIOR LUMBAR INTERBODY FUSION LUMBAR TWO-THREE;  Surgeon: Eustace Moore, MD;  Location: [facility];  Service: Neurosurgery;  Laterality: N/A;  . NECK SURGERY    . SHOULDER SURGERY    . SPINE SURGERY    . TONSILLECTOMY      Allergies  Allergen Reactions  . Cefuroxime Axetil     REACTION: hives    Family History  Problem Relation Age of Onset  . Cancer Father 39       colon   . Colon cancer Father 81       pt thinks father had colon cancer  . Stomach cancer Father   . Hypertension Mother   . Colon polyps Brother   . Aneurysm Maternal Aunt 58  brain  . Heart disease Maternal Aunt        d 40  . Cancer Paternal Uncle 90       stomach  . Aneurysm Maternal Grandfather        stomach  . Colon cancer Maternal Grandfather        thinks grandfather had colon cancer  . Cancer Paternal Grandfather 58       stomach  . Depression Brother 20       suicide  . Cancer Paternal Uncle 10       stomach  . Esophageal cancer Neg Hx   . Rectal cancer Neg Hx   . Diabetes Neg Hx      Social History   Socioeconomic History  . Marital status: Married    Spouse name: Not on file  . Number of children: Not on file  . Years of education: Not on file  . Highest education level: Not  on file  Social Needs  . Financial resource strain: Not on file  . Food insecurity - worry: Not on file  . Food insecurity - inability: Not on file  . Transportation needs - medical: Not on file  . Transportation needs - non-medical: Not on file  Occupational History  . Occupation: Engineer, mining: [employer]  Tobacco Use  . Smoking status: Current Every Day Smoker    Packs/day: 0.50    Years: 44.00    Pack years: 22.00    Types: Cigarettes  . Smokeless tobacco: Current User    Types: Snuff  Substance and Sexual Activity  . Alcohol use: Yes    Alcohol/week: 14.4 oz    Types: 24 Standard drinks or equivalent per week    Comment: social beer  . Drug use: No  . Sexual activity: Yes    Partners: Female  Other Topics Concern  . Not on file  Social History Narrative   Exercise--no     PHYSICAL EXAM:  VS: BP 136/74 (BP Location: Left Arm, Patient Position: Sitting)   Pulse 68   Temp 98.4 F (36.9 C) (Oral)   Ht 5\' 11"  (1.803 m)   Wt 247 lb (112 kg)   SpO2 100%   BMI 34.45 kg/m  Physical Exam Gen: NAD, alert, cooperative with exam, well-appearing ENT: normal lips, normal nasal mucosa, tympanic membranes clear and intact bilaterally, normal oropharynx, no cervical lymphadenopathy Eye: normal EOM, normal conjunctiva and lids CV:  no edema, +2 pedal pulses, regular rate and rhythm, S1-S2   Resp: no accessory muscle use, non-labored, clear to auscultation bilaterally, no crackles or wheezes Skin: no rashes, no areas of induration  Neuro: normal tone, normal sensation to touch Psych:  normal insight, alert and oriented MSK:  Back:  Normal IR and ER of the Hips  Normal Hip flexion strength to resistance  Normal knee flexion and extension  Negative SLR b/l  Normal gait Neurovascularly intact    ASSESSMENT & PLAN:   Upper respiratory tract infection Symptoms likely viral. Active smoker. Having some wheezing  - counseled on supportive care  -  prednisone.  - given indications to follow up  Chronic low back pain Having his acute on chronic pain. This is his usual pain.  - gabapentin 300 mg tid  - could consider elavil

## 2017-04-21 NOTE — Assessment & Plan Note (Signed)
Symptoms likely viral. Active smoker. Having some wheezing  - counseled on supportive care  - prednisone.  - given indications to follow up

## 2017-06-07 DIAGNOSIS — M542 Cervicalgia: Secondary | ICD-10-CM | POA: Diagnosis not present

## 2017-06-07 DIAGNOSIS — M5416 Radiculopathy, lumbar region: Secondary | ICD-10-CM | POA: Diagnosis not present

## 2017-06-07 DIAGNOSIS — R030 Elevated blood-pressure reading, without diagnosis of hypertension: Secondary | ICD-10-CM | POA: Diagnosis not present

## 2017-06-07 DIAGNOSIS — M533 Sacrococcygeal disorders, not elsewhere classified: Secondary | ICD-10-CM | POA: Diagnosis not present

## 2017-07-22 NOTE — Progress Notes (Addendum)
Subjective:   [name] is a 57 y.o. male who presents for an Initial Medicare Annual Wellness Visit.  Review of Systems  No ROS.  Medicare Wellness Visit. Additional risk factors are reflected in the social history.  Cardiac Risk Factors include: advanced age (>17men, >57 women);dyslipidemia Sleep patterns: falls asleep easily, feels rested on waking, gets up 2-3 times nightly to void and sleeps 7 hours nightly.   Home Safety/Smoke Alarms: Feels safe in home. Smoke alarms in place.  Living environment; residence and Firearm Safety: 1-story house/ trailer, no firearms. Lives with wife, no needs for DME, good support system Seat Belt Safety/Bike Helmet: Wears seat belt.   PSA-  Lab Results  Component Value Date   PSA 0.60 01/13/2017   PSA 0.45 08/29/2015   PSA 0.46 07/22/2014      Objective:    Today's Vitals   07/25/17 0820  BP: (!) 150/82  Pulse: 76  Resp: 18  SpO2: 98%  Weight: 255 lb (115.7 kg)  Height: 5\' 11"  (1.803 m)  PainSc: 4    Body mass index is 35.57 kg/m.  Advanced Directives 07/25/2017 09/16/2016 01/23/2016 01/21/2016 12/26/2015 12/12/2015 03/20/2014  Does Patient Have a Medical Advance Directive? Yes Yes Yes Yes No Yes Yes  Type of Paramedic of [place];Living will [place];Living will Living will Living will - [place] will  Does patient want to make changes to medical advance directive? - - No - Patient declined - - - No - Patient declined  Copy of [place] in Chart? No - copy requested - No - copy requested - - - No - copy requested    Current Medications (verified) Outpatient Encounter Medications as of 07/25/2017  Medication Sig  . DULoxetine (CYMBALTA) 30 MG capsule Take 1 capsule by mouth daily.  Marland Kitchen gabapentin (NEURONTIN) 300 MG capsule Take 1 capsule (300 mg total) by mouth 3 (three) times daily.  . meloxicam (MOBIC) 15 MG tablet Take 15 mg by mouth  daily.  . Oxycodone HCl 10 MG TABS Take 1-2 tablets (10-20 mg total) by mouth every 4 (four) hours as needed (pain).  Marland Kitchen tiZANidine (ZANAFLEX) 4 MG capsule Take 1 capsule by mouth 3 (three) times daily as needed.  . [DISCONTINUED] predniSONE (DELTASONE) 5 MG tablet Take 6 pills for first day, 5 pills second day, 4 pills third day, 3 pills fourth day, 2 pills the fifth day, and 1 pill sixth day. (Patient not taking: Reported on 07/25/2017)   Facility-Administered Encounter Medications as of 07/25/2017  Medication  . 0.9 %  sodium chloride infusion    Allergies (verified) Cefuroxime axetil   History: Past Medical History:  Diagnosis Date  . Allergy   . Anxiety   . Arthritis    Back  . Chronic back pain   . Colon polyps   . Depression   . GERD (gastroesophageal reflux disease)    every now and then  . Hyperlipidemia 09/26/2015  . Lumbar surgical wound fluid collection    lumbar wound dehiscence   Past Surgical History:  Procedure Laterality Date  . BACK SURGERY    . COLONOSCOPY    . COLONOSCOPY W/ BIOPSIES AND POLYPECTOMY    . EYE SURGERY    . KNEE ARTHROSCOPY WITH MEDIAL MENISECTOMY Left 01/23/2016   Procedure: KNEE ARTHROSCOPY WITH MEDIAL MENISECTOMY;  Surgeon: Melrose Nakayama, MD;  Location: [facility];  Service: Orthopedics;  Laterality: Left;  . LUMBAR  WOUND DEBRIDEMENT N/A 03/21/2014   Procedure: LUMBAR WOUND DEBRIDEMENT;  Surgeon: Eustace Moore, MD;  Location: [facility];  Service: Neurosurgery;  Laterality: N/A;  . MAXIMUM ACCESS (MAS)POSTERIOR LUMBAR INTERBODY FUSION (PLIF) 1 LEVEL N/A 02/14/2014   Procedure: FOR MAXIMUM ACCESS SURGERY POSTERIOR LUMBAR INTERBODY FUSION LUMBAR TWO-THREE;  Surgeon: Eustace Moore, MD;  Location: [facility];  Service: Neurosurgery;  Laterality: N/A;  . NECK SURGERY    . SHOULDER SURGERY    . SPINE SURGERY    . TONSILLECTOMY     Family History  Problem Relation Age of Onset  . Cancer Father 76       colon   . Colon  cancer Father 77       pt thinks father had colon cancer  . Stomach cancer Father   . Hypertension Mother   . Colon polyps Brother   . Aneurysm Maternal Aunt 58       brain  . Heart disease Maternal Aunt        d 65  . Cancer Paternal Uncle 15       stomach  . Aneurysm Maternal Grandfather        stomach  . Colon cancer Maternal Grandfather        thinks grandfather had colon cancer  . Cancer Paternal Grandfather 20       stomach  . Depression Brother 20       suicide  . Cancer Paternal Uncle 59       stomach  . Esophageal cancer Neg Hx   . Rectal cancer Neg Hx   . Diabetes Neg Hx    Social History   Socioeconomic History  . Marital status: Married    Spouse name: Not on file  . Number of children: Not on file  . Years of education: Not on file  . Highest education level: Not on file  Occupational History  . Occupation: Engineer, mining: [employer]  Social Needs  . Financial resource strain: Not hard at all  . Food insecurity:    Worry: Never true    Inability: Never true  . Transportation needs:    Medical: No    Non-medical: No  Tobacco Use  . Smoking status: Current Every Day Smoker    Packs/day: 0.50    Years: 44.00    Pack years: 22.00    Types: Cigarettes  . Smokeless tobacco: Current User    Types: Snuff  Substance and Sexual Activity  . Alcohol use: Yes    Alcohol/week: 14.4 oz    Types: 24 Standard drinks or equivalent per week    Comment: social beer  . Drug use: No  . Sexual activity: Yes    Partners: Female  Lifestyle  . Physical activity:    Days per week: 0 days    Minutes per session: 0 min  . Stress: Only a little  Relationships  . Social connections:    Talks on phone: More than three times a week    Gets together: More than three times a week    Attends religious service: Not on file    Active member of club or organization: Not on file    Attends meetings of clubs or organizations: Not on file    Relationship  status: Not on file  Other Topics Concern  . Not on file  Social History Narrative   Exercise--no   Tobacco Counseling Ready to quit: Not Answered Counseling  given: Not Answered  Activities of Daily Living In your present state of health, do you have any difficulty performing the following activities: 07/25/2017  Hearing? N  Vision? N  Difficulty concentrating or making decisions? N  Walking or climbing stairs? N  Dressing or bathing? N  Doing errands, shopping? N  Preparing Food and eating ? N  Using the Toilet? N  In the past six months, have you accidently leaked urine? N  Do you have problems with loss of bowel control? N  Managing your Medications? N  Managing your Finances? N  Housekeeping or managing your Housekeeping? N  Some recent data might be hidden     Immunizations and Health Maintenance Immunization History  Administered Date(s) Administered  . Influenza Split 03/07/2012  . Influenza,inj,Quad PF,6+ Mos 01/13/2017  . Influenza-Unspecified 05/03/2014  . Pneumococcal Polysaccharide-23 05/03/2008  . Tdap 05/03/2008   There are no preventive care reminders to display for this patient.  Patient Care Team: [name], MD as PCP - General (Internal Medicine)  Indicate any recent Medical Services you may have received from other than [facility] providers in the past year (date may be approximate).    Assessment:   This is a routine wellness examination for [name]. Physical assessment deferred to PCP.   Hearing/Vision screen Hearing Screening Comments: Able to hear conversational tones w/o difficulty. No issues reported.  Passed whisper test Vision Screening Comments: appointment yearly   Dietary issues and exercise activities discussed: Current Exercise Habits: Structured exercise class, Type of exercise: walking, Time (Minutes): 30, Frequency (Times/Week): 5, Weekly Exercise (Minutes/Week): 150, Intensity: Mild, Exercise limited by: orthopedic  condition(s)  Diet (meal preparation, eat out, water intake, caffeinated beverages, dairy products, fruits and vegetables): in general, a "healthy" diet  , well balanced   Reviewed heart healthy diet, encouraged patient to increase daily water intake. Discussed diet for colon health.  Goals    . Patient Stated     Continue to travel with my brother and take family beach trips. Go fishing and take day road trips. Continue to work in my yard and enjoy life.      Depression Screen PHQ 2/9 Scores 07/25/2017 01/13/2017 09/26/2015  PHQ - 2 Score 1 2 0  PHQ- 9 Score 3 5 -    Fall Risk Fall Risk  07/25/2017 01/13/2017 09/26/2015  Falls in the past year? Yes Yes Yes  Number falls in past yr: 2 or more 2 or more 2 or more  Comment - - hipp, groinand knee pain  Injury with Fall? Yes No -  Follow up Falls prevention discussed;Education provided - -   Cognitive Function:       Ad8 score reviewed for issues:  Issues making decisions: no  Less interest in hobbies / activities: no  Repeats questions, stories (family complaining): no  Trouble using ordinary gadgets (microwave, computer, phone):no  Forgets the month or year: no  Mismanaging finances: no  Remembering appts: no  Daily problems with thinking and/or memory: 0 Ad8 score is= 0  Screening Tests Health Maintenance  Topic Date Due  . TETANUS/TDAP  05/03/2018  . COLONOSCOPY  09/17/2019  . INFLUENZA VACCINE  Completed  . Hepatitis C Screening  Completed  . HIV Screening  Completed       Plan:  Continue doing brain stimulating activities (puzzles, reading, adult coloring books, staying active) to keep memory sharp.   Continue to eat heart healthy diet (full of fruits, vegetables, whole grains, lean protein, water--limit salt, fat,  and sugar intake) and increase physical activity as tolerated.  I have personally reviewed and noted the following in the patient's chart:   . Medical and social history . Use of alcohol,  tobacco or illicit drugs  . Current medications and supplements . Functional ability and status . Nutritional status . Physical activity . Advanced directives . List of other physicians . Vitals . Screenings to include cognitive, depression, and falls . Referrals and appointments  In addition, I have reviewed and discussed with patient certain preventive protocols, quality metrics, and best practice recommendations. A written personalized care plan for preventive services as well as general preventive health recommendations were provided to patient.     Michiel Cowboy, RN   07/25/2017   Medical screening examination/treatment/procedure(s) were performed by non-physician practitioner and as supervising physician I was immediately available for consultation/collaboration. I agree with above. Cathlean Cower, MD

## 2017-07-25 ENCOUNTER — Ambulatory Visit (INDEPENDENT_AMBULATORY_CARE_PROVIDER_SITE_OTHER): Payer: Medicare Other | Admitting: *Deleted

## 2017-07-25 VITALS — BP 150/82 | HR 76 | Resp 18 | Ht 71.0 in | Wt 255.0 lb

## 2017-07-25 DIAGNOSIS — Z0000 Encounter for general adult medical examination without abnormal findings: Secondary | ICD-10-CM

## 2017-07-25 NOTE — Patient Instructions (Addendum)
Continue doing brain stimulating activities (puzzles, reading, adult coloring books, staying active) to keep memory sharp.   Continue to eat heart healthy diet (full of fruits, vegetables, whole grains, lean protein, water--limit salt, fat, and sugar intake) and increase physical activity as tolerated.   Mr. [name] , Thank you for taking time to come for your Medicare Wellness Visit. I appreciate your ongoing commitment to your health goals. Please review the following plan we discussed and let me know if I can assist you in the future.   These are the goals we discussed: Goals    . Patient Stated     Continue to travel with my brother and take family beach trips. Go fishing and take day road trips. Continue to work in my yard and enjoy life.       This is a list of the screening recommended for you and due dates:  Health Maintenance  Topic Date Due  . Tetanus Vaccine  05/03/2018  . Colon Cancer Screening  09/17/2019  . Flu Shot  Completed  .  Hepatitis C: One time screening is recommended by Center for Disease Control  (CDC) for  adults born from [birth year] through 1965.   Completed  . HIV Screening  Completed     High-Fiber Diet Fiber, also called dietary fiber, is a type of carbohydrate found in fruits, vegetables, whole grains, and beans. A high-fiber diet can have many health benefits. Your health care provider may recommend a high-fiber diet to help:  Prevent constipation. Fiber can make your bowel movements more regular.  Lower your cholesterol.  Relieve hemorrhoids, uncomplicated diverticulosis, or irritable bowel syndrome.  Prevent overeating as part of a weight-loss plan.  Prevent heart disease, type 2 diabetes, and certain cancers.  What is my plan? The recommended daily intake of fiber includes:  38 grams for men under age 24.  72 grams for men over age 90.  24 grams for women under age 97.  63 grams for women over age 34.  You can get the recommended daily  intake of dietary fiber by eating a variety of fruits, vegetables, grains, and beans. Your health care provider may also recommend a fiber supplement if it is not possible to get enough fiber through your diet. What do I need to know about a high-fiber diet?  Fiber supplements have not been widely studied for their effectiveness, so it is better to get fiber through food sources.  Always check the fiber content on thenutrition facts label of any prepackaged food. Look for foods that contain at least 5 grams of fiber per serving.  Ask your dietitian if you have questions about specific foods that are related to your condition, especially if those foods are not listed in the following section.  Increase your daily fiber consumption gradually. Increasing your intake of dietary fiber too quickly may cause bloating, cramping, or gas.  Drink plenty of water. Water helps you to digest fiber. What foods can I eat? Grains Whole-grain breads. Multigrain cereal. Oats and oatmeal. Brown rice. Barley. Bulgur wheat. [place]. Bran muffins. Popcorn. Rye wafer crackers. Vegetables Sweet potatoes. Spinach. Kale. Artichokes. Cabbage. Broccoli. Green peas. Carrots. Squash. Fruits Berries. Pears. Apples. Oranges. Avocados. Prunes and raisins. Dried figs. Meats and Other Protein Sources [place], kidney, pinto, and soy beans. Split peas. Lentils. Nuts and seeds. Dairy Fiber-fortified yogurt. Beverages Fiber-fortified soy milk. Fiber-fortified orange juice. Other Fiber bars. The items listed above may not be a complete list of recommended foods or beverages. Contact  your dietitian for more options. What foods are not recommended? Grains White bread. Pasta made with refined flour. White rice. Vegetables Fried potatoes. Canned vegetables. Well-cooked vegetables. Fruits Fruit juice. Cooked, strained fruit. Meats and Other Protein Sources Fatty cuts of meat. Fried Sales executive or fried fish. Dairy Milk. Yogurt.  Cream cheese. Sour cream. Beverages Soft drinks. Other Cakes and pastries. Butter and oils. The items listed above may not be a complete list of foods and beverages to avoid. Contact your dietitian for more information. What are some tips for including high-fiber foods in my diet?  Eat a wide variety of high-fiber foods.  Make sure that half of all grains consumed each day are whole grains.  Replace breads and cereals made from refined flour or white flour with whole-grain breads and cereals.  Replace white rice with brown rice, bulgur wheat, or millet.  Start the day with a breakfast that is high in fiber, such as a cereal that contains at least 5 grams of fiber per serving.  Use beans in place of meat in soups, salads, or pasta.  Eat high-fiber snacks, such as berries, raw vegetables, nuts, or popcorn. This information is not intended to replace advice given to you by your health care provider. Make sure you discuss any questions you have with your health care provider. Document Released: 04/19/2005 Document Revised: 09/25/2015 Document Reviewed: 10/02/2013 Elsevier Interactive Patient Education  [employer].

## 2017-07-27 DIAGNOSIS — L821 Other seborrheic keratosis: Secondary | ICD-10-CM | POA: Diagnosis not present

## 2017-07-27 DIAGNOSIS — D2261 Melanocytic nevi of right upper limb, including shoulder: Secondary | ICD-10-CM | POA: Diagnosis not present

## 2017-07-27 DIAGNOSIS — D0462 Carcinoma in situ of skin of left upper limb, including shoulder: Secondary | ICD-10-CM | POA: Diagnosis not present

## 2017-07-27 DIAGNOSIS — D225 Melanocytic nevi of trunk: Secondary | ICD-10-CM | POA: Diagnosis not present

## 2017-07-27 DIAGNOSIS — D2361 Other benign neoplasm of skin of right upper limb, including shoulder: Secondary | ICD-10-CM | POA: Diagnosis not present

## 2017-08-29 DIAGNOSIS — Z85828 Personal history of other malignant neoplasm of skin: Secondary | ICD-10-CM | POA: Diagnosis not present

## 2017-08-29 DIAGNOSIS — Z08 Encounter for follow-up examination after completed treatment for malignant neoplasm: Secondary | ICD-10-CM | POA: Diagnosis not present

## 2017-09-06 DIAGNOSIS — R030 Elevated blood-pressure reading, without diagnosis of hypertension: Secondary | ICD-10-CM | POA: Diagnosis not present

## 2017-09-06 DIAGNOSIS — M5416 Radiculopathy, lumbar region: Secondary | ICD-10-CM | POA: Diagnosis not present

## 2017-09-06 DIAGNOSIS — M542 Cervicalgia: Secondary | ICD-10-CM | POA: Diagnosis not present

## 2017-09-06 DIAGNOSIS — M533 Sacrococcygeal disorders, not elsewhere classified: Secondary | ICD-10-CM | POA: Diagnosis not present

## 2017-09-21 ENCOUNTER — Ambulatory Visit: Payer: Medicare Other | Admitting: Internal Medicine

## 2017-09-21 ENCOUNTER — Encounter: Payer: Self-pay | Admitting: Internal Medicine

## 2017-09-21 ENCOUNTER — Other Ambulatory Visit (INDEPENDENT_AMBULATORY_CARE_PROVIDER_SITE_OTHER): Payer: Medicare Other

## 2017-09-21 VITALS — BP 126/82 | HR 88 | Temp 98.1°F | Ht 71.0 in | Wt 251.0 lb

## 2017-09-21 DIAGNOSIS — Z0000 Encounter for general adult medical examination without abnormal findings: Secondary | ICD-10-CM

## 2017-09-21 DIAGNOSIS — M545 Low back pain: Secondary | ICD-10-CM | POA: Diagnosis not present

## 2017-09-21 DIAGNOSIS — G8929 Other chronic pain: Secondary | ICD-10-CM | POA: Diagnosis not present

## 2017-09-21 DIAGNOSIS — R739 Hyperglycemia, unspecified: Secondary | ICD-10-CM | POA: Diagnosis not present

## 2017-09-21 LAB — BASIC METABOLIC PANEL
BUN: 17 mg/dL (ref 6–23)
CO2: 30 mEq/L (ref 19–32)
Calcium: 9.5 mg/dL (ref 8.4–10.5)
Chloride: 101 mEq/L (ref 96–112)
Creatinine, Ser: 0.9 mg/dL (ref 0.40–1.50)
GFR: 92.84 mL/min (ref >60.00)
Glucose, Bld: 108 mg/dL — ABNORMAL HIGH (ref 70–99)
Potassium: 5.5 mEq/L — ABNORMAL HIGH (ref 3.5–5.1)
Sodium: 137 mEq/L (ref 135–145)

## 2017-09-21 LAB — CBC WITH DIFFERENTIAL/PLATELET
Basophils Absolute: 0.1 10*3/uL (ref 0.0–0.1)
Basophils Relative: 0.7 % (ref 0.0–3.0)
Eosinophils Absolute: 0.1 10*3/uL (ref 0.0–0.7)
Eosinophils Relative: 0.9 % (ref 0.0–5.0)
HCT: 49.3 % (ref 39.0–52.0)
Hemoglobin: 16.6 g/dL (ref 13.0–17.0)
Lymphocytes Relative: 19.1 % (ref 12.0–46.0)
Lymphs Abs: 2.5 10*3/uL (ref 0.7–4.0)
MCHC: 33.8 g/dL (ref 30.0–36.0)
MCV: 95 fl (ref 78.0–100.0)
Monocytes Absolute: 1.3 10*3/uL — ABNORMAL HIGH (ref 0.1–1.0)
Monocytes Relative: 9.7 % (ref 3.0–12.0)
Neutro Abs: 9.1 10*3/uL — ABNORMAL HIGH (ref 1.4–7.7)
Neutrophils Relative %: 69.6 % (ref 43.0–77.0)
Platelets: 288 10*3/uL (ref 150.0–400.0)
RBC: 5.19 Mil/uL (ref 4.22–5.81)
RDW: 13.6 % (ref 11.5–15.5)
WBC: 13.1 10*3/uL — ABNORMAL HIGH (ref 4.0–10.5)

## 2017-09-21 LAB — URINALYSIS, ROUTINE W REFLEX MICROSCOPIC
Bilirubin Urine: NEGATIVE
Ketones, ur: NEGATIVE
Leukocytes, UA: NEGATIVE
Nitrite: NEGATIVE
Specific Gravity, Urine: >=1.03 — AB (ref 1.000–1.030)
Total Protein, Urine: NEGATIVE
Urine Glucose: NEGATIVE
Urobilinogen, UA: 0.2 (ref 0.0–1.0)
pH: 6 (ref 5.0–8.0)

## 2017-09-21 LAB — HEPATIC FUNCTION PANEL
ALT: 24 U/L (ref 0–53)
AST: 15 U/L (ref 0–37)
Albumin: 4.4 g/dL (ref 3.5–5.2)
Alkaline Phosphatase: 76 U/L (ref 39–117)
Bilirubin, Direct: 0.2 mg/dL (ref 0.0–0.3)
Total Bilirubin: 0.8 mg/dL (ref 0.2–1.2)
Total Protein: 6.7 g/dL (ref 6.0–8.3)

## 2017-09-21 LAB — HEMOGLOBIN A1C: Hgb A1c MFr Bld: 5.9 % (ref 4.6–6.5)

## 2017-09-21 LAB — PSA: PSA: 0.43 ng/mL (ref 0.10–4.00)

## 2017-09-21 LAB — LIPID PANEL
Cholesterol: 180 mg/dL (ref 0–200)
HDL: 56.3 mg/dL (ref >39.00)
LDL Cholesterol: 109 mg/dL — ABNORMAL HIGH (ref 0–99)
NonHDL: 123.61
Total CHOL/HDL Ratio: 3
Triglycerides: 75 mg/dL (ref 0.0–149.0)
VLDL: 15 mg/dL (ref 0.0–40.0)

## 2017-09-21 LAB — TSH: TSH: 2.4 u[IU]/mL (ref 0.35–4.50)

## 2017-09-21 NOTE — Assessment & Plan Note (Signed)

## 2017-09-21 NOTE — Assessment & Plan Note (Signed)
stable overall by history and exam, recent data reviewed with pt, and pt to continue medical treatment as before,  to f/u any worsening symptoms or concerns Lab Results  Component Value Date   HGBA1C 5.9 01/13/2017  for f/u lab today

## 2017-09-21 NOTE — Assessment & Plan Note (Signed)
To cont with pain management every 3 mo

## 2017-09-21 NOTE — Progress Notes (Signed)
Subjective:    Patient ID: [name], male    DOB: 10/09/1960, 57 y.o.   MRN: [id number]  HPI  Here for wellness and f/u;  Overall doing ok;  Pt denies Chest pain, worsening SOB, DOE, wheezing, orthopnea, PND, worsening LE edema, palpitations, dizziness or syncope.  Pt denies neurological change such as new headache, facial or extremity weakness.  Pt denies polydipsia, polyuria, or low sugar symptoms. Pt states overall good compliance with treatment and medications, good tolerability, and has been trying to follow appropriate diet.  Pt denies worsening depressive symptoms, suicidal ideation or panic. No fever, night sweats, wt loss, loss of appetite, or other constitutional symptoms.  Pt states good ability with ADL's, has low fall risk, home safety reviewed and adequate, no other significant changes in hearing or vision, and only occasionally active with exercise.  Lost wt with better diet.   Wt Readings from Last 3 Encounters:  09/21/17 251 lb (113.9 kg)  07/25/17 255 lb (115.7 kg)  04/21/17 247 lb (112 kg)  Cont's disabled due to back pain (and other chronic pain) and cont's to see pain management.  Last worked about 57 yo.  No new complaints or interval hx Past Medical History:  Diagnosis Date  . Allergy   . Anxiety   . Arthritis    Back  . Chronic back pain   . Colon polyps   . Depression   . GERD (gastroesophageal reflux disease)    every now and then  . Hyperlipidemia 09/26/2015  . Lumbar surgical wound fluid collection    lumbar wound dehiscence   Past Surgical History:  Procedure Laterality Date  . BACK SURGERY    . COLONOSCOPY    . COLONOSCOPY W/ BIOPSIES AND POLYPECTOMY    . EYE SURGERY    . KNEE ARTHROSCOPY WITH MEDIAL MENISECTOMY Left 01/23/2016   Procedure: KNEE ARTHROSCOPY WITH MEDIAL MENISECTOMY;  Surgeon: Melrose Nakayama, MD;  Location: [facility];  Service: Orthopedics;  Laterality: Left;  . LUMBAR WOUND DEBRIDEMENT N/A 03/21/2014   Procedure: LUMBAR WOUND DEBRIDEMENT;  Surgeon: Eustace Moore, MD;  Location: [place] [facility];  Service: Neurosurgery;  Laterality: N/A;  . MAXIMUM ACCESS (MAS)POSTERIOR LUMBAR INTERBODY FUSION (PLIF) 1 LEVEL N/A 02/14/2014   Procedure: FOR MAXIMUM ACCESS SURGERY POSTERIOR LUMBAR INTERBODY FUSION LUMBAR TWO-THREE;  Surgeon: Eustace Moore, MD;  Location: [place] [facility];  Service: Neurosurgery;  Laterality: N/A;  . NECK SURGERY    . SHOULDER SURGERY    . SPINE SURGERY    . TONSILLECTOMY      reports that he has been smoking cigarettes.  He has a 22.00 pack-year smoking history. His smokeless tobacco use includes snuff. He reports that he drinks about 14.4 oz of alcohol per week. He reports that he does not use drugs. family history includes Aneurysm in his maternal grandfather; Aneurysm (age of onset: 48) in his maternal aunt; Cancer (age of onset: 72) in his paternal uncle; Cancer (age of onset: 11) in his father; Cancer (age of onset: 5) in his paternal uncle; Cancer (age of onset: 32) in his paternal grandfather; Colon cancer in his maternal grandfather; Colon cancer (age of onset: 54) in his father; Colon polyps in his brother; Depression (age of onset: 25) in his brother; Heart disease in his maternal aunt; Hypertension in his mother; Stomach cancer in his father. Allergies  Allergen Reactions  . Cefuroxime Axetil     REACTION: hives   Current Outpatient Medications on File Prior  to Visit  Medication Sig Dispense Refill  . DULoxetine (CYMBALTA) 30 MG capsule Take 1 capsule by mouth daily.  2  . gabapentin (NEURONTIN) 300 MG capsule Take 1 capsule (300 mg total) by mouth 3 (three) times daily. 90 capsule 3  . meloxicam (MOBIC) 15 MG tablet Take 15 mg by mouth daily.  3  . Oxycodone HCl 10 MG TABS Take 1-2 tablets (10-20 mg total) by mouth every 4 (four) hours as needed (pain). 30 tablet 0  . tiZANidine (ZANAFLEX) 4 MG capsule Take 1 capsule by mouth 3 (three) times daily as needed.  2   No  current facility-administered medications on file prior to visit.    Review of Systems Constitutional: Negative for other unusual diaphoresis, sweats, appetite or weight changes HENT: Negative for other worsening hearing loss, ear pain, facial swelling, mouth sores or neck stiffness.   Eyes: Negative for other worsening pain, redness or other visual disturbance.  Respiratory: Negative for other stridor or swelling Cardiovascular: Negative for other palpitations or other chest pain  Gastrointestinal: Negative for worsening diarrhea or loose stools, blood in stool, distention or other pain Genitourinary: Negative for hematuria, flank pain or other change in urine volume.  Musculoskeletal: Negative for myalgias or other joint swelling.  Skin: Negative for other color change, or other wound or worsening drainage.  Neurological: Negative for other syncope or numbness. Hematological: Negative for other adenopathy or swelling Psychiatric/Behavioral: Negative for hallucinations, other worsening agitation, SI, self-injury, or new decreased concentration All other system neg per pt    Objective:   Physical Exam BP 126/82   Pulse 88   Temp 98.1 F (36.7 C) (Oral)   Ht 5\' 11"  (1.803 m)   Wt 251 lb (113.9 kg)   SpO2 98%   BMI 35.01 kg/m  VS noted,  Constitutional: Pt is oriented to person, place, and time. Appears well-developed and well-nourished, in no significant distress and comfortable Head: Normocephalic and atraumatic  Eyes: Conjunctivae and EOM are normal. Pupils are equal, round, and reactive to light Right Ear: External ear normal without discharge Left Ear: External ear normal without discharge Nose: Nose without discharge or deformity Mouth/Throat: Oropharynx is without other ulcerations and moist  Neck: Normal range of motion. Neck supple. No JVD present. No tracheal deviation present or significant neck LA or mass Cardiovascular: Normal rate, regular rhythm, normal heart sounds  and intact distal pulses.   Pulmonary/Chest: WOB normal and breath sounds without rales or wheezing  Abdominal: Soft. Bowel sounds are normal. NT. No HSM  Musculoskeletal: Normal range of motion. Exhibits no edema Lymphadenopathy: Has no other cervical adenopathy.  Neurological: Pt is alert and oriented to person, place, and time. Pt has normal reflexes. No cranial nerve deficit. Motor grossly intact, Gait intact Skin: Skin is warm and dry. No rash noted or new ulcerations Psychiatric:  Has normal mood and affect. Behavior is normal without agitation No other exam findings Lab Results  Component Value Date   WBC 9.4 01/13/2017   HGB 17.3 (H) 01/13/2017   HCT 51.8 01/13/2017   PLT 256.0 01/13/2017   GLUCOSE 116 (H) 01/13/2017   CHOL 184 01/13/2017   TRIG 130.0 01/13/2017   HDL 59.90 01/13/2017   LDLDIRECT 150.1 03/07/2012   LDLCALC 98 01/13/2017   ALT 21 01/13/2017   AST 19 01/13/2017   NA 139 01/13/2017   K 4.9 01/13/2017   CL 103 01/13/2017   CREATININE 0.80 01/13/2017   BUN 11 01/13/2017   CO2 29 01/13/2017  TSH 1.63 01/13/2017   PSA 0.60 01/13/2017   INR 1.0 08/29/2015   HGBA1C 5.9 01/13/2017       Assessment & Plan:

## 2017-09-21 NOTE — Patient Instructions (Signed)

## 2017-11-30 DIAGNOSIS — R030 Elevated blood-pressure reading, without diagnosis of hypertension: Secondary | ICD-10-CM | POA: Diagnosis not present

## 2017-11-30 DIAGNOSIS — M542 Cervicalgia: Secondary | ICD-10-CM | POA: Diagnosis not present

## 2017-11-30 DIAGNOSIS — M5416 Radiculopathy, lumbar region: Secondary | ICD-10-CM | POA: Diagnosis not present

## 2018-02-03 DIAGNOSIS — Z23 Encounter for immunization: Secondary | ICD-10-CM | POA: Diagnosis not present

## 2018-02-20 DIAGNOSIS — M5416 Radiculopathy, lumbar region: Secondary | ICD-10-CM | POA: Diagnosis not present

## 2018-02-20 DIAGNOSIS — M542 Cervicalgia: Secondary | ICD-10-CM | POA: Diagnosis not present

## 2018-02-20 DIAGNOSIS — R030 Elevated blood-pressure reading, without diagnosis of hypertension: Secondary | ICD-10-CM | POA: Diagnosis not present

## 2018-05-03 DIAGNOSIS — J189 Pneumonia, unspecified organism: Secondary | ICD-10-CM

## 2018-05-03 HISTORY — DX: Pneumonia, unspecified organism: J18.9

## 2018-05-11 DIAGNOSIS — M542 Cervicalgia: Secondary | ICD-10-CM | POA: Diagnosis not present

## 2018-05-11 DIAGNOSIS — M5416 Radiculopathy, lumbar region: Secondary | ICD-10-CM | POA: Diagnosis not present

## 2018-05-15 DIAGNOSIS — R200 Anesthesia of skin: Secondary | ICD-10-CM | POA: Diagnosis not present

## 2018-05-15 DIAGNOSIS — M5412 Radiculopathy, cervical region: Secondary | ICD-10-CM | POA: Diagnosis not present

## 2018-05-23 ENCOUNTER — Ambulatory Visit (INDEPENDENT_AMBULATORY_CARE_PROVIDER_SITE_OTHER): Payer: Medicare Other | Admitting: Internal Medicine

## 2018-05-23 ENCOUNTER — Encounter: Payer: Self-pay | Admitting: Internal Medicine

## 2018-05-23 VITALS — BP 146/92 | HR 94 | Temp 98.3°F | Ht 71.0 in | Wt 259.0 lb

## 2018-05-23 DIAGNOSIS — G8929 Other chronic pain: Secondary | ICD-10-CM | POA: Diagnosis not present

## 2018-05-23 DIAGNOSIS — H6692 Otitis media, unspecified, left ear: Secondary | ICD-10-CM | POA: Diagnosis not present

## 2018-05-23 DIAGNOSIS — M545 Low back pain: Secondary | ICD-10-CM | POA: Diagnosis not present

## 2018-05-23 DIAGNOSIS — Z23 Encounter for immunization: Secondary | ICD-10-CM

## 2018-05-23 DIAGNOSIS — R739 Hyperglycemia, unspecified: Secondary | ICD-10-CM | POA: Diagnosis not present

## 2018-05-23 MED ORDER — TRIAMCINOLONE ACETONIDE 55 MCG/ACT NA AERO: 2.0000 | INHALATION_SPRAY | Freq: Every day | NASAL | 12 refills | Status: DC

## 2018-05-23 MED ORDER — AZITHROMYCIN 250 MG PO TABS: ORAL_TABLET | ORAL | 1 refills | Status: DC

## 2018-05-23 NOTE — Patient Instructions (Addendum)
Please take all new medication as prescribed - the antibiotic, and nasacort  You can also take Mucinex (or it's generic off brand) for congestion, and tylenol as needed for pain.  Please continue all other medications as before, and refills have been done if requested.  Please have the pharmacy call with any other refills you may need.  Please continue your efforts at being more active, low cholesterol diet, and weight control  Please keep your appointments with your specialists as you may have planned

## 2018-05-23 NOTE — Assessment & Plan Note (Signed)
stable overall by history and exam, recent data reviewed with pt, and pt to continue medical treatment as before,  to f/u any worsening symptoms or concerns  

## 2018-05-23 NOTE — Assessment & Plan Note (Signed)
Mild to mod, for antibx course,  to f/u any worsening symptoms or concerns 

## 2018-05-23 NOTE — Progress Notes (Signed)
Subjective:    Patient ID: [name], male    DOB: [birth year], 58 y.o.   MRN: [id number]  HPI   Here with 2-3 days acute onset feverish warm, left ear pain, pressure, headache, general weakness and malaise, with mild ST and cough and occasional dizziness, but pt denies chest pain, wheezing, increased sob or doe, orthopnea, PND, increased LE swelling, palpitations, or syncope.  Pt denies new neurological symptoms such as new headache, or facial or extremity weakness or numbness   Pt denies polydipsia, polyuria,  Pt continues to have recurring LBP without change in severity, bowel or bladder change, fever, wt loss,  worsening LE pain/numbness/weakness, gait change or falls. Past Medical History:  Diagnosis Date  . Allergy   . Anxiety   . Arthritis    Back  . Chronic back pain   . Colon polyps   . Depression   . GERD (gastroesophageal reflux disease)    every now and then  . Hyperlipidemia 09/26/2015  . Lumbar surgical wound fluid collection    lumbar wound dehiscence   Past Surgical History:  Procedure Laterality Date  . BACK SURGERY    . COLONOSCOPY    . COLONOSCOPY W/ BIOPSIES AND POLYPECTOMY    . EYE SURGERY    . KNEE ARTHROSCOPY WITH MEDIAL MENISECTOMY Left 01/23/2016   Procedure: KNEE ARTHROSCOPY WITH MEDIAL MENISECTOMY;  Surgeon: Melrose Nakayama, MD;  Location: [facility];  Service: Orthopedics;  Laterality: Left;  . LUMBAR WOUND DEBRIDEMENT N/A 03/21/2014   Procedure: LUMBAR WOUND DEBRIDEMENT;  Surgeon: Eustace Moore, MD;  Location: [facility];  Service: Neurosurgery;  Laterality: N/A;  . MAXIMUM ACCESS (MAS)POSTERIOR LUMBAR INTERBODY FUSION (PLIF) 1 LEVEL N/A 02/14/2014   Procedure: FOR MAXIMUM ACCESS SURGERY POSTERIOR LUMBAR INTERBODY FUSION LUMBAR TWO-THREE;  Surgeon: Eustace Moore, MD;  Location: [facility];  Service: Neurosurgery;  Laterality: N/A;  . NECK SURGERY    . SHOULDER SURGERY    . SPINE SURGERY    . TONSILLECTOMY      reports that he  has been smoking cigarettes. He has a 22.00 pack-year smoking history. His smokeless tobacco use includes snuff. He reports current alcohol use of about 24.0 standard drinks of alcohol per week. He reports that he does not use drugs. family history includes Aneurysm in his maternal grandfather; Aneurysm (age of onset: 34) in his maternal aunt; Cancer (age of onset: 58) in his paternal uncle; Cancer (age of onset: 46) in his father; Cancer (age of onset: 62) in his paternal uncle; Cancer (age of onset: 31) in his paternal grandfather; Colon cancer in his maternal grandfather; Colon cancer (age of onset: 34) in his father; Colon polyps in his brother; Depression (age of onset: 3) in his brother; Heart disease in his maternal aunt; Hypertension in his mother; Stomach cancer in his father. Allergies  Allergen Reactions  . Cefuroxime Axetil     REACTION: hives   Current Outpatient Medications on File Prior to Visit  Medication Sig Dispense Refill  . DULoxetine (CYMBALTA) 30 MG capsule Take 1 capsule by mouth daily.  2  . gabapentin (NEURONTIN) 300 MG capsule Take 1 capsule (300 mg total) by mouth 3 (three) times daily. 90 capsule 3  . meloxicam (MOBIC) 15 MG tablet Take 15 mg by mouth daily.  3  . Oxycodone HCl 10 MG TABS Take 1-2 tablets (10-20 mg total) by mouth every 4 (four) hours as needed (pain). 30 tablet 0  . tiZANidine (ZANAFLEX)  4 MG capsule Take 1 capsule by mouth 3 (three) times daily as needed.  2   No current facility-administered medications on file prior to visit.    Review of Systems  Constitutional: Negative for other unusual diaphoresis or sweats HENT: Negative for ear discharge or swelling Eyes: Negative for other worsening visual disturbances Respiratory: Negative for stridor or other swelling  Gastrointestinal: Negative for worsening distension or other blood Genitourinary: Negative for retention or other urinary change Musculoskeletal: Negative for other MSK pain or  swelling Skin: Negative for color change or other new lesions Neurological: Negative for worsening tremors and other numbness  Psychiatric/Behavioral: Negative for worsening agitation or other fatigue All other system neg per pt    Objective:   Physical Exam BP (!) 146/92   Pulse 94   Temp 98.3 F (36.8 C) (Oral)   Ht 5\' 11"  (1.803 m)   Wt 259 lb (117.5 kg)   SpO2 100%   BMI 36.12 kg/m  VS noted, mild ill Constitutional: Pt appears in NAD HENT: Head: NCAT.  Right Ear: External ear normal.  Left Ear: External ear normal.  Eyes: . Pupils are equal, round, and reactive to light. Conjunctivae and EOM are normal Right tm's with mild erythema but left with severe erythema and effusion .  Max sinus areas non tender.  Pharynx with mild erythema, no exudate Nose: without d/c or deformity Neck: Neck supple. Gross normal ROM Cardiovascular: Normal rate and regular rhythm.   Pulmonary/Chest: Effort normal and breath sounds without rales or wheezing.  Neurological: Pt is alert. At baseline orientation, motor grossly intact Skin: Skin is warm. No rashes, other new lesions, no LE edema Psychiatric: Pt behavior is normal without agitation  No other exam findings Lab Results  Component Value Date   WBC 13.1 (H) 09/21/2017   HGB 16.6 09/21/2017   HCT 49.3 09/21/2017   PLT 288.0 09/21/2017   GLUCOSE 108 (H) 09/21/2017   CHOL 180 09/21/2017   TRIG 75.0 09/21/2017   HDL 56.30 09/21/2017   LDLDIRECT 150.1 03/07/2012   LDLCALC 109 (H) 09/21/2017   ALT 24 09/21/2017   AST 15 09/21/2017   NA 137 09/21/2017   K 5.5 (H) 09/21/2017   CL 101 09/21/2017   CREATININE 0.90 09/21/2017   BUN 17 09/21/2017   CO2 30 09/21/2017   TSH 2.40 09/21/2017   PSA 0.43 09/21/2017   INR 1.0 08/29/2015   HGBA1C 5.9 09/21/2017       Assessment & Plan:

## 2018-06-19 DIAGNOSIS — M5412 Radiculopathy, cervical region: Secondary | ICD-10-CM | POA: Diagnosis not present

## 2018-06-24 DIAGNOSIS — M542 Cervicalgia: Secondary | ICD-10-CM | POA: Diagnosis not present

## 2018-06-30 DIAGNOSIS — M5412 Radiculopathy, cervical region: Secondary | ICD-10-CM | POA: Diagnosis not present

## 2018-07-13 ENCOUNTER — Ambulatory Visit
Admission: EM | Admit: 2018-07-13 | Discharge: 2018-07-13 | Disposition: A | Discharge status: Home / Self Care | Payer: Medicare Other | Attending: Family Medicine | Admitting: Family Medicine

## 2018-07-13 ENCOUNTER — Ambulatory Visit: Payer: Self-pay | Admitting: Internal Medicine

## 2018-07-13 DIAGNOSIS — J209 Acute bronchitis, unspecified: Secondary | ICD-10-CM

## 2018-07-13 MED ORDER — PREDNISONE 50 MG PO TABS: 50.0000 mg | ORAL_TABLET | Freq: Every day | ORAL | 0 refills | Status: DC

## 2018-07-13 MED ORDER — FLUTICASONE PROPIONATE 50 MCG/ACT NA SUSP: 1.0000 | Freq: Every day | NASAL | 0 refills | Status: DC

## 2018-07-13 MED ORDER — BENZONATATE 200 MG PO CAPS
200.0000 mg | ORAL_CAPSULE | Freq: Three times a day (TID) | ORAL | 0 refills | Status: AC | PRN
Start: 2018-07-13 — End: 2018-07-20

## 2018-07-13 MED ORDER — IPRATROPIUM-ALBUTEROL 0.5-2.5 (3) MG/3ML IN SOLN
3.0000 mL | Freq: Once | RESPIRATORY_TRACT | Status: AC
  Administered 2018-07-13: 3 mL via RESPIRATORY_TRACT

## 2018-07-13 MED ORDER — IBUPROFEN 600 MG PO TABS: 600.0000 mg | ORAL_TABLET | Freq: Three times a day (TID) | ORAL | 0 refills | Status: DC | PRN

## 2018-07-13 MED ORDER — IBUPROFEN 600 MG PO TABS: 600.0000 mg | ORAL_TABLET | Freq: Four times a day (QID) | ORAL | 0 refills | Status: DC | PRN

## 2018-07-13 MED ORDER — ALBUTEROL SULFATE HFA 108 (90 BASE) MCG/ACT IN AERS: 1.0000 | INHALATION_SPRAY | Freq: Four times a day (QID) | RESPIRATORY_TRACT | 0 refills | Status: DC | PRN

## 2018-07-13 NOTE — ED Triage Notes (Signed)
Pt c/o cough, headache, fever and chest congestion since Tuesday

## 2018-07-13 NOTE — ED Provider Notes (Signed)
[name]    CSN: [id number] Arrival date & time: 07/13/18  1007     History   Chief Complaint Chief Complaint  Patient presents with  . Influenza    HPI [name] is a 58 y.o. male history of tobacco use, hyperlipidemia presenting today for evaluation of a cough.  Patient states that he has had a cough with slight chest discomfort with the cough.  He feels as if someone is punching him in his chest.  He only has mild symptoms at rest.  Has also had congestion and a slight sore throat.  Has had body aches and subjective fevers.  He has tried taking Robitussin, Tylenol as well as Flonase.  Symptoms began 2 days ago.  Patient admits to proximately half pack of cigarettes a day as well as dip.  Denies any recent travel, denies any known exposure to COVID-19.   HPI  Past Medical History:  Diagnosis Date  . Allergy   . Anxiety   . Arthritis    Back  . Chronic back pain   . Colon polyps   . Depression   . GERD (gastroesophageal reflux disease)    every now and then  . Hyperlipidemia 09/26/2015  . Lumbar surgical wound fluid collection    lumbar wound dehiscence    Patient Active Problem List   Diagnosis Date Noted  . Left otitis media 05/23/2018  . Upper respiratory tract infection 04/21/2017  . Low testosterone 12/10/2015  . Polymyalgia ([place]) 11/11/2015  . Chronic low back pain 10/30/2015  . Easy bruising 10/30/2015  . Bursitis of right shoulder 09/30/2015  . Hyperglycemia 09/26/2015  . Hyperlipidemia 09/26/2015  . History of colonic polyps 09/26/2015  . Acute medial meniscal tear 09/09/2015  . Multiple bruises 08/28/2015  . Left knee pain 08/28/2015  . S/P lumbar spinal fusion 02/14/2014  . Obesity (BMI 30-39.9) 07/12/2013  . OA (osteoarthritis) 03/07/2012  . Fatigue 03/07/2012  . Preventative health care 09/03/2010  . History of tobacco use 09/03/2010    Past Surgical History:  Procedure Laterality Date  . BACK SURGERY    .  COLONOSCOPY    . COLONOSCOPY W/ BIOPSIES AND POLYPECTOMY    . EYE SURGERY    . KNEE ARTHROSCOPY WITH MEDIAL MENISECTOMY Left 01/23/2016   Procedure: KNEE ARTHROSCOPY WITH MEDIAL MENISECTOMY;  Surgeon: Melrose Nakayama, MD;  Location: [facility];  Service: Orthopedics;  Laterality: Left;  . LUMBAR WOUND DEBRIDEMENT N/A 03/21/2014   Procedure: LUMBAR WOUND DEBRIDEMENT;  Surgeon: Eustace Moore, MD;  Location: [facility];  Service: Neurosurgery;  Laterality: N/A;  . MAXIMUM ACCESS (MAS)POSTERIOR LUMBAR INTERBODY FUSION (PLIF) 1 LEVEL N/A 02/14/2014   Procedure: FOR MAXIMUM ACCESS SURGERY POSTERIOR LUMBAR INTERBODY FUSION LUMBAR TWO-THREE;  Surgeon: Eustace Moore, MD;  Location: [facility];  Service: Neurosurgery;  Laterality: N/A;  . NECK SURGERY    . SHOULDER SURGERY    . SPINE SURGERY    . TONSILLECTOMY         Home Medications    Prior to Admission medications   Medication Sig Start Date End Date Taking? Authorizing Provider  albuterol (PROVENTIL HFA;VENTOLIN HFA) 108 (90 Base) MCG/ACT inhaler Inhale 1-2 puffs into the lungs every 6 (six) hours as needed for wheezing or shortness of breath. 07/13/18   Keyia Moretto C, PA-C  benzonatate (TESSALON) 200 MG capsule Take 1 capsule (200 mg total) by mouth 3 (three) times daily as needed for up to 7 days for  cough. 07/13/18 07/20/18  Lamont Glasscock C, PA-C  DULoxetine (CYMBALTA) 30 MG capsule Take 1 capsule by mouth daily. 11/05/14   [provider]  fluticasone (FLONASE) 50 MCG/ACT nasal spray Place 1-2 sprays into both nostrils daily for 10 days. 07/13/18 07/23/18  Shaylon Aden C, PA-C  gabapentin (NEURONTIN) 300 MG capsule Take 1 capsule (300 mg total) by mouth 3 (three) times daily. 04/21/17   Rosemarie Ax, MD  ibuprofen (ADVIL,MOTRIN) 600 MG tablet Take 1 tablet (600 mg total) by mouth every 8 (eight) hours as needed. 07/13/18   Lathyn Griggs C, PA-C  meloxicam (MOBIC) 15 MG tablet Take 15 mg by mouth daily.  08/07/16   [provider]  Oxycodone HCl 10 MG TABS Take 1-2 tablets (10-20 mg total) by mouth every 4 (four) hours as needed (pain). 01/23/16   Loni Dolly, PA-C  predniSONE (DELTASONE) 50 MG tablet Take 1 tablet (50 mg total) by mouth daily with breakfast for 5 days. 07/13/18 07/18/18  Ariatna Jester C, PA-C  tiZANidine (ZANAFLEX) 4 MG capsule Take 1 capsule by mouth 3 (three) times daily as needed. 11/05/14   [provider]  triamcinolone (NASACORT) 55 MCG/ACT AERO nasal inhaler Place 2 sprays into the nose daily. 05/23/18   [name], MD    Family History Family History  Problem Relation Age of Onset  . Cancer Father 92       colon   . Colon cancer Father 37       pt thinks father had colon cancer  . Stomach cancer Father   . Hypertension Mother   . Colon polyps Brother   . Aneurysm Maternal Aunt 58       brain  . Heart disease Maternal Aunt        d 54  . Cancer Paternal Uncle 28       stomach  . Aneurysm Maternal Grandfather        stomach  . Colon cancer Maternal Grandfather        thinks grandfather had colon cancer  . Cancer Paternal Grandfather 43       stomach  . Depression Brother 20       suicide  . Cancer Paternal Uncle 73       stomach  . Esophageal cancer Neg Hx   . Rectal cancer Neg Hx   . Diabetes Neg Hx     Social History Social History   Tobacco Use  . Smoking status: Current Every Day Smoker    Packs/day: 0.50    Years: 44.00    Pack years: 22.00    Types: Cigarettes  . Smokeless tobacco: Current User    Types: Snuff  Substance Use Topics  . Alcohol use: Yes    Alcohol/week: 24.0 standard drinks    Types: 24 Standard drinks or equivalent per week    Comment: social beer  . Drug use: No     Allergies   Cefuroxime axetil   Review of Systems Review of Systems  Constitutional: Positive for fever. Negative for activity change, appetite change, chills and fatigue.  HENT: Positive for congestion, rhinorrhea and sore  throat. Negative for ear pain, sinus pressure and trouble swallowing.   Eyes: Negative for discharge and redness.  Respiratory: Positive for cough and chest tightness. Negative for shortness of breath.   Cardiovascular: Negative for chest pain.  Gastrointestinal: Negative for abdominal pain, diarrhea, nausea and vomiting.  Musculoskeletal: Positive for myalgias.  Skin: Negative for rash.  Neurological:  Negative for dizziness, light-headedness and headaches.     Physical Exam Triage Vital Signs ED Triage Vitals  Enc Vitals Group     BP 07/13/18 1016 128/85     Pulse Rate 07/13/18 1016 98     Resp 07/13/18 1016 18     Temp 07/13/18 1016 99.1 F (37.3 C)     Temp Source 07/13/18 1016 Oral     SpO2 07/13/18 1016 98 %     Weight --      Height --      Head Circumference --      Peak Flow --      Pain Score 07/13/18 1017 3     Pain Loc --      Pain Edu? --      Excl. in [place]? --    No data found.  Updated Vital Signs BP 128/85 (BP Location: Left Arm)   Pulse 98   Temp 99.1 F (37.3 C) (Oral)   Resp 18   SpO2 98%   Visual Acuity Right Eye Distance:   Left Eye Distance:   Bilateral Distance:    Right Eye Near:   Left Eye Near:    Bilateral Near:     Physical Exam Vitals signs and nursing note reviewed.  Constitutional:      Appearance: He is well-developed.  HENT:     Head: Normocephalic and atraumatic.     Ears:     Comments: Bilateral ears without tenderness to palpation of external auricle, tragus and mastoid, EAC's without erythema or swelling, TM's with good bony landmarks and cone of light. Non erythematous.     Mouth/Throat:     Comments: Oral mucosa pink and moist, no tonsillar enlargement or exudate. Posterior pharynx patent and nonerythematous, no uvula deviation or swelling. Normal phonation.  Eyes:     Conjunctiva/sclera: Conjunctivae normal.  Neck:     Musculoskeletal: Neck supple.  Cardiovascular:     Rate and Rhythm: Normal rate and regular  rhythm.     Heart sounds: No murmur.  Pulmonary:     Effort: Pulmonary effort is normal. No respiratory distress.     Breath sounds: Wheezing and rhonchi present.     Comments: Significant and diffuse wheezing and rhonchi throughout bilateral lung fields auscultated on anterior and posterior lung fields  After DuoNeb lungs with less rhonchi, but still expiratory wheezing throughout all fields Abdominal:     Palpations: Abdomen is soft.     Tenderness: There is no abdominal tenderness.  Skin:    General: Skin is warm and dry.  Neurological:     Mental Status: He is alert.      UC Treatments / Results  Labs (all labs ordered are listed, but only abnormal results are displayed) Labs Reviewed - No data to display  EKG None  Radiology No results found.  Procedures Procedures (including critical care time)  Medications Ordered in UC Medications  ipratropium-albuterol (DUONEB) 0.5-2.5 (3) MG/3ML nebulizer solution 3 mL (3 mLs Nebulization Given 07/13/18 1029)    Initial Impression / Assessment and Plan / UC Course  I have reviewed the triage vital signs and the nursing notes.  Pertinent labs & imaging results that were available during my care of the patient were reviewed by me and considered in my medical decision making (see chart for details).     Likely viral URI triggering bronchitis.  Possible fevers given recent Tylenol use and body aches; possible influenza triggering this, but on edge of Tamiflu  window.  DuoNeb provided in clinic, improvement of congestion lungs but wheezing still present.  Will send home with albuterol, prednisone, Tessalon for cough.  Continue to treat congestion with Flonase, may supplement with Mucinex or Zyrtec or Claritin, will recommend symptomatic and supportive care given 2 days of this.  We will continue to monitor advised to keep an eye on his temperature, breathing and symptoms follow-up if fever persisting into early next week, developing  worsening cough or shortness of breath. Final Clinical Impressions(s) / UC Diagnoses   Final diagnoses:  Acute bronchitis, unspecified organism     Discharge Instructions     I am treating you for bronchitis, it is possible that this may be a flu-like upper respiratory infection that triggered this Begin prednisone daily for 5 days, take with food and in the morning if you are able Please use albuterol inhaler 1 to 2 puffs every 4-6 hours initially for the first 24 to 48 hours, then got as needed every 4-6 hours for shortness of breath, wheezing, chest tightness May use Tessalon every 8 hours as needed for cough Continue Flonase nasal spray 1 to 2 sprays in each nostril, may supplement with either daily cetirizine/Zyrtec or Claritin or Mucinex to further help with congestion and drainage Alternate Tylenol and ibuprofen every 4 hours for better management of any fevers and body aches, headache Rest, drink plenty of fluids  Please follow-up if developing persistent fevers, worsening shortness of breath, difficulty breathing, cough    ED Prescriptions    Medication Sig Dispense Auth. Provider   predniSONE (DELTASONE) 50 MG tablet Take 1 tablet (50 mg total) by mouth daily with breakfast for 5 days. 5 tablet Alyzae Hawkey C, PA-C   albuterol (PROVENTIL HFA;VENTOLIN HFA) 108 (90 Base) MCG/ACT inhaler Inhale 1-2 puffs into the lungs every 6 (six) hours as needed for wheezing or shortness of breath. 1 Inhaler [name] C, PA-C   fluticasone (FLONASE) 50 MCG/ACT nasal spray Place 1-2 sprays into both nostrils daily for 10 days. 1 g Isis Costanza C, PA-C   benzonatate (TESSALON) 200 MG capsule Take 1 capsule (200 mg total) by mouth 3 (three) times daily as needed for up to 7 days for cough. 28 capsule Fenna Semel C, PA-C   ibuprofen (ADVIL,MOTRIN) 600 MG tablet  (Status: Discontinued) Take 1 tablet (600 mg total) by mouth every 6 (six) hours as needed. 30 tablet Nils Thor C,  PA-C   ibuprofen (ADVIL,MOTRIN) 600 MG tablet Take 1 tablet (600 mg total) by mouth every 8 (eight) hours as needed. 30 tablet Matty Deamer, [place] C, PA-C     Controlled Substance Prescriptions [place] Controlled Substance Registry consulted? Not Applicable   Janith Lima, [place] 07/13/18 1058

## 2018-07-13 NOTE — Discharge Instructions (Addendum)
I am treating you for bronchitis, it is possible that this may be a flu-like upper respiratory infection that triggered this Begin prednisone daily for 5 days, take with food and in the morning if you are able Please use albuterol inhaler 1 to 2 puffs every 4-6 hours initially for the first 24 to 48 hours, then got as needed every 4-6 hours for shortness of breath, wheezing, chest tightness May use Tessalon every 8 hours as needed for cough Continue Flonase nasal spray 1 to 2 sprays in each nostril, may supplement with either daily cetirizine/Zyrtec or Claritin or Mucinex to further help with congestion and drainage Alternate Tylenol and ibuprofen every 4 hours for better management of any fevers and body aches, headache Rest, drink plenty of fluids  Please follow-up if developing persistent fevers, worsening shortness of breath, difficulty breathing, cough

## 2018-07-17 ENCOUNTER — Ambulatory Visit (INDEPENDENT_AMBULATORY_CARE_PROVIDER_SITE_OTHER): Payer: Medicare Other

## 2018-07-17 ENCOUNTER — Ambulatory Visit
Admission: EM | Admit: 2018-07-17 | Discharge: 2018-07-17 | Disposition: A | Discharge status: Home / Self Care | Payer: Medicare Other | Attending: Family Medicine | Admitting: Family Medicine

## 2018-07-17 ENCOUNTER — Encounter: Payer: Self-pay | Admitting: Family Medicine

## 2018-07-17 ENCOUNTER — Ambulatory Visit
Admission: EM | Admit: 2018-07-17 | Discharge: 2018-07-17 | Disposition: A | Discharge status: Home / Self Care | Payer: Medicare Other

## 2018-07-17 DIAGNOSIS — J168 Pneumonia due to other specified infectious organisms: Secondary | ICD-10-CM | POA: Diagnosis not present

## 2018-07-17 DIAGNOSIS — J181 Lobar pneumonia, unspecified organism: Secondary | ICD-10-CM | POA: Diagnosis not present

## 2018-07-17 DIAGNOSIS — J189 Pneumonia, unspecified organism: Secondary | ICD-10-CM

## 2018-07-17 DIAGNOSIS — F17210 Nicotine dependence, cigarettes, uncomplicated: Secondary | ICD-10-CM

## 2018-07-17 DIAGNOSIS — R05 Cough: Secondary | ICD-10-CM | POA: Diagnosis not present

## 2018-07-17 LAB — POCT INFLUENZA A/B
Influenza A, POC: NEGATIVE
Influenza B, POC: NEGATIVE

## 2018-07-17 MED ORDER — AMOXICILLIN-POT CLAVULANATE 875-125 MG PO TABS: 1.0000 | ORAL_TABLET | Freq: Two times a day (BID) | ORAL | 0 refills | Status: DC

## 2018-07-17 MED ORDER — AZITHROMYCIN 250 MG PO TABS: ORAL_TABLET | ORAL | 0 refills | Status: DC

## 2018-07-17 NOTE — ED Provider Notes (Addendum)
[name]    CSN: [id number] Arrival date & time: 07/17/18  1121     History   Chief Complaint Chief Complaint  Patient presents with  . Cough    HPI [name] is a 58 y.o. male.   I am writing this from memory because my initial note was not saved  58 yo patient with history of tobacco abuse comes in with 1 week history of fever and cough associated with shortness of breath.  He was attending a convention event in [place] 10 days ago at which another participant was subsequently diagnosed as having COVID-19.  Patient has a history of bronchitis.  He has an uncontrolled cough.     Past Medical History:  Diagnosis Date  . Allergy   . Anxiety   . Arthritis    Back  . Chronic back pain   . Colon polyps   . Depression   . GERD (gastroesophageal reflux disease)    every now and then  . Hyperlipidemia 09/26/2015  . Lumbar surgical wound fluid collection    lumbar wound dehiscence    Patient Active Problem List   Diagnosis Date Noted  . Left otitis media 05/23/2018  . Upper respiratory tract infection 04/21/2017  . Low testosterone 12/10/2015  . Polymyalgia ([place]) 11/11/2015  . Chronic low back pain 10/30/2015  . Easy bruising 10/30/2015  . Bursitis of right shoulder 09/30/2015  . Hyperglycemia 09/26/2015  . Hyperlipidemia 09/26/2015  . History of colonic polyps 09/26/2015  . Acute medial meniscal tear 09/09/2015  . Multiple bruises 08/28/2015  . Left knee pain 08/28/2015  . S/P lumbar spinal fusion 02/14/2014  . Obesity (BMI 30-39.9) 07/12/2013  . OA (osteoarthritis) 03/07/2012  . Fatigue 03/07/2012  . Preventative health care 09/03/2010  . History of tobacco use 09/03/2010    Past Surgical History:  Procedure Laterality Date  . BACK SURGERY    . COLONOSCOPY    . COLONOSCOPY W/ BIOPSIES AND POLYPECTOMY    . EYE SURGERY    . KNEE ARTHROSCOPY WITH MEDIAL MENISECTOMY Left 01/23/2016   Procedure: KNEE ARTHROSCOPY WITH MEDIAL  MENISECTOMY;  Surgeon: Melrose Nakayama, MD;  Location: [place];  Service: Orthopedics;  Laterality: Left;  . LUMBAR WOUND DEBRIDEMENT N/A 03/21/2014   Procedure: LUMBAR WOUND DEBRIDEMENT;  Surgeon: Eustace Moore, MD;  Location: [place] [facility];  Service: Neurosurgery;  Laterality: N/A;  . MAXIMUM ACCESS (MAS)POSTERIOR LUMBAR INTERBODY FUSION (PLIF) 1 LEVEL N/A 02/14/2014   Procedure: FOR MAXIMUM ACCESS SURGERY POSTERIOR LUMBAR INTERBODY FUSION LUMBAR TWO-THREE;  Surgeon: Eustace Moore, MD;  Location: [place] [facility];  Service: Neurosurgery;  Laterality: N/A;  . NECK SURGERY    . SHOULDER SURGERY    . SPINE SURGERY    . TONSILLECTOMY         Home Medications    Prior to Admission medications   Medication Sig Start Date End Date Taking? Authorizing Provider  albuterol (PROVENTIL HFA;VENTOLIN HFA) 108 (90 Base) MCG/ACT inhaler Inhale 1-2 puffs into the lungs every 6 (six) hours as needed for wheezing or shortness of breath. 07/13/18   Wieters, Hallie C, PA-C  amoxicillin-clavulanate (AUGMENTIN) 875-125 MG tablet Take 1 tablet by mouth every 12 (twelve) hours. 07/17/18   Robyn Haber, MD  azithromycin (ZITHROMAX) 250 MG tablet Take 2 tabs PO x 1 dose, then 1 tab PO QD x 4 days 07/17/18   Robyn Haber, MD  DULoxetine (CYMBALTA) 30 MG capsule Take 1 capsule by mouth daily. 11/05/14  [provider]  fluticasone (FLONASE) 50 MCG/ACT nasal spray Place 1-2 sprays into both nostrils daily for 10 days. 07/13/18 07/23/18  Wieters, Hallie C, PA-C  gabapentin (NEURONTIN) 300 MG capsule Take 1 capsule (300 mg total) by mouth 3 (three) times daily. 04/21/17   Rosemarie Ax, MD  ibuprofen (ADVIL,MOTRIN) 600 MG tablet Take 1 tablet (600 mg total) by mouth every 8 (eight) hours as needed. 07/13/18   Wieters, Hallie C, PA-C  meloxicam (MOBIC) 15 MG tablet Take 15 mg by mouth daily. 08/07/16   [provider]  Oxycodone HCl 10 MG TABS Take 1-2 tablets (10-20 mg total) by mouth  every 4 (four) hours as needed (pain). 01/23/16   Loni Dolly, PA-C  tiZANidine (ZANAFLEX) 4 MG capsule Take 1 capsule by mouth 3 (three) times daily as needed. 11/05/14   [provider]  triamcinolone (NASACORT) 55 MCG/ACT AERO nasal inhaler Place 2 sprays into the nose daily. 05/23/18   [name], MD    Family History Family History  Problem Relation Age of Onset  . Cancer Father 57       colon   . Colon cancer Father 106       pt thinks father had colon cancer  . Stomach cancer Father   . Hypertension Mother   . Colon polyps Brother   . Aneurysm Maternal Aunt 58       brain  . Heart disease Maternal Aunt        d 62  . Cancer Paternal Uncle 72       stomach  . Aneurysm Maternal Grandfather        stomach  . Colon cancer Maternal Grandfather        thinks grandfather had colon cancer  . Cancer Paternal Grandfather 94       stomach  . Depression Brother 20       suicide  . Cancer Paternal Uncle 73       stomach  . Esophageal cancer Neg Hx   . Rectal cancer Neg Hx   . Diabetes Neg Hx     Social History Social History   Tobacco Use  . Smoking status: Current Every Day Smoker    Packs/day: 0.50    Years: 44.00    Pack years: 22.00    Types: Cigarettes  . Smokeless tobacco: Current User    Types: Snuff  Substance Use Topics  . Alcohol use: Yes    Alcohol/week: 24.0 standard drinks    Types: 24 Standard drinks or equivalent per week    Comment: social beer  . Drug use: No     Allergies   Cefuroxime axetil   Review of Systems Review of Systems  Constitutional: Positive for fatigue and fever.  HENT: Positive for congestion and sore throat.   Respiratory: Positive for cough, chest tightness and wheezing.   Gastrointestinal: Positive for nausea. Negative for vomiting.  Musculoskeletal: Positive for myalgias.  Neurological: Positive for light-headedness.  All other systems reviewed and are negative.    Physical Exam Triage Vital Signs ED  Triage Vitals [07/17/18 1131]  Enc Vitals Group     BP (!) 164/97     Pulse Rate 89     Resp 20     Temp 98.3 F (36.8 C)     Temp Source Oral     SpO2 96 %     Weight      Height      Head Circumference  Peak Flow      Pain Score 4     Pain Loc      Pain Edu?      Excl. in [place]?    No data found.  Updated Vital Signs BP (!) 164/97 (BP Location: Left Arm)   Pulse 89   Temp 98.3 F (36.8 C) (Oral)   Resp 20   SpO2 96%    Physical Exam Vitals signs and nursing note reviewed.  Constitutional:      Appearance: Normal appearance.  HENT:     Head: Normocephalic.     Right Ear: Tympanic membrane normal.     Left Ear: Tympanic membrane normal.     Nose: Congestion present.     Mouth/Throat:     Mouth: Mucous membranes are moist.  Eyes:     Conjunctiva/sclera: Conjunctivae normal.  Neck:     Musculoskeletal: Normal range of motion and neck supple.  Cardiovascular:     Rate and Rhythm: Normal rate and regular rhythm.     Heart sounds: Normal heart sounds.  Pulmonary:     Effort: Pulmonary effort is normal.     Breath sounds: Wheezing and rhonchi present.     Comments: Bilateral wheezes and rhonchi Musculoskeletal: Normal range of motion.  Skin:    General: Skin is warm.  Neurological:     General: No focal deficit present.     Mental Status: He is alert and oriented to person, place, and time.  Psychiatric:        Mood and Affect: Mood normal.        Behavior: Behavior normal.      UC Treatments / Results  Labs (all labs ordered are listed, but only abnormal results are displayed) Labs Reviewed  POCT INFLUENZA A/B    EKG None  Radiology No results found.  Procedures Procedures (including critical care time)  Medications Ordered in UC Medications - No data to display  Initial Impression / Assessment and Plan / UC Course  I have reviewed the triage vital signs and the nursing notes.  Pertinent labs & imaging results that were available  during my care of the patient were reviewed by me and considered in my medical decision making (see chart for details).    Final Clinical Impressions(s) / UC Diagnoses   Final diagnoses:  Pneumonia of left lower lobe due to infectious organism [facility])     Discharge Instructions     You need to self-isolate.  Tomorrow you will be able to do drive-thru testing for the Covid-19 (corona virus) at [facility] in the parking lot where a tent is being set up now.  Continue your inhaler.  Take in plenty of fluids.    ED Prescriptions    Medication Sig Dispense Auth. Provider   azithromycin (ZITHROMAX) 250 MG tablet Take 2 tabs PO x 1 dose, then 1 tab PO QD x 4 days 6 tablet Robyn Haber, MD   amoxicillin-clavulanate (AUGMENTIN) 875-125 MG tablet Take 1 tablet by mouth every 12 (twelve) hours. 14 tablet Robyn Haber, MD     Controlled Substance Prescriptions [place] Controlled Substance Registry consulted? Not Applicable   Robyn Haber, MD 07/17/18 1206    Robyn Haber, MD 08/07/18 1313

## 2018-07-17 NOTE — Discharge Instructions (Addendum)
You need to self-isolate.  Tomorrow you will be able to do drive-thru testing for the Covid-19 (corona virus) at [facility] in the parking lot where a tent is being set up now.  Continue your inhaler.  Take in plenty of fluids.

## 2018-07-17 NOTE — ED Triage Notes (Signed)
Pt states seen here last week for cough and congestion, states coughing with pain to chest.

## 2018-07-18 ENCOUNTER — Telehealth (HOSPITAL_COMMUNITY): Payer: Self-pay | Admitting: *Deleted

## 2018-07-18 ENCOUNTER — Telehealth: Payer: Self-pay | Admitting: *Deleted

## 2018-07-24 ENCOUNTER — Telehealth (HOSPITAL_COMMUNITY): Payer: Self-pay | Admitting: Emergency Medicine

## 2018-07-24 LAB — NOVEL CORONAVIRUS, NAA: SARS-CoV-2, NAA: NOT DETECTED

## 2018-07-24 NOTE — Telephone Encounter (Signed)
Spoke with pt informed no results currently for covid 19 test

## 2018-08-10 DIAGNOSIS — M542 Cervicalgia: Secondary | ICD-10-CM | POA: Diagnosis not present

## 2018-08-10 DIAGNOSIS — R200 Anesthesia of skin: Secondary | ICD-10-CM | POA: Diagnosis not present

## 2018-08-10 DIAGNOSIS — M545 Low back pain: Secondary | ICD-10-CM | POA: Diagnosis not present

## 2018-08-10 DIAGNOSIS — M533 Sacrococcygeal disorders, not elsewhere classified: Secondary | ICD-10-CM | POA: Diagnosis not present

## 2018-08-17 DIAGNOSIS — M5412 Radiculopathy, cervical region: Secondary | ICD-10-CM | POA: Diagnosis not present

## 2018-08-22 NOTE — Progress Notes (Unsigned)
Subjective:   [name] is a 58 y.o. male who presents for an Initial Medicare Annual Wellness Visit.  I connected with patient 08/23/18 at  9:00 AM EDT by a video enabled telemedicine application and verified that I am speaking with the correct person using two identifiers. Patient stated full name and DOB. Patient gave permission to continue with virtual visit. Patient's location was at home and Nurse's location was at [place] office.   Review of Systems  No ROS.  Medicare Wellness Visit. Additional risk factors are reflected in the social history.    Sleep patterns: {SX; SLEEP PATTERNS:18802::"feels rested on waking","does not get up to void","gets up *** times nightly to void","sleeps *** hours nightly"}.    Home Safety/Smoke Alarms: Feels safe in home. Smoke alarms in place.  Living environment; residence and Firearm Safety: {Rehab home environment / accessibility:30080::"no firearms","firearms stored safely"}. Seat Belt Safety/Bike Helmet: Wears seat belt.   PSA-  Lab Results  Component Value Date   PSA 0.43 09/21/2017   PSA 0.60 01/13/2017   PSA 0.45 08/29/2015      Objective:    There were no vitals filed for this visit. There is no height or weight on file to calculate BMI.  Advanced Directives 07/25/2017 09/16/2016 01/23/2016 01/21/2016 12/26/2015 12/12/2015 03/20/2014  Does Patient Have a Medical Advance Directive? Yes Yes Yes Yes No Yes Yes  Type of Paramedic of [place];Living will [place];Living will Living will Living will - [place] will  Does patient want to make changes to medical advance directive? - - No - Patient declined - - - No - Patient declined  Copy of [place] in Chart? No - copy requested - No - copy requested - - - No - copy requested    Current Medications (verified) Outpatient Encounter Medications as of 08/23/2018  Medication Sig  . albuterol  (PROVENTIL HFA;VENTOLIN HFA) 108 (90 Base) MCG/ACT inhaler Inhale 1-2 puffs into the lungs every 6 (six) hours as needed for wheezing or shortness of breath.  Marland Kitchen amoxicillin-clavulanate (AUGMENTIN) 875-125 MG tablet Take 1 tablet by mouth every 12 (twelve) hours.  Marland Kitchen azithromycin (ZITHROMAX) 250 MG tablet Take 2 tabs PO x 1 dose, then 1 tab PO QD x 4 days  . DULoxetine (CYMBALTA) 30 MG capsule Take 1 capsule by mouth daily.  . fluticasone (FLONASE) 50 MCG/ACT nasal spray Place 1-2 sprays into both nostrils daily for 10 days.  Marland Kitchen gabapentin (NEURONTIN) 300 MG capsule Take 1 capsule (300 mg total) by mouth 3 (three) times daily.  Marland Kitchen ibuprofen (ADVIL,MOTRIN) 600 MG tablet Take 1 tablet (600 mg total) by mouth every 8 (eight) hours as needed.  . meloxicam (MOBIC) 15 MG tablet Take 15 mg by mouth daily.  . Oxycodone HCl 10 MG TABS Take 1-2 tablets (10-20 mg total) by mouth every 4 (four) hours as needed (pain).  Marland Kitchen tiZANidine (ZANAFLEX) 4 MG capsule Take 1 capsule by mouth 3 (three) times daily as needed.  . triamcinolone (NASACORT) 55 MCG/ACT AERO nasal inhaler Place 2 sprays into the nose daily.   No facility-administered encounter medications on file as of 08/23/2018.     Allergies (verified) Cefuroxime axetil   History: Past Medical History:  Diagnosis Date  . Allergy   . Anxiety   . Arthritis    Back  . Chronic back pain   . Colon polyps   . Depression   . GERD (gastroesophageal reflux disease)  every now and then  . Hyperlipidemia 09/26/2015  . Lumbar surgical wound fluid collection    lumbar wound dehiscence   Past Surgical History:  Procedure Laterality Date  . BACK SURGERY    . COLONOSCOPY    . COLONOSCOPY W/ BIOPSIES AND POLYPECTOMY    . EYE SURGERY    . KNEE ARTHROSCOPY WITH MEDIAL MENISECTOMY Left 01/23/2016   Procedure: KNEE ARTHROSCOPY WITH MEDIAL MENISECTOMY;  Surgeon: Melrose Nakayama, MD;  Location: [place];  Service: Orthopedics;  Laterality: Left;  .  LUMBAR WOUND DEBRIDEMENT N/A 03/21/2014   Procedure: LUMBAR WOUND DEBRIDEMENT;  Surgeon: Eustace Moore, MD;  Location: [place] [facility];  Service: Neurosurgery;  Laterality: N/A;  . MAXIMUM ACCESS (MAS)POSTERIOR LUMBAR INTERBODY FUSION (PLIF) 1 LEVEL N/A 02/14/2014   Procedure: FOR MAXIMUM ACCESS SURGERY POSTERIOR LUMBAR INTERBODY FUSION LUMBAR TWO-THREE;  Surgeon: Eustace Moore, MD;  Location: [place] [facility];  Service: Neurosurgery;  Laterality: N/A;  . NECK SURGERY    . SHOULDER SURGERY    . SPINE SURGERY    . TONSILLECTOMY     Family History  Problem Relation Age of Onset  . Cancer Father 40       colon   . Colon cancer Father 74       pt thinks father had colon cancer  . Stomach cancer Father   . Hypertension Mother   . Colon polyps Brother   . Aneurysm Maternal Aunt 58       brain  . Heart disease Maternal Aunt        d 82  . Cancer Paternal Uncle 44       stomach  . Aneurysm Maternal Grandfather        stomach  . Colon cancer Maternal Grandfather        thinks grandfather had colon cancer  . Cancer Paternal Grandfather 48       stomach  . Depression Brother 20       suicide  . Cancer Paternal Uncle 71       stomach  . Esophageal cancer Neg Hx   . Rectal cancer Neg Hx   . Diabetes Neg Hx    Social History   Socioeconomic History  . Marital status: Married    Spouse name: Not on file  . Number of children: Not on file  . Years of education: Not on file  . Highest education level: Not on file  Occupational History  . 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 Financial resource strain: Not hard at all  . Food insecurity:    Worry: Never true    Inability: Never true  . Transportation needs:    Medical: No    Non-medical: No  Tobacco Use  . Smoking status: Current Every Day Smoker    Packs/day: 0.50    Years: 44.00    Pack years: 22.00    Types: Cigarettes  . Smokeless tobacco: Current User    Types: Snuff  Substance and Sexual  Activity  . Alcohol use: Yes    Alcohol/week: 24.0 standard drinks    Types: 24 Standard drinks or equivalent per week    Comment: social beer  . Drug use: No  . Sexual activity: Yes    Partners: Female  Lifestyle  . Physical activity:    Days per week: 0 days    Minutes per session: 0 min  . Stress: Only a little  Relationships  .  Social connections:    Talks on phone: More than three times a week    Gets together: More than three times a week    Attends religious service: Not on file    Active member of club or organization: Not on file    Attends meetings of clubs or organizations: Not on file    Relationship status: Not on file  Other Topics Concern  . Not on file  Social History Narrative   Exercise--no   Tobacco Counseling Ready to quit: Not Answered Counseling given: Not Answered  Activities of Daily Living No flowsheet data found.   Immunizations and Health Maintenance Immunization History  Administered Date(s) Administered  . Influenza Split 03/07/2012  . Influenza,inj,Quad PF,6+ Mos 01/13/2017  . Influenza-Unspecified 05/03/2014, 01/31/2018  . Pneumococcal Polysaccharide-23 05/03/2008  . Tdap 05/03/2008, 05/23/2018   There are no preventive care reminders to display for this patient.  Patient Care Team: [name], MD as PCP - General (Internal Medicine)  Indicate any recent Medical Services you may have received from other than [facility] providers in the past year (date may be approximate).    Assessment:   This is a routine wellness examination for Jarrad. Physical assessment deferred to PCP.  Hearing/Vision screen No exam data present  Dietary issues and exercise activities discussed:   Diet (meal preparation, eat out, water intake, caffeinated beverages, dairy products, fruits and vegetables): {Desc; diets:16563}       Goals    . Patient Stated     Continue to travel with my brother and take family beach trips. Go fishing and take day road  trips. Continue to work in my yard and enjoy life.      Depression Screen PHQ 2/9 Scores 07/25/2017 01/13/2017 09/26/2015  PHQ - 2 Score 1 2 0  PHQ- 9 Score 3 5 -    Fall Risk Fall Risk  07/25/2017 01/13/2017 09/26/2015  Falls in the past year? Yes Yes Yes  Number falls in past yr: 2 or more 2 or more 2 or more  Comment - - hipp, [place] knee pain  Injury with Fall? Yes No -  Follow up Falls prevention discussed;Education provided - -   Cognitive Function:        Screening Tests Health Maintenance  Topic Date Due  . INFLUENZA VACCINE  12/02/2018  . COLONOSCOPY  09/17/2019  . TETANUS/TDAP  05/23/2028  . Hepatitis C Screening  Completed  . HIV Screening  Completed      Plan:     I have personally reviewed and noted the following in the patient's chart:   . Medical and social history . Use of alcohol, tobacco or illicit drugs  . Current medications and supplements . Functional ability and status . Nutritional status . Physical activity . Advanced directives . List of other physicians . Vitals . Screenings to include cognitive, depression, and falls . Referrals and appointments  In addition, I have reviewed and discussed with patient certain preventive protocols, quality metrics, and best practice recommendations. A written personalized care plan for preventive services as well as general preventive health recommendations were provided to patient.     Michiel Cowboy, RN   08/22/2018

## 2018-08-23 ENCOUNTER — Ambulatory Visit: Payer: Medicare Other

## 2018-08-28 ENCOUNTER — Other Ambulatory Visit: Payer: Self-pay

## 2018-08-28 ENCOUNTER — Encounter (HOSPITAL_COMMUNITY): Payer: Self-pay

## 2018-08-28 ENCOUNTER — Ambulatory Visit (HOSPITAL_COMMUNITY)
Admission: EM | Admit: 2018-08-28 | Discharge: 2018-08-28 | Disposition: A | Discharge status: Home / Self Care | Payer: Medicare Other | Attending: Family Medicine | Admitting: Family Medicine

## 2018-08-28 DIAGNOSIS — W19XXXA Unspecified fall, initial encounter: Secondary | ICD-10-CM

## 2018-08-28 DIAGNOSIS — S51819A Laceration without foreign body of unspecified forearm, initial encounter: Secondary | ICD-10-CM | POA: Diagnosis not present

## 2018-08-28 NOTE — Discharge Instructions (Signed)
Please change dressing daily and wash gently with warm soapy water.  Dry well after washing. Please monitor daily for developing signs of infection-increased redness swelling drainage or increased pain to these wounds.  Please follow-up if developing concern for infection. May apply ice to help with swelling Please limit use of antibiotic ointments Please use nonadherent dressings when changing wraps in order to prevent dressings from sticking to wounds.  Please go to emergency room if developing changes in vision, worsening headaches, dizziness, lightheadedness, difficulty speaking or weakness

## 2018-08-28 NOTE — ED Triage Notes (Signed)
Patient presents to Urgent Care with complaints of multiple skin tears to right arm and some on left arm since falling in the bathroom this evening. Patient states he does not take blood thinners, but does bleed easily.

## 2018-08-29 NOTE — ED Provider Notes (Signed)
[facility]    CSN: [id number] Arrival date & time: 08/28/18  1951     History   Chief Complaint Chief Complaint  Patient presents with  . skin tear    HPI [name] is a 58 y.o. male history of tobacco use, hyperlipidemia, presenting today for evaluation of skin tears after fall.  Patient states that he was walking in his bathroom and tripped and fell and hit his arm against the commode.  He sustained multiple skin tears mainly to his right arm from this.  He denies hitting his head.  Denies loss of consciousness.  His wife helped him up from the ground.  Since he has been able to walk like normally.  He had some mild dizziness initially, but denies persistent dizziness or lightheadedness.  Denies any changes in vision.  Denies weakness or difficulty speaking.  He does admit to drinking 4 beers prior to incident.  Last tetanus in January.  HPI  Past Medical History:  Diagnosis Date  . Allergy   . Anxiety   . Arthritis    Back  . Chronic back pain   . Colon polyps   . Depression   . GERD (gastroesophageal reflux disease)    every now and then  . Hyperlipidemia 09/26/2015  . Lumbar surgical wound fluid collection    lumbar wound dehiscence    Patient Active Problem List   Diagnosis Date Noted  . Left otitis media 05/23/2018  . Upper respiratory tract infection 04/21/2017  . Low testosterone 12/10/2015  . Polymyalgia ([place]) 11/11/2015  . Chronic low back pain 10/30/2015  . Easy bruising 10/30/2015  . Bursitis of right shoulder 09/30/2015  . Hyperglycemia 09/26/2015  . Hyperlipidemia 09/26/2015  . History of colonic polyps 09/26/2015  . Acute medial meniscal tear 09/09/2015  . Multiple bruises 08/28/2015  . Left knee pain 08/28/2015  . S/P lumbar spinal fusion 02/14/2014  . Obesity (BMI 30-39.9) 07/12/2013  . OA (osteoarthritis) 03/07/2012  . Fatigue 03/07/2012  . Preventative health care 09/03/2010  . History of tobacco use 09/03/2010     Past Surgical History:  Procedure Laterality Date  . BACK SURGERY    . COLONOSCOPY    . COLONOSCOPY W/ BIOPSIES AND POLYPECTOMY    . EYE SURGERY    . KNEE ARTHROSCOPY WITH MEDIAL MENISECTOMY Left 01/23/2016   Procedure: KNEE ARTHROSCOPY WITH MEDIAL MENISECTOMY;  Surgeon: Melrose Nakayama, MD;  Location: [facility];  Service: Orthopedics;  Laterality: Left;  . LUMBAR WOUND DEBRIDEMENT N/A 03/21/2014   Procedure: LUMBAR WOUND DEBRIDEMENT;  Surgeon: Eustace Moore, MD;  Location: [facility];  Service: Neurosurgery;  Laterality: N/A;  . MAXIMUM ACCESS (MAS)POSTERIOR LUMBAR INTERBODY FUSION (PLIF) 1 LEVEL N/A 02/14/2014   Procedure: FOR MAXIMUM ACCESS SURGERY POSTERIOR LUMBAR INTERBODY FUSION LUMBAR TWO-THREE;  Surgeon: Eustace Moore, MD;  Location: [facility];  Service: Neurosurgery;  Laterality: N/A;  . NECK SURGERY    . SHOULDER SURGERY    . SPINE SURGERY    . TONSILLECTOMY         Home Medications    Prior to Admission medications   Medication Sig Start Date End Date Taking? Authorizing Provider  albuterol (PROVENTIL HFA;VENTOLIN HFA) 108 (90 Base) MCG/ACT inhaler Inhale 1-2 puffs into the lungs every 6 (six) hours as needed for wheezing or shortness of breath. 07/13/18   Tayloranne Lekas C, PA-C  amoxicillin-clavulanate (AUGMENTIN) 875-125 MG tablet Take 1 tablet by mouth every 12 (twelve) hours. 07/17/18  Robyn Haber, MD  azithromycin (ZITHROMAX) 250 MG tablet Take 2 tabs PO x 1 dose, then 1 tab PO QD x 4 days 07/17/18   Robyn Haber, MD  DULoxetine (CYMBALTA) 30 MG capsule Take 1 capsule by mouth daily. 11/05/14   [provider]  fluticasone (FLONASE) 50 MCG/ACT nasal spray Place 1-2 sprays into both nostrils daily for 10 days. 07/13/18 07/23/18  Arlen Legendre C, PA-C  gabapentin (NEURONTIN) 300 MG capsule Take 1 capsule (300 mg total) by mouth 3 (three) times daily. 04/21/17   Rosemarie Ax, MD  ibuprofen (ADVIL,MOTRIN) 600 MG tablet Take 1 tablet  (600 mg total) by mouth every 8 (eight) hours as needed. 07/13/18   Aroush Chasse C, PA-C  meloxicam (MOBIC) 15 MG tablet Take 15 mg by mouth daily. 08/07/16   [provider]  Oxycodone HCl 10 MG TABS Take 1-2 tablets (10-20 mg total) by mouth every 4 (four) hours as needed (pain). 01/23/16   Loni Dolly, PA-C  tiZANidine (ZANAFLEX) 4 MG capsule Take 1 capsule by mouth 3 (three) times daily as needed. 11/05/14   [provider]  triamcinolone (NASACORT) 55 MCG/ACT AERO nasal inhaler Place 2 sprays into the nose daily. 05/23/18   [name], MD    Family History Family History  Problem Relation Age of Onset  . Cancer Father 58       colon   . Colon cancer Father 65       pt thinks father had colon cancer  . Stomach cancer Father   . Hypertension Mother   . Colon polyps Brother   . Aneurysm Maternal Aunt 58       brain  . Heart disease Maternal Aunt        d 67  . Cancer Paternal Uncle 41       stomach  . Aneurysm Maternal Grandfather        stomach  . Colon cancer Maternal Grandfather        thinks grandfather had colon cancer  . Cancer Paternal Grandfather 17       stomach  . Depression Brother 20       suicide  . Cancer Paternal Uncle 25       stomach  . Esophageal cancer Neg Hx   . Rectal cancer Neg Hx   . Diabetes Neg Hx     Social History Social History   Tobacco Use  . Smoking status: Current Every Day Smoker    Packs/day: 0.50    Years: 44.00    Pack years: 22.00    Types: Cigarettes  . Smokeless tobacco: Current User    Types: Snuff  Substance Use Topics  . Alcohol use: Yes    Alcohol/week: 24.0 standard drinks    Types: 24 Standard drinks or equivalent per week    Comment: social beer  . Drug use: No     Allergies   Cefuroxime axetil   Review of Systems Review of Systems  Constitutional: Negative for activity change, chills, diaphoresis and fatigue.  HENT: Negative for ear pain, tinnitus and trouble swallowing.   Eyes:  Negative for photophobia and visual disturbance.  Respiratory: Negative for cough, chest tightness and shortness of breath.   Cardiovascular: Negative for chest pain and leg swelling.  Gastrointestinal: Negative for abdominal pain, blood in stool, nausea and vomiting.  Musculoskeletal: Negative for arthralgias, back pain, gait problem, myalgias, neck pain and neck stiffness.  Skin: Positive for wound. Negative for color change.  Neurological: Positive for dizziness. Negative for weakness, light-headedness, numbness and headaches.     Physical Exam Triage Vital Signs ED Triage Vitals  Enc Vitals Group     BP 08/28/18 2003 100/67     Pulse Rate 08/28/18 2003 95     Resp 08/28/18 2003 16     Temp 08/28/18 2003 97.6 F (36.4 C)     Temp Source 08/28/18 2003 Oral     SpO2 08/28/18 2003 96 %     Weight --      Height --      Head Circumference --      Peak Flow --      Pain Score 08/28/18 2009 5     Pain Loc --      Pain Edu? --      Excl. in [place]? --    No data found.  Updated Vital Signs BP 100/67 (BP Location: Left Arm)   Pulse 95   Temp 97.6 F (36.4 C) (Oral)   Resp 16   SpO2 96%   Visual Acuity Right Eye Distance:   Left Eye Distance:   Bilateral Distance:    Right Eye Near:   Left Eye Near:    Bilateral Near:     Physical Exam Vitals signs and nursing note reviewed.  Constitutional:      General: He is not in acute distress.    Appearance: He is well-developed.  HENT:     Head: Normocephalic and atraumatic.     Ears:     Comments: No hemotympanum    Mouth/Throat:     Comments: Oral mucosa pink and moist, no tonsillar enlargement or exudate. Posterior pharynx patent and nonerythematous, no uvula deviation or swelling. Normal phonation. Palate elevating symmetrically Eyes:     Extraocular Movements: Extraocular movements intact.     Conjunctiva/sclera: Conjunctivae normal.     Pupils: Pupils are equal, round, and reactive to light.     Comments: Patient  does have fatigable horizontal nystagmus with extraocular motions  Neck:     Musculoskeletal: Neck supple.  Cardiovascular:     Rate and Rhythm: Normal rate and regular rhythm.     Heart sounds: No murmur.  Pulmonary:     Effort: Pulmonary effort is normal. No respiratory distress.     Breath sounds: Normal breath sounds.     Comments: Expiratory wheezing throughout bilateral lung fields Abdominal:     Palpations: Abdomen is soft.     Tenderness: There is no abdominal tenderness.  Musculoskeletal:     Comments: Full active range of motion of all extremities, no focal tenderness to bilateral wrist, throughout forearm or elbow.  Radial pulse 2+ bilaterally  Skin:    General: Skin is warm and dry.     Comments: Multiple skin tears throughout right forearm.  2 minor superficial abrasions to left elbow  Neurological:     General: No focal deficit present.     Mental Status: He is alert and oriented to person, place, and time.     Cranial Nerves: No cranial nerve deficit.     Motor: No weakness.     Comments: Slight slurring of words, but overall speech clear, no asymmetric abnormalities noted with lips and movement of mouth  Ambulating independently      UC Treatments / Results  Labs (all labs ordered are listed, but only abnormal results are displayed) Labs Reviewed - No data to display  EKG None  Radiology No results found.  Procedures Procedures (  including critical care time)  Medications Ordered in UC Medications - No data to display  Initial Impression / Assessment and Plan / UC Course  I have reviewed the triage vital signs and the nursing notes.  Pertinent labs & imaging results that were available during my care of the patient were reviewed by me and considered in my medical decision making (see chart for details).     Tetanus up-to-date.  Overall neuro exam relatively intact, did have 4 beers prior to arrival which are likely causing his slight slurring and  nystagmus.  Denied hitting head, less likely intracranial abnormality.  Skin tears reapproximated as best as possible.  Cleansed with Shur-Clens.  Wrapped with petroleum gauze, Kerlix and Coban.  Discussed wound care.  Advised and stressed importance of monitoring neurologic symptoms and following up in emergency room if symptoms progressing or worsening.Discussed strict return precautions. Patient verbalized understanding and is agreeable with plan.  Final Clinical Impressions(s) / UC Diagnoses   Final diagnoses:  Skin tear of forearm without complication, initial encounter  Fall, initial encounter     Discharge Instructions     Please change dressing daily and wash gently with warm soapy water.  Dry well after washing. Please monitor daily for developing signs of infection-increased redness swelling drainage or increased pain to these wounds.  Please follow-up if developing concern for infection. May apply ice to help with swelling Please limit use of antibiotic ointments Please use nonadherent dressings when changing wraps in order to prevent dressings from sticking to wounds.  Please go to emergency room if developing changes in vision, worsening headaches, dizziness, lightheadedness, difficulty speaking or weakness   ED Prescriptions    None     Controlled Substance Prescriptions [place] Controlled Substance Registry consulted? Not Applicable   Janith Lima, [place] 08/29/18 [phone]

## 2018-09-19 DIAGNOSIS — M542 Cervicalgia: Secondary | ICD-10-CM | POA: Diagnosis not present

## 2018-09-19 DIAGNOSIS — M5412 Radiculopathy, cervical region: Secondary | ICD-10-CM | POA: Diagnosis not present

## 2018-09-27 ENCOUNTER — Other Ambulatory Visit: Payer: Self-pay | Admitting: Internal Medicine

## 2018-09-27 ENCOUNTER — Ambulatory Visit (INDEPENDENT_AMBULATORY_CARE_PROVIDER_SITE_OTHER)
Admission: RE | Admit: 2018-09-27 | Discharge: 2018-09-27 | Disposition: A | Discharge status: Home / Self Care | Payer: Medicare Other | Source: Ambulatory Visit | Attending: Internal Medicine | Admitting: Internal Medicine

## 2018-09-27 ENCOUNTER — Other Ambulatory Visit (INDEPENDENT_AMBULATORY_CARE_PROVIDER_SITE_OTHER): Payer: Medicare Other

## 2018-09-27 ENCOUNTER — Ambulatory Visit (INDEPENDENT_AMBULATORY_CARE_PROVIDER_SITE_OTHER): Payer: Medicare Other | Admitting: *Deleted

## 2018-09-27 ENCOUNTER — Ambulatory Visit (INDEPENDENT_AMBULATORY_CARE_PROVIDER_SITE_OTHER): Payer: Medicare Other | Admitting: Internal Medicine

## 2018-09-27 ENCOUNTER — Other Ambulatory Visit: Payer: Self-pay

## 2018-09-27 ENCOUNTER — Encounter: Payer: Self-pay | Admitting: Internal Medicine

## 2018-09-27 VITALS — BP 126/88 | HR 99 | Temp 98.8°F | Ht 71.0 in | Wt 261.0 lb

## 2018-09-27 VITALS — BP 126/88 | HR 99 | Ht 71.0 in | Wt 261.0 lb

## 2018-09-27 DIAGNOSIS — R739 Hyperglycemia, unspecified: Secondary | ICD-10-CM | POA: Diagnosis not present

## 2018-09-27 DIAGNOSIS — E559 Vitamin D deficiency, unspecified: Secondary | ICD-10-CM

## 2018-09-27 DIAGNOSIS — F1010 Alcohol abuse, uncomplicated: Secondary | ICD-10-CM

## 2018-09-27 DIAGNOSIS — G8929 Other chronic pain: Secondary | ICD-10-CM

## 2018-09-27 DIAGNOSIS — R0602 Shortness of breath: Secondary | ICD-10-CM

## 2018-09-27 DIAGNOSIS — M25552 Pain in left hip: Secondary | ICD-10-CM | POA: Diagnosis not present

## 2018-09-27 DIAGNOSIS — R6889 Other general symptoms and signs: Secondary | ICD-10-CM

## 2018-09-27 DIAGNOSIS — Z0001 Encounter for general adult medical examination with abnormal findings: Secondary | ICD-10-CM | POA: Diagnosis not present

## 2018-09-27 DIAGNOSIS — M722 Plantar fascial fibromatosis: Secondary | ICD-10-CM

## 2018-09-27 DIAGNOSIS — R053 Chronic cough: Secondary | ICD-10-CM | POA: Insufficient documentation

## 2018-09-27 DIAGNOSIS — E611 Iron deficiency: Secondary | ICD-10-CM

## 2018-09-27 DIAGNOSIS — M545 Low back pain, unspecified: Secondary | ICD-10-CM

## 2018-09-27 DIAGNOSIS — R3129 Other microscopic hematuria: Secondary | ICD-10-CM

## 2018-09-27 DIAGNOSIS — R05 Cough: Secondary | ICD-10-CM

## 2018-09-27 DIAGNOSIS — Z0000 Encounter for general adult medical examination without abnormal findings: Secondary | ICD-10-CM | POA: Diagnosis not present

## 2018-09-27 DIAGNOSIS — E538 Deficiency of other specified B group vitamins: Secondary | ICD-10-CM

## 2018-09-27 DIAGNOSIS — F418 Other specified anxiety disorders: Secondary | ICD-10-CM

## 2018-09-27 DIAGNOSIS — E785 Hyperlipidemia, unspecified: Secondary | ICD-10-CM

## 2018-09-27 DIAGNOSIS — R0689 Other abnormalities of breathing: Secondary | ICD-10-CM | POA: Insufficient documentation

## 2018-09-27 DIAGNOSIS — F17200 Nicotine dependence, unspecified, uncomplicated: Secondary | ICD-10-CM

## 2018-09-27 DIAGNOSIS — R0989 Other specified symptoms and signs involving the circulatory and respiratory systems: Secondary | ICD-10-CM

## 2018-09-27 DIAGNOSIS — R1030 Lower abdominal pain, unspecified: Secondary | ICD-10-CM | POA: Diagnosis not present

## 2018-09-27 LAB — CBC WITH DIFFERENTIAL/PLATELET
Basophils Absolute: 0.1 10*3/uL (ref 0.0–0.1)
Basophils Relative: 1.5 % (ref 0.0–3.0)
Eosinophils Absolute: 0.2 10*3/uL (ref 0.0–0.7)
Eosinophils Relative: 2.2 % (ref 0.0–5.0)
HCT: 46.2 % (ref 39.0–52.0)
Hemoglobin: 15.9 g/dL (ref 13.0–17.0)
Lymphocytes Relative: 21.5 % (ref 12.0–46.0)
Lymphs Abs: 1.8 10*3/uL (ref 0.7–4.0)
MCHC: 34.5 g/dL (ref 30.0–36.0)
MCV: 93.6 fl (ref 78.0–100.0)
Monocytes Absolute: 0.7 10*3/uL (ref 0.1–1.0)
Monocytes Relative: 8.2 % (ref 3.0–12.0)
Neutro Abs: 5.5 10*3/uL (ref 1.4–7.7)
Neutrophils Relative %: 66.6 % (ref 43.0–77.0)
Platelets: 264 10*3/uL (ref 150.0–400.0)
RBC: 4.93 Mil/uL (ref 4.22–5.81)
RDW: 14.2 % (ref 11.5–15.5)
WBC: 8.3 10*3/uL (ref 4.0–10.5)

## 2018-09-27 LAB — URINALYSIS, ROUTINE W REFLEX MICROSCOPIC
Bilirubin Urine: NEGATIVE
Hgb urine dipstick: NEGATIVE
Ketones, ur: NEGATIVE
Leukocytes,Ua: NEGATIVE
Nitrite: NEGATIVE
RBC / HPF: NONE SEEN (ref 0–?)
Specific Gravity, Urine: 1.015 (ref 1.000–1.030)
Total Protein, Urine: NEGATIVE
Urine Glucose: NEGATIVE
Urobilinogen, UA: 0.2 (ref 0.0–1.0)
pH: 8 (ref 5.0–8.0)

## 2018-09-27 LAB — POCT INFLUENZA A/B
Influenza A, POC: NEGATIVE
Influenza B, POC: NEGATIVE

## 2018-09-27 LAB — HEMOGLOBIN A1C: Hgb A1c MFr Bld: 5.9 % (ref 4.6–6.5)

## 2018-09-27 LAB — LIPID PANEL
Cholesterol: 212 mg/dL — ABNORMAL HIGH (ref 0–200)
HDL: 50.7 mg/dL (ref >39.00)
LDL Cholesterol: 139 mg/dL — ABNORMAL HIGH (ref 0–99)
NonHDL: 161.29
Total CHOL/HDL Ratio: 4
Triglycerides: 112 mg/dL (ref 0.0–149.0)
VLDL: 22.4 mg/dL (ref 0.0–40.0)

## 2018-09-27 LAB — BASIC METABOLIC PANEL
BUN: 12 mg/dL (ref 6–23)
CO2: 30 mEq/L (ref 19–32)
Calcium: 9.7 mg/dL (ref 8.4–10.5)
Chloride: 103 mEq/L (ref 96–112)
Creatinine, Ser: 0.79 mg/dL (ref 0.40–1.50)
GFR: 101.16 mL/min (ref >60.00)
Glucose, Bld: 102 mg/dL — ABNORMAL HIGH (ref 70–99)
Potassium: 5.2 mEq/L — ABNORMAL HIGH (ref 3.5–5.1)
Sodium: 141 mEq/L (ref 135–145)

## 2018-09-27 LAB — HEPATIC FUNCTION PANEL
ALT: 22 U/L (ref 0–53)
AST: 18 U/L (ref 0–37)
Albumin: 4.5 g/dL (ref 3.5–5.2)
Alkaline Phosphatase: 85 U/L (ref 39–117)
Bilirubin, Direct: 0.1 mg/dL (ref 0.0–0.3)
Total Bilirubin: 0.6 mg/dL (ref 0.2–1.2)
Total Protein: 6.8 g/dL (ref 6.0–8.3)

## 2018-09-27 LAB — PSA: PSA: 0.53 ng/mL (ref 0.10–4.00)

## 2018-09-27 LAB — VITAMIN B12: Vitamin B-12: 286 pg/mL (ref 211–911)

## 2018-09-27 LAB — TSH: TSH: 1.97 u[IU]/mL (ref 0.35–4.50)

## 2018-09-27 LAB — IBC PANEL
Iron: 81 ug/dL (ref 42–165)
Saturation Ratios: 21.8 % (ref 20.0–50.0)
Transferrin: 265 mg/dL (ref 212.0–360.0)

## 2018-09-27 LAB — VITAMIN D 25 HYDROXY (VIT D DEFICIENCY, FRACTURES): VITD: 43.72 ng/mL (ref 30.00–100.00)

## 2018-09-27 MED ORDER — MELOXICAM 15 MG PO TABS: 15.0000 mg | ORAL_TABLET | Freq: Every day | ORAL | 3 refills | Status: AC

## 2018-09-27 MED ORDER — ATORVASTATIN CALCIUM 10 MG PO TABS: 10.0000 mg | ORAL_TABLET | Freq: Every day | ORAL | 3 refills | Status: DC

## 2018-09-27 NOTE — Progress Notes (Signed)
Subjective:    Patient ID: [name], male    DOB: March 31, 1961, 58 y.o.   MRN: [id number]  HPI    Here for wellness and f/u;  Overall doing ok;  Pt denies Chest pain, wheezing, orthopnea, PND, worsening LE edema, palpitations, dizziness or syncope.  Pt denies neurological change such as new headache, facial or extremity weakness.  Pt denies polydipsia, polyuria, or low sugar symptoms. Pt states overall good compliance with treatment and medications, good tolerability, and has been trying to follow appropriate diet.  Pt denies worsening depressive symptoms, suicidal ideation or panic. No fever, night sweats, wt loss, loss of appetite, or other constitutional symptoms.  Pt states good ability with ADL's, has low fall risk, home safety reviewed and adequate, no other significant changes in hearing or vision, and not recently active with exercise. Also c/o right plantar heel pain mild intermittent but not resolving x 2-3 months, without trauma or ulcerations, worse in the AM to first walk, better with more walking. Believes this may have had something to do with the onset left hip area pain with some gait change to favor the right heel --- c/o pain to left hip area persistent after a mishap at home 1 mo ago - Had a trip and fall after a sharp transient left hip pain, then the leg stumbled and went down and really scraped up the right forearm extensively on the commode lid.  Has a sor of "line" conncecting the mid left buttock to the left groin area, worse in past x 2 wks, intermittent but happens at least 75% of time, mod to severe, sharp, walking makes it worse initially but can improve with walking as well.   Pt continues to have recurring LBP without change in severity, bowel or bladder change, fever, wt loss,  worsening LE pain/numbness/weakness, or falls.  Remains disabled x 5 yrs with chronic low back pain.  Also endorses ETOH use increased - 12 pk - 1 case per wk.  .  Has some ongoing mild  depression and anxiety he thinks is not enough for treatment or counseling. Denies suicidal ideation, or panic.   Also s/p LLL pna now improved from 2 mo ago, but has a persistent prod cough new since then and mild DOE, without further fever, CP or chills.  No hx of bronchiectasis or chronic lung disease.  Denies recent impairment from ETOH to suggest aspiration. Denies urinary symptoms such as dysuria, frequency, urgency, flank pain, hematuria or n/v, fever, chills. Past Medical History:  Diagnosis Date  . Allergy   . Anxiety   . Arthritis    Back  . Chronic back pain   . Colon polyps   . Depression   . GERD (gastroesophageal reflux disease)    every now and then  . Hyperlipidemia 09/26/2015  . Lumbar surgical wound fluid collection    lumbar wound dehiscence   Past Surgical History:  Procedure Laterality Date  . BACK SURGERY    . COLONOSCOPY    . COLONOSCOPY W/ BIOPSIES AND POLYPECTOMY    . EYE SURGERY    . KNEE ARTHROSCOPY WITH MEDIAL MENISECTOMY Left 01/23/2016   Procedure: KNEE ARTHROSCOPY WITH MEDIAL MENISECTOMY;  Surgeon: Melrose Nakayama, MD;  Location: [facility];  Service: Orthopedics;  Laterality: Left;  . LUMBAR WOUND DEBRIDEMENT N/A 03/21/2014   Procedure: LUMBAR WOUND DEBRIDEMENT;  Surgeon: Eustace Moore, MD;  Location: [facility];  Service: Neurosurgery;  Laterality: N/A;  . MAXIMUM ACCESS (MAS)POSTERIOR  LUMBAR INTERBODY FUSION (PLIF) 1 LEVEL N/A 02/14/2014   Procedure: FOR MAXIMUM ACCESS SURGERY POSTERIOR LUMBAR INTERBODY FUSION LUMBAR TWO-THREE;  Surgeon: Eustace Moore, MD;  Location: [facility];  Service: Neurosurgery;  Laterality: N/A;  . NECK SURGERY    . SHOULDER SURGERY    . SPINE SURGERY    . TONSILLECTOMY      reports that he has been smoking cigarettes. He has a 22.00 pack-year smoking history. His smokeless tobacco use includes snuff. He reports current alcohol use of about 24.0 standard drinks of alcohol per week. He reports that he does  not use drugs. family history includes Aneurysm in his maternal grandfather; Aneurysm (age of onset: 50) in his maternal aunt; Cancer (age of onset: 36) in his paternal uncle; Cancer (age of onset: 30) in his father; Cancer (age of onset: 66) in his paternal uncle; Cancer (age of onset: 36) in his paternal grandfather; Colon cancer in his maternal grandfather; Colon cancer (age of onset: 32) in his father; Colon polyps in his brother; Depression (age of onset: 40) in his brother; Heart disease in his maternal aunt; Hypertension in his mother; Stomach cancer in his father. Allergies  Allergen Reactions  . Cefuroxime Axetil     REACTION: hives   Current Outpatient Medications on File Prior to Visit  Medication Sig Dispense Refill  . albuterol (PROVENTIL HFA;VENTOLIN HFA) 108 (90 Base) MCG/ACT inhaler Inhale 1-2 puffs into the lungs every 6 (six) hours as needed for wheezing or shortness of breath. 1 Inhaler 0  . azithromycin (ZITHROMAX) 250 MG tablet Take 2 tabs PO x 1 dose, then 1 tab PO QD x 4 days 6 tablet 0  . DULoxetine (CYMBALTA) 30 MG capsule Take 1 capsule by mouth daily.  2  . gabapentin (NEURONTIN) 300 MG capsule Take 1 capsule (300 mg total) by mouth 3 (three) times daily. 90 capsule 3  . ibuprofen (ADVIL,MOTRIN) 600 MG tablet Take 1 tablet (600 mg total) by mouth every 8 (eight) hours as needed. 30 tablet 0  . Oxycodone HCl 10 MG TABS Take 1-2 tablets (10-20 mg total) by mouth every 4 (four) hours as needed (pain). 30 tablet 0  . tiZANidine (ZANAFLEX) 4 MG capsule Take 1 capsule by mouth 3 (three) times daily as needed.  2  . triamcinolone (NASACORT) 55 MCG/ACT AERO nasal inhaler Place 2 sprays into the nose daily. 1 Inhaler 12  . fluticasone (FLONASE) 50 MCG/ACT nasal spray Place 1-2 sprays into both nostrils daily for 10 days. 1 g 0   No current facility-administered medications on file prior to visit.    Review of Systems Constitutional: Negative for other unusual diaphoresis,  sweats, appetite or weight changes HENT: Negative for other worsening hearing loss, ear pain, facial swelling, mouth sores or neck stiffness.   Eyes: Negative for other worsening pain, redness or other visual disturbance.  Respiratory: Negative for other stridor or swelling Cardiovascular: Negative for other palpitations or other chest pain  Gastrointestinal: Negative for worsening diarrhea or loose stools, blood in stool, distention or other pain Genitourinary: Negative for hematuria, flank pain or other change in urine volume.  Musculoskeletal: Negative for myalgias or other joint swelling.  Skin: Negative for other color change, or other wound or worsening drainage.  Neurological: Negative for other syncope or numbness. Hematological: Negative for other adenopathy or swelling Psychiatric/Behavioral: Negative for hallucinations, other worsening agitation, SI, self-injury, or new decreased concentration All other system neg per pt    Objective:   Physical  Exam BP 126/88   Pulse 99   Temp 98.8 F (37.1 C) (Oral)   Ht 5\' 11"  (1.803 m)   Wt 261 lb (118.4 kg)   SpO2 95%   BMI 36.40 kg/m  VS noted,  Constitutional: Pt is oriented to person, place, and time. Appears well-developed and well-nourished, in no significant distress and comfortable Head: Normocephalic and atraumatic  Eyes: Conjunctivae and EOM are normal. Pupils are equal, round, and reactive to light Right Ear: External ear normal without discharge Left Ear: External ear normal without discharge Nose: Nose without discharge or deformity Mouth/Throat: Oropharynx is without other ulcerations and moist  Neck: Normal range of motion. Neck supple. No JVD present. No tracheal deviation present or significant neck LA or mass Cardiovascular: Normal rate, regular rhythm, normal heart sounds and intact distal pulses.   Pulmonary/Chest: WOB normal and breath sounds without wheezing but with very few left lower lung field rales   Abdominal: Soft. Bowel sounds are normal. NT. No HSM  Musculoskeletal: Normal range of motion. Exhibits no edema except for reduced ROM left hip to internal rotation due to elicited pain Lymphadenopathy: Has no other cervical adenopathy.  Neurological: Pt is alert and oriented to person, place, and time. Pt has normal reflexes. No cranial nerve deficit. Motor grossly intact, Gait intact Skin: Skin is warm and dry. No rash noted or new ulcerations Psychiatric:  Has depressed nervous mood and affect. Behavior is normal without agitation No other exam findings  Rapid flu - Neg    Assessment & Plan:

## 2018-09-27 NOTE — Progress Notes (Addendum)
Subjective:   [name] is a 58 y.o. male who presents for Medicare Annual/Subsequent preventive examination.  Review of Systems:  No ROS.  Medicare Wellness Virtual Visit.  Visual/audio telehealth visit, UTA vital signs.   See social history for additional risk factors.  Cardiac Risk Factors include: advanced age (>26men, >9 women);dyslipidemia;male gender Sleep patterns: feels rested on waking, gets up 1-3 times nightly to void and sleeps 7-8 hours nightly.    Home Safety/Smoke Alarms: Feels safe in home. Smoke alarms in place.  Living environment; residence and Firearm Safety: 1-story house/ trailer. Lives with wife, no needs for DME, good support system Seat Belt Safety/Bike Helmet: Wears seat belt.   PSA-  Lab Results  Component Value Date   PSA 0.43 09/21/2017   PSA 0.60 01/13/2017   PSA 0.45 08/29/2015       Objective:    Vitals: There were no vitals taken for this visit.  There is no height or weight on file to calculate BMI.  Advanced Directives 09/27/2018 07/25/2017 09/16/2016 01/23/2016 01/21/2016 12/26/2015 12/12/2015  Does Patient Have a Medical Advance Directive? No Yes Yes Yes Yes No Yes  Type of Advance Directive - [place];Living will [place];Living will Living will Living will - [place]  Does patient want to make changes to medical advance directive? Yes (ED - Information included in AVS) - - No - Patient declined - - -  Copy of [place] in Chart? - No - copy requested - No - copy requested - - -    Tobacco Social History   Tobacco Use  Smoking Status Current Every Day Smoker  . Packs/day: 0.50  . Years: 44.00  . Pack years: 22.00  . Types: Cigarettes  Smokeless Tobacco Current User  . Types: Snuff     Ready to quit: Yes Counseling given: Yes  Past Medical History:  Diagnosis Date  . Allergy   . Anxiety   . Arthritis    Back  . Chronic back pain   . Colon  polyps   . Depression   . GERD (gastroesophageal reflux disease)    every now and then  . Hyperlipidemia 09/26/2015  . Lumbar surgical wound fluid collection    lumbar wound dehiscence   Past Surgical History:  Procedure Laterality Date  . BACK SURGERY    . COLONOSCOPY    . COLONOSCOPY W/ BIOPSIES AND POLYPECTOMY    . EYE SURGERY    . KNEE ARTHROSCOPY WITH MEDIAL MENISECTOMY Left 01/23/2016   Procedure: KNEE ARTHROSCOPY WITH MEDIAL MENISECTOMY;  Surgeon: Melrose Nakayama, MD;  Location: [facility];  Service: Orthopedics;  Laterality: Left;  . LUMBAR WOUND DEBRIDEMENT N/A 03/21/2014   Procedure: LUMBAR WOUND DEBRIDEMENT;  Surgeon: Eustace Moore, MD;  Location: [facility];  Service: Neurosurgery;  Laterality: N/A;  . MAXIMUM ACCESS (MAS)POSTERIOR LUMBAR INTERBODY FUSION (PLIF) 1 LEVEL N/A 02/14/2014   Procedure: FOR MAXIMUM ACCESS SURGERY POSTERIOR LUMBAR INTERBODY FUSION LUMBAR TWO-THREE;  Surgeon: Eustace Moore, MD;  Location: [facility];  Service: Neurosurgery;  Laterality: N/A;  . NECK SURGERY    . SHOULDER SURGERY    . SPINE SURGERY    . TONSILLECTOMY     Family History  Problem Relation Age of Onset  . Cancer Father 82       colon   . Colon cancer Father 69       pt thinks father had colon cancer  .  Stomach cancer Father   . Hypertension Mother   . Colon polyps Brother   . Aneurysm Maternal Aunt 58       brain  . Heart disease Maternal Aunt        d 64  . Cancer Paternal Uncle 38       stomach  . Aneurysm Maternal Grandfather        stomach  . Colon cancer Maternal Grandfather        thinks grandfather had colon cancer  . Cancer Paternal Grandfather 71       stomach  . Depression Brother 20       suicide  . Cancer Paternal Uncle 71       stomach  . Esophageal cancer Neg Hx   . Rectal cancer Neg Hx   . Diabetes Neg Hx    Social History   Socioeconomic History  . Marital status: Married    Spouse name: Not on file  . Number of children: Not  on file  . Years of education: Not on file  . Highest education level: Not on file  Occupational History  . Occupation: produce manager/ disability    Employer: [employer]  Social Needs  . Financial resource strain: Not hard at all  . Food insecurity:    Worry: Never true    Inability: Never true  . Transportation needs:    Medical: No    Non-medical: No  Tobacco Use  . Smoking status: Current Every Day Smoker    Packs/day: 0.50    Years: 44.00    Pack years: 22.00    Types: Cigarettes  . Smokeless tobacco: Current User    Types: Snuff  Substance and Sexual Activity  . Alcohol use: Yes    Alcohol/week: 24.0 standard drinks    Types: 24 Standard drinks or equivalent per week    Comment: social beer  . Drug use: No  . Sexual activity: Yes    Partners: Female  Lifestyle  . Physical activity:    Days per week: 0 days    Minutes per session: 0 min  . Stress: Only a little  Relationships  . Social connections:    Talks on phone: More than three times a week    Gets together: More than three times a week    Attends religious service: Not on file    Active member of club or organization: Not on file    Attends meetings of clubs or organizations: Not on file    Relationship status: Married  Other Topics Concern  . Not on file  Social History Narrative   Exercise--no    Outpatient Encounter Medications as of 09/27/2018  Medication Sig  . albuterol (PROVENTIL HFA;VENTOLIN HFA) 108 (90 Base) MCG/ACT inhaler Inhale 1-2 puffs into the lungs every 6 (six) hours as needed for wheezing or shortness of breath.  Marland Kitchen azithromycin (ZITHROMAX) 250 MG tablet Take 2 tabs PO x 1 dose, then 1 tab PO QD x 4 days  . DULoxetine (CYMBALTA) 30 MG capsule Take 1 capsule by mouth daily.  . fluticasone (FLONASE) 50 MCG/ACT nasal spray Place 1-2 sprays into both nostrils daily for 10 days.  Marland Kitchen gabapentin (NEURONTIN) 300 MG capsule Take 1 capsule (300 mg total) by mouth 3 (three) times daily.  Marland Kitchen  ibuprofen (ADVIL,MOTRIN) 600 MG tablet Take 1 tablet (600 mg total) by mouth every 8 (eight) hours as needed.  . Oxycodone HCl 10 MG TABS Take 1-2 tablets (10-20 mg  total) by mouth every 4 (four) hours as needed (pain).  Marland Kitchen tiZANidine (ZANAFLEX) 4 MG capsule Take 1 capsule by mouth 3 (three) times daily as needed.  . triamcinolone (NASACORT) 55 MCG/ACT AERO nasal inhaler Place 2 sprays into the nose daily.  . [DISCONTINUED] amoxicillin-clavulanate (AUGMENTIN) 875-125 MG tablet Take 1 tablet by mouth every 12 (twelve) hours. (Patient not taking: Reported on 09/27/2018)  . [DISCONTINUED] meloxicam (MOBIC) 15 MG tablet Take 15 mg by mouth daily.   No facility-administered encounter medications on file as of 09/27/2018.     Activities of Daily Living In your present state of health, do you have any difficulty performing the following activities: 09/27/2018  Hearing? N  Vision? N  Difficulty concentrating or making decisions? N  Walking or climbing stairs? N  Dressing or bathing? N  Doing errands, shopping? N  Preparing Food and eating ? N  Using the Toilet? N  In the past six months, have you accidently leaked urine? N  Do you have problems with loss of bowel control? N  Managing your Medications? N  Managing your Finances? N  Housekeeping or managing your Housekeeping? N  Some recent data might be hidden    Patient Care Team: [name], MD as PCP - General (Internal Medicine)   Assessment:   This is a routine wellness examination for Sesar. Physical assessment deferred to PCP.  Exercise Activities and Dietary recommendations Current Exercise Habits: The patient does not participate in regular exercise at present, Exercise limited by: orthopedic condition(s)  Diet (meal preparation, eat out, water intake, caffeinated beverages, dairy products, fruits and vegetables): in general, a "healthy" diet  , well balanced   Reviewed heart healthy diet. Encouraged patient to maintain daily  water and healthy fluid intake.  Goals    . Patient Stated     Continue to travel with my brother and take family beach trips. Go fishing and take day road trips. Continue to work in my yard and enjoy life.       Fall Risk Fall Risk  09/27/2018 09/27/2018 07/25/2017 01/13/2017 09/26/2015  Falls in the past year? 1 1 Yes Yes Yes  Number falls in past yr: 0 0 2 or more 2 or more 2 or more  Comment - - - - hipp, groinand knee pain  Injury with Fall? - - Yes No -  Risk for fall due to : Impaired balance/gait;Impaired mobility - - - -  Follow up - - Falls prevention discussed;Education provided - -    Depression Screen PHQ 2/9 Scores 09/27/2018 09/27/2018 07/25/2017 01/13/2017  PHQ - 2 Score 1 1 1 2   PHQ- 9 Score - - 3 5    Cognitive Function       Ad8 score reviewed for issues:  Issues making decisions: no  Less interest in hobbies / activities: no  Repeats questions, stories (family complaining): no  Trouble using ordinary gadgets (microwave, computer, phone):no  Forgets the month or year: no  Mismanaging finances: no  Remembering appts: no  Daily problems with thinking and/or memory: no Ad8 score is= 0  Immunization History  Administered Date(s) Administered  . Influenza Split 03/07/2012  . Influenza,inj,Quad PF,6+ Mos 01/13/2017  . Influenza-Unspecified 05/03/2014, 01/31/2018  . Pneumococcal Polysaccharide-23 05/03/2008  . Tdap 05/03/2008, 05/23/2018   Screening Tests Health Maintenance  Topic Date Due  . INFLUENZA VACCINE  12/02/2018  . COLONOSCOPY  09/17/2019  . TETANUS/TDAP  05/23/2028  . Hepatitis C Screening  Completed  . HIV Screening  Completed       Plan:    Continue doing brain stimulating activities (puzzles, reading, adult coloring books, staying active) to keep memory sharp.   Continue to eat heart healthy diet (full of fruits, vegetables, whole grains, lean protein, water--limit salt, fat, and sugar intake) and increase physical activity as  tolerated.  I have personally reviewed and noted the following in the patient's chart:   . Medical and social history . Use of alcohol, tobacco or illicit drugs  . Current medications and supplements . Functional ability and status . Nutritional status . Physical activity . Advanced directives . List of other physicians . Screenings to include cognitive, depression, and falls . Referrals and appointments  In addition, I have reviewed and discussed with patient certain preventive protocols, quality metrics, and best practice recommendations. A written personalized care plan for preventive services as well as general preventive health recommendations were provided to patient.     Michiel Cowboy, RN  09/27/2018  Medical screening examination/treatment/procedure(s) were performed by non-physician practitioner and as supervising physician I was immediately available for consultation/collaboration. I agree with above. Cathlean Cower, MD

## 2018-09-27 NOTE — Patient Instructions (Addendum)
[employer] LiveWell Line at [phone]  1-800-QUIT-NOW (406) 212-0183). http://bell-fernandez.com/  Continue to eat heart healthy diet (full of fruits, vegetables, whole grains, lean protein, water--limit salt, fat, and sugar intake) and increase physical activity as tolerated.  Continue doing brain stimulating activities (puzzles, reading, adult coloring books, staying active) to keep memory sharp.    [name] , Thank you for taking time to come for your Medicare Wellness Visit. I appreciate your ongoing commitment to your health goals. Please review the following plan we discussed and let me know if I can assist you in the future.   These are the goals we discussed: Goals    . Patient Stated     Continue to travel with my brother and take family beach trips. Go fishing and take day road trips. Continue to work in my yard and enjoy life.       This is a list of the screening recommended for you and due dates:  Health Maintenance  Topic Date Due  . Flu Shot  12/02/2018  . Colon Cancer Screening  09/17/2019  . Tetanus Vaccine  05/23/2028  .  Hepatitis C: One time screening is recommended by Center for Disease Control  (CDC) for  adults born from [birth year] through 1964.   Completed  . HIV Screening  Completed     Steps to Quit Smoking  Smoking tobacco can be bad for your health. It can also affect almost every organ in your body. Smoking puts you and people around you at risk for many serious long-lasting (chronic) diseases. Quitting smoking is hard, but it is one of the best things that you can do for your health. It is never too late to quit. What are the benefits of quitting smoking? When you quit smoking, you lower your risk for getting serious diseases and conditions. They can include:  Lung cancer or lung disease.  Heart disease.  Stroke.  Heart attack.  Not being able to have  children (infertility).  Weak bones (osteoporosis) and broken bones (fractures). If you have coughing, wheezing, and shortness of breath, those symptoms may get better when you quit. You may also get sick less often. If you are pregnant, quitting smoking can help to lower your chances of having a baby of low birth weight. What can I do to help me quit smoking? Talk with your doctor about what can help you quit smoking. Some things you can do (strategies) include:  Quitting smoking totally, instead of slowly cutting back how much you smoke over a period of time.  Going to in-person counseling. You are more likely to quit if you go to many counseling sessions.  Using resources and support systems, such as: ? Database administrator with a Social worker. ? Phone quitlines. ? Careers information officer. ? Support groups or group counseling. ? Text messaging programs. ? Mobile phone apps or applications.  Taking medicines. Some of these medicines may have nicotine in them. If you are pregnant or breastfeeding, do not take any medicines to quit smoking unless your doctor says it is okay. Talk with your doctor about counseling or other things that can help you. Talk with your doctor about using more than one strategy at the same time, such as taking medicines while you are also going to in-person counseling. This can help make quitting easier. What things can I do to make it easier to quit? Quitting smoking might feel very hard at first, but there is a lot that you can do to make it  easier. Take these steps:  Talk to your family and friends. Ask them to support and encourage you.  Call phone quitlines, reach out to support groups, or work with a Social worker.  Ask people who smoke to not smoke around you.  Avoid places that make you want (trigger) to smoke, such as: ? Bars. ? Parties. ? Smoke-break areas at work.  Spend time with people who do not smoke.  Lower the stress in your life. Stress can make  you want to smoke. Try these things to help your stress: ? Getting regular exercise. ? Deep-breathing exercises. ? Yoga. ? Meditating. ? Doing a body scan. To do this, close your eyes, focus on one area of your body at a time from head to toe, and notice which parts of your body are tense. Try to relax the muscles in those areas.  Download or buy apps on your mobile phone or tablet that can help you stick to your quit plan. There are many free apps, such as QuitGuide from the [employer] Office manager for Disease Control and Prevention). You can find more support from smokefree.gov and other websites. This information is not intended to replace advice given to you by your health care provider. Make sure you discuss any questions you have with your health care provider. Document Released: 02/13/2009 Document Revised: 12/16/2015 Document Reviewed: 09/03/2014 Elsevier Interactive Patient Education  2019 [employer].

## 2018-09-27 NOTE — Patient Instructions (Signed)
Please take all new medication as prescribed - the mobic for pain  Please continue all other medications as before, and refills have been done if requested.  Please have the pharmacy call with any other refills you may need.  Please continue your efforts at being more active, low cholesterol diet, and weight control.  You are otherwise up to date with prevention measures today.  Please keep your appointments with your specialists as you may have planned  Please stop drinking alcohol and smoking  You will be contacted regarding the referral for: Sports medicine, and CT scan for chest/abdomen/pelvis  Please go to the XRAY Department in the Basement (go straight as you get off the elevator) for the x-ray testing today  Please go to the LAB in the Basement (turn left off the elevator) for the tests to be done today  You will be contacted by phone if any changes need to be made immediately.  Otherwise, you will receive a letter about your results with an explanation, but please check with MyChart first.  Please remember to sign up for MyChart if you have not done so, as this will be important to you in the future with finding out test results, communicating by private email, and scheduling acute appointments online when needed.  Please return in 3 months, or sooner if needed

## 2018-09-28 ENCOUNTER — Telehealth: Payer: Self-pay

## 2018-09-28 NOTE — Telephone Encounter (Signed)
Pt has viewed results via MyChart  

## 2018-09-28 NOTE — Telephone Encounter (Signed)
-----   Message from [name], MD sent at 09/27/2018  9:33 PM EDT ----- Left message on MyChart, pt to cont same tx except  The test results show that your current treatment is OK, except the LDL cholesterol is mildly too high.  Please start lipitor 10 mg and a low cholesterol diet, to reduce your future risk of heart disease and stroke.Redmond Baseman to please inform pt, I will do rx

## 2018-09-29 ENCOUNTER — Encounter: Payer: Self-pay | Admitting: Internal Medicine

## 2018-09-29 DIAGNOSIS — F418 Other specified anxiety disorders: Secondary | ICD-10-CM | POA: Insufficient documentation

## 2018-09-29 NOTE — Assessment & Plan Note (Signed)
Goal ldl < 100, for lipids with labs

## 2018-09-29 NOTE — Assessment & Plan Note (Signed)
No gross hematuria, but noted with prior labs, now willing for evaluation, for CT abd/[pelvis to r/o malignancy or stone, declines urology referral for now however after I mentioned cystoscopy

## 2018-09-29 NOTE — Assessment & Plan Note (Addendum)
I suspect soft tissue issue/straain, will have hip film r/o bony pathology, also refer Sport medicine

## 2018-09-29 NOTE — Assessment & Plan Note (Signed)
Mild chronic peristent, no SI or HI, declines tx for now or counseling referral

## 2018-09-29 NOTE — Assessment & Plan Note (Signed)
D/w pt to abstain or at least moderate intake

## 2018-09-29 NOTE — Assessment & Plan Note (Signed)

## 2018-09-29 NOTE — Assessment & Plan Note (Signed)
Unusual for him, doubt recurrent aspiration though cant r/o completely; prior cxr c/w LLL infiltrate, given the chronicity will need chest ct to r/o LLL pathology, including bronchiectasis or malignancy

## 2018-09-29 NOTE — Assessment & Plan Note (Addendum)
Asympt, for a1c with labs  In addition to the time spent performing CPE, I spent an additional 40 minutes face to face,in which greater than 50% of this time was spent in counseling and coordination of care for patient's acute illness as documented, including the differential dx, treatment, further evaluation and other management of hyperglycemia, abnormal BS, ETOh abuse, chronic LBP, left hip pain, right heel pain, microhemturia, anxious depression, chronic coughm HLD, smoker

## 2018-09-29 NOTE — Assessment & Plan Note (Signed)
Mild but persistent, for soft soled shoes, excercises, shoe inserts and mobic prn

## 2018-09-29 NOTE — Assessment & Plan Note (Signed)
Very few LLL rales, unclear significance, for ct as above

## 2018-09-29 NOTE — Assessment & Plan Note (Signed)
Encouraged to quit, did not tolerate chantix previously with bad dreams and confusion

## 2018-09-29 NOTE — Assessment & Plan Note (Signed)
Remains disabled, no longer on opiate tx, cont mobic prn

## 2018-10-13 ENCOUNTER — Encounter: Payer: Self-pay | Admitting: Internal Medicine

## 2018-10-16 ENCOUNTER — Ambulatory Visit (INDEPENDENT_AMBULATORY_CARE_PROVIDER_SITE_OTHER)
Admission: RE | Admit: 2018-10-16 | Discharge: 2018-10-16 | Disposition: A | Discharge status: Home / Self Care | Payer: Medicare Other | Source: Ambulatory Visit | Attending: Internal Medicine | Admitting: Internal Medicine

## 2018-10-16 ENCOUNTER — Other Ambulatory Visit: Payer: Self-pay

## 2018-10-16 DIAGNOSIS — J439 Emphysema, unspecified: Secondary | ICD-10-CM | POA: Diagnosis not present

## 2018-10-16 DIAGNOSIS — R0602 Shortness of breath: Secondary | ICD-10-CM

## 2018-10-16 DIAGNOSIS — R3129 Other microscopic hematuria: Secondary | ICD-10-CM | POA: Diagnosis not present

## 2018-10-16 MED ORDER — IOHEXOL 300 MG/ML  SOLN
100.0000 mL | Freq: Once | INTRAMUSCULAR | Status: AC | PRN
  Administered 2018-10-16: 100 mL via INTRAVENOUS

## 2018-10-18 ENCOUNTER — Telehealth: Payer: Self-pay | Admitting: Internal Medicine

## 2018-10-18 NOTE — Telephone Encounter (Signed)
Pt states he has gotten his CT results off of mychart.  Is requesting call back to review.

## 2018-10-18 NOTE — Telephone Encounter (Signed)
Returned pt's call to discuss, LVM.

## 2018-10-19 DIAGNOSIS — M5416 Radiculopathy, lumbar region: Secondary | ICD-10-CM | POA: Diagnosis not present

## 2018-11-01 ENCOUNTER — Encounter: Payer: Self-pay | Admitting: Family Medicine

## 2018-11-01 ENCOUNTER — Other Ambulatory Visit: Payer: Self-pay

## 2018-11-01 ENCOUNTER — Ambulatory Visit: Payer: Medicare Other | Admitting: Family Medicine

## 2018-11-01 ENCOUNTER — Ambulatory Visit: Payer: Self-pay

## 2018-11-01 VITALS — BP 140/84 | HR 102 | Ht 71.0 in | Wt 260.0 lb

## 2018-11-01 DIAGNOSIS — M79671 Pain in right foot: Secondary | ICD-10-CM | POA: Diagnosis not present

## 2018-11-01 DIAGNOSIS — M216X2 Other acquired deformities of left foot: Secondary | ICD-10-CM | POA: Insufficient documentation

## 2018-11-01 DIAGNOSIS — M722 Plantar fascial fibromatosis: Secondary | ICD-10-CM

## 2018-11-01 DIAGNOSIS — M216X1 Other acquired deformities of right foot: Secondary | ICD-10-CM | POA: Diagnosis not present

## 2018-11-01 NOTE — Patient Instructions (Addendum)
Good to see you  Ice 20 minutes 2 times daily. Usually after activity and before bed. Avoid being barefoot   OOFOS or HOKA recovery sandals in the house Exercises 3 times a week.  Keep stretching when you can.  Vitamin D 2000 IU dialy  Voltaren gel 1% over the counter 2 times a day  See me again in 4 weeks if not better

## 2018-11-01 NOTE — Assessment & Plan Note (Signed)
Patient given injection.  Tolerated procedure well.  Discussed icing regimen and home exercise.  Discussed over-the-counter orthotics would be beneficial.  May need custom orthotics in the long run secondary to the overpronation.  Follow-up again in 4-6

## 2018-11-01 NOTE — Progress Notes (Signed)
[name] [address], [place] [address] Phone: [phone] Subjective:   I [name] am serving as a Education administrator for Dr. Hulan Saas.  I'm seeing this patient by the request  of:    CC: right foot pain   ITG:[id number]  [name] is a 58 y.o. male coming in with complaint of right foot pain.   Onset- Chronic (2-3 weeks) Location - Heel  Character- sharp, dull, achy, sore Aggravating factors- walking  Reliving factors-increasing activity Therapies tried- Ice did not help  Severity-8 out of 10 with the first of 2 morning.  Gets better with activity and the symptoms get worse with sitting for a while.     Past Medical History:  Diagnosis Date  . Allergy   . Anxiety   . Arthritis    Back  . Chronic back pain   . Colon polyps   . Depression   . GERD (gastroesophageal reflux disease)    every now and then  . Hyperlipidemia 09/26/2015  . Lumbar surgical wound fluid collection    lumbar wound dehiscence   Past Surgical History:  Procedure Laterality Date  . BACK SURGERY    . COLONOSCOPY    . COLONOSCOPY W/ BIOPSIES AND POLYPECTOMY    . EYE SURGERY    . KNEE ARTHROSCOPY WITH MEDIAL MENISECTOMY Left 01/23/2016   Procedure: KNEE ARTHROSCOPY WITH MEDIAL MENISECTOMY;  Surgeon: Melrose Nakayama, MD;  Location: [facility];  Service: Orthopedics;  Laterality: Left;  . LUMBAR WOUND DEBRIDEMENT N/A 03/21/2014   Procedure: LUMBAR WOUND DEBRIDEMENT;  Surgeon: Eustace Moore, MD;  Location: [facility];  Service: Neurosurgery;  Laterality: N/A;  . MAXIMUM ACCESS (MAS)POSTERIOR LUMBAR INTERBODY FUSION (PLIF) 1 LEVEL N/A 02/14/2014   Procedure: FOR MAXIMUM ACCESS SURGERY POSTERIOR LUMBAR INTERBODY FUSION LUMBAR TWO-THREE;  Surgeon: Eustace Moore, MD;  Location: [facility];  Service: Neurosurgery;  Laterality: N/A;  . NECK SURGERY    . SHOULDER SURGERY    . SPINE SURGERY    . TONSILLECTOMY     Social History    Socioeconomic History  . Marital status: Married    Spouse name: Not on file  . Number of children: Not on file  . Years of education: Not on file  . Highest education level: Not on file  Occupational History  . Occupation: produce manager/ disability    Employer: [employer]  Social Needs  . Financial resource strain: Not hard at all  . Food insecurity    Worry: Never true    Inability: Never true  . Transportation needs    Medical: No    Non-medical: No  Tobacco Use  . Smoking status: Current Every Day Smoker    Packs/day: 0.50    Years: 44.00    Pack years: 22.00    Types: Cigarettes  . Smokeless tobacco: Current User    Types: Snuff  Substance and Sexual Activity  . Alcohol use: Yes    Alcohol/week: 24.0 standard drinks    Types: 24 Standard drinks or equivalent per week    Comment: social beer  . Drug use: No  . Sexual activity: Yes    Partners: Female  Lifestyle  . Physical activity    Days per week: 0 days    Minutes per session: 0 min  . Stress: Only a little  Relationships  . Social connections    Talks on phone: More than three times a week  Gets together: More than three times a week    Attends religious service: Not on file    Active member of club or organization: Not on file    Attends meetings of clubs or organizations: Not on file    Relationship status: Married  Other Topics Concern  . Not on file  Social History Narrative   Exercise--no   Allergies  Allergen Reactions  . Cefuroxime Axetil     REACTION: hives   Family History  Problem Relation Age of Onset  . Cancer Father 41       colon   . Colon cancer Father 66       pt thinks father had colon cancer  . Stomach cancer Father   . Hypertension Mother   . Colon polyps Brother   . Aneurysm Maternal Aunt 58       brain  . Heart disease Maternal Aunt        d 73  . Cancer Paternal Uncle 30       stomach  . Aneurysm Maternal Grandfather        stomach  . Colon cancer Maternal  Grandfather        thinks grandfather had colon cancer  . Cancer Paternal Grandfather 63       stomach  . Depression Brother 20       suicide  . Cancer Paternal Uncle 74       stomach  . Esophageal cancer Neg Hx   . Rectal cancer Neg Hx   . Diabetes Neg Hx      Current Outpatient Medications (Cardiovascular):  .  atorvastatin (LIPITOR) 10 MG tablet, Take 1 tablet (10 mg total) by mouth daily.  Current Outpatient Medications (Respiratory):  .  albuterol (PROVENTIL HFA;VENTOLIN HFA) 108 (90 Base) MCG/ACT inhaler, Inhale 1-2 puffs into the lungs every 6 (six) hours as needed for wheezing or shortness of breath. .  triamcinolone (NASACORT) 55 MCG/ACT AERO nasal inhaler, Place 2 sprays into the nose daily. .  fluticasone (FLONASE) 50 MCG/ACT nasal spray, Place 1-2 sprays into both nostrils daily for 10 days.  Current Outpatient Medications (Analgesics):  .  ibuprofen (ADVIL,MOTRIN) 600 MG tablet, Take 1 tablet (600 mg total) by mouth every 8 (eight) hours as needed. .  meloxicam (MOBIC) 15 MG tablet, Take 1 tablet (15 mg total) by mouth daily. .  Oxycodone HCl 10 MG TABS, Take 1-2 tablets (10-20 mg total) by mouth every 4 (four) hours as needed (pain).   Current Outpatient Medications (Other):  .  azithromycin (ZITHROMAX) 250 MG tablet, Take 2 tabs PO x 1 dose, then 1 tab PO QD x 4 days .  DULoxetine (CYMBALTA) 30 MG capsule, Take 1 capsule by mouth daily. Marland Kitchen  gabapentin (NEURONTIN) 300 MG capsule, Take 1 capsule (300 mg total) by mouth 3 (three) times daily. Marland Kitchen  tiZANidine (ZANAFLEX) 4 MG capsule, Take 1 capsule by mouth 3 (three) times daily as needed.    Past medical history, social, surgical and family history all reviewed in electronic medical record.  No pertanent information unless stated regarding to the chief complaint.   Review of Systems:  No headache, visual changes, nausea, vomiting, diarrhea, constipation, dizziness, abdominal pain, skin rash, fevers, chills, night  sweats, weight loss, swollen lymph nodes, body aches, joint swelling, muscle aches, chest pain, shortness of breath, mood changes.   Objective  Blood pressure 140/84, pulse (!) 102, height 5\' 11"  (1.803 m), weight 260 lb (117.9 kg), SpO2 95 %. f  General: No apparent distress alert and oriented x3 mood and affect normal, dressed appropriately.  HEENT: Pupils equal, extraocular movements intact  Respiratory: Patient's speak in full sentences and does not appear short of breath  Cardiovascular: No lower extremity edema, non tender, no erythema  Skin: Warm dry intact with no signs of infection or rash on extremities or on axial skeleton.  Abdomen: Soft nontender  Neuro: Cranial nerves II through XII are intact, neurovascularly intact in all extremities with 2+ DTRs and 2+ pulses.  Lymph: No lymphadenopathy of posterior or anterior cervical chain or axillae bilaterally.  Gait antalgic MSK:  tender with full range of motion and good stability and symmetric strength and tone of shoulders, elbows, wrist, hip, knee and bilaterally.  Arthritic changes of multiple joints  Right foot exam shows the patient does have severe breakdown the longitudinal arch with overpronation.  Tenderness to palpation of the medial calcaneal region as well as on the plantar aspect of the foot near the heel.  Tightness of the plantar fascia noted.  Ankle does have mild arthritic changes.  Limited musculoskeletal ultrasound was performed and interpreted by Lyndal Pulley  Limited ultrasound shows the patient does have a large amount of the plantar fascia.  Hypoechoic changes surrounding the area as well.  Possible acute tear noted. Impression: Plantar fasciitis with possible intrasubstance tearing  Procedure: Real-time Ultrasound Guided Injection of right plantar fascia Device: GE Logiq Q7 Ultrasound guided injection is preferred based studies that show increased duration, increased effect, greater accuracy, decreased  procedural pain, increased response rate, and decreased cost with ultrasound guided versus blind injection.  Verbal informed consent obtained.  Time-out conducted.  Noted no overlying erythema, induration, or other signs of local infection.  Skin prepped in a sterile fashion.  Local anesthesia: Topical Ethyl chloride.  With sterile technique and under real time ultrasound guidance: With a 25-gauge 1 inch needle patient was injected with 0.5 cc of 0.5% Marcaine 0.5 cc of Kenalog 40 mg/mL Completed without difficulty  Pain immediately resolved suggesting accurate placement of the medication.  Advised to call if fevers/chills, erythema, induration, drainage, or persistent bleeding.  Images permanently stored and available for review in the ultrasound unit.  Impression: Technically successful ultrasound guided injection.   Impression and Recommendations:     This case required medical decision making of moderate complexity. The above documentation has been reviewed and is accurate and complete Lyndal Pulley, DO       Note: This dictation was prepared with Dragon dictation along with smaller phrase technology. Any transcriptional errors that result from this process are unintentional.

## 2018-11-14 DIAGNOSIS — M5412 Radiculopathy, cervical region: Secondary | ICD-10-CM | POA: Diagnosis not present

## 2018-11-14 DIAGNOSIS — M545 Low back pain: Secondary | ICD-10-CM | POA: Diagnosis not present

## 2018-11-14 DIAGNOSIS — M5416 Radiculopathy, lumbar region: Secondary | ICD-10-CM | POA: Diagnosis not present

## 2018-11-14 DIAGNOSIS — M542 Cervicalgia: Secondary | ICD-10-CM | POA: Diagnosis not present

## 2018-11-16 ENCOUNTER — Other Ambulatory Visit: Payer: Self-pay | Admitting: Internal Medicine

## 2018-11-16 DIAGNOSIS — Z20828 Contact with and (suspected) exposure to other viral communicable diseases: Secondary | ICD-10-CM

## 2018-11-16 DIAGNOSIS — Z20822 Contact with and (suspected) exposure to covid-19: Secondary | ICD-10-CM

## 2018-11-16 MED ORDER — ALBUTEROL SULFATE HFA 108 (90 BASE) MCG/ACT IN AERS: 1.0000 | INHALATION_SPRAY | Freq: Four times a day (QID) | RESPIRATORY_TRACT | 11 refills | Status: DC | PRN

## 2018-11-16 MED ORDER — FLUTICASONE PROPIONATE 50 MCG/ACT NA SUSP: 1.0000 | Freq: Every day | NASAL | 11 refills | Status: DC

## 2018-11-17 ENCOUNTER — Other Ambulatory Visit: Payer: Self-pay

## 2018-11-17 DIAGNOSIS — Z20822 Contact with and (suspected) exposure to covid-19: Secondary | ICD-10-CM

## 2018-11-17 DIAGNOSIS — R6889 Other general symptoms and signs: Secondary | ICD-10-CM | POA: Diagnosis not present

## 2018-11-22 LAB — NOVEL CORONAVIRUS, NAA: SARS-CoV-2, NAA: NOT DETECTED

## 2018-11-23 ENCOUNTER — Telehealth: Payer: Self-pay

## 2018-11-23 NOTE — Telephone Encounter (Signed)
-----   Message from [name], MD sent at 11/23/2018 12:29 PM EDT ----- Left message on MyChart, pt to cont same tx except  The test results show that your current treatment is OK, as the second COVID test is negative.Redmond Baseman to please inform pt,  - COVID is neg

## 2018-11-23 NOTE — Telephone Encounter (Signed)
Pt has been informed of results and expressed understanding.  °

## 2018-11-29 ENCOUNTER — Other Ambulatory Visit: Payer: Self-pay

## 2018-11-29 ENCOUNTER — Ambulatory Visit: Payer: Medicare Other | Admitting: Family Medicine

## 2018-11-29 ENCOUNTER — Encounter: Payer: Self-pay | Admitting: Family Medicine

## 2018-11-29 ENCOUNTER — Ambulatory Visit: Payer: Self-pay

## 2018-11-29 VITALS — BP 106/70 | HR 94 | Ht 71.0 in | Wt 263.0 lb

## 2018-11-29 DIAGNOSIS — M79671 Pain in right foot: Secondary | ICD-10-CM

## 2018-11-29 DIAGNOSIS — M722 Plantar fascial fibromatosis: Secondary | ICD-10-CM | POA: Diagnosis not present

## 2018-11-29 NOTE — Assessment & Plan Note (Signed)
Injected today, discussed over-the-counter orthotics, we discussed the possibility of custom orthotics in the long run.  Patient felt significantly better after the injection today.  Follow-up with me again in 4-8 weeks

## 2018-11-29 NOTE — Progress Notes (Signed)
[name] [address] [place], [place] [address] Phone: [phone] Subjective:   Fontaine No, am serving as a scribe for Dr. Hulan Saas.  I'm seeing this patient by the request  of:    CC: Foot pain follow-up  [id number]   11/01/2018: Patient given injection.  Tolerated procedure well.  Discussed icing regimen and home exercise.  Discussed over-the-counter orthotics would be beneficial.  May need custom orthotics in the long run secondary to the overpronation.  Follow-up again in 4-6  Update 11/29/2018: [name] is a 58 y.o. male coming in with complaint of right heel pain. Patient states that he had 2 weeks of relief from injection. Was having sharp pain in arch of foot. Sharp pain has gone away but still has pain with weight bearing activities especially near the end of the day.       Past Medical History:  Diagnosis Date  . Allergy   . Anxiety   . Arthritis    Back  . Chronic back pain   . Colon polyps   . Depression   . GERD (gastroesophageal reflux disease)    every now and then  . Hyperlipidemia 09/26/2015  . Lumbar surgical wound fluid collection    lumbar wound dehiscence   Past Surgical History:  Procedure Laterality Date  . BACK SURGERY    . COLONOSCOPY    . COLONOSCOPY W/ BIOPSIES AND POLYPECTOMY    . EYE SURGERY    . KNEE ARTHROSCOPY WITH MEDIAL MENISECTOMY Left 01/23/2016   Procedure: KNEE ARTHROSCOPY WITH MEDIAL MENISECTOMY;  Surgeon: Melrose Nakayama, MD;  Location: [facility];  Service: Orthopedics;  Laterality: Left;  . LUMBAR WOUND DEBRIDEMENT N/A 03/21/2014   Procedure: LUMBAR WOUND DEBRIDEMENT;  Surgeon: Eustace Moore, MD;  Location: [facility];  Service: Neurosurgery;  Laterality: N/A;  . MAXIMUM ACCESS (MAS)POSTERIOR LUMBAR INTERBODY FUSION (PLIF) 1 LEVEL N/A 02/14/2014   Procedure: FOR MAXIMUM ACCESS SURGERY POSTERIOR LUMBAR INTERBODY FUSION LUMBAR TWO-THREE;  Surgeon: Eustace Moore,  MD;  Location: [facility];  Service: Neurosurgery;  Laterality: N/A;  . NECK SURGERY    . SHOULDER SURGERY    . SPINE SURGERY    . TONSILLECTOMY     Social History   Socioeconomic History  . Marital status: Married    Spouse name: Not on file  . Number of children: Not on file  . Years of education: Not on file  . Highest education level: Not on file  Occupational History  . Occupation: produce manager/ disability    Employer: [employer]  Social Needs  . Financial resource strain: Not hard at all  . Food insecurity    Worry: Never true    Inability: Never true  . Transportation needs    Medical: No    Non-medical: No  Tobacco Use  . Smoking status: Current Every Day Smoker    Packs/day: 0.50    Years: 44.00    Pack years: 22.00    Types: Cigarettes  . Smokeless tobacco: Current User    Types: Snuff  Substance and Sexual Activity  . Alcohol use: Yes    Alcohol/week: 24.0 standard drinks    Types: 24 Standard drinks or equivalent per week    Comment: social beer  . Drug use: No  . Sexual activity: Yes    Partners: Female  Lifestyle  . Physical activity    Days per week: 0 days  Minutes per session: 0 min  . Stress: Only a little  Relationships  . Social connections    Talks on phone: More than three times a week    Gets together: More than three times a week    Attends religious service: Not on file    Active member of club or organization: Not on file    Attends meetings of clubs or organizations: Not on file    Relationship status: Married  Other Topics Concern  . Not on file  Social History Narrative   Exercise--no   Allergies  Allergen Reactions  . Cefuroxime Axetil     REACTION: hives   Family History  Problem Relation Age of Onset  . Cancer Father 23       colon   . Colon cancer Father 81       pt thinks father had colon cancer  . Stomach cancer Father   . Hypertension Mother   . Colon polyps Brother   . Aneurysm Maternal Aunt 58        brain  . Heart disease Maternal Aunt        d 68  . Cancer Paternal Uncle 40       stomach  . Aneurysm Maternal Grandfather        stomach  . Colon cancer Maternal Grandfather        thinks grandfather had colon cancer  . Cancer Paternal Grandfather 36       stomach  . Depression Brother 20       suicide  . Cancer Paternal Uncle 23       stomach  . Esophageal cancer Neg Hx   . Rectal cancer Neg Hx   . Diabetes Neg Hx      Current Outpatient Medications (Cardiovascular):  .  atorvastatin (LIPITOR) 10 MG tablet, Take 1 tablet (10 mg total) by mouth daily.  Current Outpatient Medications (Respiratory):  .  albuterol (VENTOLIN HFA) 108 (90 Base) MCG/ACT inhaler, Inhale 1-2 puffs into the lungs every 6 (six) hours as needed for wheezing or shortness of breath. .  triamcinolone (NASACORT) 55 MCG/ACT AERO nasal inhaler, Place 2 sprays into the nose daily. .  fluticasone (FLONASE) 50 MCG/ACT nasal spray, Place 1-2 sprays into both nostrils daily for 10 days.  Current Outpatient Medications (Analgesics):  .  ibuprofen (ADVIL,MOTRIN) 600 MG tablet, Take 1 tablet (600 mg total) by mouth every 8 (eight) hours as needed. .  meloxicam (MOBIC) 15 MG tablet, Take 1 tablet (15 mg total) by mouth daily. .  Oxycodone HCl 10 MG TABS, Take 1-2 tablets (10-20 mg total) by mouth every 4 (four) hours as needed (pain).   Current Outpatient Medications (Other):  .  azithromycin (ZITHROMAX) 250 MG tablet, Take 2 tabs PO x 1 dose, then 1 tab PO QD x 4 days .  DULoxetine (CYMBALTA) 30 MG capsule, Take 1 capsule by mouth daily. Marland Kitchen  gabapentin (NEURONTIN) 300 MG capsule, Take 1 capsule (300 mg total) by mouth 3 (three) times daily. Marland Kitchen  tiZANidine (ZANAFLEX) 4 MG capsule, Take 1 capsule by mouth 3 (three) times daily as needed.    Past medical history, social, surgical and family history all reviewed in electronic medical record.  No pertanent information unless stated regarding to the chief complaint.    Review of Systems:  No headache, visual changes, nausea, vomiting, diarrhea, constipation, dizziness, abdominal pain, skin rash, fevers, chills, night sweats, weight loss, swollen lymph nodes, body aches, joint swelling, muscle aches,  chest pain, shortness of breath, mood changes.   Objective  Blood pressure 106/70, pulse 94, height 5\' 11"  (1.803 m), weight 263 lb (119.3 kg), SpO2 97 %.    General: No apparent distress alert and oriented x3 mood and affect normal, dressed appropriately.  HEENT: Pupils equal, extraocular movements intact  Respiratory: Patient's speak in full sentences and does not appear short of breath  Cardiovascular: No lower extremity edema, non tender, no erythema  Skin: Warm dry intact with no signs of infection or rash on extremities or on axial skeleton.  Abdomen: Soft nontender  Neuro: Cranial nerves II through XII are intact, neurovascularly intact in all extremities with 2+ DTRs and 2+ pulses.  Lymph: No lymphadenopathy of posterior or anterior cervical chain or axillae bilaterally.  Gait normal with good balance and coordination.  MSK:  tender with full range of motion and good stability and symmetric strength and tone of shoulders, elbows, wrist, hip, knee and ankles bilaterally.  Patient's right foot still has severe tenderness to palpation over the medial calcaneal area.  Patient does have more of a fullness no noted.  Less swelling than previous exam.  Still some tightness of the posterior cords of the ankle.  Limited musculoskeletal ultrasound was performed and interpreted by Lyndal Pulley  Limited ultrasound of patient's right foot in the calcaneal region shows the patient's plantar fascia seems to be significantly less hypoechoic changes but now noted a fibroma approximately 1 cm from the origin aspect calcaneal area.  With compression patient has discomfort in that area.  No sign of any infectious etiology. Impression: Fibroma foot  Procedure: Real-time  Ultrasound Guided Injection of right heel fibroma Device: GE Logiq Q7 Ultrasound guided injection is preferred based studies that show increased duration, increased effect, greater accuracy, decreased procedural pain, increased response rate, and decreased cost with ultrasound guided versus blind injection.  Verbal informed consent obtained.  Time-out conducted.  Noted no overlying erythema, induration, or other signs of local infection.  Skin prepped in a sterile fashion.  Local anesthesia: Topical Ethyl chloride.  With sterile technique and under real time ultrasound guidance: With a 21-gauge 2 inch needle was injected with 0.5 cc of 0.5% Marcaine and 0.5 cc of Kenalog 40 mg/mL Completed without difficulty  Pain immediately resolved suggesting accurate placement of the medication.  Advised to call if fevers/chills, erythema, induration, drainage, or persistent bleeding.  Images permanently stored and available for review in the ultrasound unit.  Impression: Technically successful ultrasound guided injection.   Impression and Recommendations:     This case required medical decision making of moderate complexity. The above documentation has been reviewed and is accurate and complete Lyndal Pulley, DO       Note: This dictation was prepared with Dragon dictation along with smaller phrase technology. Any transcriptional errors that result from this process are unintentional.

## 2018-11-29 NOTE — Patient Instructions (Addendum)
Good to see you  [name] is your friend Stay active but do not walk barefoot  You know the exercises but start again on Monday  See me again in 4ish weeks

## 2019-01-02 ENCOUNTER — Ambulatory Visit: Payer: Medicare Other | Admitting: Family Medicine

## 2019-01-22 DIAGNOSIS — M5416 Radiculopathy, lumbar region: Secondary | ICD-10-CM | POA: Diagnosis not present

## 2019-02-19 DIAGNOSIS — M545 Low back pain: Secondary | ICD-10-CM | POA: Diagnosis not present

## 2019-02-19 DIAGNOSIS — R030 Elevated blood-pressure reading, without diagnosis of hypertension: Secondary | ICD-10-CM | POA: Diagnosis not present

## 2019-02-19 DIAGNOSIS — M5416 Radiculopathy, lumbar region: Secondary | ICD-10-CM | POA: Diagnosis not present

## 2019-02-19 DIAGNOSIS — M5412 Radiculopathy, cervical region: Secondary | ICD-10-CM | POA: Diagnosis not present

## 2019-02-19 DIAGNOSIS — M542 Cervicalgia: Secondary | ICD-10-CM | POA: Diagnosis not present

## 2019-04-03 ENCOUNTER — Other Ambulatory Visit: Payer: Self-pay

## 2019-04-03 ENCOUNTER — Ambulatory Visit: Payer: Self-pay

## 2019-04-03 ENCOUNTER — Encounter: Payer: Self-pay | Admitting: Family Medicine

## 2019-04-03 ENCOUNTER — Ambulatory Visit (INDEPENDENT_AMBULATORY_CARE_PROVIDER_SITE_OTHER): Payer: Medicare Other | Admitting: Family Medicine

## 2019-04-03 VITALS — BP 144/90 | HR 91 | Ht 71.0 in | Wt 269.0 lb

## 2019-04-03 DIAGNOSIS — M25562 Pain in left knee: Secondary | ICD-10-CM

## 2019-04-03 DIAGNOSIS — M722 Plantar fascial fibromatosis: Secondary | ICD-10-CM | POA: Diagnosis not present

## 2019-04-03 DIAGNOSIS — G8929 Other chronic pain: Secondary | ICD-10-CM

## 2019-04-03 DIAGNOSIS — M216X1 Other acquired deformities of right foot: Secondary | ICD-10-CM | POA: Diagnosis not present

## 2019-04-03 DIAGNOSIS — M79671 Pain in right foot: Secondary | ICD-10-CM | POA: Diagnosis not present

## 2019-04-03 DIAGNOSIS — M25559 Pain in unspecified hip: Secondary | ICD-10-CM | POA: Insufficient documentation

## 2019-04-03 DIAGNOSIS — M25561 Pain in right knee: Secondary | ICD-10-CM | POA: Diagnosis not present

## 2019-04-03 DIAGNOSIS — M216X2 Other acquired deformities of left foot: Secondary | ICD-10-CM

## 2019-04-03 NOTE — Progress Notes (Signed)
[name] [address] [place], [place] [address] Phone: [phone] Subjective:   I [name] am serving as a Education administrator for Dr. Hulan [name].  This visit occurred during the SARS-CoV-2 public health emergency.  Safety protocols were in place, including screening questions prior to the visit, additional usage of staff PPE, and extensive cleaning of exam room while observing appropriate contact time as indicated for disinfecting solutions.    CC: Bilateral knee and foot pain  [id number]   11/29/2018 Injected today, discussed over-the-counter orthotics, we discussed the possibility of custom orthotics in the long run.  Patient felt significantly better after the injection today.  Follow-up with me again in 4-8 weeks  04/03/2019 [name] is a 58 y.o. male coming in with complaint of right foot and bilateral knee pain. Has been seen for the left knee. The foot is a little painful. Mainly knee pain today. With walking the knees feel like they want to give out.   Onset- chronic Location - bilateral knees  Character- achy Aggravating factors- Reliving factors-  Therapies tried-  Severity-7 out of 10     Past Medical History:  Diagnosis Date  . Allergy   . Anxiety   . Arthritis    Back  . Chronic back pain   . Colon polyps   . Depression   . GERD (gastroesophageal reflux disease)    every now and then  . Hyperlipidemia 09/26/2015  . Lumbar surgical wound fluid collection    lumbar wound dehiscence   Past Surgical History:  Procedure Laterality Date  . BACK SURGERY    . COLONOSCOPY    . COLONOSCOPY W/ BIOPSIES AND POLYPECTOMY    . EYE SURGERY    . KNEE ARTHROSCOPY WITH MEDIAL MENISECTOMY Left 01/23/2016   Procedure: KNEE ARTHROSCOPY WITH MEDIAL MENISECTOMY;  Surgeon: Melrose Nakayama, MD;  Location: [facility];  Service: Orthopedics;  Laterality: Left;  . LUMBAR WOUND DEBRIDEMENT N/A 03/21/2014   Procedure: LUMBAR  WOUND DEBRIDEMENT;  Surgeon: Eustace Moore, MD;  Location: [facility];  Service: Neurosurgery;  Laterality: N/A;  . MAXIMUM ACCESS (MAS)POSTERIOR LUMBAR INTERBODY FUSION (PLIF) 1 LEVEL N/A 02/14/2014   Procedure: FOR MAXIMUM ACCESS SURGERY POSTERIOR LUMBAR INTERBODY FUSION LUMBAR TWO-THREE;  Surgeon: Eustace Moore, MD;  Location: [facility];  Service: Neurosurgery;  Laterality: N/A;  . NECK SURGERY    . SHOULDER SURGERY    . SPINE SURGERY    . TONSILLECTOMY     Social History   Socioeconomic History  . Marital status: Married    Spouse name: Not on file  . Number of children: Not on file  . Years of education: Not on file  . Highest education level: Not on file  Occupational History  . Occupation: produce manager/ disability    Employer: [employer]  Social Needs  . Financial resource strain: Not hard at all  . Food insecurity    Worry: Never true    Inability: Never true  . Transportation needs    Medical: No    Non-medical: No  Tobacco Use  . Smoking status: Current Every Day Smoker    Packs/day: 0.50    Years: 44.00    Pack years: 22.00    Types: Cigarettes  . Smokeless tobacco: Current User    Types: Snuff  Substance and Sexual Activity  . Alcohol use: Yes    Alcohol/week: 24.0 standard drinks    Types: 24 Standard drinks  or equivalent per week    Comment: social beer  . Drug use: No  . Sexual activity: Yes    Partners: Female  Lifestyle  . Physical activity    Days per week: 0 days    Minutes per session: 0 min  . Stress: Only a little  Relationships  . Social connections    Talks on phone: More than three times a week    Gets together: More than three times a week    Attends religious service: Not on file    Active member of club or organization: Not on file    Attends meetings of clubs or organizations: Not on file    Relationship status: Married  Other Topics Concern  . Not on file  Social History Narrative   Exercise--no   Allergies   Allergen Reactions  . Cefuroxime Axetil     REACTION: hives   Family History  Problem Relation Age of Onset  . Cancer Father 46       colon   . Colon cancer Father 50       pt thinks father had colon cancer  . Stomach cancer Father   . Hypertension Mother   . Colon polyps Brother   . Aneurysm Maternal Aunt 58       brain  . Heart disease Maternal Aunt        d 20  . Cancer Paternal Uncle 67       stomach  . Aneurysm Maternal Grandfather        stomach  . Colon cancer Maternal Grandfather        thinks grandfather had colon cancer  . Cancer Paternal Grandfather 29       stomach  . Depression Brother 20       suicide  . Cancer Paternal Uncle 105       stomach  . Esophageal cancer Neg Hx   . Rectal cancer Neg Hx   . Diabetes Neg Hx      Current Outpatient Medications (Cardiovascular):  .  atorvastatin (LIPITOR) 10 MG tablet, Take 1 tablet (10 mg total) by mouth daily.  Current Outpatient Medications (Respiratory):  .  albuterol (VENTOLIN HFA) 108 (90 Base) MCG/ACT inhaler, Inhale 1-2 puffs into the lungs every 6 (six) hours as needed for wheezing or shortness of breath. .  triamcinolone (NASACORT) 55 MCG/ACT AERO nasal inhaler, Place 2 sprays into the nose daily. .  fluticasone (FLONASE) 50 MCG/ACT nasal spray, Place 1-2 sprays into both nostrils daily for 10 days.  Current Outpatient Medications (Analgesics):  .  ibuprofen (ADVIL,MOTRIN) 600 MG tablet, Take 1 tablet (600 mg total) by mouth every 8 (eight) hours as needed. .  meloxicam (MOBIC) 15 MG tablet, Take 1 tablet (15 mg total) by mouth daily. .  Oxycodone HCl 10 MG TABS, Take 1-2 tablets (10-20 mg total) by mouth every 4 (four) hours as needed (pain).   Current Outpatient Medications (Other):  .  azithromycin (ZITHROMAX) 250 MG tablet, Take 2 tabs PO x 1 dose, then 1 tab PO QD x 4 days .  DULoxetine (CYMBALTA) 30 MG capsule, Take 1 capsule by mouth daily. Marland Kitchen  gabapentin (NEURONTIN) 300 MG capsule, Take 1  capsule (300 mg total) by mouth 3 (three) times daily. Marland Kitchen  tiZANidine (ZANAFLEX) 4 MG capsule, Take 1 capsule by mouth 3 (three) times daily as needed.    Past medical history, social, surgical and family history all reviewed in electronic medical record.  No pertanent  information unless stated regarding to the chief complaint.   Review of Systems:  No headache, visual changes, nausea, vomiting, diarrhea, constipation, dizziness, abdominal pain, skin rash, fevers, chills, night sweats, weight loss, swollen lymph nodes, body aches, joint swelling, , chest pain, shortness of breath, mood changes.  Positive muscle aches  Objective  Blood pressure (!) 144/90, pulse 91, height 5\' 11"  (1.803 m), weight 269 lb (122 kg), SpO2 93 %.    General: No apparent distress alert and oriented x3 mood and affect normal, dressed appropriately.  HEENT: Pupils equal, extraocular movements intact  Respiratory: Patient's speak in full sentences and does not appear short of breath  Cardiovascular: No lower extremity edema, non tender, no erythema  Skin: Warm dry intact with no signs of infection or rash on extremities or on axial skeleton.  Abdomen: Soft nontender  Neuro: Cranial nerves II through XII are intact, neurovascularly intact in all extremities with 2+ DTRs and 2+ pulses.  Lymph: No lymphadenopathy of posterior or anterior cervical chain or axillae bilaterally.  Gait antalgic MSK:  tender with full range of motion and good stability and symmetric strength and tone of shoulders, elbows, wrist, hip and ankles bilaterally.  Patient's knees bilaterally does have a positive patella crepitus.  Mild lateral tracking of the kneecap noted bilaterally.  Mild instability with varus force.  Right foot exam still has pes planus noted.  Patient does have severe tenderness over the calcaneal region more on the plantar aspect.  No large nodules are noted.  After informed written and verbal consent, patient was seated on  exam table. Right knee was prepped with alcohol swab and utilizing anterolateral approach, patient's right knee space was injected with 4:1  marcaine 0.5%: Kenalog 40mg /dL. Patient tolerated the procedure well without immediate complications.  After informed written and verbal consent, patient was seated on exam table. Left knee was prepped with alcohol swab and utilizing anterolateral approach, patient's left knee space was injected with 4:1  marcaine 0.5%: Kenalog 40mg /dL. Patient tolerated the procedure well without immediate complications.  Procedure: Real-time Ultrasound Guided Injection of plantar fibroma Device: GE Logiq Q7 Ultrasound guided injection is preferred based studies that show increased duration, increased effect, greater accuracy, decreased procedural pain, increased response rate, and decreased cost with ultrasound guided versus blind injection.  Verbal informed consent obtained.  Time-out conducted.  Noted no overlying erythema, induration, or other signs of local infection.  Skin prepped in a sterile fashion.  Local anesthesia: Topical Ethyl chloride.  With sterile technique and under real time ultrasound guidance: With a 25-gauge 1 inch needle injected into the fibroma noted in the plantar aspect of the foot with 0.5 cc of 0.5% Marcaine and 0.5 cc of Kenalog 40 mg/mL Completed without difficulty  Pain immediately resolved suggesting accurate placement of the medication.  Advised to call if fevers/chills, erythema, induration, drainage, or persistent bleeding.  Images permanently stored and available for review in the ultrasound unit.  Impression: Technically successful ultrasound guided injection.   Impression and Recommendations:     This case required medical decision making of moderate complexity. The above documentation has been reviewed and is accurate and complete Lyndal Pulley, DO       Note: This dictation was prepared with Dragon dictation along with  smaller phrase technology. Any transcriptional errors that result from this process are unintentional.

## 2019-04-03 NOTE — Patient Instructions (Addendum)
Injected both knees and foot today Happy New Years See me in 2 months    [address], [address] floor [place], [place] [address] Phone [phone]

## 2019-04-03 NOTE — Assessment & Plan Note (Signed)
Bilateral injections given today.  Discussed topical anti-inflammatories, icing regimen, home exercises.  Patient could be a candidate for possible viscosupplementation but not likely will do well with conservative therapy first.  Follow-up again in 4 to 8 weeks

## 2019-04-03 NOTE — Assessment & Plan Note (Signed)
Patient given injection today, tolerated the procedure well, discussed icing regimen and home exercise, discussed which activities to do which wants to avoid.  Discussed proper shoes and over-the-counter orthotics as well as the custom orthotics patient has had previously.  Do not believe the patient needs surgical intervention and will continue to do well with the intermittent injections.

## 2019-04-23 DIAGNOSIS — M5416 Radiculopathy, lumbar region: Secondary | ICD-10-CM | POA: Diagnosis not present

## 2019-05-17 DIAGNOSIS — M545 Low back pain: Secondary | ICD-10-CM | POA: Diagnosis not present

## 2019-05-17 DIAGNOSIS — M542 Cervicalgia: Secondary | ICD-10-CM | POA: Insufficient documentation

## 2019-07-13 DIAGNOSIS — Z03818 Encounter for observation for suspected exposure to other biological agents ruled out: Secondary | ICD-10-CM | POA: Diagnosis not present

## 2019-07-14 ENCOUNTER — Ambulatory Visit
Admission: EM | Admit: 2019-07-14 | Discharge: 2019-07-14 | Disposition: A | Discharge status: Home / Self Care | Payer: Medicare Other | Attending: Emergency Medicine | Admitting: Emergency Medicine

## 2019-07-14 ENCOUNTER — Other Ambulatory Visit: Payer: Self-pay

## 2019-07-14 ENCOUNTER — Encounter: Payer: Self-pay | Admitting: Emergency Medicine

## 2019-07-14 DIAGNOSIS — L02416 Cutaneous abscess of left lower limb: Secondary | ICD-10-CM | POA: Diagnosis not present

## 2019-07-14 MED ORDER — DOXYCYCLINE HYCLATE 100 MG PO CAPS: 100.0000 mg | ORAL_CAPSULE | Freq: Two times a day (BID) | ORAL | 0 refills | Status: AC

## 2019-07-14 NOTE — ED Provider Notes (Signed)
[name]    CSN: [id number] Arrival date & time: 07/14/19  1510      History   Chief Complaint Chief Complaint  Patient presents with  . Mass    HPI [name] is a 59 y.o. male with history of chronic low back pain presenting for evaluation of mass to left medial thigh. States has been swollen there for 1 month, though has noticed it becoming more swollen, painful over the last week. Patient states that he was a warm compress today and squeezed it: Was able to express a few drops of blood. Now endorsing more pain and swelling. Patient is not sexually active: Denies history of STD. No penile or testicular pain, swelling, penile discharge, change in urination or bowel habits. No fever, chills, arthralgias or myalgias.    Past Medical History:  Diagnosis Date  . Allergy   . Anxiety   . Arthritis    Back  . Chronic back pain   . Colon polyps   . Depression   . GERD (gastroesophageal reflux disease)    every now and then  . Hyperlipidemia 09/26/2015  . Lumbar surgical wound fluid collection    lumbar wound dehiscence    Patient Active Problem List   Diagnosis Date Noted  . Bilateral chronic knee pain 04/03/2019  . Fibromatosis of plantar fascia 11/29/2018  . Pronation deformity of both feet 11/01/2018  . 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  . Plantar fasciitis 09/27/2018  . Left hip pain 09/27/2018  . Microhematuria 09/27/2018  . Chronic cough 09/27/2018  . Abnormal breath sounds 09/27/2018  . Alcohol abuse 09/27/2018  . Left otitis media 05/23/2018  . Upper respiratory tract infection 04/21/2017  . Low testosterone 12/10/2015  . Polymyalgia ([place]) 11/11/2015  . Chronic low back pain 10/30/2015  . Easy bruising 10/30/2015  . Bursitis of right shoulder 09/30/2015  . Hyperglycemia 09/26/2015  . Hyperlipidemia 09/26/2015  . History of colonic polyps 09/26/2015  . Acute medial meniscal tear 09/09/2015  . Multiple bruises 08/28/2015  . Left knee  pain 08/28/2015  . S/P lumbar spinal fusion 02/14/2014  . Obesity (BMI 30-39.9) 07/12/2013  . OA (osteoarthritis) 03/07/2012  . Fatigue 03/07/2012  . Encounter for well adult exam with abnormal findings 09/03/2010  . Smoker 09/03/2010    Past Surgical History:  Procedure Laterality Date  . BACK SURGERY    . COLONOSCOPY    . COLONOSCOPY W/ BIOPSIES AND POLYPECTOMY    . EYE SURGERY    . KNEE ARTHROSCOPY WITH MEDIAL MENISECTOMY Left 01/23/2016   Procedure: KNEE ARTHROSCOPY WITH MEDIAL MENISECTOMY;  Surgeon: Melrose Nakayama, MD;  Location: [facility];  Service: Orthopedics;  Laterality: Left;  . LUMBAR WOUND DEBRIDEMENT N/A 03/21/2014   Procedure: LUMBAR WOUND DEBRIDEMENT;  Surgeon: Eustace Moore, MD;  Location: [facility];  Service: Neurosurgery;  Laterality: N/A;  . MAXIMUM ACCESS (MAS)POSTERIOR LUMBAR INTERBODY FUSION (PLIF) 1 LEVEL N/A 02/14/2014   Procedure: FOR MAXIMUM ACCESS SURGERY POSTERIOR LUMBAR INTERBODY FUSION LUMBAR TWO-THREE;  Surgeon: Eustace Moore, MD;  Location: [facility];  Service: Neurosurgery;  Laterality: N/A;  . NECK SURGERY    . SHOULDER SURGERY    . SPINE SURGERY    . TONSILLECTOMY         Home Medications    Prior to Admission medications   Medication Sig Start Date End Date Taking? Authorizing Provider  albuterol (VENTOLIN HFA) 108 (90 Base) MCG/ACT inhaler Inhale 1-2 puffs into the lungs every 6 (six)  hours as needed for wheezing or shortness of breath. 11/16/18   [name], MD  atorvastatin (LIPITOR) 10 MG tablet Take 1 tablet (10 mg total) by mouth daily. 09/27/18   [name], MD  doxycycline (VIBRAMYCIN) 100 MG capsule Take 1 capsule (100 mg total) by mouth 2 (two) times daily for 7 days. 07/14/19 07/21/19  Hall-Potvin, [place], PA-C  DULoxetine (CYMBALTA) 30 MG capsule Take 1 capsule by mouth daily. 11/05/14   [provider]  fluticasone (FLONASE) 50 MCG/ACT nasal spray Place 1-2 sprays into both nostrils daily for 10  days. 11/16/18 11/26/18  [name], MD  gabapentin (NEURONTIN) 300 MG capsule Take 1 capsule (300 mg total) by mouth 3 (three) times daily. 04/21/17   Rosemarie Ax, MD  ibuprofen (ADVIL,MOTRIN) 600 MG tablet Take 1 tablet (600 mg total) by mouth every 8 (eight) hours as needed. 07/13/18   Wieters, Hallie C, PA-C  meloxicam (MOBIC) 15 MG tablet Take 1 tablet (15 mg total) by mouth daily. 09/27/18   [name], MD  Oxycodone HCl 10 MG TABS Take 1-2 tablets (10-20 mg total) by mouth every 4 (four) hours as needed (pain). 01/23/16   Loni Dolly, PA-C  tiZANidine (ZANAFLEX) 4 MG capsule Take 1 capsule by mouth 3 (three) times daily as needed. 11/05/14   [provider]  triamcinolone (NASACORT) 55 MCG/ACT AERO nasal inhaler Place 2 sprays into the nose daily. 05/23/18   [name], MD    Family History Family History  Problem Relation Age of Onset  . Cancer Father 80       colon   . Colon cancer Father 85       pt thinks father had colon cancer  . Stomach cancer Father   . Hypertension Mother   . Colon polyps Brother   . Aneurysm Maternal Aunt 58       brain  . Heart disease Maternal Aunt        d 15  . Cancer Paternal Uncle 57       stomach  . Aneurysm Maternal Grandfather        stomach  . Colon cancer Maternal Grandfather        thinks grandfather had colon cancer  . Cancer Paternal Grandfather 67       stomach  . Depression Brother 20       suicide  . Cancer Paternal Uncle 59       stomach  . Esophageal cancer Neg Hx   . Rectal cancer Neg Hx   . Diabetes Neg Hx     Social History Social History   Tobacco Use  . Smoking status: Current Every Day Smoker    Packs/day: 0.50    Years: 44.00    Pack years: 22.00    Types: Cigarettes  . Smokeless tobacco: Current User    Types: Snuff  Substance Use Topics  . Alcohol use: Yes    Alcohol/week: 24.0 standard drinks    Types: 24 Standard drinks or equivalent per week    Comment: social beer  . Drug use: No      Allergies   Cefuroxime axetil   Review of Systems As per HPI   Physical Exam Triage Vital Signs ED Triage Vitals  Enc Vitals Group     BP      Pulse      Resp      Temp      Temp src  SpO2      Weight      Height      Head Circumference      Peak Flow      Pain Score      Pain Loc      Pain Edu?      Excl. in [place]?    No data found.  Updated Vital Signs BP (!) 166/94 (BP Location: Left Arm)   Pulse (!) 109   Temp (!) 97.2 F (36.2 C) (Temporal)   Resp 16   SpO2 95%   Visual Acuity Right Eye Distance:   Left Eye Distance:   Bilateral Distance:    Right Eye Near:   Left Eye Near:    Bilateral Near:     Physical Exam Constitutional:      General: He is not in acute distress. HENT:     Head: Normocephalic and atraumatic.  Eyes:     General: No scleral icterus.    Pupils: Pupils are equal, round, and reactive to light.  Cardiovascular:     Rate and Rhythm: Normal rate.  Pulmonary:     Effort: Pulmonary effort is normal. No respiratory distress.     Breath sounds: No wheezing.  Skin:    Coloration: Skin is not jaundiced or pale.     Comments: Left supramedial thigh 1.5 cm area of erythema, tenderness, induration. Minimal central fluctuance. Patient does have superficial bruising within lesion. No active discharge  Neurological:     Mental Status: He is alert and oriented to person, place, and time.      UC Treatments / Results  Labs (all labs ordered are listed, but only abnormal results are displayed) Labs Reviewed - No data to display  EKG   Radiology No results found.  Procedures Incision and Drainage  Date/Time: 07/14/2019 4:00 PM Performed by: Quincy Sheehan, PA-C Authorized by: Quincy Sheehan, PA-C   Consent:    Consent obtained:  Verbal   Consent given by:  Patient   Risks discussed:  Bleeding, incomplete drainage, pain and damage to other organs   Alternatives discussed:  No treatment Universal  protocol:    Patient identity confirmed:  Verbally with patient Location:    Type:  Abscess   Size:  1.5 cm   Location:  Lower extremity   Lower extremity location:  Leg   Leg location:  L upper leg Pre-procedure details:    Skin preparation:  Betadine Anesthesia (see MAR for exact dosages):    Anesthesia method:  Local infiltration   Local anesthetic:  Lidocaine 2% w/o epi Procedure type:    Complexity:  Simple Procedure details:    Needle aspiration: no     Incision types:  Stab incision   Incision depth:  Subcutaneous   Scalpel blade:  11   Wound management:  Probed and deloculated   Drainage:  Bloody   Drainage amount:  Scant   Wound treatment:  Wound left open   Packing materials:  None Post-procedure details:    Patient tolerance of procedure:  Tolerated well, no immediate complications   (including critical care time)  Medications Ordered in UC Medications - No data to display  Initial Impression / Assessment and Plan / UC Course  I have reviewed the triage vital signs and the nursing notes.  Pertinent labs & imaging results that were available during my care of the patient were reviewed by me and considered in my medical decision making (see chart for details).  Patient febrile, nontoxic in office today. I&D attempted without purulent discharge. Encouraged hot soaks/compresses and will prescribe doxycycline. Return precautions discussed, patient verbalized understanding and is agreeable to plan. Final Clinical Impressions(s) / UC Diagnoses   Final diagnoses:  Abscess of left thigh     Discharge Instructions     Keep area(s) clean and dry. Apply hot compress / towel for 5-10 minutes 3-5 times daily. Take antibiotic as prescribed with food - important to complete course. Return for worsening pain, redness, swelling, discharge, fever.    ED Prescriptions    Medication Sig Dispense Auth. Provider   doxycycline (VIBRAMYCIN) 100 MG capsule Take 1  capsule (100 mg total) by mouth 2 (two) times daily for 7 days. 14 capsule Hall-Potvin, [place], PA-C     PDMP not reviewed this encounter.   Hall-Potvin, [place], [place] 07/14/19 1601

## 2019-07-14 NOTE — ED Triage Notes (Signed)
Pt presents to [facility] for assessment of 1 month of mass to the thigh close to his left testicle.  Patient states it has gotten worse and more inflamed in the past week.  States he used a warm compress on it today and squeezed it, got a few drops of blood out, but now it is more painful and inflamed.

## 2019-07-14 NOTE — ED Notes (Signed)
Patient able to ambulate independently  

## 2019-07-14 NOTE — Discharge Instructions (Addendum)
Keep area(s) clean and dry. Apply hot compress / towel for 5-10 minutes 3-5 times daily. Take antibiotic as prescribed with food - important to complete course. Return for worsening pain, redness, swelling, discharge, fever 

## 2019-07-23 ENCOUNTER — Encounter: Payer: Self-pay | Admitting: Gastroenterology

## 2019-07-23 DIAGNOSIS — M5416 Radiculopathy, lumbar region: Secondary | ICD-10-CM | POA: Diagnosis not present

## 2019-07-31 DIAGNOSIS — M545 Low back pain: Secondary | ICD-10-CM | POA: Diagnosis not present

## 2019-08-20 DIAGNOSIS — M545 Low back pain: Secondary | ICD-10-CM | POA: Diagnosis not present

## 2019-08-20 DIAGNOSIS — M5412 Radiculopathy, cervical region: Secondary | ICD-10-CM | POA: Diagnosis not present

## 2019-08-20 DIAGNOSIS — M5416 Radiculopathy, lumbar region: Secondary | ICD-10-CM | POA: Diagnosis not present

## 2019-08-20 DIAGNOSIS — R030 Elevated blood-pressure reading, without diagnosis of hypertension: Secondary | ICD-10-CM | POA: Insufficient documentation

## 2019-08-20 DIAGNOSIS — M542 Cervicalgia: Secondary | ICD-10-CM | POA: Diagnosis not present

## 2019-08-28 DIAGNOSIS — M5416 Radiculopathy, lumbar region: Secondary | ICD-10-CM | POA: Diagnosis not present

## 2019-08-28 DIAGNOSIS — M545 Low back pain: Secondary | ICD-10-CM | POA: Diagnosis not present

## 2019-08-29 ENCOUNTER — Other Ambulatory Visit: Payer: Self-pay | Admitting: Neurological Surgery

## 2019-09-05 NOTE — Progress Notes (Signed)
[id number] [address], [place] [address] [phone] [phone]      Your procedure is scheduled on Monday, Sep 10, 2019.  Report to [facility] Main Entrance "A" at 10:15 A.M., and check in at the Admitting office.  Call this number if you have problems the morning of surgery:  [phone]  Call [phone] if you have any questions prior to your surgery date Monday-Friday 8am-4pm    Remember:  Do not eat or drink after midnight the night before your surgery     Take these medicines the morning of surgery with A SIP OF WATER:   DULoxetine (CYMBALTA) tiZANidine (ZANAFLEX)  If needed: albuterol (VENTOLIN HFA) fluticasone (FLONASE)  Oxycodone HCl   As of today, STOP taking any Aspirin (unless otherwise instructed by your surgeon) and Aspirin containing products, Aleve, Naproxen, Ibuprofen, Motrin, Advil, Goody's, BC's, all herbal medications, fish oil, and all vitamins.                      Do not wear jewelry.            Do not wear lotions, powders, colognes, or deodorant.            Men may shave face and neck.            Do not bring valuables to the hospital.            [facility] is not responsible for any belongings or valuables.  Do NOT Smoke (Tobacco/Vapping) or drink Alcohol 24 hours prior to your procedure If you use a CPAP at night, you may bring all equipment for your overnight stay.   Contacts, glasses, dentures or bridgework may not be worn into surgery.      For patients admitted to the hospital, discharge time will be determined by your treatment team.   Patients discharged the day of surgery will not be allowed to drive home, and someone needs to stay with them for 24 hours.    Special instructions:   [facility]- Preparing For Surgery  Before surgery, you can play an important role. Because skin is not sterile, your skin needs to be as free of germs as possible. You can  reduce the number of germs on your skin by washing with CHG (chlorahexidine gluconate) Soap before surgery.  CHG is an antiseptic cleaner which kills germs and bonds with the skin to continue killing germs even after washing.    Oral Hygiene is also important to reduce your risk of infection.  Remember - BRUSH YOUR TEETH THE MORNING OF SURGERY WITH YOUR REGULAR TOOTHPASTE  Please do not use if you have an allergy to CHG or antibacterial soaps. If your skin becomes reddened/irritated stop using the CHG.  Do not shave (including legs and underarms) for at least 48 hours prior to first CHG shower. It is OK to shave your face.  Please follow these instructions carefully.   1. Shower the NIGHT BEFORE SURGERY and the MORNING OF SURGERY with CHG Soap.   2. If you chose to wash your hair, wash your hair first as usual with your normal shampoo.  3. After you shampoo, rinse your hair and body thoroughly to remove the shampoo.  4. Use CHG as you would any other liquid soap. You can apply CHG directly to the skin and wash gently with a scrungie or a clean washcloth.   5. Apply the CHG Soap  to your body ONLY FROM THE NECK DOWN.  Do not use on open wounds or open sores. Avoid contact with your eyes, ears, mouth and genitals (private parts). Wash Face and genitals (private parts)  with your normal soap.   6. Wash thoroughly, paying special attention to the area where your surgery will be performed.  7. Thoroughly rinse your body with warm water from the neck down.  8. DO NOT shower/wash with your normal soap after using and rinsing off the CHG Soap.  9. Pat yourself dry with a CLEAN TOWEL.  10. Wear CLEAN PAJAMAS to bed the night before surgery, wear comfortable clothes the morning of surgery  11. Place CLEAN SHEETS on your bed the night of your first shower and DO NOT SLEEP WITH PETS.   Day of Surgery:   Do not apply any deodorants/lotions.  Please wear clean clothes to the hospital/surgery  center.   Remember to brush your teeth WITH YOUR REGULAR TOOTHPASTE.   Please read over the following fact sheets that you were given.

## 2019-09-06 ENCOUNTER — Encounter (HOSPITAL_COMMUNITY)
Admission: RE | Admit: 2019-09-06 | Discharge: 2019-09-06 | Disposition: A | Discharge status: Home / Self Care | Payer: Medicare Other | Source: Ambulatory Visit | Attending: Neurological Surgery | Admitting: Neurological Surgery

## 2019-09-06 ENCOUNTER — Other Ambulatory Visit: Payer: Self-pay

## 2019-09-06 ENCOUNTER — Other Ambulatory Visit (HOSPITAL_COMMUNITY)
Admission: RE | Admit: 2019-09-06 | Discharge: 2019-09-06 | Disposition: A | Discharge status: Home / Self Care | Payer: Medicare Other | Source: Ambulatory Visit | Attending: Neurological Surgery | Admitting: Neurological Surgery

## 2019-09-06 ENCOUNTER — Encounter (HOSPITAL_COMMUNITY): Payer: Self-pay

## 2019-09-06 DIAGNOSIS — Z01818 Encounter for other preprocedural examination: Secondary | ICD-10-CM | POA: Diagnosis not present

## 2019-09-06 DIAGNOSIS — F17210 Nicotine dependence, cigarettes, uncomplicated: Secondary | ICD-10-CM | POA: Insufficient documentation

## 2019-09-06 DIAGNOSIS — M48061 Spinal stenosis, lumbar region without neurogenic claudication: Secondary | ICD-10-CM | POA: Diagnosis not present

## 2019-09-06 DIAGNOSIS — E785 Hyperlipidemia, unspecified: Secondary | ICD-10-CM | POA: Diagnosis not present

## 2019-09-06 DIAGNOSIS — Z79899 Other long term (current) drug therapy: Secondary | ICD-10-CM | POA: Insufficient documentation

## 2019-09-06 DIAGNOSIS — J449 Chronic obstructive pulmonary disease, unspecified: Secondary | ICD-10-CM | POA: Diagnosis not present

## 2019-09-06 DIAGNOSIS — Z20822 Contact with and (suspected) exposure to covid-19: Secondary | ICD-10-CM | POA: Insufficient documentation

## 2019-09-06 HISTORY — DX: Chronic obstructive pulmonary disease, unspecified: J44.9

## 2019-09-06 LAB — BASIC METABOLIC PANEL
Anion gap: 6 (ref 5–15)
BUN: 11 mg/dL (ref 6–20)
CO2: 28 mmol/L (ref 22–32)
Calcium: 9.2 mg/dL (ref 8.9–10.3)
Chloride: 106 mmol/L (ref 98–111)
Creatinine, Ser: 0.91 mg/dL (ref 0.61–1.24)
GFR calc Af Amer: >60 mL/min (ref >60)
GFR calc non Af Amer: >60 mL/min (ref >60)
Glucose, Bld: 122 mg/dL — ABNORMAL HIGH (ref 70–99)
Potassium: 4.7 mmol/L (ref 3.5–5.1)
Sodium: 140 mmol/L (ref 135–145)

## 2019-09-06 LAB — CBC WITH DIFFERENTIAL/PLATELET
Abs Immature Granulocytes: 0.13 10*3/uL — ABNORMAL HIGH (ref 0.00–0.07)
Basophils Absolute: 0.1 10*3/uL (ref 0.0–0.1)
Basophils Relative: 1 %
Eosinophils Absolute: 0.2 10*3/uL (ref 0.0–0.5)
Eosinophils Relative: 2 %
HCT: 46.2 % (ref 39.0–52.0)
Hemoglobin: 14.8 g/dL (ref 13.0–17.0)
Immature Granulocytes: 1 %
Lymphocytes Relative: 16 %
Lymphs Abs: 2.1 10*3/uL (ref 0.7–4.0)
MCH: 31.5 pg (ref 26.0–34.0)
MCHC: 32 g/dL (ref 30.0–36.0)
MCV: 98.3 fL (ref 80.0–100.0)
Monocytes Absolute: 1 10*3/uL (ref 0.1–1.0)
Monocytes Relative: 8 %
Neutro Abs: 9.2 10*3/uL — ABNORMAL HIGH (ref 1.7–7.7)
Neutrophils Relative %: 72 %
Platelets: 312 10*3/uL (ref 150–400)
RBC: 4.7 MIL/uL (ref 4.22–5.81)
RDW: 13.2 % (ref 11.5–15.5)
WBC: 12.7 10*3/uL — ABNORMAL HIGH (ref 4.0–10.5)
nRBC: 0 % (ref 0.0–0.2)

## 2019-09-06 LAB — SURGICAL PCR SCREEN
MRSA, PCR: NEGATIVE
Staphylococcus aureus: NEGATIVE

## 2019-09-06 LAB — PROTIME-INR
INR: 0.9 (ref 0.8–1.2)
Prothrombin Time: 11.5 seconds (ref 11.4–15.2)

## 2019-09-06 LAB — SARS CORONAVIRUS 2 (TAT 6-24 HRS): SARS Coronavirus 2: NEGATIVE

## 2019-09-06 LAB — TYPE AND SCREEN
ABO/RH(D): O POS
Antibody Screen: NEGATIVE

## 2019-09-06 NOTE — Progress Notes (Signed)
   09/06/19 0815  OBSTRUCTIVE SLEEP APNEA  Have you ever been diagnosed with sleep apnea through a sleep study? No  Do you snore loudly (loud enough to be heard through closed doors)?  1  Do you often feel tired, fatigued, or sleepy during the daytime (such as falling asleep during driving or talking to someone)? 0  Has anyone observed you stop breathing during your sleep? 1  Do you have, or are you being treated for high blood pressure? 0  BMI more than 35 kg/m2? 1  Age > 50 (1-yes) 1  Neck circumference greater than:Male 16 inches or larger, Male 17inches or larger? 0  Male Gender (Yes=1) 1  Obstructive Sleep Apnea Score 5  Score 5 or greater  Results sent to PCP

## 2019-09-06 NOTE — Progress Notes (Signed)
PCP - Dr. Cathlean Cower Cardiologist - Denies  PPM/ICD - Denies  Chest x-ray - 09/06/19 EKG - 09/06/19 Stress Test - Denies ECHO - Denies Cardiac Cath - Denies  Sleep Study - Denies  Pt denies being diabetic.   Blood Thinner Instructions: N/A Aspirin Instructions: N/A  ERAS Protcol - No  COVID TEST- 09/06/19   Coronavirus Screening  Have you experienced the following symptoms:  Cough yes/no: No Fever (>100.48F)  yes/no: No Runny nose yes/no: No Sore throat yes/no: No Difficulty breathing/shortness of breath  yes/no: No  Have you or a family member traveled in the last 14 days and where? yes/no: No   If the patient indicates "YES" to the above questions, their PAT will be rescheduled to limit the exposure to others and, the surgeon will be notified. THE PATIENT WILL NEED TO BE ASYMPTOMATIC FOR 14 DAYS.   If the patient is not experiencing any of these symptoms, the PAT nurse will instruct them to NOT bring anyone with them to their appointment since they may have these symptoms or traveled as well.   Please remind your patients and families that hospital visitation restrictions are in effect and the importance of the restrictions.     Anesthesia review: No  Patient denies shortness of breath, fever, cough and chest pain at PAT appointment   All instructions explained to the patient, with a verbal understanding of the material. Patient agrees to go over the instructions while at home for a better understanding. Patient also instructed to self quarantine after being tested for COVID-19. The opportunity to ask questions was provided.

## 2019-09-07 MED ORDER — VANCOMYCIN HCL 1500 MG/300ML IV SOLN
1500.0000 mg | INTRAVENOUS | Status: AC
  Administered 2019-09-10: 1500 mg via INTRAVENOUS
  Filled 2019-09-07: qty 300

## 2019-09-10 ENCOUNTER — Inpatient Hospital Stay (HOSPITAL_COMMUNITY): Payer: Medicare Other | Admitting: Anesthesiology

## 2019-09-10 ENCOUNTER — Encounter (HOSPITAL_COMMUNITY): Payer: Self-pay | Admitting: Neurological Surgery

## 2019-09-10 ENCOUNTER — Inpatient Hospital Stay (HOSPITAL_COMMUNITY): Payer: Medicare Other

## 2019-09-10 ENCOUNTER — Encounter (HOSPITAL_COMMUNITY)
Admission: RE | Disposition: A | Discharge status: Home / Self Care | Payer: Self-pay | Source: Home / Self Care | Attending: Neurological Surgery

## 2019-09-10 ENCOUNTER — Inpatient Hospital Stay (HOSPITAL_COMMUNITY)
Admission: RE | Admit: 2019-09-10 | Discharge: 2019-09-11 | DRG: 455 | Disposition: A | Discharge status: Home / Self Care | Payer: Medicare Other | Attending: Neurological Surgery | Admitting: Neurological Surgery

## 2019-09-10 ENCOUNTER — Other Ambulatory Visit: Payer: Self-pay

## 2019-09-10 DIAGNOSIS — Z8371 Family history of colonic polyps: Secondary | ICD-10-CM

## 2019-09-10 DIAGNOSIS — Z9089 Acquired absence of other organs: Secondary | ICD-10-CM

## 2019-09-10 DIAGNOSIS — Z20822 Contact with and (suspected) exposure to covid-19: Secondary | ICD-10-CM | POA: Diagnosis not present

## 2019-09-10 DIAGNOSIS — F419 Anxiety disorder, unspecified: Secondary | ICD-10-CM | POA: Diagnosis present

## 2019-09-10 DIAGNOSIS — K219 Gastro-esophageal reflux disease without esophagitis: Secondary | ICD-10-CM | POA: Diagnosis not present

## 2019-09-10 DIAGNOSIS — Z818 Family history of other mental and behavioral disorders: Secondary | ICD-10-CM

## 2019-09-10 DIAGNOSIS — J449 Chronic obstructive pulmonary disease, unspecified: Secondary | ICD-10-CM | POA: Diagnosis present

## 2019-09-10 DIAGNOSIS — Z79899 Other long term (current) drug therapy: Secondary | ICD-10-CM | POA: Diagnosis not present

## 2019-09-10 DIAGNOSIS — M48061 Spinal stenosis, lumbar region without neurogenic claudication: Secondary | ICD-10-CM | POA: Diagnosis not present

## 2019-09-10 DIAGNOSIS — Z881 Allergy status to other antibiotic agents status: Secondary | ICD-10-CM

## 2019-09-10 DIAGNOSIS — Z791 Long term (current) use of non-steroidal anti-inflammatories (NSAID): Secondary | ICD-10-CM | POA: Diagnosis not present

## 2019-09-10 DIAGNOSIS — M4326 Fusion of spine, lumbar region: Secondary | ICD-10-CM | POA: Diagnosis not present

## 2019-09-10 DIAGNOSIS — E785 Hyperlipidemia, unspecified: Secondary | ICD-10-CM | POA: Diagnosis not present

## 2019-09-10 DIAGNOSIS — Z8249 Family history of ischemic heart disease and other diseases of the circulatory system: Secondary | ICD-10-CM

## 2019-09-10 DIAGNOSIS — M532X6 Spinal instabilities, lumbar region: Secondary | ICD-10-CM | POA: Diagnosis not present

## 2019-09-10 DIAGNOSIS — Z981 Arthrodesis status: Secondary | ICD-10-CM | POA: Diagnosis not present

## 2019-09-10 DIAGNOSIS — M5126 Other intervertebral disc displacement, lumbar region: Secondary | ICD-10-CM | POA: Diagnosis present

## 2019-09-10 DIAGNOSIS — Z800 Family history of malignant neoplasm of digestive organs: Secondary | ICD-10-CM

## 2019-09-10 DIAGNOSIS — F17210 Nicotine dependence, cigarettes, uncomplicated: Secondary | ICD-10-CM | POA: Diagnosis present

## 2019-09-10 DIAGNOSIS — M47816 Spondylosis without myelopathy or radiculopathy, lumbar region: Secondary | ICD-10-CM | POA: Diagnosis not present

## 2019-09-10 DIAGNOSIS — F329 Major depressive disorder, single episode, unspecified: Secondary | ICD-10-CM | POA: Diagnosis present

## 2019-09-10 DIAGNOSIS — Z79891 Long term (current) use of opiate analgesic: Secondary | ICD-10-CM | POA: Diagnosis not present

## 2019-09-10 DIAGNOSIS — Z419 Encounter for procedure for purposes other than remedying health state, unspecified: Secondary | ICD-10-CM

## 2019-09-10 DIAGNOSIS — Z86010 Personal history of colonic polyps: Secondary | ICD-10-CM

## 2019-09-10 SURGERY — POSTERIOR LUMBAR FUSION 2 LEVEL
Anesthesia: General | Site: Spine Lumbar

## 2019-09-10 MED ORDER — DEXAMETHASONE SODIUM PHOSPHATE 10 MG/ML IJ SOLN
INTRAMUSCULAR | Status: AC
  Filled 2019-09-10: qty 1

## 2019-09-10 MED ORDER — PROPOFOL 10 MG/ML IV BOLUS
INTRAVENOUS | Status: AC
  Filled 2019-09-10: qty 40

## 2019-09-10 MED ORDER — VANCOMYCIN HCL 1250 MG/250ML IV SOLN
1250.0000 mg | Freq: Once | INTRAVENOUS | Status: AC
  Administered 2019-09-10: 1250 mg via INTRAVENOUS
  Filled 2019-09-10: qty 250

## 2019-09-10 MED ORDER — ACETAMINOPHEN 325 MG PO TABS: 650.0000 mg | ORAL_TABLET | ORAL | Status: DC | PRN

## 2019-09-10 MED ORDER — SODIUM CHLORIDE 0.9% FLUSH
3.0000 mL | Freq: Two times a day (BID) | INTRAVENOUS | Status: DC
  Administered 2019-09-10: 3 mL via INTRAVENOUS

## 2019-09-10 MED ORDER — MEPERIDINE HCL 25 MG/ML IJ SOLN: 6.2500 mg | INTRAMUSCULAR | Status: DC | PRN

## 2019-09-10 MED ORDER — BUPIVACAINE HCL (PF) 0.25 % IJ SOLN
INTRAMUSCULAR | Status: DC | PRN
  Administered 2019-09-10: 6 mL

## 2019-09-10 MED ORDER — CHLORHEXIDINE GLUCONATE CLOTH 2 % EX PADS: 6.0000 | MEDICATED_PAD | Freq: Once | CUTANEOUS | Status: DC

## 2019-09-10 MED ORDER — ONDANSETRON HCL 4 MG/2ML IJ SOLN
INTRAMUSCULAR | Status: DC | PRN
  Administered 2019-09-10: 4 mg via INTRAVENOUS

## 2019-09-10 MED ORDER — LIDOCAINE 2% (20 MG/ML) 5 ML SYRINGE
INTRAMUSCULAR | Status: DC | PRN
  Administered 2019-09-10: 100 mg via INTRAVENOUS

## 2019-09-10 MED ORDER — OXYCODONE HCL 5 MG PO TABS
ORAL_TABLET | ORAL | Status: AC
  Filled 2019-09-10: qty 1

## 2019-09-10 MED ORDER — SODIUM CHLORIDE 0.9% FLUSH: 3.0000 mL | INTRAVENOUS | Status: DC | PRN

## 2019-09-10 MED ORDER — SODIUM CHLORIDE 0.9 % IV SOLN: INTRAVENOUS | Status: DC | PRN

## 2019-09-10 MED ORDER — LIDOCAINE 2% (20 MG/ML) 5 ML SYRINGE
INTRAMUSCULAR | Status: AC
  Filled 2019-09-10: qty 5

## 2019-09-10 MED ORDER — THROMBIN 5000 UNITS EX SOLR: OROMUCOSAL | Status: DC | PRN

## 2019-09-10 MED ORDER — TIZANIDINE HCL 4 MG PO TABS
4.0000 mg | ORAL_TABLET | Freq: Three times a day (TID) | ORAL | Status: DC
  Administered 2019-09-10 – 2019-09-11 (×2): 4 mg via ORAL
  Filled 2019-09-10 (×3): qty 1

## 2019-09-10 MED ORDER — OXYCODONE HCL 5 MG PO TABS
10.0000 mg | ORAL_TABLET | ORAL | Status: DC | PRN
  Administered 2019-09-10: 20 mg via ORAL
  Administered 2019-09-10: 15 mg via ORAL
  Administered 2019-09-11 (×3): 20 mg via ORAL
  Filled 2019-09-10 (×4): qty 4

## 2019-09-10 MED ORDER — OXYCODONE HCL 5 MG PO TABS
ORAL_TABLET | ORAL | Status: AC
  Filled 2019-09-10: qty 2

## 2019-09-10 MED ORDER — BUPIVACAINE HCL (PF) 0.25 % IJ SOLN
INTRAMUSCULAR | Status: AC
  Filled 2019-09-10: qty 30

## 2019-09-10 MED ORDER — SODIUM CHLORIDE 0.9 % IV SOLN: 250.0000 mL | INTRAVENOUS | Status: DC

## 2019-09-10 MED ORDER — ONDANSETRON HCL 4 MG PO TABS: 4.0000 mg | ORAL_TABLET | Freq: Four times a day (QID) | ORAL | Status: DC | PRN

## 2019-09-10 MED ORDER — DULOXETINE HCL 30 MG PO CPEP
60.0000 mg | ORAL_CAPSULE | Freq: Every day | ORAL | Status: DC
  Administered 2019-09-10 – 2019-09-11 (×2): 60 mg via ORAL
  Filled 2019-09-10 (×2): qty 2

## 2019-09-10 MED ORDER — MIDAZOLAM HCL 2 MG/2ML IJ SOLN
INTRAMUSCULAR | Status: AC
  Filled 2019-09-10: qty 2

## 2019-09-10 MED ORDER — VITAMIN E 180 MG (400 UNIT) PO CAPS
400.0000 [IU] | ORAL_CAPSULE | Freq: Every day | ORAL | Status: DC
  Administered 2019-09-11: 400 [IU] via ORAL
  Filled 2019-09-10 (×2): qty 1

## 2019-09-10 MED ORDER — CHLORHEXIDINE GLUCONATE 0.12 % MT SOLN: 15.0000 mL | Freq: Once | OROMUCOSAL | Status: DC

## 2019-09-10 MED ORDER — FENTANYL CITRATE (PF) 100 MCG/2ML IJ SOLN
INTRAMUSCULAR | Status: DC | PRN
  Administered 2019-09-10 (×12): 50 ug via INTRAVENOUS
  Administered 2019-09-10: 100 ug via INTRAVENOUS
  Administered 2019-09-10: 50 ug via INTRAVENOUS

## 2019-09-10 MED ORDER — ORAL CARE MOUTH RINSE: 15.0000 mL | Freq: Once | OROMUCOSAL | Status: DC

## 2019-09-10 MED ORDER — ALBUTEROL SULFATE (2.5 MG/3ML) 0.083% IN NEBU: 2.5000 mg | INHALATION_SOLUTION | Freq: Four times a day (QID) | RESPIRATORY_TRACT | Status: DC | PRN

## 2019-09-10 MED ORDER — ASCORBIC ACID 500 MG PO TABS
1000.0000 mg | ORAL_TABLET | Freq: Every day | ORAL | Status: DC
  Administered 2019-09-11: 1000 mg via ORAL
  Filled 2019-09-10: qty 2

## 2019-09-10 MED ORDER — FENTANYL CITRATE (PF) 250 MCG/5ML IJ SOLN
INTRAMUSCULAR | Status: AC
  Filled 2019-09-10: qty 5

## 2019-09-10 MED ORDER — THIAMINE HCL 100 MG/ML IJ SOLN
100.0000 mg | Freq: Every day | INTRAMUSCULAR | Status: DC
  Filled 2019-09-10: qty 1

## 2019-09-10 MED ORDER — DEXAMETHASONE SODIUM PHOSPHATE 10 MG/ML IJ SOLN
10.0000 mg | Freq: Once | INTRAMUSCULAR | Status: AC
  Administered 2019-09-10: 10 mg via INTRAVENOUS

## 2019-09-10 MED ORDER — ONDANSETRON HCL 4 MG/2ML IJ SOLN: 4.0000 mg | Freq: Once | INTRAMUSCULAR | Status: DC | PRN

## 2019-09-10 MED ORDER — LACTATED RINGERS IV SOLN: INTRAVENOUS | Status: DC

## 2019-09-10 MED ORDER — HYDROMORPHONE HCL 1 MG/ML IJ SOLN
INTRAMUSCULAR | Status: AC
  Filled 2019-09-10: qty 1

## 2019-09-10 MED ORDER — THROMBIN 5000 UNITS EX SOLR
CUTANEOUS | Status: AC
  Filled 2019-09-10: qty 5000

## 2019-09-10 MED ORDER — FOLIC ACID 1 MG PO TABS
1.0000 mg | ORAL_TABLET | Freq: Every day | ORAL | Status: DC
  Administered 2019-09-10 – 2019-09-11 (×2): 1 mg via ORAL
  Filled 2019-09-10 (×2): qty 1

## 2019-09-10 MED ORDER — PHENYLEPHRINE HCL-NACL 10-0.9 MG/250ML-% IV SOLN
INTRAVENOUS | Status: DC | PRN
  Administered 2019-09-10: 50 ug/min via INTRAVENOUS

## 2019-09-10 MED ORDER — ARTIFICIAL TEARS OPHTHALMIC OINT
TOPICAL_OINTMENT | OPHTHALMIC | Status: DC | PRN
Start: 2019-09-10 — End: 2019-09-10
  Administered 2019-09-10: 1 via OPHTHALMIC

## 2019-09-10 MED ORDER — PHENYLEPHRINE 40 MCG/ML (10ML) SYRINGE FOR IV PUSH (FOR BLOOD PRESSURE SUPPORT)
PREFILLED_SYRINGE | INTRAVENOUS | Status: DC | PRN
  Administered 2019-09-10 (×2): 80 ug via INTRAVENOUS

## 2019-09-10 MED ORDER — ACETAMINOPHEN 650 MG RE SUPP: 650.0000 mg | RECTAL | Status: DC | PRN

## 2019-09-10 MED ORDER — MIDAZOLAM HCL 5 MG/5ML IJ SOLN
INTRAMUSCULAR | Status: DC | PRN
  Administered 2019-09-10: 2 mg via INTRAVENOUS

## 2019-09-10 MED ORDER — SENNA 8.6 MG PO TABS
1.0000 | ORAL_TABLET | Freq: Two times a day (BID) | ORAL | Status: DC
  Administered 2019-09-10 – 2019-09-11 (×2): 8.6 mg via ORAL
  Filled 2019-09-10 (×2): qty 1

## 2019-09-10 MED ORDER — HYDROMORPHONE HCL 1 MG/ML IJ SOLN
0.2500 mg | INTRAMUSCULAR | Status: DC | PRN
  Administered 2019-09-10 (×2): 0.5 mg via INTRAVENOUS

## 2019-09-10 MED ORDER — MENTHOL 3 MG MT LOZG: 1.0000 | LOZENGE | OROMUCOSAL | Status: DC | PRN

## 2019-09-10 MED ORDER — GABAPENTIN 300 MG PO CAPS
300.0000 mg | ORAL_CAPSULE | Freq: Four times a day (QID) | ORAL | Status: DC
  Administered 2019-09-10 – 2019-09-11 (×2): 300 mg via ORAL
  Filled 2019-09-10 (×2): qty 1

## 2019-09-10 MED ORDER — ENSURE SURGERY PO LIQD
237.0000 mL | Freq: Two times a day (BID) | ORAL | Status: DC
  Administered 2019-09-11: 237 mL via ORAL
  Filled 2019-09-10 (×2): qty 237

## 2019-09-10 MED ORDER — ADULT MULTIVITAMIN W/MINERALS CH
1.0000 | ORAL_TABLET | Freq: Every day | ORAL | Status: DC
  Administered 2019-09-10 – 2019-09-11 (×2): 1 via ORAL
  Filled 2019-09-10 (×2): qty 1

## 2019-09-10 MED ORDER — THIAMINE HCL 100 MG PO TABS
100.0000 mg | ORAL_TABLET | Freq: Every day | ORAL | Status: DC
  Administered 2019-09-10 – 2019-09-11 (×2): 100 mg via ORAL
  Filled 2019-09-10 (×2): qty 1

## 2019-09-10 MED ORDER — 0.9 % SODIUM CHLORIDE (POUR BTL) OPTIME
TOPICAL | Status: DC | PRN
  Administered 2019-09-10: 1000 mL

## 2019-09-10 MED ORDER — PROPOFOL 10 MG/ML IV BOLUS
INTRAVENOUS | Status: DC | PRN
  Administered 2019-09-10: 40 mg via INTRAVENOUS
  Administered 2019-09-10: 20 mg via INTRAVENOUS
  Administered 2019-09-10: 160 mg via INTRAVENOUS

## 2019-09-10 MED ORDER — POTASSIUM CHLORIDE IN NACL 20-0.9 MEQ/L-% IV SOLN: INTRAVENOUS | Status: DC

## 2019-09-10 MED ORDER — ROCURONIUM BROMIDE 10 MG/ML (PF) SYRINGE
PREFILLED_SYRINGE | INTRAVENOUS | Status: DC | PRN
  Administered 2019-09-10: 100 mg via INTRAVENOUS
  Administered 2019-09-10 (×2): 20 mg via INTRAVENOUS
  Administered 2019-09-10: 40 mg via INTRAVENOUS

## 2019-09-10 MED ORDER — THROMBIN 20000 UNITS EX SOLR
CUTANEOUS | Status: AC
  Filled 2019-09-10: qty 20000

## 2019-09-10 MED ORDER — ONDANSETRON HCL 4 MG/2ML IJ SOLN
INTRAMUSCULAR | Status: AC
  Filled 2019-09-10: qty 2

## 2019-09-10 MED ORDER — THROMBIN 20000 UNITS EX SOLR: CUTANEOUS | Status: DC | PRN

## 2019-09-10 MED ORDER — CELECOXIB 200 MG PO CAPS
200.0000 mg | ORAL_CAPSULE | Freq: Two times a day (BID) | ORAL | Status: DC
  Administered 2019-09-10 – 2019-09-11 (×2): 200 mg via ORAL
  Filled 2019-09-10 (×2): qty 1

## 2019-09-10 MED ORDER — SUGAMMADEX SODIUM 200 MG/2ML IV SOLN
INTRAVENOUS | Status: DC | PRN
  Administered 2019-09-10: 250 mg via INTRAVENOUS

## 2019-09-10 MED ORDER — ONDANSETRON HCL 4 MG/2ML IJ SOLN: 4.0000 mg | Freq: Four times a day (QID) | INTRAMUSCULAR | Status: DC | PRN

## 2019-09-10 MED ORDER — HYDROMORPHONE HCL 1 MG/ML IJ SOLN
0.5000 mg | INTRAMUSCULAR | Status: DC | PRN
  Administered 2019-09-10: 0.5 mg via INTRAVENOUS
  Filled 2019-09-10: qty 0.5

## 2019-09-10 MED ORDER — PHENOL 1.4 % MT LIQD: 1.0000 | OROMUCOSAL | Status: DC | PRN

## 2019-09-10 MED ORDER — FOLIC ACID 5 MG/ML IJ SOLN
1.0000 mg | Freq: Every day | INTRAMUSCULAR | Status: DC
  Filled 2019-09-10: qty 0.2

## 2019-09-10 MED ORDER — ARTIFICIAL TEARS OPHTHALMIC OINT
TOPICAL_OINTMENT | OPHTHALMIC | Status: AC
  Filled 2019-09-10: qty 3.5

## 2019-09-10 SURGICAL SUPPLY — 67 items
ADH SKN CLS APL DERMABOND .7 (GAUZE/BANDAGES/DRESSINGS) ×1
APL SKNCLS STERI-STRIP NONHPOA (GAUZE/BANDAGES/DRESSINGS) ×1
BAG DECANTER FOR FLEXI CONT (MISCELLANEOUS) ×2 IMPLANT
BASKET BONE COLLECTION (BASKET) ×2 IMPLANT
BENZOIN TINCTURE PRP APPL 2/3 (GAUZE/BANDAGES/DRESSINGS) ×2 IMPLANT
BIT DRILL PLIF MAS DISP 5.5MM (DRILL) IMPLANT
BLADE CLIPPER SURG (BLADE) IMPLANT
BUR CARBIDE MATCH 3.0 (BURR) ×2 IMPLANT
CAGE COROENT LRG MP 11X9X28-8 (Cage) ×2 IMPLANT
CAGE COROENT LRG MP 12X9X28-8 (Cage) ×2 IMPLANT
CANISTER SUCT 3000ML PPV (MISCELLANEOUS) ×2 IMPLANT
CARTRIDGE OIL MAESTRO DRILL (MISCELLANEOUS) ×1 IMPLANT
CNTNR URN SCR LID CUP LEK RST (MISCELLANEOUS) ×1 IMPLANT
CONT SPEC 4OZ STRL OR WHT (MISCELLANEOUS) ×2
COVER BACK TABLE 60X90IN (DRAPES) ×2 IMPLANT
COVER WAND RF STERILE (DRAPES) ×2 IMPLANT
DERMABOND ADVANCED (GAUZE/BANDAGES/DRESSINGS) ×1
DERMABOND ADVANCED .7 DNX12 (GAUZE/BANDAGES/DRESSINGS) ×1 IMPLANT
DIFFUSER DRILL AIR PNEUMATIC (MISCELLANEOUS) ×2 IMPLANT
DRAPE C-ARM 42X72 X-RAY (DRAPES) ×4 IMPLANT
DRAPE C-ARMOR (DRAPES) IMPLANT
DRAPE LAPAROTOMY 100X72X124 (DRAPES) ×2 IMPLANT
DRAPE SURG 17X23 STRL (DRAPES) ×2 IMPLANT
DRILL PLIF MAS DISP 5.5MM (DRILL) ×2
DRSG OPSITE POSTOP 4X8 (GAUZE/BANDAGES/DRESSINGS) ×1 IMPLANT
DURAPREP 26ML APPLICATOR (WOUND CARE) ×2 IMPLANT
ELECT REM PT RETURN 9FT ADLT (ELECTROSURGICAL) ×2
ELECTRODE REM PT RTRN 9FT ADLT (ELECTROSURGICAL) ×1 IMPLANT
EVACUATOR 1/8 PVC DRAIN (DRAIN) ×2 IMPLANT
GAUZE 4X4 16PLY RFD (DISPOSABLE) IMPLANT
GLOVE BIO SURGEON STRL SZ7 (GLOVE) IMPLANT
GLOVE BIO SURGEON STRL SZ8 (GLOVE) ×4 IMPLANT
GLOVE BIOGEL PI IND STRL 7.0 (GLOVE) IMPLANT
GLOVE BIOGEL PI INDICATOR 7.0 (GLOVE)
GOWN STRL REUS W/ TWL LRG LVL3 (GOWN DISPOSABLE) IMPLANT
GOWN STRL REUS W/ TWL XL LVL3 (GOWN DISPOSABLE) ×2 IMPLANT
GOWN STRL REUS W/TWL 2XL LVL3 (GOWN DISPOSABLE) IMPLANT
GOWN STRL REUS W/TWL LRG LVL3 (GOWN DISPOSABLE)
GOWN STRL REUS W/TWL XL LVL3 (GOWN DISPOSABLE) ×4
GRAFT TRINITY ELITE LGE HUMAN (Tissue) ×1 IMPLANT
HEMOSTAT POWDER KIT SURGIFOAM (HEMOSTASIS) IMPLANT
KIT BASIN OR (CUSTOM PROCEDURE TRAY) ×2 IMPLANT
KIT TURNOVER KIT B (KITS) ×2 IMPLANT
MILL MEDIUM DISP (BLADE) IMPLANT
NDL HYPO 25X1 1.5 SAFETY (NEEDLE) ×1 IMPLANT
NEEDLE HYPO 25X1 1.5 SAFETY (NEEDLE) ×2 IMPLANT
NS IRRIG 1000ML POUR BTL (IV SOLUTION) ×2 IMPLANT
OIL CARTRIDGE MAESTRO DRILL (MISCELLANEOUS) ×2
PACK LAMINECTOMY NEURO (CUSTOM PROCEDURE TRAY) ×2 IMPLANT
PAD ARMBOARD 7.5X6 YLW CONV (MISCELLANEOUS) ×6 IMPLANT
ROD PREBENT 80MM LUMBAR (Rod) ×2 IMPLANT
SCREW LOCK (Screw) ×12 IMPLANT
SCREW LOCK FXNS SPNE MAS PL (Screw) IMPLANT
SCREW SHANK 6.5X45 (Screw) ×4 IMPLANT
SCREW TULIP 5.5 (Screw) ×4 IMPLANT
SPONGE LAP 4X18 RFD (DISPOSABLE) IMPLANT
SPONGE SURGIFOAM ABS GEL 100 (HEMOSTASIS) ×2 IMPLANT
STRIP CLOSURE SKIN 1/2X4 (GAUZE/BANDAGES/DRESSINGS) ×3 IMPLANT
SUT VIC AB 0 CT1 18XCR BRD8 (SUTURE) ×1 IMPLANT
SUT VIC AB 0 CT1 8-18 (SUTURE) ×2
SUT VIC AB 2-0 CP2 18 (SUTURE) ×2 IMPLANT
SUT VIC AB 3-0 SH 8-18 (SUTURE) ×4 IMPLANT
SYR CONTROL 10ML LL (SYRINGE) ×2 IMPLANT
TOWEL GREEN STERILE (TOWEL DISPOSABLE) ×2 IMPLANT
TOWEL GREEN STERILE FF (TOWEL DISPOSABLE) ×2 IMPLANT
TRAY FOLEY MTR SLVR 16FR STAT (SET/KITS/TRAYS/PACK) ×2 IMPLANT
WATER STERILE IRR 1000ML POUR (IV SOLUTION) ×2 IMPLANT

## 2019-09-10 NOTE — H&P (Signed)
Subjective: Patient is a 59 y.o. male admitted for PLIF. Onset of symptoms was several months ago, gradually worsening since that time.  The pain is rated severe, and is located at the across the lower back and radiates to legs. The pain is described as aching and occurs all day. The symptoms have been progressive. Symptoms are exacerbated by exercise. MRI or CT showed spondylosis/ stenosis L3-5 above previous fusion   Past Medical History:  Diagnosis Date  . Allergy   . Anxiety   . Arthritis    Back  . Chronic back pain   . Colon polyps   . COPD (chronic obstructive pulmonary disease) ([place])   . Depression   . GERD (gastroesophageal reflux disease)    every now and then  . Hyperlipidemia 09/26/2015  . Lumbar surgical wound fluid collection    lumbar wound dehiscence  . Pneumonia 2020    Past Surgical History:  Procedure Laterality Date  . BACK SURGERY    . COLONOSCOPY    . COLONOSCOPY W/ BIOPSIES AND POLYPECTOMY    . EYE SURGERY    . KNEE ARTHROSCOPY WITH MEDIAL MENISECTOMY Left 01/23/2016   Procedure: KNEE ARTHROSCOPY WITH MEDIAL MENISECTOMY;  Surgeon: Melrose Nakayama, MD;  Location: [facility];  Service: Orthopedics;  Laterality: Left;  . LUMBAR WOUND DEBRIDEMENT N/A 03/21/2014   Procedure: LUMBAR WOUND DEBRIDEMENT;  Surgeon: Eustace Moore, MD;  Location: [facility];  Service: Neurosurgery;  Laterality: N/A;  . MAXIMUM ACCESS (MAS)POSTERIOR LUMBAR INTERBODY FUSION (PLIF) 1 LEVEL N/A 02/14/2014   Procedure: FOR MAXIMUM ACCESS SURGERY POSTERIOR LUMBAR INTERBODY FUSION LUMBAR TWO-THREE;  Surgeon: Eustace Moore, MD;  Location: [facility];  Service: Neurosurgery;  Laterality: N/A;  . NECK SURGERY    . SHOULDER SURGERY    . SPINE SURGERY    . TONSILLECTOMY      Prior to Admission medications   Medication Sig Start Date End Date Taking? Authorizing Provider  albuterol (VENTOLIN HFA) 108 (90 Base) MCG/ACT inhaler Inhale 1-2 puffs into the lungs every 6 (six) hours as  needed for wheezing or shortness of breath. 11/16/18  Yes [name], MD  atorvastatin (LIPITOR) 10 MG tablet Take 1 tablet (10 mg total) by mouth daily. Patient taking differently: Take 10 mg by mouth at bedtime.  09/27/18  Yes [name], MD  DULoxetine (CYMBALTA) 60 MG capsule Take 60 mg by mouth daily.  11/05/14  Yes [provider]  fluticasone (FLONASE) 50 MCG/ACT nasal spray Place 1-2 sprays into both nostrils daily for 10 days. Patient taking differently: Place 1-2 sprays into both nostrils daily as needed for allergies.  11/16/18 08/31/19 Yes [name], MD  gabapentin (NEURONTIN) 300 MG capsule Take 1 capsule (300 mg total) by mouth 3 (three) times daily. Patient taking differently: Take 300 mg by mouth 4 (four) times daily.  04/21/17  Yes Rosemarie Ax, MD  meloxicam (MOBIC) 15 MG tablet Take 1 tablet (15 mg total) by mouth daily. Patient taking differently: Take 15 mg by mouth at bedtime.  09/27/18  Yes [name], MD  Multiple Vitamin (MULTIVITAMIN WITH MINERALS) TABS tablet Take 1 tablet by mouth daily.   Yes [provider]  naproxen sodium (ALEVE) 220 MG tablet Take 220 mg by mouth 2 (two) times daily as needed (pain).   Yes [provider]  Oxycodone HCl 10 MG TABS Take 1-2 tablets (10-20 mg total) by mouth every 4 (four) hours as needed (pain). 01/23/16  Yes Loni Dolly, PA-C  tiZANidine (ZANAFLEX) 4 MG tablet Take 4 mg by mouth 3 (three) times daily.  11/05/14  Yes [provider]  ibuprofen (ADVIL,MOTRIN) 600 MG tablet Take 1 tablet (600 mg total) by mouth every 8 (eight) hours as needed. Patient not taking: Reported on 08/31/2019 07/13/18   Wieters, Hallie C, PA-C  triamcinolone (NASACORT) 55 MCG/ACT AERO nasal inhaler Place 2 sprays into the nose daily. Patient not taking: Reported on 08/31/2019 05/23/18   [name], MD   Allergies  Allergen Reactions  . Cefuroxime Axetil Hives    Social History   Tobacco Use  . Smoking  status: Current Every Day Smoker    Packs/day: 0.50    Years: 44.00    Pack years: 22.00    Types: Cigarettes  . Smokeless tobacco: Current User    Types: Snuff  Substance Use Topics  . Alcohol use: Yes    Alcohol/week: 24.0 standard drinks    Types: 24 Standard drinks or equivalent per week    Comment: social beer    Family History  Problem Relation Age of Onset  . Colon cancer Father 78       pt thinks father had colon cancer  . Stomach cancer Father   . Hypertension Mother   . Colon polyps Brother   . Aneurysm Maternal Aunt 58       brain  . Heart disease Maternal Aunt        d 75  . Stomach cancer Paternal Uncle 54  . Aneurysm Maternal Grandfather        stomach  . Colon cancer Maternal Grandfather        thinks grandfather had colon cancer  . Stomach cancer Paternal Grandfather 47  . Depression Brother 59  . Suicidality Brother   . Stomach cancer Paternal Uncle 64  . Esophageal cancer Neg Hx   . Rectal cancer Neg Hx   . Diabetes Neg Hx      Review of Systems  Positive ROS: neg  All other systems have been reviewed and were otherwise negative with the exception of those mentioned in the HPI and as above.  Objective: Vital signs in last 24 hours: Temp:  [98.5 F (36.9 C)] 98.5 F (36.9 C) (05/10 1003) Pulse Rate:  [88] 88 (05/10 1003) Resp:  [18] 18 (05/10 1003) BP: (160)/(90) 160/90 (05/10 1003) SpO2:  [97 %] 97 % (05/10 1003) Weight:  [119.7 kg] 119.7 kg (05/10 1003)  General Appearance: Alert, cooperative, no distress, appears stated age Head: Normocephalic, without obvious abnormality, atraumatic Eyes: PERRL, conjunctiva/corneas clear, EOM's intact    Neck: Supple, symmetrical, trachea midline Back: Symmetric, no curvature, ROM normal, no CVA tenderness Lungs:  respirations unlabored Heart: Regular rate and rhythm Abdomen: Soft, non-tender Extremities: Extremities normal, atraumatic, no cyanosis or edema Pulses: 2+ and symmetric all  extremities Skin: Skin color, texture, turgor normal, no rashes or lesions  NEUROLOGIC:   Mental status: Alert and oriented x4,  no aphasia, good attention span, fund of knowledge, and memory Motor Exam - grossly normal Sensory Exam - grossly normal Reflexes: trace Coordination - grossly normal Gait - grossly normal Balance - grossly normal Cranial Nerves: I: smell Not tested  II: visual acuity  OS: nl    OD: nl  II: visual fields Full to confrontation  II: pupils Equal, round, reactive to light  III,VII: ptosis None  III,IV,VI: extraocular muscles  Full ROM  V: mastication Normal  V: facial light touch sensation  Normal  V,VII: corneal reflex  Present  VII: facial muscle function - upper  Normal  VII: facial muscle function - lower Normal  VIII: hearing Not tested  IX: soft palate elevation  Normal  IX,X: gag reflex Present  XI: trapezius strength  5/5  XI: sternocleidomastoid strength 5/5  XI: neck flexion strength  5/5  XII: tongue strength  Normal    Data Review Lab Results  Component Value Date   WBC 12.7 (H) 09/06/2019   HGB 14.8 09/06/2019   HCT 46.2 09/06/2019   MCV 98.3 09/06/2019   PLT 312 09/06/2019   Lab Results  Component Value Date   NA 140 09/06/2019   K 4.7 09/06/2019   CL 106 09/06/2019   CO2 28 09/06/2019   BUN 11 09/06/2019   CREATININE 0.91 09/06/2019   GLUCOSE 122 (H) 09/06/2019   Lab Results  Component Value Date   INR 0.9 09/06/2019    Assessment/Plan:  Estimated body mass index is 36.82 kg/m as calculated from the following:   Height as of this encounter: 5\' 11"  (1.803 m).   Weight as of this encounter: 119.7 kg. Patient admitted for PLIF. Patient has failed a reasonable attempt at conservative therapy.  I explained the condition and procedure to the patient and answered any questions.  Patient wishes to proceed with procedure as planned. Understands risks/ benefits and typical outcomes of procedure.   Eustace Moore 09/10/2019 10:08 AM

## 2019-09-10 NOTE — Progress Notes (Addendum)
Pharmacy Antibiotic Note  [name] is a 59 y.o. male admitted on 09/10/2019 with surgical prophylaxis.  Pharmacy has been consulted for vancomycin dosing. -no drain in place -SCr= 0.91 on 09/06/19 -vancomycin 1500mg  IV given at 10:30am  Plan: -Vancomycin 1250mg  IV x1 at 10:30pm Will sign off. Please contact pharmacy with any other needs.  Thank you Hildred Laser, PharmD Clinical Pharmacist **Pharmacist phone directory can now be found on [email or web address] ([email or web address]).  Listed under [facility].

## 2019-09-10 NOTE — Transfer of Care (Signed)
Immediate Anesthesia Transfer of Care Note  Patient: [name]  Procedure(s) Performed: LUMBAR THREE-FOUR, LUMBAR FOUR-FIVE POSTERIOR LUMBAR INTERBODY FUSION WITH EXTENSION OF EXISTING FUSION (N/A Spine Lumbar)  Patient Location: PACU  Anesthesia Type:General  Level of Consciousness: awake, oriented and patient cooperative  Airway & Oxygen Therapy: Patient Spontanous Breathing and Patient connected to face mask oxygen  Post-op Assessment: Report given to RN and Post -op Vital signs reviewed and stable  Post vital signs: Reviewed  Last Vitals:  Vitals Value Taken Time  BP 126/71 09/10/19 1657  Temp    Pulse    Resp 14 09/10/19 1705  SpO2    Vitals shown include unvalidated device data.  Last Pain:  Vitals:   09/10/19 1032  PainSc: 2       Patients Stated Pain Goal: 2 ([id number])  Complications: No apparent anesthesia complications

## 2019-09-10 NOTE — Anesthesia Procedure Notes (Signed)
Procedure Name: Intubation Date/Time: 09/10/2019 12:45 PM Performed by: Lance Coon, CRNA Pre-anesthesia Checklist: Patient identified, Emergency Drugs available, Suction available, Patient being monitored and Timeout performed Patient Re-evaluated:Patient Re-evaluated prior to induction Oxygen Delivery Method: Circle system utilized Preoxygenation: Pre-oxygenation with 100% oxygen Induction Type: IV induction Ventilation: Mask ventilation without difficulty Laryngoscope Size: Miller and 3 Grade View: Grade II Tube type: Oral Tube size: 8.0 mm Number of attempts: 1 Airway Equipment and Method: Stylet Placement Confirmation: ETT inserted through vocal cords under direct vision,  breath sounds checked- equal and bilateral and positive ETCO2 Secured at: 23 cm Tube secured with: Tape Dental Injury: Teeth and Oropharynx as per pre-operative assessment

## 2019-09-10 NOTE — Op Note (Signed)
09/10/2019  4:55 PM  PATIENT:  [name]  59 y.o. male  PRE-OPERATIVE DIAGNOSIS: Adjacent level stenosis L3-4, L4-5, lumbar disc herniation L4-5 left, back and leg pain  POST-OPERATIVE DIAGNOSIS:  same  PROCEDURE:   1. Decompressive lumbar laminectomy, hemifacetectomy and foraminotomies L3-4 and L4-5 requiring more work than would be required for a simple exposure of the disk for PLIF in order to adequately decompress the neural elements and address the spinal stenosis 2. Posterior lumbar interbody fusion L3-4 and L4-5 using peek interbody cages packed with morcellized allograft and autograft  3. Posterior fixation L3-L5 inclusive using NuVasive cortical pedicle screws.  Removal of nonsegmental fixation L2-3 4. Intertransverse arthrodesis L3-L5 using morcellized autograft and allograft.  SURGEON:  Sherley Bounds, MD  ASSISTANTS: Glenford Peers, FNP  ANESTHESIA:  General  EBL: 900 ml  Total I/O In: 2000 [I.V.:2000] Out: 1600 [Urine:700; Blood:900]  BLOOD ADMINISTERED:none  DRAINS: none   INDICATION FOR PROCEDURE: This patient presented with severe back and leg pain. Imaging revealed severe adjacent level stenosis L3-4 and L4-5 below previous L2-3 fusion. The patient tried a reasonable attempt at conservative medical measures without relief. I recommended decompression and instrumented fusion to address the stenosis as well as the segmental  instability.  Patient understood the risks, benefits, and alternatives and potential outcomes and wished to proceed.  PROCEDURE DETAILS:  The patient was brought to the operating room. After induction of generalized endotracheal anesthesia the patient was rolled into the prone position on chest rolls and all pressure points were padded. The patient's lumbar region was cleaned and then prepped with DuraPrep and draped in the usual sterile fashion. Anesthesia was injected and then a dorsal midline incision was made and carried down to the  lumbosacral fascia. The fascia was opened and the paraspinous musculature was taken down in a subperiosteal fashion to expose L3-4 and L4-5 as well as the previously placed instrumentation at L2-3. A self-retaining retractor was placed. Intraoperative fluoroscopy confirmed my level, and I started with removal of the nonsegmental fixation at L2-3.  The locking caps were removed and the rods were removed.  The screws had good purchase.  I decided to leave the L2 screws in place though I would not connect the rod into them, this was done in case he ever needed surgery at L1-2.  He would be quite difficult to find those pedicle screw entry zones once again and placed new screws.    I then turned my attention to the decompression and complete lumbar laminectomies, hemi- facetectomies, and foraminotomies were performed at L3-4 and L4-5.  My nurse practitioner was directly involved in the decompression and exposure of the neural elements. the patient had significant spinal stenosis and this required more work than would be required for a simple exposure of the disc for posterior lumbar interbody fusion which would only require a limited laminotomy. Much more generous decompression and generous foraminotomy was undertaken in order to adequately decompress the neural elements and address the patient's leg pain. The yellow ligament was removed to expose the underlying dura and nerve roots, and generous foraminotomies were performed to adequately decompress the neural elements. Both the exiting and traversing nerve roots were decompressed on both sides until a coronary dilator passed easily along the nerve roots. Once the decompression was complete, I turned my attention to the posterior lower lumbar interbody fusion. The epidural venous vasculature was coagulated and cut sharply. Disc space was incised and the initial discectomy was performed with pituitary rongeurs. The  disc space was distracted with sequential distractors  to a height of 12 mm. We then used a series of scrapers and shavers to prepare the endplates for fusion. The midline was prepared with Epstein curettes. Once the complete discectomy was finished, we packed an appropriate sized interbody cage with local autograft and morcellized allograft, gently retracted the nerve root, and tapped the cage into position at L3-4 and L4-5.  The midline between the cages was packed with morselized autograft and allograft. We then turned our attention to the placement of the lower pedicle screws. The pedicle screw entry zones were identified utilizing surface landmarks and fluoroscopy. I drilled into each pedicle utilizing the hand drill, and tapped each pedicle with the appropriate tap. We palpated with a ball probe to assure no break in the cortex. We then placed 6.5 x 45 mm cortical pedicle screws into the pedicles bilaterally at L4 and L5.  My nurse practitioner assisted in placement of the pedicle screws.  We then decorticated the transverse processes and laid a mixture of morcellized autograft and allograft out over these to perform intertransverse arthrodesis at L3-L5. We then placed lordotic rods into the multiaxial screw heads of the pedicle screws and locked these in position with the locking caps and anti-torque device. We then checked our construct with AP and lateral fluoroscopy. Irrigated with copious amounts of bacitracin-containing saline solution. Inspected the nerve roots once again to assure adequate decompression, lined to the dura with Gelfoam, placed powdered vancomycin into the wound, and then we closed the muscle and the fascia with 0 Vicryl. Closed the subcutaneous tissues with 2-0 Vicryl and subcuticular tissues with 3-0 Vicryl. The skin was closed with benzoin and Steri-Strips. Dressing was then applied, the patient was awakened from general anesthesia and transported to the recovery room in stable condition. At the end of the procedure all sponge, needle and  instrument counts were correct.   PLAN OF CARE: admit to inpatient  PATIENT DISPOSITION:  PACU - hemodynamically stable.   Delay start of Pharmacological VTE agent (>24hrs) due to surgical blood loss or risk of bleeding:  yes

## 2019-09-10 NOTE — Anesthesia Preprocedure Evaluation (Addendum)
Anesthesia Evaluation  Patient identified by MRN, date of birth, ID band Patient awake    Reviewed: Allergy & Precautions, NPO status , Patient's Chart, lab work & pertinent test results  Airway Mallampati: II  TM Distance: >3 FB Neck ROM: Full    Dental  (+) Teeth Intact, Dental Advisory Given   Pulmonary COPD, Current Smoker and Patient abstained from smoking.,    Pulmonary exam normal        Cardiovascular Normal cardiovascular exam     Neuro/Psych Anxiety Depression    GI/Hepatic GERD  Medicated and Controlled,  Endo/Other    Renal/GU      Musculoskeletal   Abdominal   Peds  Hematology   Anesthesia Other Findings   Reproductive/Obstetrics                            Anesthesia Physical Anesthesia Plan  ASA: III  Anesthesia Plan: General   Post-op Pain Management:    Induction: Intravenous  PONV Risk Score and Plan: 1 and Ondansetron  Airway Management Planned: Oral ETT  Additional Equipment:   Intra-op Plan:   Post-operative Plan: Extubation in OR  Informed Consent: I have reviewed the patients History and Physical, chart, labs and discussed the procedure including the risks, benefits and alternatives for the proposed anesthesia with the patient or authorized representative who has indicated his/her understanding and acceptance.       Plan Discussed with: CRNA and Surgeon  Anesthesia Plan Comments:         Anesthesia Quick Evaluation

## 2019-09-10 NOTE — Anesthesia Postprocedure Evaluation (Signed)
Anesthesia Post Note  Patient: [name]  Procedure(s) Performed: LUMBAR THREE-FOUR, LUMBAR FOUR-FIVE POSTERIOR LUMBAR INTERBODY FUSION WITH EXTENSION OF EXISTING FUSION (N/A Spine Lumbar)     Patient location during evaluation: PACU Anesthesia Type: General Level of consciousness: awake and alert Pain management: pain level controlled Vital Signs Assessment: post-procedure vital signs reviewed and stable Respiratory status: spontaneous breathing, nonlabored ventilation, respiratory function stable and patient connected to nasal cannula oxygen Cardiovascular status: blood pressure returned to baseline and stable Postop Assessment: no apparent nausea or vomiting Anesthetic complications: no    Last Vitals:  Vitals:   09/10/19 1840 09/10/19 1856  BP: (!) 129/92   Pulse: (!) 123 (!) 108  Resp: 20   Temp: 37.2 C   SpO2: 97% 94%    Last Pain:  Vitals:   09/10/19 1840  TempSrc: Oral  PainSc:                  Neoma Uhrich DAVID

## 2019-09-11 MED ORDER — OXYCODONE HCL 10 MG PO TABS: 10.0000 mg | ORAL_TABLET | ORAL | 0 refills | Status: DC | PRN

## 2019-09-11 NOTE — Progress Notes (Signed)
Patient is discharged from room 3C07 at this time. Alert and in stable condition. IV site d/c'd and instructions read to patient and spouse with understanding verbalized and all questions answered. Left unit via wheelchair with all belongings at side. 

## 2019-09-11 NOTE — Evaluation (Signed)
Occupational Therapy Evaluation Patient Details Name: [name] MRN: [id number] DOB: 29-Oct-1960 Today's Date: 09/11/2019    History of Present Illness Patient is a 59 y/o male who presents s/p L3-L5 PLIF 5/10. PMH includes OA, shoulder, neck, spine surgeries.   Clinical Impression   PTA, pt was living at home with his wife, pt was independent with ADL/IADL and functional mobility. Pt currently demonstrates ability to complete ADL with supervision for safety and adherence to precautions. Pt requires supervision for functional mobility without AD. Educated pt on environmental modifications to maximize adherence to precautions and maximize independence.  Patient evaluated by Occupational Therapy with no further acute OT needs identified. All education has been completed and the patient has no further questions. See below for any follow-up Occupational Therapy or equipment needs. OT to sign off. Thank you for referral.      Follow Up Recommendations  No OT follow up    Equipment Recommendations  None recommended by OT    Recommendations for Other Services       Precautions / Restrictions Precautions Precautions: Back Precaution Booklet Issued: Yes (comment) Precaution Comments: educated pt on provided handout Required Braces or Orthoses: Spinal Brace Spinal Brace: Lumbar corset;Applied in sitting position Restrictions Weight Bearing Restrictions: No      Mobility Bed Mobility               General bed mobility comments: pt sitting in recliner upon arrival  Transfers Overall transfer level: Needs assistance Equipment used: None Transfers: Sit to/from Stand Sit to Stand: Supervision         General transfer comment: supervision for safety    Balance Overall balance assessment: Modified Independent                                         ADL either performed or assessed with clinical judgement   ADL Overall ADL's : Needs  assistance/impaired                                     Functional mobility during ADLs: Supervision/safety General ADL Comments: supervision for safe completion of ADL. Pt demonstrated ability to complete UB and LB dressing with supervision pt demonstrated increased time and effort;educated pt on use of AE to maximize independence with dressing, Pt reports he has a reacher at home;pt demonstrated independence with donning/doffing back brace and verbalized proper wear schedule;     Vision         Perception     Praxis      Pertinent Vitals/Pain Pain Assessment: 0-10 Pain Score: 3  Pain Location: back incision site Pain Descriptors / Indicators: Sore Pain Intervention(s): Limited activity within patient's tolerance;Monitored during session     Hand Dominance Right   Extremity/Trunk Assessment Upper Extremity Assessment Upper Extremity Assessment: Overall WFL for tasks assessed   Lower Extremity Assessment Lower Extremity Assessment: Defer to PT evaluation   Cervical / Trunk Assessment Cervical / Trunk Assessment: Other exceptions Cervical / Trunk Exceptions: no bending, no lifting, no twisting   Communication Communication Communication: No difficulties   Cognition Arousal/Alertness: Awake/alert Behavior During Therapy: WFL for tasks assessed/performed Overall Cognitive Status: Within Functional Limits for tasks assessed  General Comments: pt demonstrated good awareness of back precuations;able to problem solve through hypothetical scenarios for adherence to back precautions   General Comments  vss;    Exercises     Shoulder Instructions      Home Living Family/patient expects to be discharged to:: Private residence Living Arrangements: Spouse/significant other Available Help at Discharge: Family;Available 24 hours/day Type of Home: House Home Access: Stairs to enter [employer] of Steps:  5 Entrance Stairs-Rails: Right;Left Home Layout: One level     Bathroom Shower/Tub: Occupational psychologist: Standard Bathroom Accessibility: No   Home Equipment: Cane - single point;Bedside commode          Prior Functioning/Environment Level of Independence: Independent                 OT Problem List: Decreased range of motion;Decreased activity tolerance;Decreased knowledge of precautions;Pain      OT Treatment/Interventions:      OT Goals(Current goals can be found in the care plan section) Acute Rehab OT Goals Patient Stated Goal: to go home today OT Goal Formulation: With patient Time For Goal Achievement: 09/25/19 Potential to Achieve Goals: Good  OT Frequency:     Barriers to D/C:            Co-evaluation              AM-PAC OT "6 Clicks" Daily Activity     Outcome Measure Help from another person eating meals?: None Help from another person taking care of personal grooming?: A Little Help from another person toileting, which includes using toliet, bedpan, or urinal?: A Little Help from another person bathing (including washing, rinsing, drying)?: A Little Help from another person to put on and taking off regular upper body clothing?: A Little Help from another person to put on and taking off regular lower body clothing?: A Little 6 Click Score: 19   End of Session Equipment Utilized During Treatment: Gait belt;Back brace Nurse Communication: Mobility status  Activity Tolerance: Patient tolerated treatment well Patient left: in chair;with call bell/phone within reach  OT Visit Diagnosis: Other abnormalities of gait and mobility (R26.89);Pain Pain - part of body: (back )                Time: [id number] OT Time Calculation (min): 14 min Charges:  OT General Charges $OT Visit: 1 Visit OT Evaluation $OT Eval Low Complexity: [place] OTR/L Acute Rehabilitation Services Office: [place] 09/11/2019,  9:41 AM

## 2019-09-11 NOTE — Discharge Instructions (Signed)

## 2019-09-11 NOTE — Discharge Summary (Signed)
Physician Discharge Summary  Patient ID: [name] MRN: [id number] DOB/AGE: June 07, 1960 59 y.o.  Admit date: 09/10/2019 Discharge date: 09/11/2019  Admission Diagnoses: adjacent level stenosis L3-4 l4-5, back and leg pain, claudication    Discharge Diagnoses: same   Discharged Condition: good  Hospital Course: The patient was admitted on 09/10/2019 and taken to the operating room where the patient underwent PLIF L3-4 L4-5. The patient tolerated the procedure well and was taken to the recovery room and then to the floor in stable condition. The hospital course was routine. There were no complications. The wound remained clean dry and intact. Pt had appropriate back soreness. No complaints of leg pain or new N/T/W. The patient remained afebrile with stable vital signs, and tolerated a regular diet. The patient continued to increase activities, and pain was well controlled with oral pain medications.   Consults: None  Significant Diagnostic Studies:  Results for orders placed or performed during the hospital encounter of 09/06/19  SARS CORONAVIRUS 2 (TAT 6-24 HRS) Nasopharyngeal Nasopharyngeal Swab   Specimen: Nasopharyngeal Swab  Result Value Ref Range   SARS Coronavirus 2 NEGATIVE NEGATIVE    Chest 2 View  Result Date: 09/06/2019 CLINICAL DATA:  Preoperative chest x-ray lumbar surgery. EXAM: CHEST - 2 VIEW COMPARISON:  07/17/2018. FINDINGS: Mediastinum hilar structures normal. Lungs are clear. No pleural effusion or pneumothorax. Heart size normal. Diffuse osteopenia degenerative change. Old healed right rib fractures again noted. Stable mild lower thoracic vertebral body compression fractures. Prior cervical spine fusion. IMPRESSION: No acute cardiopulmonary disease. Electronically Signed   By: Marcello Moores  Register   On: 09/06/2019 08:55   DG Lumbar Spine 2-3 Views  Result Date: 09/10/2019 CLINICAL DATA:  Lumbar fusion EXAM: LUMBAR SPINE - 2-3 VIEW; DG C-ARM 1-60 MIN COMPARISON:   None. FLUOROSCOPY TIME:  Fluoroscopy Time:  1 minutes 17 seconds Radiation Exposure Index (if provided by the fluoroscopic device): Not available Number of Acquired Spot Images: 2 FINDINGS: Interbody fusion is now seen at L3-4 and L4-5 with pedicle screws and posterior fixation. Previous fusion at L2-3 is noted with pedicle screws at L2. IMPRESSION: Interval interbody fusion at L3-4 and L4-5. Electronically Signed   By: Inez Catalina M.D.   On: 09/10/2019 16:56   DG C-Arm 1-60 Min  Result Date: 09/10/2019 CLINICAL DATA:  Lumbar fusion EXAM: LUMBAR SPINE - 2-3 VIEW; DG C-ARM 1-60 MIN COMPARISON:  None. FLUOROSCOPY TIME:  Fluoroscopy Time:  1 minutes 17 seconds Radiation Exposure Index (if provided by the fluoroscopic device): Not available Number of Acquired Spot Images: 2 FINDINGS: Interbody fusion is now seen at L3-4 and L4-5 with pedicle screws and posterior fixation. Previous fusion at L2-3 is noted with pedicle screws at L2. IMPRESSION: Interval interbody fusion at L3-4 and L4-5. Electronically Signed   By: Inez Catalina M.D.   On: 09/10/2019 16:56    Antibiotics:  Anti-infectives (From admission, onward)   Start     Dose/Rate Route Frequency Ordered Stop   09/10/19 2230  vancomycin (VANCOREADY) IVPB 1250 mg/250 mL     1,250 mg 166.7 mL/hr over 90 Minutes Intravenous  Once 09/10/19 1853 09/11/19 0750   09/10/19 1409  bacitracin 50,000 Units in sodium chloride 0.9 % 500 mL irrigation  Status:  Discontinued       As needed 09/10/19 1411 09/10/19 1648   09/10/19 0600  vancomycin (VANCOREADY) IVPB 1500 mg/300 mL     1,500 mg 150 mL/hr over 120 Minutes Intravenous To [facility] 09/07/19 0751 09/10/19 1854  Discharge Exam: Blood pressure 134/81, pulse (!) 109, temperature 98.8 F (37.1 C), temperature source Oral, resp. rate 20, height 5\' 11"  (1.803 m), weight 119.7 kg, SpO2 97 %. Neurologic: Grossly normal Dressing dry  Discharge Medications:   Allergies as of 09/11/2019       Reactions   Cefuroxime Axetil Hives      Medication List    STOP taking these medications   ibuprofen 600 MG tablet Commonly known as: ADVIL   naproxen sodium 220 MG tablet Commonly known as: ALEVE     TAKE these medications   albuterol 108 (90 Base) MCG/ACT inhaler Commonly known as: VENTOLIN HFA Inhale 1-2 puffs into the lungs every 6 (six) hours as needed for wheezing or shortness of breath.   atorvastatin 10 MG tablet Commonly known as: LIPITOR Take 1 tablet (10 mg total) by mouth daily. What changed: when to take this   DULoxetine 60 MG capsule Commonly known as: CYMBALTA Take 60 mg by mouth daily.   fluticasone 50 MCG/ACT nasal spray Commonly known as: FLONASE Place 1-2 sprays into both nostrils daily for 10 days. What changed:   when to take this  reasons to take this   gabapentin 300 MG capsule Commonly known as: NEURONTIN Take 1 capsule (300 mg total) by mouth 3 (three) times daily. What changed: when to take this   meloxicam 15 MG tablet Commonly known as: MOBIC Take 1 tablet (15 mg total) by mouth daily. What changed: when to take this   multivitamin with minerals Tabs tablet Take 1 tablet by mouth daily.   Oxycodone HCl 10 MG Tabs Take 1-2 tablets (10-20 mg total) by mouth every 4 (four) hours as needed (pain).   tiZANidine 4 MG tablet Commonly known as: ZANAFLEX Take 4 mg by mouth 3 (three) times daily.   triamcinolone 55 MCG/ACT Aero nasal inhaler Commonly known as: NASACORT Place 2 sprays into the nose daily.            Durable Medical Equipment  (From admission, onward)         Start     Ordered   09/10/19 1835  DME Walker rolling  Once    Question:  Patient needs a walker to treat with the following condition  Answer:  S/P lumbar fusion   09/10/19 1834   09/10/19 1835  DME 3 n 1  Once     09/10/19 1834          Disposition: home   Final Dx: PLIF L3-4 L4-5  Discharge Instructions     Remove dressing in 72 hours    Complete by: As directed    Call MD for:  difficulty breathing, headache or visual disturbances   Complete by: As directed    Call MD for:  persistant nausea and vomiting   Complete by: As directed    Call MD for:  redness, tenderness, or signs of infection (pain, swelling, redness, odor or green/yellow discharge around incision site)   Complete by: As directed    Call MD for:  severe uncontrolled pain   Complete by: As directed    Call MD for:  temperature >100.4   Complete by: As directed    Diet - low sodium heart healthy   Complete by: As directed    Increase activity slowly   Complete by: As directed       Follow-up Information    Eustace Moore, MD. Schedule an appointment as soon as possible for a visit in  2 week(s).   Specialty: Neurosurgery Contact information: [address] [place] [address] [place] [address] [phone]            Signed: Eustace Moore 09/11/2019, 7:52 AM

## 2019-09-11 NOTE — Evaluation (Signed)
Physical Therapy Evaluation Patient Details Name: [name] MRN: [id number] DOB: [birth year] Today's Date: 09/11/2019   History of Present Illness  Patient is a 59 y/o male who presents s/p L3-L5 PLIF 5/10. PMH includes OA, shoulder, neck, spine surgeries.  Clinical Impression  Patient presents with pain and post surgical deficits s/p above surgery. Pt independent and lives with wife PTA. Today, pt tolerated bed mobility, transfers, and gait and stair training with Min guard-supervision for safety. Education re: log roll technique, back precautions, brace application, positioning and walking program etc. Will have support from wife at home. Pt does not require skilled therapy services as pt functioning close to baseline and has necessary assist at home. All education completed. Discharge from therapy.    Follow Up Recommendations No PT follow up;Supervision - Intermittent    Equipment Recommendations  None recommended by PT    Recommendations for Other Services       Precautions / Restrictions Precautions Precautions: Back Precaution Booklet Issued: Yes (comment) Precaution Comments: Reviewed precautions and handout Required Braces or Orthoses: Spinal Brace Spinal Brace: Lumbar corset;Applied in sitting position Restrictions Weight Bearing Restrictions: No      Mobility  Bed Mobility Overal bed mobility: Needs Assistance Bed Mobility: Rolling;Sidelying to Sit;Sit to Sidelying Rolling: Modified independent (Device/Increase time) Sidelying to sit: Modified independent (Device/Increase time)     Sit to sidelying: Modified independent (Device/Increase time) General bed mobility comments: Cues for log roll technique; use of rail, HOB mostly flat to simulate home but pt has sleep number so able to adjust as needed.  Transfers Overall transfer level: Needs assistance Equipment used: None Transfers: Sit to/from Stand Sit to Stand: Supervision         General transfer  comment: supervision for safety  Ambulation/Gait Ambulation/Gait assistance: Supervision Gait Distance (Feet): 400 Feet Assistive device: None Gait Pattern/deviations: Step-through pattern;Decreased stride length;Wide base of support   Gait velocity interpretation: 1.31 - 2.62 ft/sec, indicative of limited community ambulator General Gait Details: Slow, steady gait with wide BoS; needs cues for upright.  Stairs Stairs: Yes Stairs assistance: Min guard Stair Management: One rail Right;One rail Left;Step to pattern Number of Stairs: 5 General stair comments: Cues for technique and safety.  Wheelchair Mobility    Modified Rankin (Stroke Patients Only)       Balance Overall balance assessment: Needs assistance Sitting-balance support: Feet supported;No upper extremity supported Sitting balance-Leahy Scale: Good     Standing balance support: During functional activity Standing balance-Leahy Scale: Fair Standing balance comment: Able to reach into bag and grab items without difficulty.                             Pertinent Vitals/Pain Pain Assessment: 0-10 Pain Score: 4  Pain Location: back incision site Pain Descriptors / Indicators: Sore;Operative site guarding Pain Intervention(s): Repositioned;Monitored during session    [place] expects to be discharged to:: Private residence Living Arrangements: Spouse/significant other Available Help at Discharge: Family;Available 24 hours/day Type of Home: House Home Access: Stairs to enter Entrance Stairs-Rails: Psychiatric nurse of Steps: 5 Home Layout: One level Home Equipment: Cane - single point;Bedside commode;Walker - 2 wheels;Crutches      Prior Function Level of Independence: Independent         Comments: independent for ADLs, cooks/cleans, drives. Wife will be working from home to assist.     Hand Dominance   Dominant Hand: Right    Extremity/Trunk  Assessment  Upper Extremity Assessment Upper Extremity Assessment: Defer to OT evaluation    Lower Extremity Assessment Lower Extremity Assessment: Overall WFL for tasks assessed    Cervical / Trunk Assessment Cervical / Trunk Assessment: Other exceptions Cervical / Trunk Exceptions: s/p back surgery  Communication   Communication: No difficulties  Cognition Arousal/Alertness: Awake/alert Behavior During Therapy: WFL for tasks assessed/performed Overall Cognitive Status: Within Functional Limits for tasks assessed                                 General Comments: pt demonstrated good awareness of back precuations;able to problem solve through hypothetical scenarios for adherence to back precautions      General Comments General comments (skin integrity, edema, etc.): vss;    Exercises     Assessment/Plan    PT Assessment [place] does not need any further PT services  PT Problem List Decreased strength;Pain;Decreased skin integrity;Decreased knowledge of precautions       PT Treatment Interventions      PT Goals (Current goals can be found in the Care Plan section)  Acute Rehab PT Goals Patient Stated Goal: to be able to mow my grass PT Goal Formulation: All assessment and education complete, DC therapy    Frequency     Barriers to discharge        Co-evaluation               AM-PAC PT "6 Clicks" Mobility  Outcome Measure Help needed turning from your back to your side while in a flat bed without using bedrails?: None Help needed moving from lying on your back to sitting on the side of a flat bed without using bedrails?: A Little Help needed moving to and from a bed to a chair (including a wheelchair)?: None Help needed standing up from a chair using your arms (e.g., wheelchair or bedside chair)?: None Help needed to walk in hospital room?: A Little Help needed climbing 3-5 steps with a railing? : A Little 6 Click Score: 21    End of  Session Equipment Utilized During Treatment: Back brace Activity Tolerance: Patient tolerated treatment well Patient left: with call bell/phone within reach;Other (comment)(standing in room) Nurse Communication: Mobility status PT Visit Diagnosis: Pain;Difficulty in walking, not elsewhere classified (R26.2) Pain - part of body: (back)    Time: [id number] PT Time Calculation (min) (ACUTE ONLY): 15 min   Charges:   PT Evaluation $PT Eval Moderate Complexity: 1 Mod          Marisa Severin, PT, DPT Acute Rehabilitation Services Pager [phone] Office [phone]      [place] 09/11/2019, 9:56 AM

## 2019-09-12 ENCOUNTER — Other Ambulatory Visit: Payer: Self-pay | Admitting: Internal Medicine

## 2019-09-12 NOTE — Telephone Encounter (Signed)
Please refill as per office routine med refill policy (all routine meds refilled for 3 mo or monthly per pt preference up to one year from last visit, then month to month grace period for 3 mo, then further med refills will have to be denied)  

## 2019-09-21 ENCOUNTER — Other Ambulatory Visit: Payer: Self-pay | Admitting: *Deleted

## 2019-09-21 ENCOUNTER — Encounter: Payer: Self-pay | Admitting: *Deleted

## 2019-09-21 NOTE — Patient Outreach (Signed)
[facility]) Care Management  09/21/2019  Landis Varon Holsonback [birth year] [id number]  EMMI-general discharge, d/c from [place] referred 09/14/19 RED ON [place] Day # 1 Date: 09/13/19 1013  Red Alert Reason: Questions about discharge papers?Yes Other questions/problems?Yes  Insurance:  [place] [facility])  [facility] admissions x 1 ED visits x 1 in the last 6 months    Outreach attempt # 1 to   [phone] (H) listed on EMMI referral  No answer. [facility] RN CM left HIPAA [facility] Portability and Accountability Act) compliant voicemail message along with CM's contact info.   Plan: [facility] RN CM sent an unsuccessful outreach letter and scheduled this patient for another call attempt within 4 business days   Joanie Duprey L. Lavina Hamman, RN, BSN, [place] Coordinator Office number [phone] Mobile number [phone]  Main [facility] number [id number] Fax number [phone]

## 2019-09-25 DIAGNOSIS — M7062 Trochanteric bursitis, left hip: Secondary | ICD-10-CM | POA: Insufficient documentation

## 2019-09-26 ENCOUNTER — Other Ambulatory Visit: Payer: Self-pay

## 2019-09-26 ENCOUNTER — Encounter: Payer: Self-pay | Admitting: *Deleted

## 2019-09-26 ENCOUNTER — Other Ambulatory Visit: Payer: Self-pay | Admitting: *Deleted

## 2019-09-26 NOTE — Patient Outreach (Signed)
[facility]) Care Management  09/26/2019  [name] [birth year] [id number]   EMMI-general discharge, d/c from [facility] referred 09/14/19 RED ON [place] Day # 1 Date: 09/13/19 1013  Red Alert Reason: Questions about discharge papers?Yes Other questions/problems? Yes  Insurance:  [place] care [facility])  [facility] admissions x 1 ED visits x 1 in the last 6 months    Outreach attempt # 1   EMMI Mr Kozicki states he did have concerns with the listed ordered pain medication dose upon his discharge He reports he had been given a prescription for "only forty pills" He states he was without oxycodone for "four to five days" prior to his follow up visit with Dr Ronnald Ramp, Orthopedic provider who completed the spin lumbar fusion  He reports "understanding what a Junkie feels like" He confirms he did not have breakthrough pain medicine. He reports outreach to Dr Ronnald Ramp office at intervals. He confirms he is on a pain contract with his pain management MD as he has been on oxycodone for "six years"  He reports today is is better and his issues have been resolved  He reports being back on his pain medicine of 10 mg oxycodone    Pain level is a 3 today Was a 5-6 before the surgergy Provided support and discussed with him an action plan to maintain pain to include his medicine and activity [facility] patient experience number was offered He did see Dr Ronnald Ramp on Monday, Sep 24 2019 He reports he was not aware that he could have outreached to his pain management  Dr Judson Roch Has not seen dr [name] in a while only see for yearly physical  List of provider   Plans [facility] [place] RN CM will follow up with Mr Pascuzzi with in the next 14-21 business days as he agreed Pt encouraged to return a call to [facility] RN CM prn Routed note to MD [facility] RN CM sent a Gaffer with [facility] brochure, [place], [facility] consent form with return envelope and know before you go sheet enclosed for  review  [facility] RN CM sent a  MD barriers involvement letter to Dr [name]  [facility] CM Care Plan Problem One     Most Recent Value  Care Plan Problem One Risk for hospital readmission after spinal fusion  Role Documenting the Problem One Care Management Telephonic Coordinator  Care Plan for Problem One Active  [facility] Long Term Goal  Over the next 90 days, patient will not be hospitalized for complications related to chronic illnesses.  [facility] Long Term Goal Start Date 09/26/19  Interventions for Problem One Long Term Goal completed EMMI assessment, discussed [facility], services sent EMMIs, [facility] welcome letteranswered questiosn discussed pian management action plan, provided support, provided [facility] experience number  [facility] CM Short Term Goal #1  Over the next 31 days, patient will not experience hospital readmission, as evidence by patient reporting and review of EMR during [facility] RN CCM outreach  [facility] CM Short Term Goal #1 Start Date 09/26/19  Interventions for Short Term Goal #1 completed EMMI assessment, discussed [facility]N, services sent EMMIs, [facility]N welcome letteranswered questiosn discussed pian management action plan, provided support, provided [facility] experience number      Zameer Borman L. Lavina Hamman, RN, BSN, [place] Coordinator Office number [phone] Mobile number [phone]  Main [facility] number [phone] Fax number [phone]

## 2019-10-10 ENCOUNTER — Ambulatory Visit: Payer: Self-pay | Admitting: *Deleted

## 2019-10-11 ENCOUNTER — Other Ambulatory Visit: Payer: Self-pay | Admitting: *Deleted

## 2019-10-11 NOTE — Patient Outreach (Signed)
[facility]) Care Management  10/11/2019  [name] Sep 04, 1960 [id number]   [facility] outreach to [facility] general discharged referred patient  [name] was referred to [facility] on 09/13/19 EMMI concerns with questions about discharge papers and other questions (pain medications) Insurance:[employer] [facility])  [facility] admissions x1ED visits x 1in the last 6 months    Outreach attempt unsuccessful  No answer. [facility] RN CM left HIPAA [facility] Portability and Accountability Act) compliant voicemail message along with CM's contact info.   Plan: [facility] RN CM scheduled this [facility] engaged patient for another call attempt within 4-7 business days  Hindy Perrault L. Lavina Hamman, RN, BSN, [place] Coordinator Office number [phone] Mobile number [phone]  Main [facility] number [phone] Fax number [phone]

## 2019-10-17 ENCOUNTER — Ambulatory Visit: Payer: Self-pay | Admitting: *Deleted

## 2019-10-23 ENCOUNTER — Other Ambulatory Visit: Payer: Self-pay | Admitting: *Deleted

## 2019-10-23 ENCOUNTER — Other Ambulatory Visit: Payer: Self-pay

## 2019-10-23 DIAGNOSIS — M545 Low back pain: Secondary | ICD-10-CM | POA: Diagnosis not present

## 2019-10-23 NOTE — Patient Outreach (Signed)
[facility]) Care Management  10/23/2019  [name] 1960/07/24 [id number]   [facility] outreach to [name] referred patient  [name] was referred to [name] on 09/13/19 EMMI concerns with questions about discharge papers and other questions (pain medications) resolved Insurance:[employer] care [facility])  [facility] admissions x1ED visits x 1in the last 6 months    [facility] RN CM spoke with [name] briefly to remind him of [facility] RN CM previous outreach with agreed follow up outreach [name] reports he is getting ready to go to a MD appointment and agrees to a later call from [name] RN CM will attempt another outreach to [name] at a more convenient time for him      Richard Millin L. Lavina Hamman, RN, BSN, [place] Coordinator Office number [phone] Mobile number [phone]  Main [facility] number [phone] Fax number [phone]

## 2019-10-29 ENCOUNTER — Ambulatory Visit: Payer: Medicare Other | Admitting: Family Medicine

## 2019-10-29 ENCOUNTER — Other Ambulatory Visit: Payer: Self-pay

## 2019-10-29 ENCOUNTER — Encounter: Payer: Self-pay | Admitting: Family Medicine

## 2019-10-29 DIAGNOSIS — M25562 Pain in left knee: Secondary | ICD-10-CM | POA: Diagnosis not present

## 2019-10-29 DIAGNOSIS — G8929 Other chronic pain: Secondary | ICD-10-CM

## 2019-10-29 DIAGNOSIS — M25561 Pain in right knee: Secondary | ICD-10-CM | POA: Diagnosis not present

## 2019-10-29 NOTE — Assessment & Plan Note (Signed)
Social determinants of health including patient's recent back surgery.  I do not feel that adding medications will be beneficial at the moment especially with patient being maxed out on the gabapentin, Cymbalta and the meloxicam.  Patient does have chronic problem with exacerbation.  Discussed all the other medicines and given injections.  Could be a candidate for potential viscosupplementation.  Possible need for repeating of imaging.  Follow-up again in 4 to 8 weeks

## 2019-10-29 NOTE — Progress Notes (Signed)
[place] [address] [place] Phone: [phone] Subjective:   I [name] am serving as a Education administrator for Dr. Hulan [name].  This visit occurred during the SARS-CoV-2 public health emergency.  Safety protocols were in place, including screening questions prior to the visit, additional usage of staff PPE, and extensive cleaning of exam room while observing appropriate contact time as indicated for disinfecting solutions.   I'm seeing this patient by the request  of:  Richard Borg, MD  CC: Bilateral knee pain  VEL:[id number]   04/03/2019 Patient given injection today, tolerated the procedure well, discussed icing regimen and home exercise, discussed which activities to do which wants to avoid.  Discussed proper shoes and over-the-counter orthotics as well as the custom orthotics patient has had previously.  Do not believe the patient needs surgical intervention and will continue to do well with the intermittent injections.  Update 10/29/2019 [name] is a 59 y.o. male coming in with complaint of bilateral knee pain and pain in plantar fascia. Lumbar fusion 09/10/2019. Patient would like bilateral injection. Patient is recovering from back surgery.  Patient states that now trying to be more active and finding that the knees are hurting her more frequently.  Describes the pain as a dull, throbbing aching sensation.  Patient states some increasing instability as well.  Has noticed maybe some mild increase in swelling as well.      Past Medical History:  Diagnosis Date  . Allergy   . Anxiety   . Arthritis    Back  . Chronic back pain   . Colon polyps   . COPD (chronic obstructive pulmonary disease) ([place])   . Depression   . GERD (gastroesophageal reflux disease)    every now and then  . Hyperlipidemia 09/26/2015  . Lumbar surgical wound fluid collection    lumbar wound dehiscence  . Pneumonia 2020   Past Surgical History:   Procedure Laterality Date  . BACK SURGERY    . COLONOSCOPY    . COLONOSCOPY W/ BIOPSIES AND POLYPECTOMY    . EYE SURGERY    . KNEE ARTHROSCOPY WITH MEDIAL MENISECTOMY Left 01/23/2016   Procedure: KNEE ARTHROSCOPY WITH MEDIAL MENISECTOMY;  Surgeon: Richard Nakayama, MD;  Location: [facility];  Service: Orthopedics;  Laterality: Left;  . LUMBAR WOUND DEBRIDEMENT N/A 03/21/2014   Procedure: LUMBAR WOUND DEBRIDEMENT;  Surgeon: Richard Moore, MD;  Location: [place] [facility];  Service: Neurosurgery;  Laterality: N/A;  . MAXIMUM ACCESS (MAS)POSTERIOR LUMBAR INTERBODY FUSION (PLIF) 1 LEVEL N/A 02/14/2014   Procedure: FOR MAXIMUM ACCESS SURGERY POSTERIOR LUMBAR INTERBODY FUSION LUMBAR TWO-THREE;  Surgeon: Richard Moore, MD;  Location: [place] [facility];  Service: Neurosurgery;  Laterality: N/A;  . NECK SURGERY    . SHOULDER SURGERY    . SPINE SURGERY    . TONSILLECTOMY     Social History   Socioeconomic History  . Marital status: Married    Spouse name: [name]  . Number of children: Not on file  . Years of education: Not on file  . Highest education level: Not on file  Occupational History  . Occupation: produce manager/ disability    Employer: [employer]  Tobacco Use  . Smoking status: Current Every Day Smoker    Packs/day: 0.50    Years: 44.00    Pack years: 22.00    Types: Cigarettes  . Smokeless tobacco: Current User    Types: Snuff  Vaping  Use  . Vaping Use: Never used  Substance and Sexual Activity  . Alcohol use: Yes    Alcohol/week: 24.0 standard drinks    Types: 24 Standard drinks or equivalent per week    Comment: social beer  . Drug use: No  . Sexual activity: Yes    Partners: Female  Other Topics Concern  . Not on file  Social History Narrative   Exercise--no   Social Determinants of Health   Financial Resource Strain:   . Difficulty of Paying Living Expenses:   Food Insecurity:   . Worried About Charity fundraiser in the Last Year:   . Academic librarian in the Last Year:   Transportation Needs:   . Film/video editor (Medical):   Marland Kitchen Lack of Transportation (Non-Medical):   Physical Activity:   . Days of Exercise per Week:   . Minutes of Exercise per Session:   Stress:   . Feeling of Stress :   Social Connections:   . Frequency of Communication with Friends and Family:   . Frequency of Social Gatherings with Friends and Family:   . Attends Religious Services:   . Active Member of Clubs or Organizations:   . Attends Archivist Meetings:   Marland Kitchen Marital Status:    Allergies  Allergen Reactions  . Cefuroxime Axetil Hives   Family History  Problem Relation Age of Onset  . Colon cancer Father 74       pt thinks father had colon cancer  . Stomach cancer Father   . Hypertension Mother   . Colon polyps Brother   . Aneurysm Maternal Aunt 58       brain  . Heart disease Maternal Aunt        d 56  . Stomach cancer Paternal Uncle 43  . Aneurysm Maternal Grandfather        stomach  . Colon cancer Maternal Grandfather        thinks grandfather had colon cancer  . Stomach cancer Paternal Grandfather 61  . Depression Brother 27  . Suicidality Brother   . Stomach cancer Paternal Uncle 66  . Esophageal cancer Neg Hx   . Rectal cancer Neg Hx   . Diabetes Neg Hx      Current Outpatient Medications (Cardiovascular):  .  atorvastatin (LIPITOR) 10 MG tablet, TAKE 1 TABLET BY MOUTH EVERY DAY  Current Outpatient Medications (Respiratory):  .  albuterol (VENTOLIN HFA) 108 (90 Base) MCG/ACT inhaler, Inhale 1-2 puffs into the lungs every 6 (six) hours as needed for wheezing or shortness of breath. .  triamcinolone (NASACORT) 55 MCG/ACT AERO nasal inhaler, Place 2 sprays into the nose daily. .  fluticasone (FLONASE) 50 MCG/ACT nasal spray, Place 1-2 sprays into both nostrils daily for 10 days. (Patient taking differently: Place 1-2 sprays into both nostrils daily as needed for allergies. )  Current Outpatient Medications  (Analgesics):  .  meloxicam (MOBIC) 15 MG tablet, Take 1 tablet (15 mg total) by mouth daily. (Patient taking differently: Take 15 mg by mouth at bedtime. ) .  Oxycodone HCl 10 MG TABS, Take 1-2 tablets (10-20 mg total) by mouth every 4 (four) hours as needed (pain).   Current Outpatient Medications (Other):  Marland Kitchen  DULoxetine (CYMBALTA) 60 MG capsule, Take 60 mg by mouth daily.  Marland Kitchen  gabapentin (NEURONTIN) 300 MG capsule, Take 1 capsule (300 mg total) by mouth 3 (three) times daily. (Patient taking differently: Take 300 mg by mouth 4 (four)  times daily. ) .  Multiple Vitamin (MULTIVITAMIN WITH MINERALS) TABS tablet, Take 1 tablet by mouth daily. Marland Kitchen  tiZANidine (ZANAFLEX) 4 MG tablet, Take 4 mg by mouth 3 (three) times daily.    Reviewed prior external information including notes and imaging from  primary care provider As well as notes that were available from care everywhere and other healthcare systems.  Past medical history, social, surgical and family history all reviewed in electronic medical record.  No pertanent information unless stated regarding to the chief complaint.   Review of Systems:  No headache, visual changes, nausea, vomiting, diarrhea, constipation, dizziness, abdominal pain, skin rash, fevers, chills, night sweats, weight loss, swollen lymph nodes, body aches,, chest pain, shortness of breath, mood changes. POSITIVE muscle aches, joint swelling  Objective  Blood pressure (!) 150/80, pulse (!) 105, height 5\' 11"  (1.803 m), weight 272 lb (123.4 kg), SpO2 96 %.   General: No apparent distress alert and oriented x3 mood and affect normal, dressed appropriately.  Patient does have significant amount of bruising noted on the upper extremities.  Patient is wearing a back brace HEENT: Pupils equal, extraocular movements intact  Respiratory: Patient's speak in full sentences and does not appear short of breath  Cardiovascular: 1+ lower extremity edema, non tender, no erythema  Neuro:  Cranial nerves II through XII are intact, neurovascularly intact in all extremities with 2+ DTRs and 2+ pulses.  Gait antalgic MSK:    Knee: Bilateral Mild valgus deformity noted.  Abnormal thigh to calf ratio.  Tender to palpation over medial and PF joint line.  ROM full in flexion and extension and lower leg rotation. painful patellar compression. Patellar glide with moderate crepitus. Patellar and quadriceps tendons unremarkable. Hamstring and quadriceps strength is normal.   After informed written and verbal consent, patient was seated on exam table. Right knee was prepped with alcohol swab and utilizing anterolateral approach, patient's right knee space was injected with 4:1  marcaine 0.5%: Kenalog 40mg /dL. Patient tolerated the procedure well without immediate complications.  After informed written and verbal consent, patient was seated on exam table. Left knee was prepped with alcohol swab and utilizing anterolateral approach, patient's left knee space was injected with 4:1  marcaine 0.5%: Kenalog 40mg /dL. Patient tolerated the procedure well without immediate complications.    Impression and Recommendations:     The above documentation has been reviewed and is accurate and complete Lyndal Pulley, DO       Note: This dictation was prepared with Dragon dictation along with smaller phrase technology. Any transcriptional errors that result from this process are unintentional.

## 2019-10-29 NOTE — Patient Instructions (Addendum)
Good to see you Pennsaid small amount over most painful spot If cramping doesn't get better we will get labs  See me again in 2 months

## 2019-11-06 ENCOUNTER — Ambulatory Visit: Payer: Self-pay | Admitting: *Deleted

## 2019-11-07 ENCOUNTER — Ambulatory Visit: Payer: Self-pay | Admitting: *Deleted

## 2019-11-12 DIAGNOSIS — M7062 Trochanteric bursitis, left hip: Secondary | ICD-10-CM | POA: Diagnosis not present

## 2019-11-12 DIAGNOSIS — M545 Low back pain: Secondary | ICD-10-CM | POA: Diagnosis not present

## 2019-11-12 DIAGNOSIS — R030 Elevated blood-pressure reading, without diagnosis of hypertension: Secondary | ICD-10-CM | POA: Diagnosis not present

## 2019-11-21 ENCOUNTER — Ambulatory Visit: Payer: Self-pay | Admitting: *Deleted

## 2019-11-28 ENCOUNTER — Ambulatory Visit: Payer: Medicare Other | Admitting: Podiatrist

## 2019-11-28 ENCOUNTER — Other Ambulatory Visit: Payer: Self-pay

## 2019-11-28 ENCOUNTER — Encounter: Payer: Self-pay | Admitting: Podiatrist

## 2019-11-28 ENCOUNTER — Ambulatory Visit (INDEPENDENT_AMBULATORY_CARE_PROVIDER_SITE_OTHER): Payer: Medicare Other

## 2019-11-28 ENCOUNTER — Other Ambulatory Visit: Payer: Self-pay | Admitting: Podiatrist

## 2019-11-28 DIAGNOSIS — M79672 Pain in left foot: Secondary | ICD-10-CM

## 2019-11-28 DIAGNOSIS — M79671 Pain in right foot: Secondary | ICD-10-CM

## 2019-11-28 DIAGNOSIS — M778 Other enthesopathies, not elsewhere classified: Secondary | ICD-10-CM

## 2019-11-28 DIAGNOSIS — G5762 Lesion of plantar nerve, left lower limb: Secondary | ICD-10-CM

## 2019-11-28 DIAGNOSIS — G5761 Lesion of plantar nerve, right lower limb: Secondary | ICD-10-CM | POA: Diagnosis not present

## 2019-11-28 NOTE — Patient Instructions (Signed)
Call in a week if the pain has not improved-  I would recommend increasing your gabapentin to 600 three times a day to see if that helps

## 2019-11-30 ENCOUNTER — Ambulatory Visit: Payer: Self-pay | Admitting: *Deleted

## 2019-12-04 NOTE — Progress Notes (Signed)
Chief Complaint  Patient presents with  . Foot Pain    pt is here for bil foot pain. Pt also states that the toe pain is elevated when walking on it. pt also states that he has a numbness, tingling, as well as a pins and needles sensation.     HPI: Patient is 59 y.o. male who presents today for the concerns as listed above. He relates specific pain in the second interspaces bilaterally.  He has neuropathy and is currently taking gabapentin 300mg , tid.    Current Outpatient Medications on File Prior to Visit  Medication Sig Dispense Refill  . albuterol (VENTOLIN HFA) 108 (90 Base) MCG/ACT inhaler Inhale 1-2 puffs into the lungs every 6 (six) hours as needed for wheezing or shortness of breath. 18 g 11  . atorvastatin (LIPITOR) 10 MG tablet TAKE 1 TABLET BY MOUTH EVERY DAY 90 tablet 0  . DULoxetine (CYMBALTA) 60 MG capsule Take 60 mg by mouth daily.   2  . gabapentin (NEURONTIN) 300 MG capsule Take 1 capsule (300 mg total) by mouth 3 (three) times daily. (Patient taking differently: Take 300 mg by mouth 4 (four) times daily. ) 90 capsule 3  . meloxicam (MOBIC) 15 MG tablet Take 1 tablet (15 mg total) by mouth daily. (Patient taking differently: Take 15 mg by mouth at bedtime. ) 90 tablet 3  . Multiple Vitamin (MULTIVITAMIN WITH MINERALS) TABS tablet Take 1 tablet by mouth daily.    . Oxycodone HCl 10 MG TABS Take 1-2 tablets (10-20 mg total) by mouth every 4 (four) hours as needed (pain). 40 tablet 0  . tiZANidine (ZANAFLEX) 4 MG tablet Take 4 mg by mouth 3 (three) times daily.   2  . triamcinolone (NASACORT) 55 MCG/ACT AERO nasal inhaler Place 2 sprays into the nose daily. 1 Inhaler 12  . fluticasone (FLONASE) 50 MCG/ACT nasal spray Place 1-2 sprays into both nostrils daily for 10 days. (Patient taking differently: Place 1-2 sprays into both nostrils daily as needed for allergies. ) 1 g 11   No current facility-administered medications on file prior to visit.      Review of Systems No  fevers, chills, nausea, muscle aches, no difficulty breathing, no calf pain, no chest pain or shortness of breath.   Physical Exam  GENERAL APPEARANCE: Alert, conversant. Appropriately groomed. No acute distress.   VASCULAR: Pedal pulses palpable DP and PT bilateral.  Capillary refill time is immediate to all digits,  Proximal to distal cooling it warm to warm.  Digital perfusion adequate.   NEUROLOGIC: sensation is intact epicritically and protectively to 5.07 monofilament at 5/5 sites bilateral.  Light touch is intact bilateral, vibratory sensation intact bilateral, achilles tendon reflex is intact bilateral.  Pain on palpation and with medial to lateral squeezing of foot at second interspace bilaterally.  No palpable click noted.    MUSCULOSKELETAL: acceptable muscle strength, tone and stability bilateral.  No gross boney pedal deformities noted.  No pain, crepitus or limitation noted with foot and ankle range of motion bilateral.   DERMATOLOGIC: skin is warm, supple, and dry.  No open lesions noted.  No rash, no pre ulcerative lesions. Digital nails are asymptomatic.      Assessment     ICD-10-CM   1. Foot pain, bilateral  M79.671 CANCELED: DG Foot Complete Right   M79.672 CANCELED: DG Foot Complete Left  2. Neuroma of second interspace of left foot  G57.62   3. Neuroma of second interspace of right foot  G57.61      Plan  Discussed treatment options and alternatives.  Discussed the pain could be from the neuropathy and it could also be a possible neuroma 2nd interspace.  Recommended a steroid injection as a diagnostic and therapeutic tool.  Patient agreed and second interspace right and left were cleansed with alcohol.  An injection consisting of 10mg  kenalog and marcaine plain were infiltrated into the area of maximal tenderness 2nd interspace bilaterally.  He tolerated the procedure well.  Also recommended increasing the gabapentin dosage 600mg  tid and titrating it up slowly.  He  will be seen back for a follow up in 4-6 weeks.

## 2019-12-05 ENCOUNTER — Other Ambulatory Visit: Payer: Self-pay | Admitting: Internal Medicine

## 2019-12-05 NOTE — Telephone Encounter (Signed)
Please refill as per office routine med refill policy (all routine meds refilled for 3 mo or monthly per pt preference up to one year from last visit, then month to month grace period for 3 mo, then further med refills will have to be denied)  

## 2019-12-21 ENCOUNTER — Other Ambulatory Visit: Payer: Self-pay | Admitting: Internal Medicine

## 2019-12-21 NOTE — Telephone Encounter (Signed)
Please refill as per office routine med refill policy (all routine meds refilled for 3 mo or monthly per pt preference up to one year from last visit, then month to month grace period for 3 mo, then further med refills will have to be denied)  

## 2019-12-25 DIAGNOSIS — M545 Low back pain: Secondary | ICD-10-CM | POA: Diagnosis not present

## 2020-01-08 DIAGNOSIS — R200 Anesthesia of skin: Secondary | ICD-10-CM | POA: Insufficient documentation

## 2020-01-08 DIAGNOSIS — M545 Low back pain: Secondary | ICD-10-CM | POA: Diagnosis not present

## 2020-01-08 DIAGNOSIS — R202 Paresthesia of skin: Secondary | ICD-10-CM | POA: Diagnosis not present

## 2020-01-08 DIAGNOSIS — M48061 Spinal stenosis, lumbar region without neurogenic claudication: Secondary | ICD-10-CM | POA: Diagnosis not present

## 2020-01-08 DIAGNOSIS — M7062 Trochanteric bursitis, left hip: Secondary | ICD-10-CM | POA: Diagnosis not present

## 2020-01-16 IMAGING — CT CT CHEST WITH CONTRAST
2 of 5 series · 14 of 46 positions shown, 16 images · IV contrast (OMNIPAQUE 300)
Comparison: Chest x-ray dated 07/17/2018 and CT scan of the lumbar
spine dated 01/09/2014

CLINICAL DATA: Persistent shortness of breath since pneumonia
several months ago. Microhematuria. Bilateral hip pain.

EXAM:
CT CHEST, ABDOMEN, AND PELVIS WITH CONTRAST
TECHNIQUE: Multidetector CT imaging of the chest, abdomen and pelvis was
performed following the standard protocol during bolus
administration of intravenous contrast.
CONTRAST:  100mL OMNIPAQUE IOHEXOL 300 MG/ML  SOLN

[Series 2: cap with · axial · 0.90mm/px · z∈[-682,-122]mm · 11 of 136 slices shown, 13 images]
[im 12/136  soft-tissue]
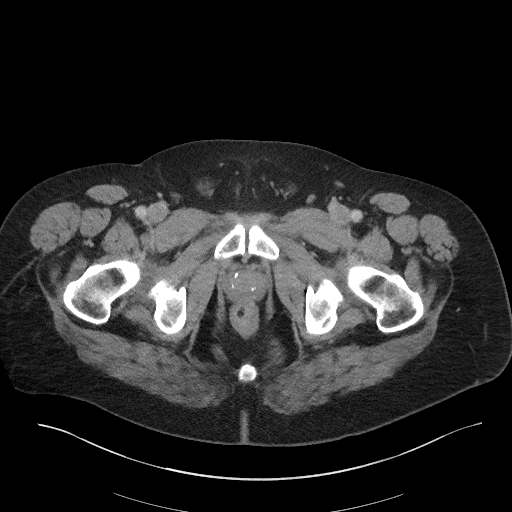
[im 12/136  bone]
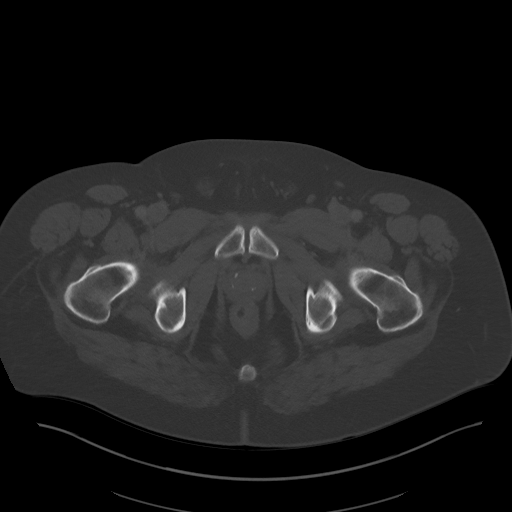
[im 23/136  soft-tissue]
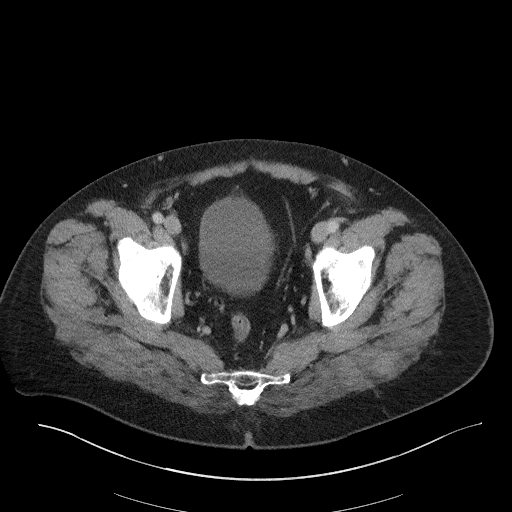
[im 34/136  soft-tissue]
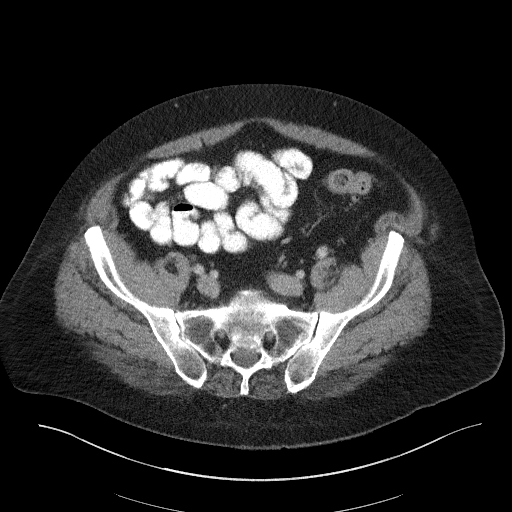
[im 46/136  soft-tissue]
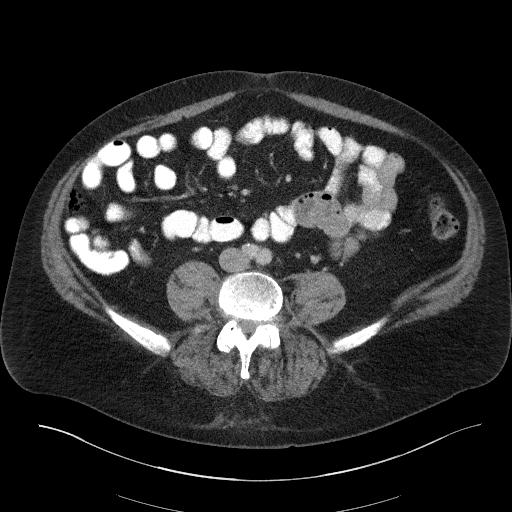
[im 57/136  soft-tissue]
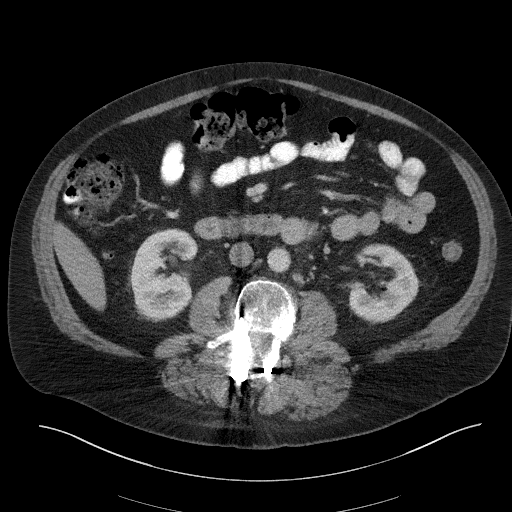
[im 68/136  soft-tissue]
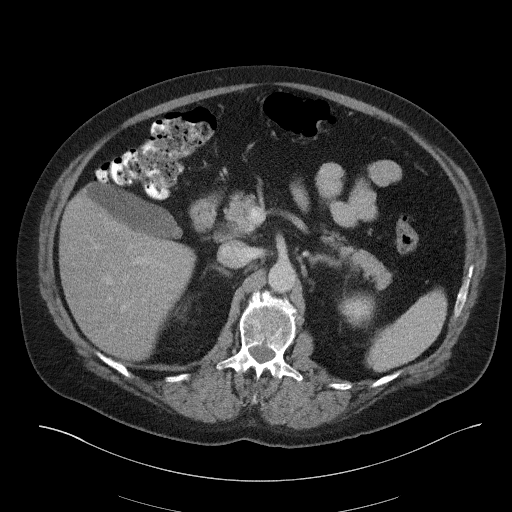
[im 79/136  soft-tissue]
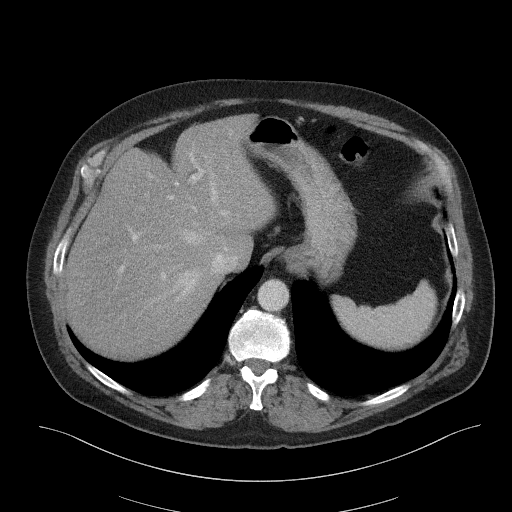
[im 91/136  soft-tissue]
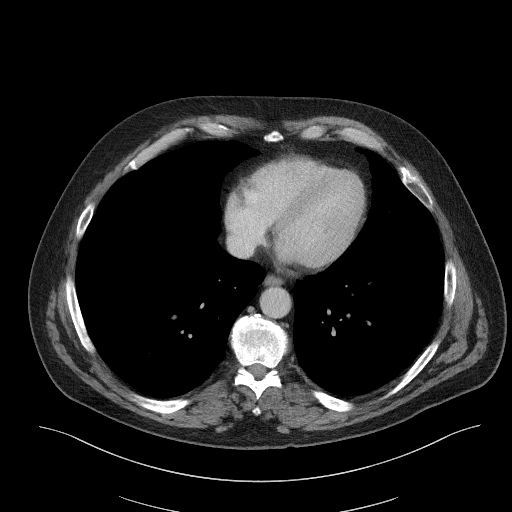
[im 102/136  soft-tissue]
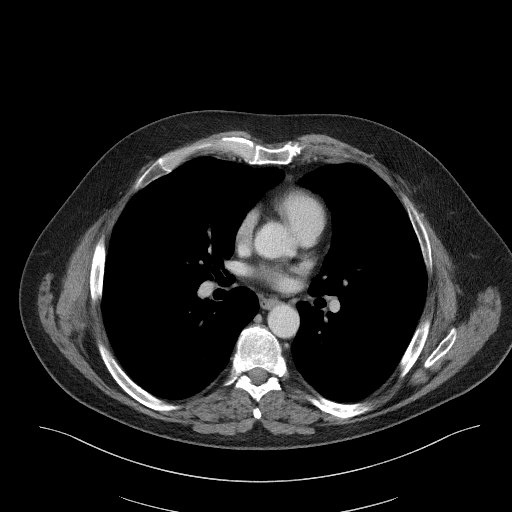
[im 102/136  bone]
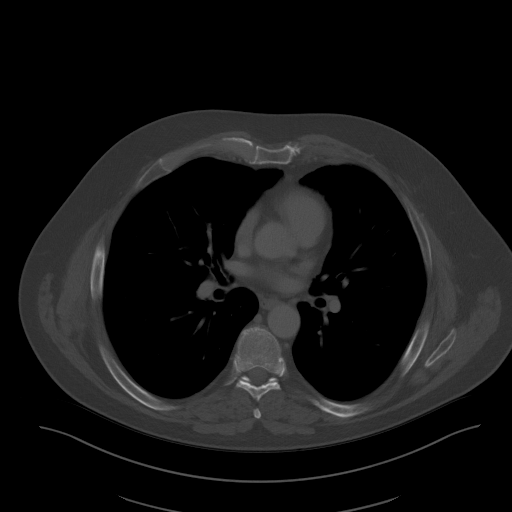
[im 113/136  soft-tissue]
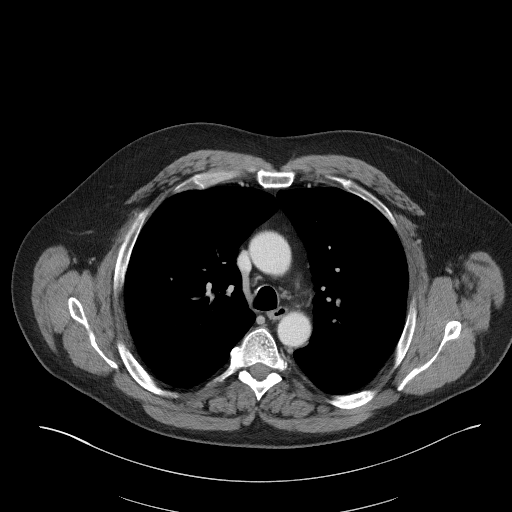
[im 124/136  soft-tissue]
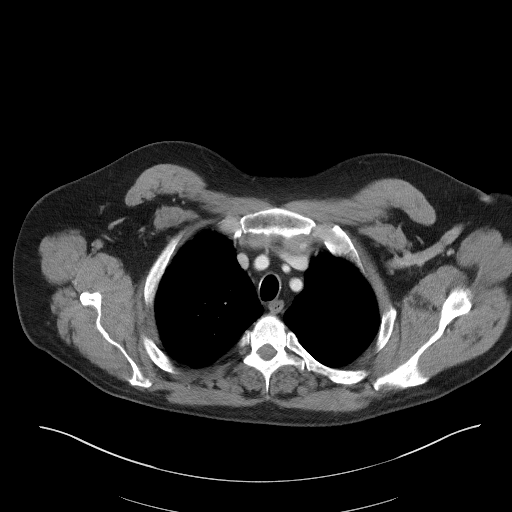

[Series 6: coronal · coronal · 0.86mm/px · 3 of 160 slices shown]
[im 54/160  soft-tissue]
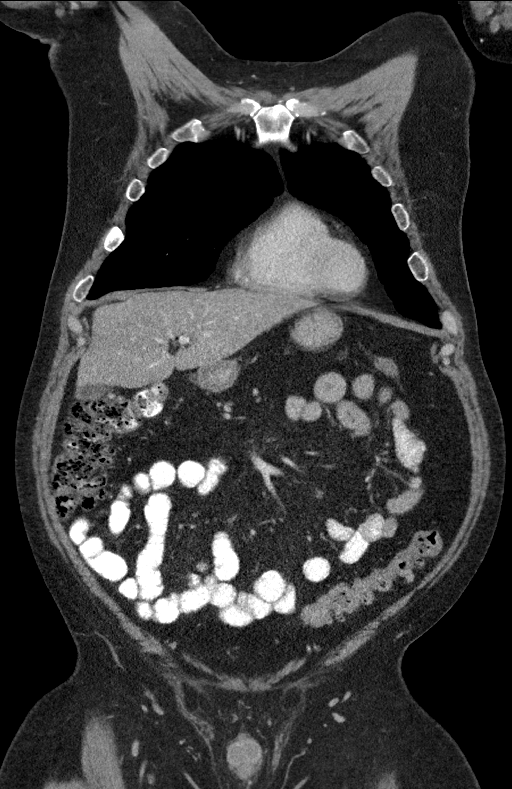
[im 71/160  soft-tissue]
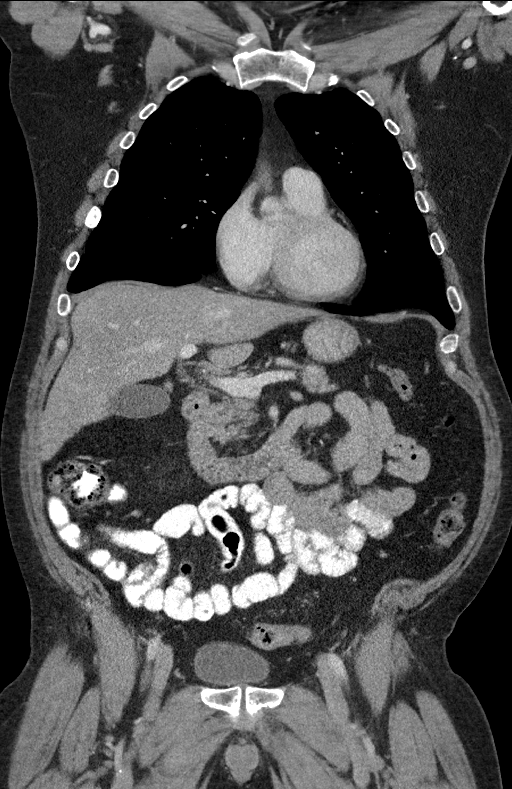
[im 89/160  soft-tissue]
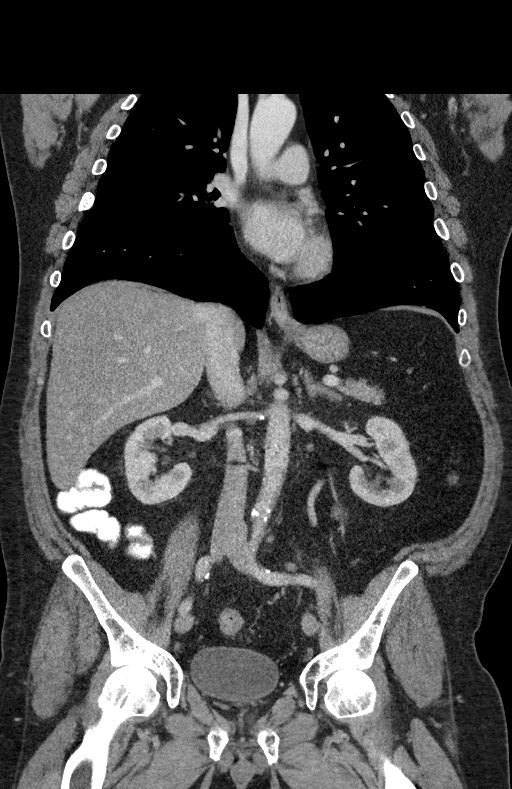

[14 of 46 positions shown; findings below may reference images not displayed]

FINDINGS: CT CHEST FINDINGS

Cardiovascular: No significant vascular findings. Normal heart size.
No pericardial effusion.

Mediastinum/Nodes: No enlarged mediastinal, hilar, or axillary lymph
nodes. Thyroid gland, trachea, and esophagus demonstrate no
significant findings.

Lungs/Pleura: There are several small emphysematous blebs at both
lung apices. There is slight bilateral peribronchial thickening in
all lobes. The lungs are otherwise clear. No effusions.

Musculoskeletal: No chest wall mass or suspicious bone lesions
identified.

CT ABDOMEN PELVIS FINDINGS

Hepatobiliary: No focal liver abnormality is seen. No gallstones,
gallbladder wall thickening, or biliary dilatation.

Pancreas: Unremarkable. No pancreatic ductal dilatation or
surrounding inflammatory changes.

Spleen: Normal in size without focal abnormality.

Adrenals/Urinary Tract: Normal adrenal glands. 2 mm calcification in
the mid left kidney. Kidneys are otherwise normal. Ureters appear
normal. Bladder appears normal.

Stomach/Bowel: There are few diverticula in the sigmoid portion of
the colon. The bowel otherwise appears normal including the terminal
ileum and appendix.

Vascular/Lymphatic: Aortic atherosclerosis. No enlarged abdominal or
pelvic lymph nodes.

Reproductive: Prostate is unremarkable.

Other: No abdominal wall hernia or abnormality. No abdominopelvic
ascites.

Musculoskeletal: Previous interbody and posterior fusion at L2-3.
The fusion is solid. Degenerative disc and joint disease at L4-5 and
L5-S1. There is narrowing of the spinal canal at L4-5, progressed as
compared to the CT myelogram dated 12/20/2014.

There are only minimal degenerative changes of the hips.
IMPRESSION: 1. Minimal emphysematous blebs at both lung apices. Diffuse slight
peribronchial thickening.
2. No acute abnormalities of the abdomen or pelvis. Specifically, no
findings to indicate the cause of the patient's hematuria. Single
tiny stone in the mid left kidney.
3. Progressive compression of the thecal sac at L4-5 due to
hypertrophy of the facet joints and ligamentum flavum.

## 2020-01-25 ENCOUNTER — Encounter: Payer: Self-pay | Admitting: *Deleted

## 2020-01-25 ENCOUNTER — Other Ambulatory Visit: Payer: Self-pay | Admitting: *Deleted

## 2020-01-25 ENCOUNTER — Other Ambulatory Visit: Payer: Self-pay

## 2020-01-25 NOTE — Patient Outreach (Signed)
[name]) Care Management  01/25/2020  [name] Mar 17, 1961 [id number]   [name] Case closure   [name] was referred to [name] on 09/13/19 EMMI concerns with questions about discharge papers and other questions (pain medications) resolved Insurance:[employer] [name] admissions x1ED visits x 1in the last 6 months    Call attempts made x 2 in June 2021 and today  [name] returned a call to [facility] RN CM shortly after a voice message was left Patient is able to verify HIPAA ([name] and [name]) identifiers Reviewed and addressed the purpose of the follow up call with the patient  Consent: [name] ([place]) RN CM reviewed [name] services with patient. Patient gave verbal consent for services.  He reports he is having bilateral foot, toe pain, numbness, tingling  [facility] RN CM reviewed some causes of neuropathy to include infection, diabetes, kidney disease, alcohol use, smoking, hereditary conditions,  PAD (Peripheral disease), after surgical procedures etc  He had surgery on 09/10/19  Lumbar spina fusion He takes gabapentin 300 mg tid and reports he is in pain management He continues to smoke and drink He reports a history (hx) of smoking over 40+ years He and his wife have discussed smoke cessation He reports stopping once with use of chantix  Answered various questions for [name]  EMMI education sent on neuropathy, smoking cessation, effects of alcohol on your health, heel pain caused by plantar fasciitis, what happens during a lumbar interbody fusion(posterior or transforaminal ) ? : overview, dealing with neuropathy from the drugs you take   [name] RN CM discussed [facility] EMMI services Pt confirms and without any listed major medical issues HTN, DM, COPD, CHF, CKD, Cancer, Afib, etc Case closure discussed with outreach prn   Plan [facility] RN CM will close case Case closure letters sent to  patient and MD  [name] CM Care Plan Problem One     Most Recent Value  Care Plan Problem One Risk for [name] readmission after spinal fusion  Role Documenting the Problem One Care Management Telephonic Coordinator  Care Plan for Problem One Active  [facility] Long Term Goal  Over the next 90 days, patient will not be hospitalized for complications related to chronic illnesses.  [facility] Long Term Goal Start Date 09/26/19  [facility] Long Term Goal Met Date 01/25/20  [facility] CM Short Term Goal #1  Over the next 31 days, patient will not experience [name] readmission, as evidence by patient reporting and review of EMR during [facility] RN CCM outreach  [name] CM Short Term Goal #1 Start Date 09/26/19  [name] CM Short Term Goal #1 Met Date 01/25/20      Richard Millin L. Lavina Hamman, RN, BSN, [place] Coordinator Office number [phone] Mobile number [phone]  Main [facility] number [phone] Fax number [phone]

## 2020-01-30 ENCOUNTER — Other Ambulatory Visit: Payer: Self-pay | Admitting: *Deleted

## 2020-01-30 NOTE — Patient Outreach (Signed)
[facility]) Care Management  01/30/2020  [name] 03/21/1961 [id number]   [place] coordination-EMMIs  EMMIs previously sent via e mail noted returned in [place] EMMIs via mail     [place]. Lavina Hamman, RN, BSN, [place] Coordinator Office number [phone] Mobile number [phone]  Main [facility] number [phone] Fax number [phone]

## 2020-02-06 ENCOUNTER — Other Ambulatory Visit: Payer: Self-pay

## 2020-02-06 ENCOUNTER — Ambulatory Visit
Admission: EM | Admit: 2020-02-06 | Discharge: 2020-02-06 | Disposition: A | Discharge status: Other Acute Inpatient Hospital | Payer: Medicare Other | Attending: Emergency Medicine | Admitting: Emergency Medicine

## 2020-02-06 ENCOUNTER — Encounter: Payer: Self-pay | Admitting: Emergency Medicine

## 2020-02-06 ENCOUNTER — Encounter (HOSPITAL_COMMUNITY): Payer: Self-pay | Admitting: Emergency Medicine

## 2020-02-06 ENCOUNTER — Emergency Department (HOSPITAL_COMMUNITY)
Admission: EM | Admit: 2020-02-06 | Discharge: 2020-02-06 | Disposition: A | Discharge status: Home / Self Care | Payer: Medicare Other | Attending: Emergency Medicine | Admitting: Emergency Medicine

## 2020-02-06 DIAGNOSIS — F17210 Nicotine dependence, cigarettes, uncomplicated: Secondary | ICD-10-CM | POA: Diagnosis not present

## 2020-02-06 DIAGNOSIS — J36 Peritonsillar abscess: Secondary | ICD-10-CM

## 2020-02-06 DIAGNOSIS — Z7951 Long term (current) use of inhaled steroids: Secondary | ICD-10-CM | POA: Diagnosis not present

## 2020-02-06 DIAGNOSIS — R070 Pain in throat: Secondary | ICD-10-CM | POA: Insufficient documentation

## 2020-02-06 DIAGNOSIS — Z20822 Contact with and (suspected) exposure to covid-19: Secondary | ICD-10-CM | POA: Diagnosis not present

## 2020-02-06 DIAGNOSIS — J039 Acute tonsillitis, unspecified: Secondary | ICD-10-CM | POA: Diagnosis not present

## 2020-02-06 DIAGNOSIS — Z79899 Other long term (current) drug therapy: Secondary | ICD-10-CM | POA: Diagnosis not present

## 2020-02-06 DIAGNOSIS — J029 Acute pharyngitis, unspecified: Secondary | ICD-10-CM

## 2020-02-06 DIAGNOSIS — J449 Chronic obstructive pulmonary disease, unspecified: Secondary | ICD-10-CM | POA: Diagnosis not present

## 2020-02-06 LAB — RESPIRATORY PANEL BY RT PCR (FLU A&B, COVID)
Influenza A by PCR: NEGATIVE
Influenza B by PCR: NEGATIVE
SARS Coronavirus 2 by RT PCR: NEGATIVE

## 2020-02-06 LAB — BASIC METABOLIC PANEL
Anion gap: 12 (ref 5–15)
BUN: 12 mg/dL (ref 6–20)
CO2: 25 mmol/L (ref 22–32)
Calcium: 9.8 mg/dL (ref 8.9–10.3)
Chloride: 101 mmol/L (ref 98–111)
Creatinine, Ser: 0.89 mg/dL (ref 0.61–1.24)
GFR calc non Af Amer: >60 mL/min (ref >60)
Glucose, Bld: 123 mg/dL — ABNORMAL HIGH (ref 70–99)
Potassium: 5.2 mmol/L — ABNORMAL HIGH (ref 3.5–5.1)
Sodium: 138 mmol/L (ref 135–145)

## 2020-02-06 LAB — CBC
HCT: 46.6 % (ref 39.0–52.0)
Hemoglobin: 14.8 g/dL (ref 13.0–17.0)
MCH: 30.6 pg (ref 26.0–34.0)
MCHC: 31.8 g/dL (ref 30.0–36.0)
MCV: 96.5 fL (ref 80.0–100.0)
Platelets: 317 10*3/uL (ref 150–400)
RBC: 4.83 MIL/uL (ref 4.22–5.81)
RDW: 14.2 % (ref 11.5–15.5)
WBC: 19.6 10*3/uL — ABNORMAL HIGH (ref 4.0–10.5)
nRBC: 0 % (ref 0.0–0.2)

## 2020-02-06 MED ORDER — CLINDAMYCIN PHOSPHATE 600 MG/50ML IV SOLN
600.0000 mg | Freq: Once | INTRAVENOUS | Status: AC
  Administered 2020-02-06: 600 mg via INTRAVENOUS
  Filled 2020-02-06: qty 50

## 2020-02-06 MED ORDER — DEXAMETHASONE 10 MG/ML FOR PEDIATRIC ORAL USE
10.0000 mg | Freq: Once | INTRAMUSCULAR | Status: AC
  Administered 2020-02-06: 10 mg via ORAL

## 2020-02-06 MED ORDER — SODIUM ZIRCONIUM CYCLOSILICATE 10 G PO PACK
10.0000 g | PACK | Freq: Every day | ORAL | Status: DC
  Administered 2020-02-06: 10 g via ORAL
  Filled 2020-02-06: qty 1

## 2020-02-06 MED ORDER — DEXAMETHASONE SODIUM PHOSPHATE 10 MG/ML IJ SOLN
10.0000 mg | Freq: Once | INTRAMUSCULAR | Status: AC
  Administered 2020-02-06: 10 mg via INTRAVENOUS
  Filled 2020-02-06: qty 1

## 2020-02-06 MED ORDER — MORPHINE SULFATE (PF) 4 MG/ML IV SOLN
4.0000 mg | Freq: Once | INTRAVENOUS | Status: AC
  Administered 2020-02-06: 4 mg via INTRAVENOUS
  Filled 2020-02-06: qty 1

## 2020-02-06 MED ORDER — OXYCODONE HCL 5 MG PO TABS
10.0000 mg | ORAL_TABLET | Freq: Once | ORAL | Status: DC
  Filled 2020-02-06: qty 2

## 2020-02-06 MED ORDER — SODIUM CHLORIDE 0.9 % IV BOLUS
1000.0000 mL | Freq: Once | INTRAVENOUS | Status: AC
  Administered 2020-02-06: 1000 mL via INTRAVENOUS

## 2020-02-06 MED ORDER — CLINDAMYCIN HCL 150 MG PO CAPS: 300.0000 mg | ORAL_CAPSULE | Freq: Three times a day (TID) | ORAL | 0 refills | Status: AC

## 2020-02-06 NOTE — Progress Notes (Signed)
Reason for Consult: Right peritonsillar abscess Referring Physician: ER MD Billy Fischer)  [name] is an 59 y.o. male.  HPI: 4-day history of worsening right-sided throat pain radiating to the right ear associated with general malaise.  No fevers or mental status changes.  Patient has a history of a previous right peritonsillar abscess drained successfully.  He has thus far received no treatment.  Past Medical History:  Diagnosis Date  . Allergy   . Anxiety   . Arthritis    Back  . Chronic back pain   . Colon polyps   . COPD (chronic obstructive pulmonary disease) ([place])   . Depression   . GERD (gastroesophageal reflux disease)    every now and then  . Hyperlipidemia 09/26/2015  . Lumbar surgical wound fluid collection    lumbar wound dehiscence  . Pneumonia 2020    Past Surgical History:  Procedure Laterality Date  . BACK SURGERY    . COLONOSCOPY    . COLONOSCOPY W/ BIOPSIES AND POLYPECTOMY    . EYE SURGERY    . KNEE ARTHROSCOPY WITH MEDIAL MENISECTOMY Left 01/23/2016   Procedure: KNEE ARTHROSCOPY WITH MEDIAL MENISECTOMY;  Surgeon: Melrose Nakayama, MD;  Location: [facility];  Service: Orthopedics;  Laterality: Left;  . LUMBAR WOUND DEBRIDEMENT N/A 03/21/2014   Procedure: LUMBAR WOUND DEBRIDEMENT;  Surgeon: Eustace Moore, MD;  Location: [facility];  Service: Neurosurgery;  Laterality: N/A;  . MAXIMUM ACCESS (MAS)POSTERIOR LUMBAR INTERBODY FUSION (PLIF) 1 LEVEL N/A 02/14/2014   Procedure: FOR MAXIMUM ACCESS SURGERY POSTERIOR LUMBAR INTERBODY FUSION LUMBAR TWO-THREE;  Surgeon: Eustace Moore, MD;  Location: [place] [facility];  Service: Neurosurgery;  Laterality: N/A;  . NECK SURGERY    . SHOULDER SURGERY    . SPINE SURGERY    . TONSILLECTOMY      Family History  Problem Relation Age of Onset  . Colon cancer Father 90       pt thinks father had colon cancer  . Stomach cancer Father   . Hypertension Mother   . Colon polyps Brother   . Aneurysm Maternal  Aunt 58       brain  . Heart disease Maternal Aunt        d 64  . Stomach cancer Paternal Uncle 61  . Aneurysm Maternal Grandfather        stomach  . Colon cancer Maternal Grandfather        thinks grandfather had colon cancer  . Stomach cancer Paternal Grandfather 93  . Depression Brother 1  . Suicidality Brother   . Stomach cancer Paternal Uncle 65  . Esophageal cancer Neg Hx   . Rectal cancer Neg Hx   . Diabetes Neg Hx     Social History:  reports that he has been smoking cigarettes. He has a 22.00 pack-year smoking history. His smokeless tobacco use includes snuff. He reports current alcohol use of about 24.0 standard drinks of alcohol per week. He reports that he does not use drugs.  Allergies:  Allergies  Allergen Reactions  . Cefuroxime Axetil Hives    Medications: I have reviewed the patient's current medications.  Results for orders placed or performed during the hospital encounter of 02/06/20 (from the past 48 hour(s))  CBC     Status: Abnormal   Collection Time: 02/06/20 12:08 PM  Result Value Ref Range   WBC 19.6 (H) 4.0 - 10.5 K/uL   RBC 4.83 4.22 - 5.81 MIL/uL   Hemoglobin 14.8 13.0 -  17.0 g/dL   HCT 46.6 39 - 52 %   MCV 96.5 80.0 - 100.0 fL   MCH 30.6 26.0 - 34.0 pg   MCHC 31.8 30.0 - 36.0 g/dL   RDW 14.2 11.5 - 15.5 %   Platelets 317 150 - 400 K/uL   nRBC 0.0 0.0 - 0.2 %    Comment: Performed at [facility], [place] [address], [place], [place] [address]  Basic metabolic panel     Status: Abnormal   Collection Time: 02/06/20 12:08 PM  Result Value Ref Range   Sodium 138 135 - 145 mmol/L   Potassium 5.2 (H) 3.5 - 5.1 mmol/L   Chloride 101 98 - 111 mmol/L   CO2 25 22 - 32 mmol/L   Glucose, Bld 123 (H) 70 - 99 mg/dL    Comment: Glucose reference range applies only to samples taken after fasting for at least 8 hours.   BUN 12 6 - 20 mg/dL   Creatinine, Ser 0.89 0.61 - 1.24 mg/dL   Calcium 9.8 8.9 - 10.3 mg/dL   GFR calc non Af Amer >60 >60 mL/min    Anion gap 12 5 - 15    Comment: Performed at [facility] [address], [place],  [address]    No results found.  Review of Systems   Negative for 9 systems excluding HPI complaints  Blood pressure (!) 144/92, pulse (!) 104, temperature 99.6 F (37.6 C), temperature source Oral, resp. rate 18, height 5\' 11"  (1.803 m), weight 120.2 kg, SpO2 97 %. Physical Exam  Alert and oriented x 3 in no acute distress Pupils equal round and reactive to light/extraocular movements intact/gaze conjugate Face atraumatic without bony step-offs or sinus tenderness Skin without rashes or lesions Chest symmetric expansions bilaterally without use of accessory muscles Mastoids nontender/pinna normal Neck with diffuse cervical adenopathy/no fluctuance/salivary glands unremarkable/trachea midline Cranial nerves II through XII intact without focal motor or sensory deficits Oropharynx with right peritonsillar swelling and fluctuance/uvula midline/base of tongue and floor of mouth uninvolved/dentition normal   Assessment/Plan: Right peritonsillar abscess Acute tonsillitis  This patient has a right peritonsillar abscess.  I discussed the medical versus surgical options and we opted for the latter.  Informed consent was obtained with the patient and his wife who was in the room.  Anesthesia was established using 1% lidocaine with 1 100,000 epinephrine.  Incision was made overlying the right peritonsillar space.  Exploration using curved hemostat revealed a large abscess which was widely drained.  Bleeding was minimal and the procedure tolerated well.  Recommendations:  1.  Clindamycin 300 mg p.o. 4 times daily x 7 days  2.  Gargle warm salt water 3 times daily x3 days  3.  Soft mechanical diet-advance as tolerated  4.  Steroids and IV antibiotics given here.  Recommend Medrol Dosepak x 6 days.  Marcina Millard 02/06/2020, 2:59 PM    Patient ID: [name], male   DOB:  06/08/1960, 59 y.o.   MRN: [id number]

## 2020-02-06 NOTE — ED Provider Notes (Signed)
[facility] EMERGENCY DEPARTMENT Provider Note   CSN: [id number] Arrival date & time: 02/06/20  1116     History Chief Complaint  Patient presents with  . Abscess  . Sore Throat    [name] is a 59 y.o. male.  HPI     59yo male presents from urgent care with concern for right sided sore throat, began as a tingle Sunday and worsened Pain now severe Voice sounds different No drooling Had mild fever   Past Medical History:  Diagnosis Date  . Allergy   . Anxiety   . Arthritis    Back  . Chronic back pain   . Colon polyps   . COPD (chronic obstructive pulmonary disease) ([place])   . Depression   . GERD (gastroesophageal reflux disease)    every now and then  . Hyperlipidemia 09/26/2015  . Lumbar surgical wound fluid collection    lumbar wound dehiscence  . Pneumonia 2020    Patient Active Problem List   Diagnosis Date Noted  . Throat pain in adult   . Peritonsillar abscess   . S/P lumbar fusion 09/10/2019  . Bilateral chronic knee pain 04/03/2019  . Fibromatosis of plantar fascia 11/29/2018  . Pronation deformity of both feet 11/01/2018  . 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  . Plantar fasciitis 09/27/2018  . Left hip pain 09/27/2018  . Microhematuria 09/27/2018  . Chronic cough 09/27/2018  . Abnormal breath sounds 09/27/2018  . Alcohol abuse 09/27/2018  . Left otitis media 05/23/2018  . Upper respiratory tract infection 04/21/2017  . Low testosterone 12/10/2015  . Polymyalgia ([place]) 11/11/2015  . Chronic low back pain 10/30/2015  . Easy bruising 10/30/2015  . Bursitis of right shoulder 09/30/2015  . Hyperglycemia 09/26/2015  . Hyperlipidemia 09/26/2015  . History of colonic polyps 09/26/2015  . Acute medial meniscal tear 09/09/2015  . Multiple bruises 08/28/2015  . Left knee pain 08/28/2015  . S/P lumbar spinal fusion 02/14/2014  . Obesity (BMI 30-39.9) 07/12/2013  . OA (osteoarthritis) 03/07/2012  . Fatigue 03/07/2012  .  Encounter for well adult exam with abnormal findings 09/03/2010  . Smoker 09/03/2010    Past Surgical History:  Procedure Laterality Date  . BACK SURGERY    . COLONOSCOPY    . COLONOSCOPY W/ BIOPSIES AND POLYPECTOMY    . EYE SURGERY    . KNEE ARTHROSCOPY WITH MEDIAL MENISECTOMY Left 01/23/2016   Procedure: KNEE ARTHROSCOPY WITH MEDIAL MENISECTOMY;  Surgeon: Melrose Nakayama, MD;  Location: [facility];  Service: Orthopedics;  Laterality: Left;  . LUMBAR WOUND DEBRIDEMENT N/A 03/21/2014   Procedure: LUMBAR WOUND DEBRIDEMENT;  Surgeon: Eustace Moore, MD;  Location: [facility];  Service: Neurosurgery;  Laterality: N/A;  . MAXIMUM ACCESS (MAS)POSTERIOR LUMBAR INTERBODY FUSION (PLIF) 1 LEVEL N/A 02/14/2014   Procedure: FOR MAXIMUM ACCESS SURGERY POSTERIOR LUMBAR INTERBODY FUSION LUMBAR TWO-THREE;  Surgeon: Eustace Moore, MD;  Location: [facility];  Service: Neurosurgery;  Laterality: N/A;  . NECK SURGERY    . SHOULDER SURGERY    . SPINE SURGERY    . TONSILLECTOMY         Family History  Problem Relation Age of Onset  . Colon cancer Father 46       pt thinks father had colon cancer  . Stomach cancer Father   . Hypertension Mother   . Colon polyps Brother   . Aneurysm Maternal Aunt 58       brain  . Heart disease Maternal  Aunt        d 70  . Stomach cancer Paternal Uncle 67  . Aneurysm Maternal Grandfather        stomach  . Colon cancer Maternal Grandfather        thinks grandfather had colon cancer  . Stomach cancer Paternal Grandfather 62  . Depression Brother 56  . Suicidality Brother   . Stomach cancer Paternal Uncle 12  . Esophageal cancer Neg Hx   . Rectal cancer Neg Hx   . Diabetes Neg Hx     Social History   Tobacco Use  . Smoking status: Current Every Day Smoker    Packs/day: 0.50    Years: 44.00    Pack years: 22.00    Types: Cigarettes  . Smokeless tobacco: Current User    Types: Snuff  Vaping Use  . Vaping Use: Never used  Substance Use  Topics  . Alcohol use: Yes    Alcohol/week: 24.0 standard drinks    Types: 24 Standard drinks or equivalent per week    Comment: social beer  . Drug use: No    Home Medications Prior to Admission medications   Medication Sig Start Date End Date Taking? Authorizing Provider  albuterol (VENTOLIN HFA) 108 (90 Base) MCG/ACT inhaler Inhale 1-2 puffs into the lungs every 6 (six) hours as needed for wheezing or shortness of breath. 11/16/18   [name], MD  atorvastatin (LIPITOR) 10 MG tablet TAKE 1 TABLET BY MOUTH EVERY DAY 12/25/19   [name], MD  clindamycin (CLEOCIN) 150 MG capsule Take 2 capsules (300 mg total) by mouth 3 (three) times daily for 7 days. 02/06/20 02/13/20  Gareth Morgan, MD  DULoxetine (CYMBALTA) 60 MG capsule Take 60 mg by mouth daily.  11/05/14   [provider]  fluticasone (FLONASE) 50 MCG/ACT nasal spray Place 1-2 sprays into both nostrils daily for 10 days. Patient taking differently: Place 1-2 sprays into both nostrils daily as needed for allergies.  11/16/18 08/31/19  [name], MD  gabapentin (NEURONTIN) 300 MG capsule Take 1 capsule (300 mg total) by mouth 3 (three) times daily. Patient taking differently: Take 300 mg by mouth 4 (four) times daily.  04/21/17   Rosemarie Ax, MD  meloxicam (MOBIC) 15 MG tablet Take 1 tablet (15 mg total) by mouth daily. Patient taking differently: Take 15 mg by mouth at bedtime.  09/27/18   [name], MD  Multiple Vitamin (MULTIVITAMIN WITH MINERALS) TABS tablet Take 1 tablet by mouth daily.    [provider]  Oxycodone HCl 10 MG TABS Take 1-2 tablets (10-20 mg total) by mouth every 4 (four) hours as needed (pain). 09/11/19   Eustace Moore, MD  tiZANidine (ZANAFLEX) 4 MG tablet Take 4 mg by mouth 3 (three) times daily.  11/05/14   [provider]  triamcinolone (NASACORT) 55 MCG/ACT AERO nasal inhaler Place 2 sprays into the nose daily. 05/23/18   [name], MD    Allergies    Cefuroxime  axetil  Review of Systems   Review of Systems  Constitutional: Positive for fever (small).  HENT: Positive for sore throat.   Eyes: Negative for visual disturbance.  Respiratory: Negative for shortness of breath.   Cardiovascular: Negative for chest pain.  Gastrointestinal: Negative for abdominal pain, nausea and vomiting.  Genitourinary: Negative for difficulty urinating.  Musculoskeletal: Positive for arthralgias (chronic) and back pain. Negative for neck stiffness.  Skin: Negative for rash.  Neurological: Negative for  syncope and headaches.    Physical Exam Updated Vital Signs BP (!) 148/88   Pulse (!) 102   Temp 99.6 F (37.6 C) (Oral)   Resp 18   Ht 5\' 11"  (1.803 m)   Wt 120.2 kg   SpO2 96%   BMI 36.96 kg/m   Physical Exam Vitals and nursing note reviewed.  Constitutional:      General: He is not in acute distress.    Appearance: He is well-developed. He is not diaphoretic.  HENT:     Head: Normocephalic and atraumatic.     Mouth/Throat:     Comments: Right peritonsillar swelling, uvular deviation Eyes:     Conjunctiva/sclera: Conjunctivae normal.  Cardiovascular:     Rate and Rhythm: Normal rate and regular rhythm.  Pulmonary:     Effort: Pulmonary effort is normal. No respiratory distress.  Musculoskeletal:     Cervical back: Normal range of motion.  Skin:    General: Skin is warm and dry.  Neurological:     Mental Status: He is alert and oriented to person, place, and time.     ED Results / Procedures / Treatments   Labs (all labs ordered are listed, but only abnormal results are displayed) Labs Reviewed  CBC - Abnormal; Notable for the following components:      Result Value   WBC 19.6 (*)    All other components within normal limits  BASIC METABOLIC PANEL - Abnormal; Notable for the following components:   Potassium 5.2 (*)    Glucose, Bld 123 (*)    All other components within normal limits  RESPIRATORY PANEL BY RT PCR (FLU A&B, COVID)     EKG None  Radiology No results found.  Procedures Procedures (including critical care time)  Medications Ordered in ED Medications  sodium chloride 0.9 % bolus 1,000 mL (0 mLs Intravenous Stopped 02/06/20 1600)  dexamethasone (DECADRON) injection 10 mg (10 mg Intravenous Given 02/06/20 1502)  clindamycin (CLEOCIN) IVPB 600 mg (0 mg Intravenous Stopped 02/06/20 1551)  morphine 4 MG/ML injection 4 mg (4 mg Intravenous Given 02/06/20 1428)    ED Course  I have reviewed the triage vital signs and the nursing notes.  Pertinent labs & imaging results that were available during my care of the patient were reviewed by me and considered in my medical decision making (see chart for details).    MDM Rules/Calculators/A&P                          59yo male with history of peritonsillar abscess presents with concern for sore throat and right peritonsillar swelling from urgent care.  Exam consistent with peritonsillar abscess. No sign of other deep neck space infection. COVID negative. Consulted ENT, Dr. Marcelline Deist, who came to bedside for incision and drainage.  Given clindamycin and decadron ine ED and rx for clindamycin at discharge. Recommend outpt ENT follow up.    Final Clinical Impression(s) / ED Diagnoses Final diagnoses:  Peritonsillar abscess    Rx / DC Orders ED Discharge Orders         Ordered    clindamycin (CLEOCIN) 150 MG capsule  3 times daily        02/06/20 1544           Gareth Morgan, MD 02/07/20 1501

## 2020-02-06 NOTE — ED Triage Notes (Signed)
Patient arrived by self from home. Patient c/o Sore Throat .  Patient stated "I have difficulty swallowing".   Patient has history of peritonsillar abscess.

## 2020-02-06 NOTE — ED Triage Notes (Signed)
Pt arrives to ED from UC with complaints of difficulty swallowing and a sore throat. Pt was sent here for probable peritonsillar abscess.

## 2020-02-06 NOTE — ED Provider Notes (Signed)
Emergency Department Provider Note  ____________________________________________  Time seen: Approximately 10:36 AM  I have reviewed the triage vital signs and the nursing notes.   HISTORY  Chief Complaint Sore Throat   Historian Patient     HPI [name] is a 59 y.o. male with a history of peritonsillar abscesses, presents to the emergency department with pharyngitis, voice changes, low-grade fever and difficulty swallowing. Patient states that he has had symptoms for the past 2 days. He denies chest pain, chest tightness or abdominal pain. He is able to speak to his own secretions in exam room. No alleviating measures have been attempted.   Past Medical History:  Diagnosis Date  . Allergy   . Anxiety   . Arthritis    Back  . Chronic back pain   . Colon polyps   . COPD (chronic obstructive pulmonary disease) ([place])   . Depression   . GERD (gastroesophageal reflux disease)    every now and then  . Hyperlipidemia 09/26/2015  . Lumbar surgical wound fluid collection    lumbar wound dehiscence  . Pneumonia 2020     Immunizations up to date:  Yes.     Past Medical History:  Diagnosis Date  . Allergy   . Anxiety   . Arthritis    Back  . Chronic back pain   . Colon polyps   . COPD (chronic obstructive pulmonary disease) ([place])   . Depression   . GERD (gastroesophageal reflux disease)    every now and then  . Hyperlipidemia 09/26/2015  . Lumbar surgical wound fluid collection    lumbar wound dehiscence  . Pneumonia 2020    Patient Active Problem List   Diagnosis Date Noted  . S/P lumbar fusion 09/10/2019  . Bilateral chronic knee pain 04/03/2019  . Fibromatosis of plantar fascia 11/29/2018  . Pronation deformity of both feet 11/01/2018  . 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  . Plantar fasciitis 09/27/2018  . Left hip pain 09/27/2018  . Microhematuria 09/27/2018  . Chronic cough 09/27/2018  . Abnormal breath sounds 09/27/2018  . Alcohol abuse  09/27/2018  . Left otitis media 05/23/2018  . Upper respiratory tract infection 04/21/2017  . Low testosterone 12/10/2015  . Polymyalgia ([place]) 11/11/2015  . Chronic low back pain 10/30/2015  . Easy bruising 10/30/2015  . Bursitis of right shoulder 09/30/2015  . Hyperglycemia 09/26/2015  . Hyperlipidemia 09/26/2015  . History of colonic polyps 09/26/2015  . Acute medial meniscal tear 09/09/2015  . Multiple bruises 08/28/2015  . Left knee pain 08/28/2015  . S/P lumbar spinal fusion 02/14/2014  . Obesity (BMI 30-39.9) 07/12/2013  . OA (osteoarthritis) 03/07/2012  . Fatigue 03/07/2012  . Encounter for well adult exam with abnormal findings 09/03/2010  . Smoker 09/03/2010    Past Surgical History:  Procedure Laterality Date  . BACK SURGERY    . COLONOSCOPY    . COLONOSCOPY W/ BIOPSIES AND POLYPECTOMY    . EYE SURGERY    . KNEE ARTHROSCOPY WITH MEDIAL MENISECTOMY Left 01/23/2016   Procedure: KNEE ARTHROSCOPY WITH MEDIAL MENISECTOMY;  Surgeon: Melrose Nakayama, MD;  Location: [facility];  Service: Orthopedics;  Laterality: Left;  . LUMBAR WOUND DEBRIDEMENT N/A 03/21/2014   Procedure: LUMBAR WOUND DEBRIDEMENT;  Surgeon: Eustace Moore, MD;  Location: [facility];  Service: Neurosurgery;  Laterality: N/A;  . MAXIMUM ACCESS (MAS)POSTERIOR LUMBAR INTERBODY FUSION (PLIF) 1 LEVEL N/A 02/14/2014   Procedure: FOR MAXIMUM ACCESS SURGERY POSTERIOR LUMBAR INTERBODY FUSION LUMBAR TWO-THREE;  Surgeon: Shanon Brow  Adah Salvage, MD;  Location: [facility];  Service: Neurosurgery;  Laterality: N/A;  . NECK SURGERY    . SHOULDER SURGERY    . SPINE SURGERY    . TONSILLECTOMY      Prior to Admission medications   Medication Sig Start Date End Date Taking? Authorizing Provider  atorvastatin (LIPITOR) 10 MG tablet TAKE 1 TABLET BY MOUTH EVERY DAY 12/25/19  Yes [name], MD  DULoxetine (CYMBALTA) 60 MG capsule Take 60 mg by mouth daily.  11/05/14  Yes [provider]  gabapentin  (NEURONTIN) 300 MG capsule Take 1 capsule (300 mg total) by mouth 3 (three) times daily. Patient taking differently: Take 300 mg by mouth 4 (four) times daily.  04/21/17  Yes Rosemarie Ax, MD  meloxicam (MOBIC) 15 MG tablet Take 1 tablet (15 mg total) by mouth daily. Patient taking differently: Take 15 mg by mouth at bedtime.  09/27/18  Yes [name], MD  Multiple Vitamin (MULTIVITAMIN WITH MINERALS) TABS tablet Take 1 tablet by mouth daily.   Yes [provider]  Oxycodone HCl 10 MG TABS Take 1-2 tablets (10-20 mg total) by mouth every 4 (four) hours as needed (pain). 09/11/19  Yes Eustace Moore, MD  tiZANidine (ZANAFLEX) 4 MG tablet Take 4 mg by mouth 3 (three) times daily.  11/05/14  Yes [provider]  triamcinolone (NASACORT) 55 MCG/ACT AERO nasal inhaler Place 2 sprays into the nose daily. 05/23/18  Yes [name], MD  albuterol (VENTOLIN HFA) 108 (90 Base) MCG/ACT inhaler Inhale 1-2 puffs into the lungs every 6 (six) hours as needed for wheezing or shortness of breath. 11/16/18   [name], MD  fluticasone (FLONASE) 50 MCG/ACT nasal spray Place 1-2 sprays into both nostrils daily for 10 days. Patient taking differently: Place 1-2 sprays into both nostrils daily as needed for allergies.  11/16/18 08/31/19  [name], MD    Allergies Cefuroxime axetil  Family History  Problem Relation Age of Onset  . Colon cancer Father 35       pt thinks father had colon cancer  . Stomach cancer Father   . Hypertension Mother   . Colon polyps Brother   . Aneurysm Maternal Aunt 58       brain  . Heart disease Maternal Aunt        d 28  . Stomach cancer Paternal Uncle 56  . Aneurysm Maternal Grandfather        stomach  . Colon cancer Maternal Grandfather        thinks grandfather had colon cancer  . Stomach cancer Paternal Grandfather 36  . Depression Brother 29  . Suicidality Brother   . Stomach cancer Paternal Uncle 2  . Esophageal cancer Neg Hx   . Rectal  cancer Neg Hx   . Diabetes Neg Hx     Social History Social History   Tobacco Use  . Smoking status: Current Every Day Smoker    Packs/day: 0.50    Years: 44.00    Pack years: 22.00    Types: Cigarettes  . Smokeless tobacco: Current User    Types: Snuff  Vaping Use  . Vaping Use: Never used  Substance Use Topics  . Alcohol use: Yes    Alcohol/week: 24.0 standard drinks    Types: 24 Standard drinks or equivalent per week    Comment: social beer  . Drug use: No     Review of Systems  Constitutional: No  fever/chills Eyes:  No discharge ENT: Patient has pharyngitis.  Respiratory: no cough. No SOB/ use of accessory muscles to breath Gastrointestinal:   No nausea, no vomiting.  No diarrhea.  No constipation. Musculoskeletal: Negative for musculoskeletal pain. Skin: Negative for rash, abrasions, lacerations, ecchymosis.    ____________________________________________   PHYSICAL EXAM:  VITAL SIGNS: ED Triage Vitals  Enc Vitals Group     BP 02/06/20 1009 (!) 154/87     Pulse Rate 02/06/20 1009 (!) 117     Resp 02/06/20 1009 (!) 22     Temp 02/06/20 1009 99.1 F (37.3 C)     Temp Source 02/06/20 1009 Oral     SpO2 02/06/20 1009 94 %     Weight 02/06/20 1004 260 lb (117.9 kg)     Height 02/06/20 1004 5\' 11"  (1.803 m)     Head Circumference --      Peak Flow --      Pain Score 02/06/20 1004 5     Pain Loc --      Pain Edu? --      Excl. in [place]? --      Constitutional: Alert and oriented. Well appearing and in no acute distress. Patient has hot potato voice. Eyes: Conjunctivae are normal. PERRL. EOMI. Head: Atraumatic. ENT:      Nose: No congestion/rhinnorhea.      Mouth/Throat: Mucous membranes are moist. Posterior pharynx is erythematous with edema. Neck: No stridor. No cervical spine tenderness to palpation. Hematological/Lymphatic/Immunilogical: [place] cervical lymphadenopathy. Cardiovascular: Normal rate, regular rhythm. Normal S1 and S2.  Good  peripheral circulation. Respiratory: Normal respiratory effort without tachypnea or retractions. Lungs CTAB. Good air entry to the bases with no decreased or absent breath sounds Gastrointestinal: Bowel sounds x 4 quadrants. Soft and nontender to palpation. No guarding or rigidity. No distention. Musculoskeletal: Full range of motion to all extremities. No obvious deformities noted Neurologic:  Normal for age. No gross focal neurologic deficits are appreciated.  Skin:  Skin is warm, dry and intact. No rash noted. Psychiatric: Mood and affect are normal for age. Speech and behavior are normal.   ____________________________________________   LABS (all labs ordered are listed, but only abnormal results are displayed)  Labs Reviewed - No data to display ____________________________________________  EKG   ____________________________________________  RADIOLOGY   No results found.  ____________________________________________    PROCEDURES  Procedure(s) performed:     Procedures     Medications  dexamethasone (DECADRON) 10 MG/ML injection for Pediatric ORAL use 10 mg (10 mg Oral Given 02/06/20 1027)     ____________________________________________   INITIAL IMPRESSION / ASSESSMENT AND PLAN / ED COURSE  Pertinent labs & imaging results that were available during my care of the patient were reviewed by me and considered in my medical decision making (see chart for details).      Assessment and plan Pharyngitis 59 year old male presents to the emergency department with difficulty swallowing, pharyngitis and voice changes.  Patient was tachycardic, tachypneic and satting at 94% on room air. Posterior pharynx was erythematous and edematous. Patient was able to manage own secretions in exam room.  Patient was referred to the emergency department for further care and management given high risk for peritonsillar abscess. Patient stated that he would go straight to the  emergency department and felt well enough to drive himself. Patient was given oral Decadron prior to being discharged from the urgent care.     ____________________________________________  FINAL CLINICAL IMPRESSION(S) / ED DIAGNOSES  Final diagnoses:  Acute pharyngitis, unspecified etiology      NEW MEDICATIONS STARTED DURING THIS VISIT:  ED Discharge Orders    None          This chart was dictated using voice recognition software/Dragon. Despite best efforts to proofread, errors can occur which can change the meaning. Any change was purely unintentional.     Lannie Fields, PA-C 02/06/20 1045

## 2020-02-06 NOTE — Discharge Instructions (Addendum)
Go straight to the emergency department at [facility].

## 2020-02-12 DIAGNOSIS — M5442 Lumbago with sciatica, left side: Secondary | ICD-10-CM | POA: Diagnosis not present

## 2020-02-12 DIAGNOSIS — G8929 Other chronic pain: Secondary | ICD-10-CM | POA: Diagnosis not present

## 2020-02-12 DIAGNOSIS — M7062 Trochanteric bursitis, left hip: Secondary | ICD-10-CM | POA: Diagnosis not present

## 2020-02-21 ENCOUNTER — Encounter: Payer: Self-pay | Admitting: Internal Medicine

## 2020-02-21 ENCOUNTER — Ambulatory Visit (INDEPENDENT_AMBULATORY_CARE_PROVIDER_SITE_OTHER): Payer: Medicare Other | Admitting: Internal Medicine

## 2020-02-21 ENCOUNTER — Other Ambulatory Visit: Payer: Self-pay

## 2020-02-21 VITALS — BP 140/90 | HR 83 | Temp 98.3°F | Ht 71.0 in | Wt 266.0 lb

## 2020-02-21 DIAGNOSIS — R739 Hyperglycemia, unspecified: Secondary | ICD-10-CM

## 2020-02-21 DIAGNOSIS — G8929 Other chronic pain: Secondary | ICD-10-CM

## 2020-02-21 DIAGNOSIS — M25562 Pain in left knee: Secondary | ICD-10-CM

## 2020-02-21 DIAGNOSIS — Z0001 Encounter for general adult medical examination with abnormal findings: Secondary | ICD-10-CM | POA: Diagnosis not present

## 2020-02-21 DIAGNOSIS — E559 Vitamin D deficiency, unspecified: Secondary | ICD-10-CM

## 2020-02-21 DIAGNOSIS — M545 Low back pain, unspecified: Secondary | ICD-10-CM

## 2020-02-21 DIAGNOSIS — R238 Other skin changes: Secondary | ICD-10-CM | POA: Diagnosis not present

## 2020-02-21 DIAGNOSIS — M25561 Pain in right knee: Secondary | ICD-10-CM

## 2020-02-21 DIAGNOSIS — Z0000 Encounter for general adult medical examination without abnormal findings: Secondary | ICD-10-CM

## 2020-02-21 DIAGNOSIS — R21 Rash and other nonspecific skin eruption: Secondary | ICD-10-CM | POA: Insufficient documentation

## 2020-02-21 DIAGNOSIS — Z1211 Encounter for screening for malignant neoplasm of colon: Secondary | ICD-10-CM

## 2020-02-21 DIAGNOSIS — R233 Spontaneous ecchymoses: Secondary | ICD-10-CM

## 2020-02-21 DIAGNOSIS — Z23 Encounter for immunization: Secondary | ICD-10-CM | POA: Diagnosis not present

## 2020-02-21 DIAGNOSIS — E785 Hyperlipidemia, unspecified: Secondary | ICD-10-CM | POA: Diagnosis not present

## 2020-02-21 DIAGNOSIS — E538 Deficiency of other specified B group vitamins: Secondary | ICD-10-CM | POA: Diagnosis not present

## 2020-02-21 DIAGNOSIS — F418 Other specified anxiety disorders: Secondary | ICD-10-CM

## 2020-02-21 LAB — CBC WITH DIFFERENTIAL/PLATELET
Basophils Absolute: 0.1 10*3/uL (ref 0.0–0.1)
Basophils Relative: 0.8 % (ref 0.0–3.0)
Eosinophils Absolute: 0.1 10*3/uL (ref 0.0–0.7)
Eosinophils Relative: 1 % (ref 0.0–5.0)
HCT: 42.1 % (ref 39.0–52.0)
Hemoglobin: 14.1 g/dL (ref 13.0–17.0)
Lymphocytes Relative: 17.9 % (ref 12.0–46.0)
Lymphs Abs: 2.4 10*3/uL (ref 0.7–4.0)
MCHC: 33.5 g/dL (ref 30.0–36.0)
MCV: 93.8 fl (ref 78.0–100.0)
Monocytes Absolute: 1.1 10*3/uL — ABNORMAL HIGH (ref 0.1–1.0)
Monocytes Relative: 8.2 % (ref 3.0–12.0)
Neutro Abs: 9.5 10*3/uL — ABNORMAL HIGH (ref 1.4–7.7)
Neutrophils Relative %: 72.1 % (ref 43.0–77.0)
Platelets: 394 10*3/uL (ref 150.0–400.0)
RBC: 4.49 Mil/uL (ref 4.22–5.81)
RDW: 14.7 % (ref 11.5–15.5)
WBC: 13.2 10*3/uL — ABNORMAL HIGH (ref 4.0–10.5)

## 2020-02-21 LAB — VITAMIN B12: Vitamin B-12: 693 pg/mL (ref 211–911)

## 2020-02-21 LAB — URINALYSIS, ROUTINE W REFLEX MICROSCOPIC
Bilirubin Urine: NEGATIVE
Hgb urine dipstick: NEGATIVE
Leukocytes,Ua: NEGATIVE
Nitrite: NEGATIVE
Specific Gravity, Urine: 1.02 (ref 1.000–1.030)
Urine Glucose: NEGATIVE
Urobilinogen, UA: 1 (ref 0.0–1.0)
pH: 6.5 (ref 5.0–8.0)

## 2020-02-21 LAB — HEMOGLOBIN A1C: Hgb A1c MFr Bld: 6.4 % (ref 4.6–6.5)

## 2020-02-21 LAB — LIPID PANEL
Cholesterol: 156 mg/dL (ref 0–200)
HDL: 50.2 mg/dL (ref >39.00)
LDL Cholesterol: 81 mg/dL (ref 0–99)
NonHDL: 105.46
Total CHOL/HDL Ratio: 3
Triglycerides: 124 mg/dL (ref 0.0–149.0)
VLDL: 24.8 mg/dL (ref 0.0–40.0)

## 2020-02-21 LAB — PROTIME-INR
INR: 0.9 ratio (ref 0.8–1.0)
Prothrombin Time: 10.5 s (ref 9.6–13.1)

## 2020-02-21 LAB — HEPATIC FUNCTION PANEL
ALT: 31 U/L (ref 0–53)
AST: 19 U/L (ref 0–37)
Albumin: 4.4 g/dL (ref 3.5–5.2)
Alkaline Phosphatase: 84 U/L (ref 39–117)
Bilirubin, Direct: 0.1 mg/dL (ref 0.0–0.3)
Total Bilirubin: 0.6 mg/dL (ref 0.2–1.2)
Total Protein: 6.7 g/dL (ref 6.0–8.3)

## 2020-02-21 LAB — TSH: TSH: 2.31 u[IU]/mL (ref 0.35–4.50)

## 2020-02-21 LAB — VITAMIN D 25 HYDROXY (VIT D DEFICIENCY, FRACTURES): VITD: 40.44 ng/mL (ref 30.00–100.00)

## 2020-02-21 LAB — PSA: PSA: 0.77 ng/mL (ref 0.10–4.00)

## 2020-02-21 NOTE — Assessment & Plan Note (Signed)
?   Underlying cirrhosis - for INR today

## 2020-02-21 NOTE — Assessment & Plan Note (Signed)
May eventaully need further lumbar surgury,  to f/u any worsening symptoms or concerns

## 2020-02-21 NOTE — Assessment & Plan Note (Signed)
Possible severe dry skin - for derm referral

## 2020-02-21 NOTE — Progress Notes (Addendum)
Subjective:    Patient ID: [name], male    DOB: 03/21/1961, 59 y.o.   MRN: [id number]  HPI  Here for wellness and f/u;  Overall doing ok;  Pt denies Chest pain, worsening SOB, DOE, wheezing, orthopnea, PND, worsening LE edema, palpitations, dizziness or syncope.  Pt denies neurological change such as new headache, facial or extremity weakness.  Pt denies polydipsia, polyuria, or low sugar symptoms. Pt states overall good compliance with treatment and medications, good tolerability, and has been trying to follow appropriate diet.  Pt denies worsening depressive symptoms, suicidal ideation or panic. No fever, night sweats, wt loss, loss of appetite, or other constitutional symptoms.  Pt states good ability with ADL's, has low fall risk, home safety reviewed and adequate, no other significant changes in hearing or vision, and only occasionally active with exercise. No other complaints except easy bruising to the arms. Past Medical History:  Diagnosis Date  . Allergy   . Anxiety   . Arthritis    Back  . Chronic back pain   . Colon polyps   . COPD (chronic obstructive pulmonary disease) ([place])   . Depression   . GERD (gastroesophageal reflux disease)    every now and then  . Hyperlipidemia 09/26/2015  . Lumbar surgical wound fluid collection    lumbar wound dehiscence  . Pneumonia 2020   Past Surgical History:  Procedure Laterality Date  . BACK SURGERY    . COLONOSCOPY    . COLONOSCOPY W/ BIOPSIES AND POLYPECTOMY    . EYE SURGERY    . KNEE ARTHROSCOPY WITH MEDIAL MENISECTOMY Left 01/23/2016   Procedure: KNEE ARTHROSCOPY WITH MEDIAL MENISECTOMY;  Surgeon: Melrose Nakayama, MD;  Location: [facility];  Service: Orthopedics;  Laterality: Left;  . LUMBAR WOUND DEBRIDEMENT N/A 03/21/2014   Procedure: LUMBAR WOUND DEBRIDEMENT;  Surgeon: Eustace Moore, MD;  Location: [facility];  Service: Neurosurgery;  Laterality: N/A;  . MAXIMUM ACCESS (MAS)POSTERIOR LUMBAR INTERBODY  FUSION (PLIF) 1 LEVEL N/A 02/14/2014   Procedure: FOR MAXIMUM ACCESS SURGERY POSTERIOR LUMBAR INTERBODY FUSION LUMBAR TWO-THREE;  Surgeon: Eustace Moore, MD;  Location: [facility];  Service: Neurosurgery;  Laterality: N/A;  . NECK SURGERY    . SHOULDER SURGERY    . SPINE SURGERY    . TONSILLECTOMY      reports that he has been smoking cigarettes. He has a 22.00 pack-year smoking history. His smokeless tobacco use includes snuff. He reports current alcohol use of about 24.0 standard drinks of alcohol per week. He reports that he does not use drugs. family history includes Aneurysm in his maternal grandfather; Aneurysm (age of onset: 59) in his maternal aunt; Colon cancer in his maternal grandfather; Colon cancer (age of onset: 58) in his father; Colon polyps in his brother; Depression (age of onset: 72) in his brother; Heart disease in his maternal aunt; Hypertension in his mother; Stomach cancer in his father; Stomach cancer (age of onset: 50) in his paternal uncle; Stomach cancer (age of onset: 36) in his paternal uncle; Stomach cancer (age of onset: 101) in his paternal grandfather; Suicidality in his brother. Allergies  Allergen Reactions  . Cefuroxime Axetil Hives   Current Outpatient Medications on File Prior to Visit  Medication Sig Dispense Refill  . albuterol (VENTOLIN HFA) 108 (90 Base) MCG/ACT inhaler Inhale 1-2 puffs into the lungs every 6 (six) hours as needed for wheezing or shortness of breath. 18 g 11  . atorvastatin (LIPITOR) 10 MG  tablet TAKE 1 TABLET BY MOUTH EVERY DAY 90 tablet 0  . DULoxetine (CYMBALTA) 60 MG capsule Take 60 mg by mouth daily.   2  . gabapentin (NEURONTIN) 300 MG capsule Take 1 capsule (300 mg total) by mouth 3 (three) times daily. (Patient taking differently: Take 300 mg by mouth 4 (four) times daily. ) 90 capsule 3  . meloxicam (MOBIC) 15 MG tablet Take 1 tablet (15 mg total) by mouth daily. (Patient taking differently: Take 15 mg by mouth at bedtime. ) 90  tablet 3  . Multiple Vitamin (MULTIVITAMIN WITH MINERALS) TABS tablet Take 1 tablet by mouth daily.    . Oxycodone HCl 10 MG TABS Take 1-2 tablets (10-20 mg total) by mouth every 4 (four) hours as needed (pain). 40 tablet 0  . tiZANidine (ZANAFLEX) 4 MG tablet Take 4 mg by mouth 3 (three) times daily.   2  . triamcinolone (NASACORT) 55 MCG/ACT AERO nasal inhaler Place 2 sprays into the nose daily. 1 Inhaler 12  . fluticasone (FLONASE) 50 MCG/ACT nasal spray Place 1-2 sprays into both nostrils daily for 10 days. (Patient taking differently: Place 1-2 sprays into both nostrils daily as needed for allergies. ) 1 g 11   No current facility-administered medications on file prior to visit.   Review of Systems All otherwise neg per pt=    Objective:   Physical Exam BP 140/90 (BP Location: Left Arm, Patient Position: Sitting, Cuff Size: Large)   Pulse 83   Temp 98.3 F (36.8 C) (Oral)   Ht 5\' 11"  (1.803 m)   Wt 266 lb (120.7 kg)   SpO2 96%   BMI 37.10 kg/m  VS noted,  Constitutional: Pt appears in NAD HENT: Head: NCAT.  Right Ear: External ear normal.  Left Ear: External ear normal.  Eyes: . Pupils are equal, round, and reactive to light. Conjunctivae and EOM are normal Nose: without d/c or deformity Neck: Neck supple. Gross normal ROM Cardiovascular: Normal rate and regular rhythm.   Pulmonary/Chest: Effort normal and breath sounds without rales or wheezing.  Abd:  Soft, NT, ND, + BS, no organomegaly Neurological: Pt is alert. At baseline orientation, motor grossly intact Skin: Skin is warm. No rashes, has several senile purpura like new lesions to the arms, no LE edema Psychiatric: Pt behavior is normal without agitation  All otherwise neg per pt Lab Results  Component Value Date   WBC 13.2 (H) 02/21/2020   HGB 14.1 02/21/2020   HCT 42.1 02/21/2020   PLT 394.0 02/21/2020   GLUCOSE 107 (H) 02/21/2020   CHOL 156 02/21/2020   TRIG 124.0 02/21/2020   HDL 50.20 02/21/2020    LDLDIRECT 150.1 03/07/2012   LDLCALC 81 02/21/2020   ALT 31 02/21/2020   AST 19 02/21/2020   NA 141 02/21/2020   K 5.6 (H) 02/21/2020   CL 103 02/21/2020   CREATININE 0.78 02/21/2020   BUN 12 02/21/2020   CO2 29 02/21/2020   TSH 2.31 02/21/2020   PSA 0.77 02/21/2020   INR 0.9 02/21/2020   HGBA1C 6.4 02/21/2020      Assessment & Plan:

## 2020-02-21 NOTE — Assessment & Plan Note (Signed)
Encourage to f/u again with Dr Tamala Julian, likely needs repeat cortisone

## 2020-02-21 NOTE — Assessment & Plan Note (Signed)
On new statin - for f/u lipids, low chol diet

## 2020-02-21 NOTE — Patient Instructions (Addendum)
You had the flu shot today  Please remember to have the moderna booster soon when available  You will be contacted regarding the referral for: colonoscopy, and dermatology  Please continue all other medications as before, and refills have been done if requested.  Please have the pharmacy call with any other refills you may need.  Please continue your efforts at being more active, low cholesterol diet, and weight control.  You are otherwise up to date with prevention measures today.  Please keep your appointments with your specialists as you may have planned  Please go to the LAB at the blood drawing area for the tests to be done  You will be contacted by phone if any changes need to be made immediately.  Otherwise, you will receive a letter about your results with an explanation, but please check with MyChart first.  Please remember to sign up for MyChart if you have not done so, as this will be important to you in the future with finding out test results, communicating by private email, and scheduling acute appointments online when needed.  Please make an Appointment to return for your 1 year visit, or sooner if needed

## 2020-02-22 LAB — BASIC METABOLIC PANEL
BUN: 12 mg/dL (ref 6–23)
CO2: 29 mEq/L (ref 19–32)
Calcium: 10.1 mg/dL (ref 8.4–10.5)
Chloride: 103 mEq/L (ref 96–112)
Creatinine, Ser: 0.78 mg/dL (ref 0.40–1.50)
GFR: 88 mL/min (ref >60.00)
Glucose, Bld: 107 mg/dL — ABNORMAL HIGH (ref 70–99)
Potassium: 5.6 mEq/L — ABNORMAL HIGH (ref 3.5–5.1)
Sodium: 141 mEq/L (ref 135–145)

## 2020-02-23 ENCOUNTER — Encounter: Payer: Self-pay | Admitting: Internal Medicine

## 2020-02-25 NOTE — Progress Notes (Signed)
[facility] [address] [place] Phone: [phone] Subjective:   I [name] am serving as a Education administrator for Dr. Hulan Saas.  This visit occurred during the SARS-CoV-2 public health emergency.  Safety protocols were in place, including screening questions prior to the visit, additional usage of staff PPE, and extensive cleaning of exam room while observing appropriate contact time as indicated for disinfecting solutions.   I'm seeing this patient by the request  of:  [name], MD  CC: Left knee pain  S[facility]:[id number]   10/29/2019 Social determinants of health including patient's recent back surgery.  I do not feel that adding medications will be beneficial at the moment especially with patient being maxed out on the gabapentin, Cymbalta and the meloxicam.  Patient does have chronic problem with exacerbation.  Discussed all the other medicines and given injections.  Could be a candidate for potential viscosupplementation.  Possible need for repeating of imaging.  Follow-up again in 4 to 8 weeks   Update 02/26/2020 TRAYVEN LUMADUE is a 59 y.o. male coming in with complaint of bilateral knee pain. Gelsyn approved on 10/31/2019. Patient states he is not doing well. States he is achy all the time and the left knee is weak. Bilateral foot pain as well since July 1st. Toes are numb and tingly that radiates up the left leg. Recent back surgery in May. Nerve test on Thursday.     Past Medical History:  Diagnosis Date  . Allergy   . Anxiety   . Arthritis    Back  . Chronic back pain   . Colon polyps   . COPD (chronic obstructive pulmonary disease) ([place])   . Depression   . GERD (gastroesophageal reflux disease)    every now and then  . Hyperlipidemia 09/26/2015  . Lumbar surgical wound fluid collection    lumbar wound dehiscence  . Pneumonia 2020   Past Surgical History:  Procedure Laterality Date  . BACK SURGERY    . COLONOSCOPY     . COLONOSCOPY W/ BIOPSIES AND POLYPECTOMY    . EYE SURGERY    . KNEE ARTHROSCOPY WITH MEDIAL MENISECTOMY Left 01/23/2016   Procedure: KNEE ARTHROSCOPY WITH MEDIAL MENISECTOMY;  Surgeon: Melrose Nakayama, MD;  Location: [facility];  Service: Orthopedics;  Laterality: Left;  . LUMBAR WOUND DEBRIDEMENT N/A 03/21/2014   Procedure: LUMBAR WOUND DEBRIDEMENT;  Surgeon: Eustace Moore, MD;  Location: [facility];  Service: Neurosurgery;  Laterality: N/A;  . MAXIMUM ACCESS (MAS)POSTERIOR LUMBAR INTERBODY FUSION (PLIF) 1 LEVEL N/A 02/14/2014   Procedure: FOR MAXIMUM ACCESS SURGERY POSTERIOR LUMBAR INTERBODY FUSION LUMBAR TWO-THREE;  Surgeon: Eustace Moore, MD;  Location: [facility];  Service: Neurosurgery;  Laterality: N/A;  . NECK SURGERY    . SHOULDER SURGERY    . SPINE SURGERY    . TONSILLECTOMY     Social History   Socioeconomic History  . Marital status: Married    Spouse name: cindy  . Number of children: Not on file  . Years of education: Not on file  . Highest education level: Not on file  Occupational History  . Occupation: produce manager/ disability    Employer: [employer]  Tobacco Use  . Smoking status: Current Every Day Smoker    Packs/day: 0.50    Years: 44.00    Pack years: 22.00    Types: Cigarettes  . Smokeless tobacco: Current User    Types: Snuff  Vaping Use  . Vaping Use: Never used  Substance and Sexual Activity  . Alcohol use: Yes    Alcohol/week: 24.0 standard drinks    Types: 24 Standard drinks or equivalent per week    Comment: social beer  . Drug use: No  . Sexual activity: Yes    Partners: Female  Other Topics Concern  . Not on file  Social History Narrative   Exercise--no   Social Determinants of Health   Financial Resource Strain:   . Difficulty of Paying Living Expenses: Not on file  Food Insecurity:   . Worried About Charity fundraiser in the Last Year: Not on file  . Ran Out of Food in the Last Year: Not on file    Transportation Needs:   . Lack of Transportation (Medical): Not on file  . Lack of Transportation (Non-Medical): Not on file  Physical Activity:   . Days of Exercise per Week: Not on file  . Minutes of Exercise per Session: Not on file  Stress:   . Feeling of Stress : Not on file  Social Connections:   . Frequency of Communication with Friends and Family: Not on file  . Frequency of Social Gatherings with Friends and Family: Not on file  . Attends Religious Services: Not on file  . Active Member of Clubs or Organizations: Not on file  . Attends Archivist Meetings: Not on file  . Marital Status: Not on file   Allergies  Allergen Reactions  . Cefuroxime Axetil Hives   Family History  Problem Relation Age of Onset  . Colon cancer Father 2       pt thinks father had colon cancer  . Stomach cancer Father   . Hypertension Mother   . Colon polyps Brother   . Aneurysm Maternal Aunt 58       brain  . Heart disease Maternal Aunt        d 74  . Stomach cancer Paternal Uncle 81  . Aneurysm Maternal Grandfather        stomach  . Colon cancer Maternal Grandfather        thinks grandfather had colon cancer  . Stomach cancer Paternal Grandfather 61  . Depression Brother 59  . Suicidality Brother   . Stomach cancer Paternal Uncle 42  . Esophageal cancer Neg Hx   . Rectal cancer Neg Hx   . Diabetes Neg Hx      Current Outpatient Medications (Cardiovascular):  .  atorvastatin (LIPITOR) 10 MG tablet, TAKE 1 TABLET BY MOUTH EVERY DAY  Current Outpatient Medications (Respiratory):  .  albuterol (VENTOLIN HFA) 108 (90 Base) MCG/ACT inhaler, Inhale 1-2 puffs into the lungs every 6 (six) hours as needed for wheezing or shortness of breath. .  triamcinolone (NASACORT) 55 MCG/ACT AERO nasal inhaler, Place 2 sprays into the nose daily. .  fluticasone (FLONASE) 50 MCG/ACT nasal spray, Place 1-2 sprays into both nostrils daily for 10 days. (Patient taking differently: Place 1-2  sprays into both nostrils daily as needed for allergies. )  Current Outpatient Medications (Analgesics):  .  meloxicam (MOBIC) 15 MG tablet, Take 1 tablet (15 mg total) by mouth daily. (Patient taking differently: Take 15 mg by mouth at bedtime. ) .  Oxycodone HCl 10 MG TABS, Take 1-2 tablets (10-20 mg total) by mouth every 4 (four) hours as needed (pain).   Current Outpatient Medications (Other):  Marland Kitchen  DULoxetine (CYMBALTA) 60 MG capsule, Take 60 mg by mouth daily.  Marland Kitchen  gabapentin (NEURONTIN) 300 MG capsule, Take 1 capsule (300 mg total) by mouth 3 (three) times daily. (Patient taking differently: Take 300 mg by mouth 4 (four) times daily. ) .  Multiple Vitamin (MULTIVITAMIN WITH MINERALS) TABS tablet, Take 1 tablet by mouth daily. Marland Kitchen  tiZANidine (ZANAFLEX) 4 MG tablet, Take 4 mg by mouth 3 (three) times daily.    Reviewed prior external information including notes and imaging from  primary care provider As well as notes that were available from care everywhere and other healthcare systems.  Past medical history, social, surgical and family history all reviewed in electronic medical record.  No pertanent information unless stated regarding to the chief complaint.   Review of Systems:  No headache, visual changes, nausea, vomiting, diarrhea, constipation, dizziness, abdominal pain, skin rash, fevers, chills, night sweats, weight loss, swollen lymph nodes, , joint swelling, chest pain, shortness of breath, mood changes. POSITIVE muscle aches, body aches  Objective  Blood pressure 130/88, pulse 92, height 5\' 11"  (1.803 m), weight 268 lb (121.6 kg), SpO2 95 %.   General: No apparent distress alert and oriented x3 mood and affect normal, dressed appropriately.  HEENT: Pupils equal, extraocular movements intact  Respiratory: Patient's speak in full sentences and does not appear short of breath  Patient does have multiple bruises noted on his skin. Knee: Left valgus deformity noted.  Abnormal  thigh to calf ratio.  Trace effusion noted Tender to palpation over medial and PF joint line.  ROM full in flexion and extension and lower leg rotation. instability with valgus force.  painful patellar compression. Patellar glide with moderate crepitus. Patellar and quadriceps tendons unremarkable. Hamstring and quadriceps strength is normal. Contralateral knee shows mild arthritic changes  Limited musculoskeletal ultrasound was performed and interpreted by Lyndal Pulley  Limited ultrasound of patient's left knee shows that patient does have very trace effusion noted.  Patient does have moderate narrowing of the medial joint space.  Otherwise fairly unremarkable.  After informed written and verbal consent, patient was seated on exam table. Left knee was prepped with alcohol swab and utilizing anterolateral approach, patient's left knee space was injected with 4:1  marcaine 0.5%: Kenalog 40mg /dL. Patient tolerated the procedure well without immediate complications.   Impression and Recommendations:     The above documentation has been reviewed and is accurate and complete Lyndal Pulley, DO

## 2020-02-26 ENCOUNTER — Encounter: Payer: Self-pay | Admitting: Family Medicine

## 2020-02-26 ENCOUNTER — Ambulatory Visit: Payer: Medicare Other

## 2020-02-26 ENCOUNTER — Other Ambulatory Visit: Payer: Self-pay

## 2020-02-26 ENCOUNTER — Ambulatory Visit: Payer: Self-pay

## 2020-02-26 ENCOUNTER — Ambulatory Visit: Payer: Medicare Other | Admitting: Family Medicine

## 2020-02-26 VITALS — BP 130/88 | HR 92 | Ht 71.0 in | Wt 268.0 lb

## 2020-02-26 DIAGNOSIS — M170 Bilateral primary osteoarthritis of knee: Secondary | ICD-10-CM | POA: Insufficient documentation

## 2020-02-26 DIAGNOSIS — Z981 Arthrodesis status: Secondary | ICD-10-CM | POA: Diagnosis not present

## 2020-02-26 DIAGNOSIS — M1712 Unilateral primary osteoarthritis, left knee: Secondary | ICD-10-CM | POA: Diagnosis not present

## 2020-02-26 DIAGNOSIS — M25562 Pain in left knee: Secondary | ICD-10-CM | POA: Diagnosis not present

## 2020-02-26 DIAGNOSIS — R238 Other skin changes: Secondary | ICD-10-CM

## 2020-02-26 DIAGNOSIS — G8929 Other chronic pain: Secondary | ICD-10-CM | POA: Diagnosis not present

## 2020-02-26 DIAGNOSIS — R233 Spontaneous ecchymoses: Secondary | ICD-10-CM

## 2020-02-26 DIAGNOSIS — M25561 Pain in right knee: Secondary | ICD-10-CM

## 2020-02-26 NOTE — Patient Instructions (Signed)
Xrays on the way out Send me a message after nerve test to see what they have found Injected the knee today See me again in 6-8 weeks we will discuss gel injections

## 2020-02-26 NOTE — Assessment & Plan Note (Signed)
Recent work-up by primary care for the easy bruising showed only mild elevation in white blood cells otherwise unremarkable.  Downtrending of the white blood cells noted as well since his hospitalization.

## 2020-02-26 NOTE — Assessment & Plan Note (Signed)
Concerned that patient is having more radicular symptoms that is contributing to more the leg pain.  Patient is going to have a nerve conduction test in the near future.

## 2020-02-26 NOTE — Assessment & Plan Note (Signed)
Patient given injection and tolerated the procedure well, discussed icing regimen and home exercise, discussed which activities to doing which wants to avoid.  Patient is to increase activity slowly.  Patient could be a candidate for viscosupplementation.  Once again the patient's medications do not want him having too many at the moment.  Patient has had a lot of different medical problems recently.  We discussed laboratory work-up which patient declined.  Follow-up with me again in 6 to 8 weeks

## 2020-02-27 ENCOUNTER — Encounter: Payer: Self-pay | Admitting: Internal Medicine

## 2020-02-27 NOTE — Assessment & Plan Note (Signed)
stable overall by history and exam, recent data reviewed with pt, and pt to continue medical treatment as before,  to f/u any worsening symptoms or concerns  

## 2020-02-27 NOTE — Assessment & Plan Note (Signed)

## 2020-02-28 DIAGNOSIS — M5416 Radiculopathy, lumbar region: Secondary | ICD-10-CM | POA: Diagnosis not present

## 2020-03-05 DIAGNOSIS — X32XXXD Exposure to sunlight, subsequent encounter: Secondary | ICD-10-CM | POA: Diagnosis not present

## 2020-03-05 DIAGNOSIS — Z08 Encounter for follow-up examination after completed treatment for malignant neoplasm: Secondary | ICD-10-CM | POA: Diagnosis not present

## 2020-03-05 DIAGNOSIS — Z85828 Personal history of other malignant neoplasm of skin: Secondary | ICD-10-CM | POA: Diagnosis not present

## 2020-03-05 DIAGNOSIS — L570 Actinic keratosis: Secondary | ICD-10-CM | POA: Diagnosis not present

## 2020-03-05 DIAGNOSIS — D692 Other nonthrombocytopenic purpura: Secondary | ICD-10-CM | POA: Diagnosis not present

## 2020-03-17 ENCOUNTER — Other Ambulatory Visit: Payer: Self-pay

## 2020-03-17 ENCOUNTER — Emergency Department (HOSPITAL_COMMUNITY)
Admission: EM | Admit: 2020-03-17 | Discharge: 2020-03-18 | Disposition: A | Discharge status: Home / Self Care | Payer: Medicare Other | Attending: Emergency Medicine | Admitting: Emergency Medicine

## 2020-03-17 ENCOUNTER — Encounter (HOSPITAL_COMMUNITY): Payer: Self-pay | Admitting: *Deleted

## 2020-03-17 ENCOUNTER — Emergency Department (HOSPITAL_COMMUNITY): Payer: Medicare Other

## 2020-03-17 DIAGNOSIS — R079 Chest pain, unspecified: Secondary | ICD-10-CM | POA: Diagnosis not present

## 2020-03-17 DIAGNOSIS — Z5321 Procedure and treatment not carried out due to patient leaving prior to being seen by health care provider: Secondary | ICD-10-CM | POA: Diagnosis not present

## 2020-03-17 DIAGNOSIS — R0602 Shortness of breath: Secondary | ICD-10-CM | POA: Insufficient documentation

## 2020-03-17 LAB — BASIC METABOLIC PANEL
Anion gap: 13 (ref 5–15)
BUN: 7 mg/dL (ref 6–20)
CO2: 25 mmol/L (ref 22–32)
Calcium: 9.4 mg/dL (ref 8.9–10.3)
Chloride: 100 mmol/L (ref 98–111)
Creatinine, Ser: 0.8 mg/dL (ref 0.61–1.24)
GFR, Estimated: >60 mL/min (ref >60)
Glucose, Bld: 104 mg/dL — ABNORMAL HIGH (ref 70–99)
Potassium: 5.2 mmol/L — ABNORMAL HIGH (ref 3.5–5.1)
Sodium: 138 mmol/L (ref 135–145)

## 2020-03-17 LAB — CBC
HCT: 46.7 % (ref 39.0–52.0)
Hemoglobin: 15.1 g/dL (ref 13.0–17.0)
MCH: 31.1 pg (ref 26.0–34.0)
MCHC: 32.3 g/dL (ref 30.0–36.0)
MCV: 96.1 fL (ref 80.0–100.0)
Platelets: 306 10*3/uL (ref 150–400)
RBC: 4.86 MIL/uL (ref 4.22–5.81)
RDW: 13.7 % (ref 11.5–15.5)
WBC: 9.8 10*3/uL (ref 4.0–10.5)
nRBC: 0 % (ref 0.0–0.2)

## 2020-03-17 LAB — TROPONIN I (HIGH SENSITIVITY): Troponin I (High Sensitivity): 4 ng/L (ref <18)

## 2020-03-17 NOTE — ED Triage Notes (Signed)
Pt started having chest pain tonight at home. 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 Described as being punched in the chest with mild sob

## 2020-03-18 ENCOUNTER — Other Ambulatory Visit: Payer: Self-pay | Admitting: Internal Medicine

## 2020-03-18 LAB — TROPONIN I (HIGH SENSITIVITY): Troponin I (High Sensitivity): 6 ng/L (ref <18)

## 2020-03-18 NOTE — ED Notes (Signed)
Pt said he was feeling better and was going to call his wife to come get him Pt left.

## 2020-03-18 NOTE — Telephone Encounter (Signed)
Please refill as per office routine med refill policy (all routine meds refilled for 3 mo or monthly per pt preference up to one year from last visit, then month to month grace period for 3 mo, then further med refills will have to be denied)  

## 2020-03-26 DIAGNOSIS — M5442 Lumbago with sciatica, left side: Secondary | ICD-10-CM | POA: Diagnosis not present

## 2020-03-31 ENCOUNTER — Other Ambulatory Visit: Payer: Self-pay | Admitting: Neurological Surgery

## 2020-03-31 DIAGNOSIS — G8929 Other chronic pain: Secondary | ICD-10-CM

## 2020-04-01 ENCOUNTER — Encounter: Payer: Self-pay | Admitting: Gastroenterology

## 2020-04-02 ENCOUNTER — Ambulatory Visit
Admission: RE | Admit: 2020-04-02 | Discharge: 2020-04-02 | Disposition: A | Discharge status: Home / Self Care | Payer: Medicare Other | Source: Ambulatory Visit | Attending: Neurological Surgery | Admitting: Neurological Surgery

## 2020-04-02 ENCOUNTER — Other Ambulatory Visit: Payer: Self-pay

## 2020-04-02 DIAGNOSIS — G8929 Other chronic pain: Secondary | ICD-10-CM

## 2020-04-02 DIAGNOSIS — M545 Low back pain, unspecified: Secondary | ICD-10-CM | POA: Diagnosis not present

## 2020-04-10 DIAGNOSIS — S32009K Unspecified fracture of unspecified lumbar vertebra, subsequent encounter for fracture with nonunion: Secondary | ICD-10-CM | POA: Insufficient documentation

## 2020-04-10 DIAGNOSIS — R030 Elevated blood-pressure reading, without diagnosis of hypertension: Secondary | ICD-10-CM | POA: Diagnosis not present

## 2020-04-10 DIAGNOSIS — M5416 Radiculopathy, lumbar region: Secondary | ICD-10-CM | POA: Diagnosis not present

## 2020-04-17 ENCOUNTER — Other Ambulatory Visit: Payer: Self-pay | Admitting: Neurological Surgery

## 2020-04-17 DIAGNOSIS — S32009K Unspecified fracture of unspecified lumbar vertebra, subsequent encounter for fracture with nonunion: Secondary | ICD-10-CM

## 2020-05-14 DIAGNOSIS — S32009K Unspecified fracture of unspecified lumbar vertebra, subsequent encounter for fracture with nonunion: Secondary | ICD-10-CM | POA: Diagnosis not present

## 2020-05-22 ENCOUNTER — Inpatient Hospital Stay (HOSPITAL_COMMUNITY): Admission: RE | Admit: 2020-05-22 | Payer: Medicare Other | Source: Ambulatory Visit

## 2020-05-22 ENCOUNTER — Other Ambulatory Visit (HOSPITAL_COMMUNITY): Payer: Medicare Other

## 2020-05-28 ENCOUNTER — Encounter: Payer: Medicare Other | Admitting: Gastroenterology

## 2020-06-02 NOTE — Pre-Procedure Instructions (Signed)
[id number] [address], [place] [address] Phone: [phone] Fax: [phone]      Your procedure is scheduled on Monday February 7th.  Report to [facility] Main Entrance "A" at 9:15 A.M., and check in at the Admitting office.  Call this number if you have problems the morning of surgery:  [phone]  Call [phone] if you have any questions prior to your surgery date Monday-Friday 8am-4pm    Remember:  Do not eat after midnight the night before your surgery    Take these medicines the morning of surgery with A SIP OF WATER  albuterol (Ventolin HFA) if needed atorvastatin (Lipitor) duloxetine (Cymbalta) gabapentin (Neurontin)  oxycodone-if needed tizanidine (Zanaflex)- if needed  As of today, STOP taking any Aspirin (unless otherwise instructed by your surgeon) Aleve, Naproxen, Ibuprofen, Motrin, Advil, Goody's, BC's, all herbal medications, fish oil, and all vitamins.                      Do not wear jewelry, make up, or nail polish            Do not wear lotions, powders, perfumes/colognes, or deodorant.            Do not shave 48 hours prior to surgery.  Men may shave face and neck.            Do not bring valuables to the hospital.            [facility] is not responsible for any belongings or valuables.  Do NOT Smoke (Tobacco/Vaping) or drink Alcohol 24 hours prior to your procedure If you use a CPAP at night, you may bring all equipment for your overnight stay.   Contacts, glasses, dentures or bridgework may not be worn into surgery.      For patients admitted to the hospital, discharge time will be determined by your treatment team.   Patients discharged the day of surgery will not be allowed to drive home, and someone needs to stay with them for 24 hours.    Special instructions:   [place]- Preparing For Surgery  Before surgery, you can play an important role. Because skin is not sterile, your  skin needs to be as free of germs as possible. You can reduce the number of germs on your skin by washing with CHG (chlorahexidine gluconate) Soap before surgery.  CHG is an antiseptic cleaner which kills germs and bonds with the skin to continue killing germs even after washing.    Oral Hygiene is also important to reduce your risk of infection.  Remember - BRUSH YOUR TEETH THE MORNING OF SURGERY WITH YOUR REGULAR TOOTHPASTE  Please do not use if you have an allergy to CHG or antibacterial soaps. If your skin becomes reddened/irritated stop using the CHG.  Do not shave (including legs and underarms) for at least 48 hours prior to first CHG shower. It is OK to shave your face.  Please follow these instructions carefully.   1. Shower the NIGHT BEFORE SURGERY and the MORNING OF SURGERY with CHG Soap.   2. If you chose to wash your hair, wash your hair first as usual with your normal shampoo.  3. After you shampoo, rinse your hair and body thoroughly to remove the shampoo.  4. Use CHG as you would any other liquid soap. You can apply CHG directly to the skin and wash gently with a scrungie or a clean washcloth.  5. Apply the CHG Soap to your body ONLY FROM THE NECK DOWN.  Do not use on open wounds or open sores. Avoid contact with your eyes, ears, mouth and genitals (private parts). Wash Face and genitals (private parts)  with your normal soap.   6. Wash thoroughly, paying special attention to the area where your surgery will be performed.  7. Thoroughly rinse your body with warm water from the neck down.  8. DO NOT shower/wash with your normal soap after using and rinsing off the CHG Soap.  9. Pat yourself dry with a CLEAN TOWEL.  10. Wear CLEAN PAJAMAS to bed the night before surgery  11. Place CLEAN SHEETS on your bed the night of your first shower and DO NOT SLEEP WITH PETS.   Day of Surgery: Wear Clean/Comfortable clothing the morning of surgery Do not apply any  deodorants/lotions.   Remember to brush your teeth WITH YOUR REGULAR TOOTHPASTE.   Please read over the following fact sheets that you were given.

## 2020-06-03 ENCOUNTER — Ambulatory Visit (HOSPITAL_COMMUNITY)
Admission: RE | Admit: 2020-06-03 | Discharge: 2020-06-03 | Disposition: A | Discharge status: Home / Self Care | Payer: Medicare Other | Source: Ambulatory Visit | Attending: Neurological Surgery | Admitting: Neurological Surgery

## 2020-06-03 ENCOUNTER — Other Ambulatory Visit: Payer: Self-pay

## 2020-06-03 ENCOUNTER — Encounter (HOSPITAL_COMMUNITY)
Admission: RE | Admit: 2020-06-03 | Discharge: 2020-06-03 | Disposition: A | Discharge status: Home / Self Care | Payer: Medicare Other | Source: Ambulatory Visit | Attending: Neurological Surgery | Admitting: Neurological Surgery

## 2020-06-03 ENCOUNTER — Encounter (HOSPITAL_COMMUNITY): Payer: Self-pay

## 2020-06-03 ENCOUNTER — Telehealth: Payer: Self-pay | Admitting: Internal Medicine

## 2020-06-03 DIAGNOSIS — Z01818 Encounter for other preprocedural examination: Secondary | ICD-10-CM | POA: Diagnosis not present

## 2020-06-03 DIAGNOSIS — S32009K Unspecified fracture of unspecified lumbar vertebra, subsequent encounter for fracture with nonunion: Secondary | ICD-10-CM

## 2020-06-03 DIAGNOSIS — X58XXXD Exposure to other specified factors, subsequent encounter: Secondary | ICD-10-CM | POA: Insufficient documentation

## 2020-06-03 HISTORY — DX: Headache, unspecified: R51.9

## 2020-06-03 LAB — CBC WITH DIFFERENTIAL/PLATELET
Abs Immature Granulocytes: 0.11 10*3/uL — ABNORMAL HIGH (ref 0.00–0.07)
Basophils Absolute: 0.1 10*3/uL (ref 0.0–0.1)
Basophils Relative: 1 %
Eosinophils Absolute: 0.3 10*3/uL (ref 0.0–0.5)
Eosinophils Relative: 3 %
HCT: 46 % (ref 39.0–52.0)
Hemoglobin: 15.3 g/dL (ref 13.0–17.0)
Immature Granulocytes: 1 %
Lymphocytes Relative: 16 %
Lymphs Abs: 2 10*3/uL (ref 0.7–4.0)
MCH: 31.5 pg (ref 26.0–34.0)
MCHC: 33.3 g/dL (ref 30.0–36.0)
MCV: 94.7 fL (ref 80.0–100.0)
Monocytes Absolute: 0.9 10*3/uL (ref 0.1–1.0)
Monocytes Relative: 7 %
Neutro Abs: 9.2 10*3/uL — ABNORMAL HIGH (ref 1.7–7.7)
Neutrophils Relative %: 72 %
Platelets: 326 10*3/uL (ref 150–400)
RBC: 4.86 MIL/uL (ref 4.22–5.81)
RDW: 13.9 % (ref 11.5–15.5)
WBC: 12.8 10*3/uL — ABNORMAL HIGH (ref 4.0–10.5)
nRBC: 0 % (ref 0.0–0.2)

## 2020-06-03 LAB — TYPE AND SCREEN
ABO/RH(D): O POS
Antibody Screen: NEGATIVE

## 2020-06-03 LAB — BASIC METABOLIC PANEL
Anion gap: 9 (ref 5–15)
BUN: 11 mg/dL (ref 6–20)
CO2: 25 mmol/L (ref 22–32)
Calcium: 9.5 mg/dL (ref 8.9–10.3)
Chloride: 104 mmol/L (ref 98–111)
Creatinine, Ser: 0.69 mg/dL (ref 0.61–1.24)
GFR, Estimated: >60 mL/min (ref >60)
Glucose, Bld: 122 mg/dL — ABNORMAL HIGH (ref 70–99)
Potassium: 4.7 mmol/L (ref 3.5–5.1)
Sodium: 138 mmol/L (ref 135–145)

## 2020-06-03 LAB — PROTIME-INR
INR: 0.9 (ref 0.8–1.2)
Prothrombin Time: 11.7 seconds (ref 11.4–15.2)

## 2020-06-03 LAB — SURGICAL PCR SCREEN
MRSA, PCR: NEGATIVE
Staphylococcus aureus: NEGATIVE

## 2020-06-03 NOTE — Progress Notes (Signed)
PCP - Dr. Cathlean Cower Cardiologist - Denies  Chest x-ray - 06/03/20 EKG - 06/03/20 Stress Test - Long time ago ECHO - "I think so" same time as stress test  Sleep Study - No OSA  DM - Denies  COVID TEST- 06/03/20   Anesthesia review: No  Patient denies shortness of breath, fever, cough and chest pain at PAT appointment   All instructions explained to the patient, with a verbal understanding of the material. Patient agrees to go over the instructions while at home for a better understanding. Patient also instructed to self quarantine after being tested for COVID-19. The opportunity to ask questions was provided.

## 2020-06-03 NOTE — Telephone Encounter (Signed)
Ok to please contact pt -   We received a screening test for him which suggested possible sleep apnea  Can I refer him to pulmonary for further consideration?    Please let me know

## 2020-06-04 ENCOUNTER — Telehealth: Payer: Self-pay

## 2020-06-04 DIAGNOSIS — R069 Unspecified abnormalities of breathing: Secondary | ICD-10-CM

## 2020-06-04 NOTE — Telephone Encounter (Signed)
Patient is returning call.  °

## 2020-06-04 NOTE — Telephone Encounter (Signed)
Left pt vc to return the office call

## 2020-06-05 NOTE — Telephone Encounter (Signed)
Can I refer him to pulmonary for further consideration?     Patient states he is having back surgery Monday...Marland KitchenMarland Kitchenthe referral can be put in but he will be down awhile.

## 2020-06-05 NOTE — Addendum Note (Signed)
Addended by: [name] on: 06/05/2020 09:11 PM   Modules accepted: Orders

## 2020-06-06 ENCOUNTER — Other Ambulatory Visit (HOSPITAL_COMMUNITY)
Admission: RE | Admit: 2020-06-06 | Discharge: 2020-06-06 | Disposition: A | Discharge status: Home / Self Care | Payer: Medicare Other | Source: Ambulatory Visit | Attending: Neurological Surgery | Admitting: Neurological Surgery

## 2020-06-06 DIAGNOSIS — Z01812 Encounter for preprocedural laboratory examination: Secondary | ICD-10-CM | POA: Insufficient documentation

## 2020-06-06 DIAGNOSIS — M5117 Intervertebral disc disorders with radiculopathy, lumbosacral region: Secondary | ICD-10-CM | POA: Diagnosis not present

## 2020-06-06 DIAGNOSIS — M479 Spondylosis, unspecified: Secondary | ICD-10-CM | POA: Diagnosis not present

## 2020-06-06 DIAGNOSIS — J449 Chronic obstructive pulmonary disease, unspecified: Secondary | ICD-10-CM | POA: Diagnosis not present

## 2020-06-06 DIAGNOSIS — F17210 Nicotine dependence, cigarettes, uncomplicated: Secondary | ICD-10-CM | POA: Diagnosis not present

## 2020-06-06 DIAGNOSIS — Z79899 Other long term (current) drug therapy: Secondary | ICD-10-CM | POA: Diagnosis not present

## 2020-06-06 DIAGNOSIS — Z791 Long term (current) use of non-steroidal anti-inflammatories (NSAID): Secondary | ICD-10-CM | POA: Diagnosis not present

## 2020-06-06 DIAGNOSIS — Z818 Family history of other mental and behavioral disorders: Secondary | ICD-10-CM | POA: Diagnosis not present

## 2020-06-06 DIAGNOSIS — M545 Low back pain, unspecified: Secondary | ICD-10-CM | POA: Diagnosis present

## 2020-06-06 DIAGNOSIS — K219 Gastro-esophageal reflux disease without esophagitis: Secondary | ICD-10-CM | POA: Diagnosis not present

## 2020-06-06 DIAGNOSIS — M48061 Spinal stenosis, lumbar region without neurogenic claudication: Secondary | ICD-10-CM | POA: Diagnosis not present

## 2020-06-06 DIAGNOSIS — Z6837 Body mass index (BMI) 37.0-37.9, adult: Secondary | ICD-10-CM | POA: Diagnosis not present

## 2020-06-06 DIAGNOSIS — E669 Obesity, unspecified: Secondary | ICD-10-CM | POA: Diagnosis present

## 2020-06-06 DIAGNOSIS — M4326 Fusion of spine, lumbar region: Secondary | ICD-10-CM | POA: Diagnosis not present

## 2020-06-06 DIAGNOSIS — M5116 Intervertebral disc disorders with radiculopathy, lumbar region: Secondary | ICD-10-CM | POA: Diagnosis not present

## 2020-06-06 DIAGNOSIS — M9983 Other biomechanical lesions of lumbar region: Secondary | ICD-10-CM | POA: Diagnosis not present

## 2020-06-06 DIAGNOSIS — E785 Hyperlipidemia, unspecified: Secondary | ICD-10-CM | POA: Diagnosis not present

## 2020-06-06 DIAGNOSIS — F32A Depression, unspecified: Secondary | ICD-10-CM | POA: Diagnosis not present

## 2020-06-06 DIAGNOSIS — F419 Anxiety disorder, unspecified: Secondary | ICD-10-CM | POA: Diagnosis present

## 2020-06-06 DIAGNOSIS — M21372 Foot drop, left foot: Secondary | ICD-10-CM | POA: Diagnosis not present

## 2020-06-06 DIAGNOSIS — M5416 Radiculopathy, lumbar region: Secondary | ICD-10-CM | POA: Diagnosis not present

## 2020-06-06 DIAGNOSIS — Z20822 Contact with and (suspected) exposure to covid-19: Secondary | ICD-10-CM | POA: Insufficient documentation

## 2020-06-06 DIAGNOSIS — Z981 Arthrodesis status: Secondary | ICD-10-CM | POA: Diagnosis not present

## 2020-06-06 DIAGNOSIS — Y838 Other surgical procedures as the cause of abnormal reaction of the patient, or of later complication, without mention of misadventure at the time of the procedure: Secondary | ICD-10-CM | POA: Diagnosis present

## 2020-06-06 DIAGNOSIS — Z881 Allergy status to other antibiotic agents status: Secondary | ICD-10-CM | POA: Diagnosis not present

## 2020-06-06 DIAGNOSIS — M4807 Spinal stenosis, lumbosacral region: Secondary | ICD-10-CM | POA: Diagnosis not present

## 2020-06-06 DIAGNOSIS — M960 Pseudarthrosis after fusion or arthrodesis: Secondary | ICD-10-CM | POA: Diagnosis not present

## 2020-06-06 DIAGNOSIS — M5136 Other intervertebral disc degeneration, lumbar region: Secondary | ICD-10-CM | POA: Diagnosis not present

## 2020-06-06 LAB — SARS CORONAVIRUS 2 (TAT 6-24 HRS): SARS Coronavirus 2: NEGATIVE

## 2020-06-09 ENCOUNTER — Encounter (HOSPITAL_COMMUNITY)
Admission: RE | Disposition: A | Discharge status: Home / Self Care | Payer: Self-pay | Source: Home / Self Care | Attending: Neurological Surgery

## 2020-06-09 ENCOUNTER — Inpatient Hospital Stay (HOSPITAL_COMMUNITY): Payer: Medicare Other | Admitting: Anesthesiology

## 2020-06-09 ENCOUNTER — Inpatient Hospital Stay (HOSPITAL_COMMUNITY): Payer: Medicare Other

## 2020-06-09 ENCOUNTER — Inpatient Hospital Stay (HOSPITAL_COMMUNITY)
Admission: RE | Admit: 2020-06-09 | Discharge: 2020-06-10 | DRG: 454 | Disposition: A | Discharge status: Home / Self Care | Payer: Medicare Other | Attending: Neurological Surgery | Admitting: Neurological Surgery

## 2020-06-09 ENCOUNTER — Encounter (HOSPITAL_COMMUNITY): Payer: Self-pay | Admitting: Neurological Surgery

## 2020-06-09 ENCOUNTER — Other Ambulatory Visit: Payer: Self-pay

## 2020-06-09 DIAGNOSIS — Y838 Other surgical procedures as the cause of abnormal reaction of the patient, or of later complication, without mention of misadventure at the time of the procedure: Secondary | ICD-10-CM | POA: Diagnosis present

## 2020-06-09 DIAGNOSIS — Z818 Family history of other mental and behavioral disorders: Secondary | ICD-10-CM | POA: Diagnosis not present

## 2020-06-09 DIAGNOSIS — M48061 Spinal stenosis, lumbar region without neurogenic claudication: Principal | ICD-10-CM | POA: Diagnosis present

## 2020-06-09 DIAGNOSIS — E669 Obesity, unspecified: Secondary | ICD-10-CM | POA: Diagnosis present

## 2020-06-09 DIAGNOSIS — Z6837 Body mass index (BMI) 37.0-37.9, adult: Secondary | ICD-10-CM

## 2020-06-09 DIAGNOSIS — F32A Depression, unspecified: Secondary | ICD-10-CM | POA: Diagnosis present

## 2020-06-09 DIAGNOSIS — K219 Gastro-esophageal reflux disease without esophagitis: Secondary | ICD-10-CM | POA: Diagnosis present

## 2020-06-09 DIAGNOSIS — J449 Chronic obstructive pulmonary disease, unspecified: Secondary | ICD-10-CM | POA: Diagnosis present

## 2020-06-09 DIAGNOSIS — M4807 Spinal stenosis, lumbosacral region: Secondary | ICD-10-CM | POA: Diagnosis present

## 2020-06-09 DIAGNOSIS — M21372 Foot drop, left foot: Secondary | ICD-10-CM | POA: Diagnosis present

## 2020-06-09 DIAGNOSIS — M479 Spondylosis, unspecified: Secondary | ICD-10-CM | POA: Diagnosis present

## 2020-06-09 DIAGNOSIS — Z20822 Contact with and (suspected) exposure to covid-19: Secondary | ICD-10-CM | POA: Diagnosis present

## 2020-06-09 DIAGNOSIS — Z79899 Other long term (current) drug therapy: Secondary | ICD-10-CM

## 2020-06-09 DIAGNOSIS — M960 Pseudarthrosis after fusion or arthrodesis: Secondary | ICD-10-CM | POA: Diagnosis present

## 2020-06-09 DIAGNOSIS — F17210 Nicotine dependence, cigarettes, uncomplicated: Secondary | ICD-10-CM | POA: Diagnosis present

## 2020-06-09 DIAGNOSIS — Z419 Encounter for procedure for purposes other than remedying health state, unspecified: Secondary | ICD-10-CM

## 2020-06-09 DIAGNOSIS — E785 Hyperlipidemia, unspecified: Secondary | ICD-10-CM | POA: Diagnosis present

## 2020-06-09 DIAGNOSIS — Z791 Long term (current) use of non-steroidal anti-inflammatories (NSAID): Secondary | ICD-10-CM | POA: Diagnosis not present

## 2020-06-09 DIAGNOSIS — M5116 Intervertebral disc disorders with radiculopathy, lumbar region: Secondary | ICD-10-CM | POA: Diagnosis present

## 2020-06-09 DIAGNOSIS — M545 Low back pain, unspecified: Secondary | ICD-10-CM | POA: Diagnosis present

## 2020-06-09 DIAGNOSIS — F419 Anxiety disorder, unspecified: Secondary | ICD-10-CM | POA: Diagnosis present

## 2020-06-09 DIAGNOSIS — M4326 Fusion of spine, lumbar region: Secondary | ICD-10-CM | POA: Diagnosis not present

## 2020-06-09 DIAGNOSIS — Z881 Allergy status to other antibiotic agents status: Secondary | ICD-10-CM | POA: Diagnosis not present

## 2020-06-09 DIAGNOSIS — Z981 Arthrodesis status: Secondary | ICD-10-CM | POA: Diagnosis not present

## 2020-06-09 HISTORY — PX: LUMBAR LAMINECTOMY/DECOMPRESSION MICRODISCECTOMY: SHX5026

## 2020-06-09 SURGERY — POSTERIOR LUMBAR FUSION 1 LEVEL
Anesthesia: General | Site: Back

## 2020-06-09 MED ORDER — FENTANYL CITRATE (PF) 250 MCG/5ML IJ SOLN
INTRAMUSCULAR | Status: AC
  Filled 2020-06-09: qty 5

## 2020-06-09 MED ORDER — PROPOFOL 10 MG/ML IV BOLUS
INTRAVENOUS | Status: DC | PRN
  Administered 2020-06-09: 200 mg via INTRAVENOUS

## 2020-06-09 MED ORDER — SODIUM CHLORIDE 0.9 % IV SOLN: 250.0000 mL | INTRAVENOUS | Status: DC

## 2020-06-09 MED ORDER — SODIUM CHLORIDE 0.9% FLUSH: 9.0000 mL | INTRAVENOUS | Status: DC | PRN

## 2020-06-09 MED ORDER — ACETAMINOPHEN 650 MG RE SUPP
650.0000 mg | RECTAL | Status: DC | PRN
Start: 2020-06-09 — End: 2020-06-10

## 2020-06-09 MED ORDER — GABAPENTIN 300 MG PO CAPS: 300.0000 mg | ORAL_CAPSULE | ORAL | Status: DC

## 2020-06-09 MED ORDER — DEXAMETHASONE 4 MG PO TABS
4.0000 mg | ORAL_TABLET | Freq: Four times a day (QID) | ORAL | Status: DC
  Administered 2020-06-09 – 2020-06-10 (×4): 4 mg via ORAL
  Filled 2020-06-09 (×4): qty 1

## 2020-06-09 MED ORDER — SODIUM CHLORIDE 0.9% FLUSH
3.0000 mL | Freq: Two times a day (BID) | INTRAVENOUS | Status: DC
  Administered 2020-06-09: 3 mL via INTRAVENOUS

## 2020-06-09 MED ORDER — MIDAZOLAM HCL 2 MG/2ML IJ SOLN
INTRAMUSCULAR | Status: AC
  Filled 2020-06-09: qty 2

## 2020-06-09 MED ORDER — GABAPENTIN 300 MG PO CAPS
300.0000 mg | ORAL_CAPSULE | Freq: Four times a day (QID) | ORAL | Status: DC
  Administered 2020-06-09 – 2020-06-10 (×3): 300 mg via ORAL
  Filled 2020-06-09 (×3): qty 1

## 2020-06-09 MED ORDER — MIDAZOLAM HCL 5 MG/5ML IJ SOLN
INTRAMUSCULAR | Status: DC | PRN
  Administered 2020-06-09: 2 mg via INTRAVENOUS

## 2020-06-09 MED ORDER — SUGAMMADEX SODIUM 200 MG/2ML IV SOLN
INTRAVENOUS | Status: DC | PRN
  Administered 2020-06-09: 200 mg via INTRAVENOUS

## 2020-06-09 MED ORDER — SENNA 8.6 MG PO TABS
1.0000 | ORAL_TABLET | Freq: Two times a day (BID) | ORAL | Status: DC
  Administered 2020-06-09 – 2020-06-10 (×2): 8.6 mg via ORAL
  Filled 2020-06-09 (×2): qty 1

## 2020-06-09 MED ORDER — LACTATED RINGERS IV SOLN: INTRAVENOUS | Status: DC

## 2020-06-09 MED ORDER — ACETAMINOPHEN 500 MG PO TABS
1000.0000 mg | ORAL_TABLET | ORAL | Status: AC
  Administered 2020-06-09: 1000 mg via ORAL
  Filled 2020-06-09: qty 2

## 2020-06-09 MED ORDER — VANCOMYCIN HCL IN DEXTROSE 1-5 GM/200ML-% IV SOLN
1000.0000 mg | Freq: Two times a day (BID) | INTRAVENOUS | Status: DC
  Administered 2020-06-09: 21:00:00 1000 mg via INTRAVENOUS
  Filled 2020-06-09 (×2): qty 200

## 2020-06-09 MED ORDER — PROPOFOL 10 MG/ML IV BOLUS
INTRAVENOUS | Status: AC
  Filled 2020-06-09: qty 20

## 2020-06-09 MED ORDER — HYDROMORPHONE 1 MG/ML IV SOLN
INTRAVENOUS | Status: AC
  Filled 2020-06-09: qty 30

## 2020-06-09 MED ORDER — ONDANSETRON HCL 4 MG/2ML IJ SOLN: 4.0000 mg | Freq: Four times a day (QID) | INTRAMUSCULAR | Status: DC | PRN

## 2020-06-09 MED ORDER — ONDANSETRON HCL 4 MG/2ML IJ SOLN
INTRAMUSCULAR | Status: DC | PRN
  Administered 2020-06-09: 4 mg via INTRAVENOUS

## 2020-06-09 MED ORDER — VITAMIN D 25 MCG (1000 UNIT) PO TABS
1000.0000 [IU] | ORAL_TABLET | Freq: Every day | ORAL | Status: DC
  Administered 2020-06-10: 1000 [IU] via ORAL
  Filled 2020-06-09: qty 1

## 2020-06-09 MED ORDER — ROCURONIUM BROMIDE 10 MG/ML (PF) SYRINGE
PREFILLED_SYRINGE | INTRAVENOUS | Status: AC
  Filled 2020-06-09: qty 10

## 2020-06-09 MED ORDER — ADULT MULTIVITAMIN W/MINERALS CH
1.0000 | ORAL_TABLET | Freq: Every day | ORAL | Status: DC
  Administered 2020-06-10: 1 via ORAL
  Filled 2020-06-09: qty 1

## 2020-06-09 MED ORDER — 0.9 % SODIUM CHLORIDE (POUR BTL) OPTIME
TOPICAL | Status: DC | PRN
  Administered 2020-06-09: 1000 mL

## 2020-06-09 MED ORDER — HYDROMORPHONE HCL 1 MG/ML IJ SOLN
INTRAMUSCULAR | Status: DC | PRN
  Administered 2020-06-09 (×2): .5 mg via INTRAVENOUS

## 2020-06-09 MED ORDER — MENTHOL 3 MG MT LOZG: 1.0000 | LOZENGE | OROMUCOSAL | Status: DC | PRN

## 2020-06-09 MED ORDER — VITAMIN B-12 1000 MCG PO TABS
1000.0000 ug | ORAL_TABLET | Freq: Every day | ORAL | Status: DC
  Administered 2020-06-10: 1000 ug via ORAL
  Filled 2020-06-09: qty 1

## 2020-06-09 MED ORDER — ONDANSETRON HCL 4 MG PO TABS: 4.0000 mg | ORAL_TABLET | Freq: Four times a day (QID) | ORAL | Status: DC | PRN

## 2020-06-09 MED ORDER — PHENOL 1.4 % MT LIQD: 1.0000 | OROMUCOSAL | Status: DC | PRN

## 2020-06-09 MED ORDER — FENTANYL CITRATE (PF) 100 MCG/2ML IJ SOLN
INTRAMUSCULAR | Status: DC | PRN
  Administered 2020-06-09: 50 ug via INTRAVENOUS
  Administered 2020-06-09 (×2): 100 ug via INTRAVENOUS

## 2020-06-09 MED ORDER — NALOXONE HCL 0.4 MG/ML IJ SOLN: 0.4000 mg | INTRAMUSCULAR | Status: DC | PRN

## 2020-06-09 MED ORDER — VANCOMYCIN HCL 1000 MG IV SOLR
INTRAVENOUS | Status: DC | PRN
  Administered 2020-06-09: 1000 mg via INTRAVENOUS

## 2020-06-09 MED ORDER — THROMBIN 20000 UNITS EX SOLR
CUTANEOUS | Status: AC
  Filled 2020-06-09: qty 20000

## 2020-06-09 MED ORDER — ASCORBIC ACID 500 MG PO TABS
1000.0000 mg | ORAL_TABLET | Freq: Every day | ORAL | Status: DC
  Administered 2020-06-10: 1000 mg via ORAL
  Filled 2020-06-09: qty 2

## 2020-06-09 MED ORDER — DIPHENHYDRAMINE HCL 12.5 MG/5ML PO ELIX
12.5000 mg | ORAL_SOLUTION | Freq: Four times a day (QID) | ORAL | Status: DC | PRN
  Filled 2020-06-09: qty 5

## 2020-06-09 MED ORDER — POTASSIUM CHLORIDE IN NACL 20-0.9 MEQ/L-% IV SOLN: INTRAVENOUS | Status: DC

## 2020-06-09 MED ORDER — CHLORHEXIDINE GLUCONATE CLOTH 2 % EX PADS: 6.0000 | MEDICATED_PAD | Freq: Once | CUTANEOUS | Status: DC

## 2020-06-09 MED ORDER — SODIUM CHLORIDE 0.9% FLUSH: 3.0000 mL | INTRAVENOUS | Status: DC | PRN

## 2020-06-09 MED ORDER — DEXAMETHASONE SODIUM PHOSPHATE 10 MG/ML IJ SOLN
10.0000 mg | Freq: Once | INTRAMUSCULAR | Status: AC
  Administered 2020-06-09: 10 mg via INTRAVENOUS
  Filled 2020-06-09: qty 1

## 2020-06-09 MED ORDER — HYDROMORPHONE HCL 1 MG/ML IJ SOLN
INTRAMUSCULAR | Status: AC
  Filled 2020-06-09: qty 1

## 2020-06-09 MED ORDER — CELECOXIB 200 MG PO CAPS
200.0000 mg | ORAL_CAPSULE | Freq: Two times a day (BID) | ORAL | Status: DC
  Administered 2020-06-09 – 2020-06-10 (×2): 200 mg via ORAL
  Filled 2020-06-09 (×2): qty 1

## 2020-06-09 MED ORDER — LIDOCAINE HCL (CARDIAC) PF 100 MG/5ML IV SOSY
PREFILLED_SYRINGE | INTRAVENOUS | Status: DC | PRN
  Administered 2020-06-09: 100 mg via INTRAVENOUS

## 2020-06-09 MED ORDER — ALBUTEROL SULFATE (2.5 MG/3ML) 0.083% IN NEBU: 3.0000 mL | INHALATION_SOLUTION | Freq: Four times a day (QID) | RESPIRATORY_TRACT | Status: DC | PRN

## 2020-06-09 MED ORDER — BUPIVACAINE HCL (PF) 0.25 % IJ SOLN
INTRAMUSCULAR | Status: AC
  Filled 2020-06-09: qty 30

## 2020-06-09 MED ORDER — HYDROMORPHONE HCL 1 MG/ML IJ SOLN
0.2500 mg | INTRAMUSCULAR | Status: DC | PRN
  Administered 2020-06-09 (×2): 0.5 mg via INTRAVENOUS

## 2020-06-09 MED ORDER — VANCOMYCIN HCL IN DEXTROSE 1-5 GM/200ML-% IV SOLN
1000.0000 mg | INTRAVENOUS | Status: AC
  Administered 2020-06-09: 1000 mg via INTRAVENOUS
  Filled 2020-06-09: qty 200

## 2020-06-09 MED ORDER — ORAL CARE MOUTH RINSE: 15.0000 mL | Freq: Once | OROMUCOSAL | Status: AC

## 2020-06-09 MED ORDER — PHENYLEPHRINE HCL-NACL 10-0.9 MG/250ML-% IV SOLN
INTRAVENOUS | Status: DC | PRN
  Administered 2020-06-09: 35 ug/min via INTRAVENOUS

## 2020-06-09 MED ORDER — ROCURONIUM BROMIDE 10 MG/ML (PF) SYRINGE
PREFILLED_SYRINGE | INTRAVENOUS | Status: DC | PRN
  Administered 2020-06-09 (×2): 30 mg via INTRAVENOUS
  Administered 2020-06-09: 60 mg via INTRAVENOUS
  Administered 2020-06-09: 40 mg via INTRAVENOUS

## 2020-06-09 MED ORDER — PROMETHAZINE HCL 25 MG/ML IJ SOLN: 6.2500 mg | INTRAMUSCULAR | Status: DC | PRN

## 2020-06-09 MED ORDER — KETAMINE HCL 10 MG/ML IJ SOLN
INTRAMUSCULAR | Status: DC | PRN
  Administered 2020-06-09 (×3): 10 mg via INTRAVENOUS
  Administered 2020-06-09: 20 mg via INTRAVENOUS

## 2020-06-09 MED ORDER — CHLORHEXIDINE GLUCONATE 0.12 % MT SOLN
15.0000 mL | Freq: Once | OROMUCOSAL | Status: AC
  Administered 2020-06-09: 15 mL via OROMUCOSAL
  Filled 2020-06-09: qty 15

## 2020-06-09 MED ORDER — TIZANIDINE HCL 4 MG PO TABS
4.0000 mg | ORAL_TABLET | Freq: Three times a day (TID) | ORAL | Status: DC
  Administered 2020-06-09 – 2020-06-10 (×2): 4 mg via ORAL
  Filled 2020-06-09 (×2): qty 1

## 2020-06-09 MED ORDER — DULOXETINE HCL 30 MG PO CPEP
60.0000 mg | ORAL_CAPSULE | Freq: Every day | ORAL | Status: DC
  Administered 2020-06-09: 60 mg via ORAL
  Filled 2020-06-09: qty 2

## 2020-06-09 MED ORDER — BUPIVACAINE HCL (PF) 0.25 % IJ SOLN
INTRAMUSCULAR | Status: DC | PRN
  Administered 2020-06-09: 6 mL

## 2020-06-09 MED ORDER — VASOPRESSIN 20 UNIT/ML IV SOLN
INTRAVENOUS | Status: AC
  Filled 2020-06-09: qty 1

## 2020-06-09 MED ORDER — DEXAMETHASONE SODIUM PHOSPHATE 4 MG/ML IJ SOLN: 4.0000 mg | Freq: Four times a day (QID) | INTRAMUSCULAR | Status: DC

## 2020-06-09 MED ORDER — THROMBIN 5000 UNITS EX SOLR: OROMUCOSAL | Status: DC | PRN

## 2020-06-09 MED ORDER — OXYCODONE HCL 5 MG PO TABS
10.0000 mg | ORAL_TABLET | ORAL | Status: DC | PRN
  Administered 2020-06-09 – 2020-06-10 (×5): 10 mg via ORAL
  Filled 2020-06-09 (×5): qty 2

## 2020-06-09 MED ORDER — DIPHENHYDRAMINE HCL 50 MG/ML IJ SOLN: 12.5000 mg | Freq: Four times a day (QID) | INTRAMUSCULAR | Status: DC | PRN

## 2020-06-09 MED ORDER — THROMBIN 5000 UNITS EX SOLR
CUTANEOUS | Status: AC
  Filled 2020-06-09: qty 5000

## 2020-06-09 MED ORDER — HYDROMORPHONE 1 MG/ML IV SOLN
INTRAVENOUS | Status: DC
  Administered 2020-06-09: 2.5 mg via INTRAVENOUS
  Administered 2020-06-09: 30 mg via INTRAVENOUS
  Administered 2020-06-10: 0.2 mg via INTRAVENOUS
  Administered 2020-06-10: 0 mg via INTRAVENOUS

## 2020-06-09 MED ORDER — PHENYLEPHRINE 40 MCG/ML (10ML) SYRINGE FOR IV PUSH (FOR BLOOD PRESSURE SUPPORT)
PREFILLED_SYRINGE | INTRAVENOUS | Status: AC
  Filled 2020-06-09: qty 10

## 2020-06-09 MED ORDER — ACETAMINOPHEN 325 MG PO TABS: 650.0000 mg | ORAL_TABLET | ORAL | Status: DC | PRN

## 2020-06-09 MED ORDER — THROMBIN 20000 UNITS EX SOLR: CUTANEOUS | Status: DC | PRN

## 2020-06-09 SURGICAL SUPPLY — 88 items
ADH SKN CLS APL DERMABOND .7 (GAUZE/BANDAGES/DRESSINGS) ×1
APL SKNCLS STERI-STRIP NONHPOA (GAUZE/BANDAGES/DRESSINGS) ×1
BAND INSRT 18 STRL LF DISP RB (MISCELLANEOUS) ×2
BAND RUBBER #18 3X1/16 STRL (MISCELLANEOUS) ×6 IMPLANT
BASKET BONE COLLECTION (BASKET) ×3 IMPLANT
BENZOIN TINCTURE PRP APPL 2/3 (GAUZE/BANDAGES/DRESSINGS) ×4 IMPLANT
BIT DRILL PLIF MAS DISP 5.5MM (DRILL) IMPLANT
BLADE CLIPPER SURG (BLADE) IMPLANT
BONE CANC CHIPS 20CC PCAN1/4 (Bone Implant) ×3 IMPLANT
BONE CANC CHIPS 40CC CAN1/2 (Bone Implant) ×3 IMPLANT
BRUSH SCRUB EZ PLAIN DRY (MISCELLANEOUS) ×2 IMPLANT
BUR CARBIDE MATCH 3.0 (BURR) ×6 IMPLANT
CAGE COROENT LRG 8X9X28M SPINE (Cage) ×4 IMPLANT
CANISTER SUCT 3000ML PPV (MISCELLANEOUS) ×6 IMPLANT
CHIPS CANC BONE 20CC PCAN1/4 (Bone Implant) ×1 IMPLANT
CHIPS CANC BONE 40CC CAN1/2 (Bone Implant) ×1 IMPLANT
CLOSURE WOUND 1/2 X4 (GAUZE/BANDAGES/DRESSINGS) ×3
CNTNR URN SCR LID CUP LEK RST (MISCELLANEOUS) ×1 IMPLANT
CONT SPEC 4OZ STRL OR WHT (MISCELLANEOUS) ×3
COVER BACK TABLE 60X90IN (DRAPES) ×3 IMPLANT
COVER WAND RF STERILE (DRAPES) ×6 IMPLANT
DERMABOND ADVANCED (GAUZE/BANDAGES/DRESSINGS) ×2
DERMABOND ADVANCED .7 DNX12 (GAUZE/BANDAGES/DRESSINGS) ×1 IMPLANT
DIFFUSER DRILL AIR PNEUMATIC (MISCELLANEOUS) ×6 IMPLANT
DRAPE C-ARM 42X72 X-RAY (DRAPES) ×9 IMPLANT
DRAPE LAPAROTOMY 100X72X124 (DRAPES) ×4 IMPLANT
DRAPE MICROSCOPE LEICA (MISCELLANEOUS) ×3 IMPLANT
DRAPE SURG 17X23 STRL (DRAPES) ×4 IMPLANT
DRILL PLIF MAS DISP 5.5MM (DRILL) ×3
DRSG OPSITE POSTOP 4X8 (GAUZE/BANDAGES/DRESSINGS) ×2 IMPLANT
DURAPREP 26ML APPLICATOR (WOUND CARE) ×4 IMPLANT
ELECT REM PT RETURN 9FT ADLT (ELECTROSURGICAL) ×6
ELECTRODE REM PT RTRN 9FT ADLT (ELECTROSURGICAL) ×2 IMPLANT
EVACUATOR 1/8 PVC DRAIN (DRAIN) ×3 IMPLANT
GAUZE 4X4 16PLY RFD (DISPOSABLE) ×2 IMPLANT
GLOVE BIO SURGEON STRL SZ7 (GLOVE) IMPLANT
GLOVE BIO SURGEON STRL SZ8 (GLOVE) ×9 IMPLANT
GLOVE BIOGEL M 7.0 STRL (GLOVE) ×2 IMPLANT
GLOVE BIOGEL PI IND STRL 7.0 (GLOVE) IMPLANT
GLOVE BIOGEL PI INDICATOR 7.0 (GLOVE)
GLOVE SURG PR MICRO ENCORE 7.5 (GLOVE) ×4 IMPLANT
GLOVE SURG SS PI 7.5 STRL IVOR (GLOVE) ×8 IMPLANT
GLOVE SURG UNDER POLY LF SZ7.5 (GLOVE) ×2 IMPLANT
GOWN STRL REUS W/ TWL LRG LVL3 (GOWN DISPOSABLE) IMPLANT
GOWN STRL REUS W/ TWL XL LVL3 (GOWN DISPOSABLE) ×3 IMPLANT
GOWN STRL REUS W/TWL 2XL LVL3 (GOWN DISPOSABLE) IMPLANT
GOWN STRL REUS W/TWL LRG LVL3 (GOWN DISPOSABLE) ×3
GOWN STRL REUS W/TWL XL LVL3 (GOWN DISPOSABLE) ×12
GRAFT BNE CANC CHIPS 1-8 20CC (Bone Implant) IMPLANT
GRAFT BNE CHIP CANC 1-8 40 (Bone Implant) IMPLANT
GRAFT BONE PROTEIOS LRG 5CC (Orthopedic Implant) ×2 IMPLANT
GRAFT BONE PROTEIOS MED 2.5CC (Orthopedic Implant) ×4 IMPLANT
HEMOSTAT POWDER KIT SURGIFOAM (HEMOSTASIS) IMPLANT
KIT BASIN OR (CUSTOM PROCEDURE TRAY) ×6 IMPLANT
KIT BONE MRW ASP ANGEL CPRP (KITS) IMPLANT
KIT TURNOVER KIT B (KITS) ×6 IMPLANT
MILL MEDIUM DISP (BLADE) ×3 IMPLANT
NDL BLUNT 16X1.5 OR ONLY (NEEDLE) IMPLANT
NDL HYPO 25X1 1.5 SAFETY (NEEDLE) ×2 IMPLANT
NDL SPNL 20GX3.5 QUINCKE YW (NEEDLE) IMPLANT
NEEDLE BLUNT 16X1.5 OR ONLY (NEEDLE) ×3 IMPLANT
NEEDLE HYPO 25X1 1.5 SAFETY (NEEDLE) ×3 IMPLANT
NEEDLE SPNL 20GX3.5 QUINCKE YW (NEEDLE) IMPLANT
NS IRRIG 1000ML POUR BTL (IV SOLUTION) ×4 IMPLANT
PACK LAMINECTOMY NEURO (CUSTOM PROCEDURE TRAY) ×4 IMPLANT
PAD ARMBOARD 7.5X6 YLW CONV (MISCELLANEOUS) ×18 IMPLANT
ROD 100MM (Rod) ×6 IMPLANT
ROD SPNL 100XPREBNT NS MAS (Rod) IMPLANT
SCREW LOCK (Screw) ×24 IMPLANT
SCREW LOCK FXNS SPNE MAS PL (Screw) IMPLANT
SCREW SHANK 6.5X40 (Screw) ×6 IMPLANT
SCREW SHANK 6.5X40 NS LF (Screw) IMPLANT
SCREW SHANK RELINE MOD 7.5X45 (Screw) ×6 IMPLANT
SCREW SHANK RLINE MD 7.5X45 4S (Screw) IMPLANT
SCREW TULIP 5.5 (Screw) ×8 IMPLANT
SPONGE LAP 4X18 RFD (DISPOSABLE) IMPLANT
SPONGE SURGIFOAM ABS GEL 100 (HEMOSTASIS) ×3 IMPLANT
SPONGE SURGIFOAM ABS GEL SZ50 (HEMOSTASIS) IMPLANT
STRIP CLOSURE SKIN 1/2X4 (GAUZE/BANDAGES/DRESSINGS) ×6 IMPLANT
SUT VIC AB 0 CT1 18XCR BRD8 (SUTURE) ×2 IMPLANT
SUT VIC AB 0 CT1 8-18 (SUTURE) ×6
SUT VIC AB 2-0 CP2 18 (SUTURE) ×6 IMPLANT
SUT VIC AB 3-0 SH 8-18 (SUTURE) ×9 IMPLANT
SYR 5ML LL (SYRINGE) ×2 IMPLANT
TOWEL GREEN STERILE (TOWEL DISPOSABLE) ×6 IMPLANT
TOWEL GREEN STERILE FF (TOWEL DISPOSABLE) ×6 IMPLANT
TRAY FOLEY MTR SLVR 16FR STAT (SET/KITS/TRAYS/PACK) ×3 IMPLANT
WATER STERILE IRR 1000ML POUR (IV SOLUTION) ×4 IMPLANT

## 2020-06-09 NOTE — Progress Notes (Signed)
Pharmacy Antibiotic Note  [name] is a 60 y.o. male admitted on 06/09/2020 s/p spinal surgery.  Pt received Vancomycin 2gm IV pre-op for prophylaxis.  Pharmacy has been consulted to continue Vancomycin post-op for prophylaxis while drain in place.    Plan: Vancomycin 1000mg  IV every 12 hours.  Goal trough 10-15 mcg/mL.   Height: 5\' 11"  (180.3 cm) Weight: 121.1 kg (267 lb) IBW/kg (Calculated) : 75.3  Temp (24hrs), Avg:97.5 F (36.4 C), Min:96.8 F (36 C), Max:98.3 F (36.8 C)  Recent Labs  Lab 06/03/20 0843  WBC 12.8*  CREATININE 0.69    Estimated Creatinine Clearance: 133.3 mL/min (by C-G formula based on SCr of 0.69 mg/dL).    Allergies  Allergen Reactions  . Cefuroxime Axetil Hives    Antimicrobials this admission: Vancomycin 2/7 >>  Dose adjustments this admission:   Microbiology results:  Thank you for allowing pharmacy to be a part of this patient's care.  [employer], Pharm.D., BCPS Clinical Pharmacist  06/09/2020 6:57 PM

## 2020-06-09 NOTE — Op Note (Signed)
06/09/2020  4:45 PM  PATIENT:  [name]  60 y.o. male  PRE-OPERATIVE DIAGNOSIS:  1.  Adjacent level stenosis with degenerative disc disease and foraminal stenosis L5-S1 with a left L5 radiculopathy and partial foot drop,  2.  Adjacent level stenosis L1-2, 3.  Pseudoarthrosis L3-4 L4-5, 4.  Back and leg pain  POST-OPERATIVE DIAGNOSIS:  same  PROCEDURE:   1. Decompressive lumbar laminectomy hemifacetectomy and foraminotomies L5-S1 bilaterally requiring more work than would be required for a simple exposure of the disk for PLIF in order to adequately decompress the neural elements and address the spinal stenosis 2. Posterior lumbar interbody fusion L5-S1 using peek interbody cages packed with morcellized allograft and autograft  3. Posterior fixation L3-S1 inclusive using NuVasive cortical pedicle screws.  4. Intertransverse arthrodesis L3-4 L4-5 L5 S1 bilaterally using morcellized autograft and allograft. 5.  Exploration of fusion L3-4 L4-5 to confirm pseudoarthrosis with removal of segmental fixation L3-L5 bilaterally 6.  Decompressive laminectomy, medial facetectomy and foraminotomies L1-2, note that this level is completely separate from the PLIF level  SURGEON:  Sherley Bounds, MD  ASSISTANTS: Glenford Peers, FNP  ANESTHESIA:  General  EBL: 350 ml  Total I/O In: 2250 [I.V.:2000; IV Piggyback:250] Out: 75 [Urine:170; Blood:350]  BLOOD ADMINISTERED:none  DRAINS: Bilateral medium Hemovac  INDICATION FOR PROCEDURE: This patient presented with severe back and hip pain with a partial left foot drop and left leg pain. Imaging revealed adjacent level stenosis L1-2, adjacent level stenosis L5-S1 with severe foraminal stenosis on the left with compressing the left L5 nerve root, probable pseudoarthrosis L3-4 L4-5. The patient tried a reasonable attempt at conservative medical measures without relief. I recommended decompression and instrumented fusion to address the stenosis as well  as the segmental  instability.  Patient understood the risks, benefits, and alternatives and potential outcomes and wished to proceed.  PROCEDURE DETAILS:  The patient was brought to the operating room. After induction of generalized endotracheal anesthesia the patient was rolled into the prone position on chest rolls and all pressure points were padded. The patient's lumbar region was cleaned and then prepped with DuraPrep and draped in the usual sterile fashion. Anesthesia was injected and then a dorsal midline incision was made and carried down to the lumbosacral fascia. The fascia was opened and the paraspinous musculature was taken down in a subperiosteal fashion to expose L1-2, the previously placed instrumentation at L3-L5, and L5-S1 bilaterally. A self-retaining retractor was placed. Intraoperative fluoroscopy confirmed my level.  I remove the locking caps from the construct L3-L5 and remove the rods.  The L3 and L4 screws had good purchase but the L5 screws were somewhat loose in the pedicle.  These were removed.  Then explored the fusion L3-L5 and pulled on each screw and found these to move separately suggesting pseudoarthrosis.  I then turned my attention to the decompression and complete lumbar laminectomies, hemi- facetectomies, and foraminotomies were performed at L5-S1.  My nurse practitioner was directly involved in the decompression and exposure of the neural elements. the patient had significant spinal stenosis and this required more work than would be required for a simple exposure of the disc for posterior lumbar interbody fusion which would only require a limited laminotomy. Much more generous decompression and generous foraminotomy was undertaken in order to adequately decompress the neural elements and address the patient's leg pain. The yellow ligament was removed to expose the underlying dura and nerve roots, and generous foraminotomies were performed to adequately decompress the neural  elements. Both the exiting and traversing nerve roots were decompressed on both sides until a coronary dilator passed easily along the nerve roots. Once the decompression was complete, I turned my attention to the posterior lower lumbar interbody fusion. The epidural venous vasculature was coagulated and cut sharply. Disc space was incised and the initial discectomy was performed with pituitary rongeurs. The disc space was distracted with sequential distractors to a height of 8 mm. We then used a series of scrapers and shavers to prepare the endplates for fusion. The midline was prepared with Epstein curettes. Once the complete discectomy was finished, we packed an appropriate sized interbody cage with local autograft and morcellized allograft, gently retracted the nerve root, and tapped the cage into position at L5-S1.  The midline between the cages was packed with morselized autograft and allograft. We then turned our attention to the placement of the lower pedicle screws.  The old pedicle screw holes were used to place a 7.5 x 45 mm pedicle screws into L5.  At L5-S1, the pedicle screw entry zones were identified utilizing surface landmarks and fluoroscopy. I drilled into each pedicle utilizing the hand drill, and tapped each pedicle with the appropriate 5.5 tap. We palpated with a ball probe to assure no break in the cortex. We then placed X 6.5 x 45 mm pedicle screws into the pedicles bilaterally at S1.  My nurse practitioner assisted in placement of the pedicle screws.  We then decorticated the transverse processes and laid a mixture of morcellized autograft and allograft out over these to perform intertransverse arthrodesis at L3-S1 bilaterally. We then placed lordotic rods into the multiaxial screw heads of the pedicle screws from L3-S1 bilaterally and locked these in position with the locking caps and anti-torque device. We then checked our construct with AP and lateral fluoroscopy.  We then remove the  spinous process of L1 and used a high-speed drill and the Kerrison punches to perform a laminectomy, medial facetectomy foraminotomies at this level until we could palpate the pedicle of L2 and felt like the central canal was well decompressed.  Coronary dilator passed easily.  Irrigated with copious amounts of bacitracin-containing saline solution. Inspected the nerve roots once again to assure adequate decompression, use powdered Surgifoam to help control in the oozing in the epidural space, irrigated this away, lined to the dura with Gelfoam, placed bilateral medium Hemovac drains, and then we closed the muscle and the fascia with 0 Vicryl. Closed the subcutaneous tissues with 2-0 Vicryl and subcuticular tissues with 3-0 Vicryl. The skin was closed with benzoin and Steri-Strips. Dressing was then applied, the patient was awakened from general anesthesia and transported to the recovery room in stable condition. At the end of the procedure all sponge, needle and instrument counts were correct.   PLAN OF CARE: admit to inpatient  PATIENT DISPOSITION:  PACU - hemodynamically stable.   Delay start of Pharmacological VTE agent (>24hrs) due to surgical blood loss or risk of bleeding:  yes

## 2020-06-09 NOTE — Transfer of Care (Signed)
Immediate Anesthesia Transfer of Care Note  Patient: [name]  Procedure(s) Performed: Posterior Lumbar Interbody Fusion - Lumbar five -Sacral one, decompressive laminectomy Lumbar one- lumbar two, , posterolateral fusion Lumbar three -Sacral one (N/A Back) Decompressive laminectomy Lumbarone -two (N/A Back)  Patient Location: PACU  Anesthesia Type:General  Level of Consciousness: drowsy and patient cooperative  Airway & Oxygen Therapy: Patient Spontanous Breathing and Patient connected to nasal cannula oxygen  Post-op Assessment: Report given to RN, Post -op Vital signs reviewed and stable and Patient moving all extremities  Post vital signs: Reviewed and stable  Last Vitals:  Vitals Value Taken Time  BP    Temp    Pulse 107 06/09/20 1650  Resp 11 06/09/20 1650  SpO2 96 % 06/09/20 1650  Vitals shown include unvalidated device data.  Last Pain:  Vitals:   06/09/20 0927  TempSrc: Oral  PainSc: 0-No pain      Patients Stated Pain Goal: 3 (44/81/85 6314)  Complications: No complications documented.

## 2020-06-09 NOTE — H&P (Signed)
Subjective: Patient is a 60 y.o. male admitted for back and leg pain. Onset of symptoms was several months ago, gradually worsening since that time.  The pain is rated severe, and is located at the across the lower back and radiates to hips. The pain is described as aching and occurs all day. The symptoms have been progressive. Symptoms are exacerbated by exercise. MRI or CT showed spondylosis with stenosis   Past Medical History:  Diagnosis Date  . Allergy   . Anxiety   . Arthritis    Back  . Chronic back pain   . Colon polyps   . COPD (chronic obstructive pulmonary disease) ([place])   . Depression   . GERD (gastroesophageal reflux disease)    every now and then  . Headache   . Hyperlipidemia 09/26/2015  . Lumbar surgical wound fluid collection    lumbar wound dehiscence  . Pneumonia 2020    Past Surgical History:  Procedure Laterality Date  . BACK SURGERY    . COLONOSCOPY    . COLONOSCOPY W/ BIOPSIES AND POLYPECTOMY    . EYE SURGERY    . KNEE ARTHROSCOPY WITH MEDIAL MENISECTOMY Left 01/23/2016   Procedure: KNEE ARTHROSCOPY WITH MEDIAL MENISECTOMY;  Surgeon: Melrose Nakayama, MD;  Location: [place];  Service: Orthopedics;  Laterality: Left;  . LUMBAR WOUND DEBRIDEMENT N/A 03/21/2014   Procedure: LUMBAR WOUND DEBRIDEMENT;  Surgeon: Eustace Moore, MD;  Location: [place] [facility];  Service: Neurosurgery;  Laterality: N/A;  . MAXIMUM ACCESS (MAS)POSTERIOR LUMBAR INTERBODY FUSION (PLIF) 1 LEVEL N/A 02/14/2014   Procedure: FOR MAXIMUM ACCESS SURGERY POSTERIOR LUMBAR INTERBODY FUSION LUMBAR TWO-THREE;  Surgeon: Eustace Moore, MD;  Location: [place] [facility];  Service: Neurosurgery;  Laterality: N/A;  . NECK SURGERY    . SHOULDER SURGERY    . SPINE SURGERY    . TONSILLECTOMY      Prior to Admission medications   Medication Sig Start Date End Date Taking? Authorizing Provider  Ascorbic Acid (VITAMIN C) 1000 MG tablet Take 1,000 mg by mouth daily.   Yes [provider]   atorvastatin (LIPITOR) 10 MG tablet TAKE 1 TABLET BY MOUTH EVERY DAY Patient taking differently: Take 10 mg by mouth at bedtime. 03/21/20  Yes [name], MD  cholecalciferol (VITAMIN D3) 25 MCG (1000 UNIT) tablet Take 1,000 Units by mouth daily.   Yes [provider]  DULoxetine (CYMBALTA) 60 MG capsule Take 60 mg by mouth at bedtime. 11/05/14  Yes [provider]  gabapentin (NEURONTIN) 300 MG capsule Take 1 capsule (300 mg total) by mouth 3 (three) times daily. Patient taking differently: Take 300 mg by mouth 4 (four) times daily. 04/21/17  Yes Rosemarie Ax, MD  meloxicam (MOBIC) 15 MG tablet Take 1 tablet (15 mg total) by mouth daily. Patient taking differently: Take 15 mg by mouth at bedtime. 09/27/18  Yes [name], MD  Multiple Vitamin (MULTIVITAMIN WITH MINERALS) TABS tablet Take 1 tablet by mouth daily.   Yes [provider]  Oxycodone HCl 10 MG TABS Take 1-2 tablets (10-20 mg total) by mouth every 4 (four) hours as needed (pain). 09/11/19  Yes Eustace Moore, MD  tiZANidine (ZANAFLEX) 4 MG tablet Take 4 mg by mouth 3 (three) times daily.  11/05/14  Yes [provider]  vitamin B-12 (CYANOCOBALAMIN) 1000 MCG tablet Take 1,000 mcg by mouth daily.   Yes [provider]  albuterol (VENTOLIN HFA) 108 (90 Base) MCG/ACT inhaler Inhale 1-2  puffs into the lungs every 6 (six) hours as needed for wheezing or shortness of breath. 11/16/18   [name], MD   Allergies  Allergen Reactions  . Cefuroxime Axetil Hives    Social History   Tobacco Use  . Smoking status: Current Every Day Smoker    Packs/day: 0.50    Years: 44.00    Pack years: 22.00    Types: Cigarettes  . Smokeless tobacco: Current User    Types: Snuff  Substance Use Topics  . Alcohol use: Yes    Alcohol/week: 24.0 standard drinks    Types: 24 Standard drinks or equivalent per week    Comment: social beer    Family History  Problem Relation Age of Onset  . Colon  cancer Father 11       pt thinks father had colon cancer  . Stomach cancer Father   . Hypertension Mother   . Colon polyps Brother   . Aneurysm Maternal Aunt 58       brain  . Heart disease Maternal Aunt        d 34  . Stomach cancer Paternal Uncle 54  . Aneurysm Maternal Grandfather        stomach  . Colon cancer Maternal Grandfather        thinks grandfather had colon cancer  . Stomach cancer Paternal Grandfather 10  . Depression Brother 70  . Suicidality Brother   . Stomach cancer Paternal Uncle 70  . Esophageal cancer Neg Hx   . Rectal cancer Neg Hx   . Diabetes Neg Hx      Review of Systems  Positive ROS: neg  All other systems have been reviewed and were otherwise negative with the exception of those mentioned in the HPI and as above.  Objective: Vital signs in last 24 hours: Temp:  [98.3 F (36.8 C)] 98.3 F (36.8 C) (02/07 0927) Pulse Rate:  [92] 92 (02/07 0927) Resp:  [17] 17 (02/07 0927) BP: (145)/(85) 145/85 (02/07 0927) SpO2:  [96 %] 96 % (02/07 0927) Weight:  [121.1 kg] 121.1 kg (02/07 0927)  General Appearance: Alert, cooperative, no distress, appears stated age Head: Normocephalic, without obvious abnormality, atraumatic Eyes: PERRL, conjunctiva/corneas clear, EOM's intact    Neck: Supple, symmetrical, trachea midline Back: Symmetric, no curvature, ROM normal, no CVA tenderness Lungs:  respirations unlabored Heart: Regular rate and rhythm Abdomen: Soft, non-tender Extremities: Extremities normal, atraumatic, no cyanosis or edema Pulses: 2+ and symmetric all extremities Skin: Skin color, texture, turgor normal, no rashes or lesions  NEUROLOGIC:   Mental status: Alert and oriented x4,  no aphasia, good attention span, fund of knowledge, and memory Motor Exam - grossly normal except L DF 4/5 Sensory Exam - grossly normal Reflexes: 1+ Coordination - grossly normal Gait - grossly normal Balance - grossly normal Cranial Nerves: I: smell Not  tested  II: visual acuity  OS: nl    OD: nl  II: visual fields Full to confrontation  II: pupils Equal, round, reactive to light  III,VII: ptosis None  III,IV,VI: extraocular muscles  Full ROM  V: mastication Normal  V: facial light touch sensation  Normal  V,VII: corneal reflex  Present  VII: facial muscle function - upper  Normal  VII: facial muscle function - lower Normal  VIII: hearing Not tested  IX: soft palate elevation  Normal  IX,X: gag reflex Present  XI: trapezius strength  5/5  XI: sternocleidomastoid strength 5/5  XI: neck flexion strength  5/5  XII: tongue strength  Normal    Data Review Lab Results  Component Value Date   WBC 12.8 (H) 06/03/2020   HGB 15.3 06/03/2020   HCT 46.0 06/03/2020   MCV 94.7 06/03/2020   PLT 326 06/03/2020   Lab Results  Component Value Date   NA 138 06/03/2020   K 4.7 06/03/2020   CL 104 06/03/2020   CO2 25 06/03/2020   BUN 11 06/03/2020   CREATININE 0.69 06/03/2020   GLUCOSE 122 (H) 06/03/2020   Lab Results  Component Value Date   INR 0.9 06/03/2020    Assessment/Plan:  Estimated body mass index is 37.24 kg/m as calculated from the following:   Height as of this encounter: 5\' 11"  (1.803 m).   Weight as of this encounter: 121.1 kg. Patient admitted for PLIF L5-S1, posterolateral fusion L2-5, DLL L1-2. Patient has failed a reasonable attempt at conservative therapy.  I explained the condition and procedure to the patient and answered any questions.  Patient wishes to proceed with procedure as planned. Understands risks/ benefits and typical outcomes of procedure.   Eustace Moore 06/09/2020 11:59 AM

## 2020-06-09 NOTE — Anesthesia Procedure Notes (Signed)
Procedure Name: Intubation Date/Time: 06/09/2020 12:45 PM Performed by: Lavell Luster, CRNA Pre-anesthesia Checklist: Patient identified, Emergency Drugs available, Suction available, Patient being monitored and Timeout performed Patient Re-evaluated:Patient Re-evaluated prior to induction Oxygen Delivery Method: Circle system utilized Preoxygenation: Pre-oxygenation with 100% oxygen Induction Type: IV induction Ventilation: Mask ventilation without difficulty and Oral airway inserted - appropriate to patient size Laryngoscope Size: Mac, 4 and Glidescope Grade View: Grade I Tube type: Oral Tube size: 7.5 mm Number of attempts: 1 Airway Equipment and Method: Stylet and Video-laryngoscopy Placement Confirmation: ETT inserted through vocal cords under direct vision,  positive ETCO2 and breath sounds checked- equal and bilateral Secured at: 25 cm Tube secured with: Tape Dental Injury: Teeth and Oropharynx as per pre-operative assessment

## 2020-06-09 NOTE — Anesthesia Postprocedure Evaluation (Signed)
Anesthesia Post Note  Patient: [name]  Procedure(s) Performed: Posterior Lumbar Interbody Fusion - Lumbar five -Sacral one, decompressive laminectomy Lumbar one- lumbar two, , posterolateral fusion Lumbar three -Sacral one (N/A Back) Decompressive laminectomy Lumbarone -two (N/A Back)     Patient location during evaluation: PACU Anesthesia Type: General Level of consciousness: awake and alert and awake Pain management: pain level controlled Vital Signs Assessment: post-procedure vital signs reviewed and stable Respiratory status: spontaneous breathing, nonlabored ventilation, respiratory function stable and patient connected to nasal cannula oxygen Cardiovascular status: blood pressure returned to baseline and stable Postop Assessment: no apparent nausea or vomiting Anesthetic complications: no   No complications documented.  Last Vitals:  Vitals:   06/09/20 1820 06/09/20 1933  BP: (!) 151/98 (!) 167/95  Pulse: (!) 102 99  Resp: 18 18  Temp: 36.7 C 36.7 C  SpO2: 98% 99%    Last Pain:  Vitals:   06/09/20 1933  TempSrc: Oral  PainSc:                  Catalina Gravel

## 2020-06-09 NOTE — Anesthesia Preprocedure Evaluation (Addendum)
Anesthesia Evaluation  Patient identified by MRN, date of birth, ID band Patient awake    Reviewed: Allergy & Precautions, NPO status , Patient's Chart, lab work & pertinent test results  Airway Mallampati: III  TM Distance: >3 FB Neck ROM: Full    Dental  (+) Teeth Intact, Dental Advisory Given   Pulmonary COPD,  COPD inhaler, Current SmokerPatient did not abstain from smoking.,    Pulmonary exam normal breath sounds clear to auscultation       Cardiovascular negative cardio ROS Normal cardiovascular exam Rhythm:Regular Rate:Normal     Neuro/Psych  Headaches, PSYCHIATRIC DISORDERS Anxiety Depression  Neuromuscular disease    GI/Hepatic Neg liver ROS, GERD  ,  Endo/Other  Obesity   Renal/GU negative Renal ROS     Musculoskeletal  (+) Arthritis , narcotic dependent Pseudoarthrosis   Abdominal   Peds  Hematology negative hematology ROS (+)   Anesthesia Other Findings Day of surgery medications reviewed with the patient.  Reproductive/Obstetrics                            Anesthesia Physical Anesthesia Plan  ASA: II  Anesthesia Plan: General   Post-op Pain Management:    Induction: Intravenous  PONV Risk Score and Plan: 2 and Midazolam, Dexamethasone and Ondansetron  Airway Management Planned: Oral ETT  Additional Equipment:   Intra-op Plan:   Post-operative Plan: Extubation in OR  Informed Consent: I have reviewed the patients History and Physical, chart, labs and discussed the procedure including the risks, benefits and alternatives for the proposed anesthesia with the patient or authorized representative who has indicated his/her understanding and acceptance.     Dental advisory given  Plan Discussed with: CRNA  Anesthesia Plan Comments:         Anesthesia Quick Evaluation

## 2020-06-10 MED ORDER — OXYCODONE HCL 10 MG PO TABS
10.0000 mg | ORAL_TABLET | ORAL | 0 refills | Status: DC | PRN
Start: 2020-06-10 — End: 2023-05-20

## 2020-06-10 NOTE — Evaluation (Signed)
Occupational Therapy Evaluation Patient Details Name: [name] MRN: [id number] DOB: April 23, 1961 Today's Date: 06/10/2020    History of Present Illness Pt is a 60 y/o male who presents s/p L5-S1 PLIF and L1-L2 laminectomy/decompression on 06/09/2020. PMH significant for COPD, prior back surgery with lumbar wound debridement ~1 month later, shoulder surgery, cervical surgery.   Clinical Impression   Patient evaluated by Occupational Therapy with no further acute OT needs identified. Pt demonstrated ability to complete full body dressing, functional mobility and toilet transfer at modified independent level. Pt demonstrated good adherence to back precautions. All education has been completed and the patient has no further questions. See below for any follow-up Occupational Therapy or equipment needs. OT to sign off. Thank you for referral.      Follow Up Recommendations  No OT follow up    Equipment Recommendations  None recommended by OT    Recommendations for Other Services       Precautions / Restrictions Precautions Precautions: Fall;Back Precaution Booklet Issued: Yes (comment) Precaution Comments: Reviewed handout and pt was cued for precautions during functional mobility. Restrictions Weight Bearing Restrictions: No      Mobility Bed Mobility               General bed mobility comments: pt received sitting EOB upon OT arrival    Transfers Overall transfer level: Modified independent Equipment used: None Transfers: Sit to/from Stand Sit to Stand: Modified independent (Device/Increase time)         General transfer comment: No assist required. Pt was able to power-up to full stand without difficulty and demonstrated good hand placement on seated surface for safety.    Balance                                           ADL either performed or assessed with clinical judgement   ADL Overall ADL's : Modified independent                                        General ADL Comments: demonstrated ability to complete ADL at modified indepndent level with use of AE. Pt with plans to purchase equipment at later time.Pt completed full body dressing, functional mobility and toilet transfer.     Vision         Perception     Praxis      Pertinent Vitals/Pain Pain Assessment: 0-10 Pain Score: 5  Pain Location: Back Pain Descriptors / Indicators: Operative site guarding;Sore Pain Intervention(s): Limited activity within patient's tolerance;Monitored during session;Repositioned     Hand Dominance Right   Extremity/Trunk Assessment Upper Extremity Assessment Upper Extremity Assessment: Overall WFL for tasks assessed   Lower Extremity Assessment Lower Extremity Assessment: Defer to PT evaluation   Cervical / Trunk Assessment Cervical / Trunk Assessment: Other exceptions Cervical / Trunk Exceptions: s/p surgery   Communication Communication Communication: No difficulties   Cognition Arousal/Alertness: Awake/alert Behavior During Therapy: WFL for tasks assessed/performed Overall Cognitive Status: Within Functional Limits for tasks assessed                                     General Comments  vss    Exercises     Shoulder  Instructions      Home Living Family/patient expects to be discharged to:: Private residence Living Arrangements: Spouse/significant other;Other relatives Available Help at Discharge: Family;Available 24 hours/day Type of Home: House Home Access: Stairs to enter [employer] of Steps: 5 Entrance Stairs-Rails: Right;Left Home Layout: One level     Bathroom Shower/Tub: Occupational psychologist: Standard Bathroom Accessibility: No   Home Equipment: Cane - single point;Bedside commode;Walker - 2 wheels;Crutches          Prior Functioning/Environment Level of Independence: Independent                 OT Problem List:  Decreased activity tolerance;Decreased knowledge of precautions;Pain      OT Treatment/Interventions:      OT Goals(Current goals can be found in the care plan section) Acute Rehab OT Goals Patient Stated Goal: to go home OT Goal Formulation: With patient Time For Goal Achievement: 06/17/20 Potential to Achieve Goals: Good  OT Frequency:     Barriers to D/C:            Co-evaluation              AM-PAC OT "6 Clicks" Daily Activity     Outcome Measure Help from another person eating meals?: None Help from another person taking care of personal grooming?: None Help from another person toileting, which includes using toliet, bedpan, or urinal?: None Help from another person bathing (including washing, rinsing, drying)?: None Help from another person to put on and taking off regular upper body clothing?: None Help from another person to put on and taking off regular lower body clothing?: None 6 Click Score: 24   End of Session Equipment Utilized During Treatment: Back brace Nurse Communication: Mobility status  Activity Tolerance: Patient tolerated treatment well Patient left: in chair;with call bell/phone within reach  OT Visit Diagnosis: Other abnormalities of gait and mobility (R26.89);Pain Pain - part of body:  (back)                Time: 7673-4193 OT Time Calculation (min): 23 min Charges:  OT General Charges $OT Visit: 1 Visit OT Evaluation $OT Eval Low Complexity: 1 Low OT Treatments $Self Care/Home Management : 8-22 mins  Helene Kelp OTR/L Acute Rehabilitation Services Office: [facility] 06/10/2020, 11:40 AM

## 2020-06-10 NOTE — Progress Notes (Signed)
Pt requested for his PCA to be discontinued this morning. He stated it did not give him much relief through the night and opted to use the oral pain meds instead. Remaining Dilaudid 27 ml from the syringe was was wasted and witnessed by another Therapist, sports.

## 2020-06-10 NOTE — Discharge Summary (Signed)
Physician Discharge Summary  Patient ID: [name] MRN: [id number] DOB/AGE: 59-Aug-1962 59 y.o.  Admit date: 06/09/2020 Discharge date: 06/10/2020  Admission Diagnoses: L5 radiculopathy, DDD, pseudoarthrosis L3-5, stenosis L1-2    Discharge Diagnoses: same   Discharged Condition: good  Hospital Course: The patient was admitted on 06/09/2020 and taken to the operating room where the patient underwent PLIF L5-S1, re-do fusion L3-5, LL L1-2. The patient tolerated the procedure well and was taken to the recovery room and then to the floor in stable condition. The hospital course was routine. There were no complications. The wound remained clean dry and intact. Pt had appropriate baxck soreness. No complaints of leg pain or new N/T/W. The patient remained afebrile with stable vital signs, and tolerated a regular diet. The patient continued to increase activities, and pain was well controlled with oral pain medications.   Consults: None  Significant Diagnostic Studies:  Results for orders placed or performed during the hospital encounter of 06/06/20  SARS CORONAVIRUS 2 (TAT 6-24 HRS) Nasopharyngeal Nasopharyngeal Swab   Specimen: Nasopharyngeal Swab  Result Value Ref Range   SARS Coronavirus 2 NEGATIVE NEGATIVE    Chest 2 View  Result Date: 06/03/2020 CLINICAL DATA:  Back pain, preoperative exam EXAM: CHEST - 2 VIEW COMPARISON:  03/17/2020 FINDINGS: Lungs are well expanded, symmetric, and clear. No pneumothorax or pleural effusion. Cardiac size within normal limits. Pulmonary vascularity is normal. Cervical fusion hardware noted. Healed right eighth rib fracture again noted. Osseous structures are age-appropriate. No acute bone abnormality. IMPRESSION: No active cardiopulmonary disease. Electronically Signed   By: Fidela Salisbury MD   On: 06/03/2020 14:44   DG Lumbar Spine 2-3 Views  Result Date: 06/09/2020 CLINICAL DATA:  L5-S1 posterior lumbar fusion, intraoperative examination EXAM: DG  C-ARM 1-60 MIN; LUMBAR SPINE - 2-3 VIEW CONTRAST:  None FLUOROSCOPY TIME:  Fluoroscopy Time:  57 seconds Radiation Exposure Index (if provided by the fluoroscopic device): Not provided Number of Acquired Spot Images: 3 COMPARISON:  12/25/2019 FINDINGS: Three fluoroscopic intraoperative radiographs demonstrate the lumbosacral junction from L4-S1 on lateral examination. Bilateral pedicle screws and bars are seen extending from L3-S1 on lateral examination. Radiopaque spherical densities are seen at L3-4, L4-5, and L5-S1 in keeping with interbody bone cages. Soft tissue retractors are seen overlying the posterior soft tissues with a large lucent defects overlying the operative area in keeping with a operative exposure. No unexpected fracture or listhesis on this limited examination. IMPRESSION: Intraoperative radiographs as described above. Electronically Signed   By: Fidela Salisbury MD   On: 06/09/2020 16:06   DG C-Arm 1-60 Min  Result Date: 06/09/2020 CLINICAL DATA:  L5-S1 posterior lumbar fusion, intraoperative examination EXAM: DG C-ARM 1-60 MIN; LUMBAR SPINE - 2-3 VIEW CONTRAST:  None FLUOROSCOPY TIME:  Fluoroscopy Time:  57 seconds Radiation Exposure Index (if provided by the fluoroscopic device): Not provided Number of Acquired Spot Images: 3 COMPARISON:  12/25/2019 FINDINGS: Three fluoroscopic intraoperative radiographs demonstrate the lumbosacral junction from L4-S1 on lateral examination. Bilateral pedicle screws and bars are seen extending from L3-S1 on lateral examination. Radiopaque spherical densities are seen at L3-4, L4-5, and L5-S1 in keeping with interbody bone cages. Soft tissue retractors are seen overlying the posterior soft tissues with a large lucent defects overlying the operative area in keeping with a operative exposure. No unexpected fracture or listhesis on this limited examination. IMPRESSION: Intraoperative radiographs as described above. Electronically Signed   By: Fidela Salisbury MD    On: 06/09/2020 16:06  Antibiotics:  Anti-infectives (From admission, onward)   Start     Dose/Rate Route Frequency Ordered Stop   06/09/20 2200  vancomycin (VANCOCIN) IVPB 1000 mg/200 mL premix        1,000 mg 200 mL/hr over 60 Minutes Intravenous Every 12 hours 06/09/20 1858     06/09/20 0915  vancomycin (VANCOCIN) IVPB 1000 mg/200 mL premix        1,000 mg 200 mL/hr over 60 Minutes Intravenous On call to O.R. 06/09/20 0907 06/09/20 1850      Discharge Exam: Blood pressure (!) 141/79, pulse (!) 104, temperature 98.7 F (37.1 C), temperature source Oral, resp. rate 18, height 5\' 11"  (1.803 m), weight 121.1 kg, SpO2 95 %. Neurologic: Grossly normal Dressing dry  Discharge Medications:   Allergies as of 06/10/2020      Reactions   Cefuroxime Axetil Hives      Medication List    TAKE these medications   albuterol 108 (90 Base) MCG/ACT inhaler Commonly known as: VENTOLIN HFA Inhale 1-2 puffs into the lungs every 6 (six) hours as needed for wheezing or shortness of breath.   atorvastatin 10 MG tablet Commonly known as: LIPITOR TAKE 1 TABLET BY MOUTH EVERY DAY What changed: when to take this   cholecalciferol 25 MCG (1000 UNIT) tablet Commonly known as: VITAMIN D3 Take 1,000 Units by mouth daily.   DULoxetine 60 MG capsule Commonly known as: CYMBALTA Take 60 mg by mouth at bedtime.   gabapentin 300 MG capsule Commonly known as: NEURONTIN Take 1 capsule (300 mg total) by mouth 3 (three) times daily. What changed: when to take this   meloxicam 15 MG tablet Commonly known as: MOBIC Take 1 tablet (15 mg total) by mouth daily. What changed: when to take this   multivitamin with minerals Tabs tablet Take 1 tablet by mouth daily.   Oxycodone HCl 10 MG Tabs Take 1-2 tablets (10-20 mg total) by mouth every 4 (four) hours as needed (pain).   tiZANidine 4 MG tablet Commonly known as: ZANAFLEX Take 4 mg by mouth 3 (three) times daily.   vitamin B-12 1000 MCG  tablet Commonly known as: CYANOCOBALAMIN Take 1,000 mcg by mouth daily.   vitamin C 1000 MG tablet Take 1,000 mg by mouth daily.            Durable Medical Equipment  (From admission, onward)         Start     Ordered   06/09/20 1815  DME Walker rolling  Once       Question:  Patient needs a walker to treat with the following condition  Answer:  S/P lumbar fusion   06/09/20 1814   06/09/20 1815  DME 3 n 1  Once        06/09/20 1814          Disposition: home   Final Dx: PLIF L5-S1, re-do fusion L3-5, LL L1-2  Discharge Instructions     Remove dressing in 72 hours   Complete by: As directed    Call MD for:  difficulty breathing, headache or visual disturbances   Complete by: As directed    Call MD for:  persistant nausea and vomiting   Complete by: As directed    Call MD for:  redness, tenderness, or signs of infection (pain, swelling, redness, odor or green/yellow discharge around incision site)   Complete by: As directed    Call MD for:  severe uncontrolled pain   Complete by: As directed  Call MD for:  temperature >100.4   Complete by: As directed    Diet - low sodium heart healthy   Complete by: As directed    Increase activity slowly   Complete by: As directed        Follow-up Information    Eustace Moore, MD. Schedule an appointment as soon as possible for a visit in 2 week(s).   Specialty: Neurosurgery Contact information: [address] [place] [address] [place] [address] [phone]                Signed: Eustace Moore 06/10/2020, 7:52 AM

## 2020-06-10 NOTE — Progress Notes (Signed)
Physical Therapy Evaluation and Discharge  Assessment: Patient evaluated by Physical Therapy with no further acute PT needs identified. All education has been completed and the patient has no further questions. Pt was able to demonstrate transfers and ambulation with gross modified independence and no AD. Pt was educated on precautions, brace application/wearing schedule, appropriate activity progression, and car transfer. See below for any follow-up Physical Therapy or equipment needs. PT is signing off. Thank you for this referral.    06/10/20 1117  PT Visit Information  Last PT Received On 06/10/20  Assistance Needed +1  History of Present Illness Pt is a 60 y/o male who presents s/p L5-S1 PLIF and L1-L2 laminectomy/decompression on 06/09/2020. PMH significant for COPD, prior back surgery with lumbar wound debridement ~1 month later, shoulder surgery, cervical surgery.  Precautions  Precautions Fall;Cervical  Precaution Booklet Issued Yes (comment)  Precaution Comments Reviewed handout and pt was cued for precautions during functional mobility.  Restrictions  Weight Bearing Restrictions No  Home Living  Family/patient expects to be discharged to: Private residence  Living Arrangements Spouse/significant other;Other relatives  Available Help at Discharge Family;Available 24 hours/day  Type of Home House  Home Access Stairs to enter  Entrance Stairs-Number of Steps 5  Entrance Stairs-Rails Right;Left  Home Layout One level  Bathroom Shower/Tub Walk-in Cytogeneticist No  Home Equipment Cane - single point;BSC;Walker - 2 wheels;Crutches  Prior Function  Level of Independence Independent  Communication  Communication No difficulties  Pain Assessment  Pain Assessment 0-10  Pain Score 4  Pain Location Back  Pain Descriptors / Indicators Operative site guarding;Sore  Pain Intervention(s) Limited activity within patient's tolerance;Monitored  during session;Repositioned  Cognition  Arousal/Alertness Awake/alert  Behavior During Therapy WFL for tasks assessed/performed  Overall Cognitive Status Within Functional Limits for tasks assessed  Upper Extremity Assessment  Upper Extremity Assessment Defer to OT evaluation  Lower Extremity Assessment  Lower Extremity Assessment Generalized weakness (Consistent with pre-op diagnosis)  Cervical / Trunk Assessment  Cervical / Trunk Assessment Other exceptions  Cervical / Trunk Exceptions s/p surgery  Bed Mobility  General bed mobility comments Pt was received sitting up in the chair.  Transfers  Overall transfer level Needs assistance  Equipment used None  Transfers Sit to/from Stand  Sit to Stand Modified independent (Device/Increase time)  General transfer comment No assist required. Pt was able to power-up to full stand without difficulty and demonstrated good hand placement on seated surface for safety.  Ambulation/Gait  Ambulation/Gait assistance Modified independent (Device/Increase time)  Gait Distance (Feet) 300 Feet  Assistive device None  Gait Pattern/deviations Step-through pattern;Decreased stride length;Trunk flexed  General Gait Details VC's for imrpoved posture and general safety. No overt LOB noted. Distance somewhat limited by fatigue but pt denies SOB.  Gait velocity Decreased  Gait velocity interpretation 1.31 - 2.62 ft/sec, indicative of limited community ambulator  Stairs Yes  Stairs assistance Supervision  Stair Management One rail Right;Step to pattern;Forwards  Number of Stairs 10  General stair comments VC's for sequencing and general safety  Balance  Overall balance assessment Mild deficits observed, not formally tested  PT - End of Session  Equipment Utilized During Treatment Gait belt;Back brace  Activity Tolerance Patient tolerated treatment well  Patient left Other (comment) (In room with OT)  Nurse Communication Mobility status  PT Assessment   PT Recommendation/Assessment Patent does not need any further PT services  PT Visit Diagnosis Unsteadiness on feet (R26.81);Pain  Pain - part  of body  (back)  No Skilled PT All education completed;Patient is modified independent with all activity/mobility  AM-PAC PT "6 Clicks" Mobility Outcome Measure (Version 2)  Help needed turning from your back to your side while in a flat bed without using bedrails? 4  Help needed moving from lying on your back to sitting on the side of a flat bed without using bedrails? 4  Help needed moving to and from a bed to a chair (including a wheelchair)? 4  Help needed standing up from a chair using your arms (e.g., wheelchair or bedside chair)? 4  Help needed to walk in hospital room? 4  Help needed climbing 3-5 steps with a railing?  3  6 Click Score 23  Consider Recommendation of Discharge To: Home with no services  PT Recommendation  Follow Up Recommendations No PT follow up;Supervision - Intermittent  PT equipment None recommended by PT  Acute Rehab PT Goals  Patient Stated Goal home today  PT Goal Formulation All assessment and education complete, DC therapy  PT Time Calculation  PT Start Time (ACUTE ONLY) 0934  PT Stop Time (ACUTE ONLY) 0946  PT Time Calculation (min) (ACUTE ONLY) 12 min  PT General Charges  $$ ACUTE PT VISIT 1 Visit  PT Evaluation  $PT Eval Low Complexity 1 Low  Written Expression  Dominant Hand Right    Rolinda Roan, PT, DPT Acute Rehabilitation Services Pager: [phone] Office: [phone]

## 2020-06-10 NOTE — Plan of Care (Signed)
Pt given D/C instructions with verbal understanding. Rx's were sent to the pharmacy by MD. Pt's incision is clean and dry with no sign of infection. Pt's IV was removed prior to D/C. Pt D/C'd home via wheelchair per MD order. Pt is stable @ D/C and has no other needs at this time. Rykar Lebleu, RN  

## 2020-06-11 ENCOUNTER — Encounter (HOSPITAL_COMMUNITY): Payer: Self-pay | Admitting: Neurological Surgery

## 2020-06-16 ENCOUNTER — Other Ambulatory Visit: Payer: Medicare Other | Admitting: *Deleted

## 2020-06-16 NOTE — Patient Outreach (Signed)
[place] [facility]) Care Management  06/16/2020  Baraka Klatt Mealey [birth year] [phone]      EMMI-GENERAL DISCHARGE-UNSUCCESSFUL RED ON EMMI ALERT Day #4 Date:06/15/2020 Red Alert Reason: QUESTIONS/PROBLEMS  OUTREACH #1 RN attempted outreach call however unsuccessful. RN able to leave a HIPAA approved voice message requesting a call back.  Will send outreach letter and follow up over the next week.  Raina Mina, RN Care Management Coordinator [place] Office [phone]

## 2020-06-17 ENCOUNTER — Telehealth: Payer: Self-pay

## 2020-06-17 ENCOUNTER — Ambulatory Visit: Payer: Medicare Other

## 2020-06-17 NOTE — Telephone Encounter (Signed)
Spoke with patient; had to reschedule AWV due to recent surgery.

## 2020-06-19 ENCOUNTER — Other Ambulatory Visit: Payer: Self-pay | Admitting: *Deleted

## 2020-06-19 NOTE — Patient Outreach (Signed)
[place] [facility]) Care Management  06/19/2020  Brain Honeycutt Stender [birth year] [phone]   EMMI- GENERAL DISCHARGE-UNSUCCESSFUL RED ON EMMI ALERT Day #4  Date:  06/15/2020 Red Alert Reason: QUESTIONS/PROBLEMS  OUTREACH#2 RN attempted outreach call today to inquire on the above emmi however unsuccessful. RN able to leave a HIPAA approved voice message requesting a call back  Will attempted another outreach call over the next week.  Raina Mina, RN Care Management Coordinator [place] Office [phone]

## 2020-06-20 ENCOUNTER — Other Ambulatory Visit: Payer: Self-pay | Admitting: Internal Medicine

## 2020-06-20 ENCOUNTER — Other Ambulatory Visit: Payer: Self-pay | Admitting: *Deleted

## 2020-06-20 NOTE — Telephone Encounter (Signed)
Please refill as per office routine med refill policy (all routine meds refilled for 3 mo or monthly per pt preference up to one year from last visit, then month to month grace period for 3 mo, then further med refills will have to be denied)  

## 2020-06-20 NOTE — Patient Outreach (Signed)
[facility]) Care Management  06/20/2020  Ferman Basilio Brennen [birth year] [phone]   EMMI-GENERAL ASSESSMENT-RESOLVED RED ON EMMI ALERT Day #4 Date: 06/15/2020 Red Alert Reason: QUESTIONS/PROBLEMS  OUTREACH #3 RN received a call from pt on yesterday. RN returned the call today and inquired further on the above emmi. Pt states all his questions and concerns have been answered and resolved. No additional inquires or needs at this time.  Case will be closed as pt states he has no other needs for this RN case manager to address at this time.   Raina Mina, RN Care Management Coordinator [place] Office [phone]

## 2020-06-25 ENCOUNTER — Ambulatory Visit: Payer: Self-pay | Admitting: *Deleted

## 2020-06-27 ENCOUNTER — Other Ambulatory Visit: Payer: Self-pay

## 2020-06-27 ENCOUNTER — Ambulatory Visit: Payer: Medicare Other

## 2020-07-02 DIAGNOSIS — S32009K Unspecified fracture of unspecified lumbar vertebra, subsequent encounter for fracture with nonunion: Secondary | ICD-10-CM | POA: Diagnosis not present

## 2020-07-09 DIAGNOSIS — M5416 Radiculopathy, lumbar region: Secondary | ICD-10-CM | POA: Diagnosis not present

## 2020-07-09 DIAGNOSIS — M5126 Other intervertebral disc displacement, lumbar region: Secondary | ICD-10-CM | POA: Diagnosis not present

## 2020-07-10 ENCOUNTER — Ambulatory Visit: Payer: Medicare Other

## 2020-07-22 DIAGNOSIS — S32009K Unspecified fracture of unspecified lumbar vertebra, subsequent encounter for fracture with nonunion: Secondary | ICD-10-CM | POA: Diagnosis not present

## 2020-07-24 ENCOUNTER — Other Ambulatory Visit: Payer: Self-pay

## 2020-07-24 ENCOUNTER — Ambulatory Visit (INDEPENDENT_AMBULATORY_CARE_PROVIDER_SITE_OTHER): Payer: Medicare Other

## 2020-07-24 DIAGNOSIS — Z23 Encounter for immunization: Secondary | ICD-10-CM | POA: Diagnosis not present

## 2020-07-24 DIAGNOSIS — Z0000 Encounter for general adult medical examination without abnormal findings: Secondary | ICD-10-CM

## 2020-07-24 NOTE — Patient Instructions (Signed)
[name] , Thank you for taking time to come for your Medicare Wellness Visit. I appreciate your ongoing commitment to your health goals. Please review the following plan we discussed and let me know if I can assist you in the future.   Screening recommendations/referrals: Colonoscopy: 09/16/2016; due every 3 years Recommended yearly ophthalmology/optometry visit for glaucoma screening and checkup Recommended yearly dental visit for hygiene and checkup  Vaccinations: Influenza vaccine: 02/21/2020 Pneumococcal vaccine: 07/24/2020, need Prevnar 13 at age 69 Tdap vaccine: 01/23/2019; due every 10 years Shingles vaccine: never done; can check with local pharmacy   Covid-19: 07/20/2019, 08/20/2019, 04/12/2020  Advanced directives: Please bring a copy of your health care power of attorney and living will to the office at your convenience.  Conditions/risks identified: Yes; Reviewed health maintenance screenings with patient today and relevant education, vaccines, and/or referrals were provided. Please continue to do your personal lifestyle choices by: daily care of teeth and gums, regular physical activity (goal should be 5 days a week for 30 minutes), eat a healthy diet, avoid tobacco and drug use, limiting any alcohol intake, taking a low-dose aspirin (if not allergic or have been advised by your provider otherwise) and taking vitamins and minerals as recommended by your provider. Continue doing brain stimulating activities (puzzles, reading, adult coloring books, staying active) to keep memory sharp. Continue to eat heart healthy diet (full of fruits, vegetables, whole grains, lean protein, water--limit salt, fat, and sugar intake) and increase physical activity as tolerated.  Next appointment: Please schedule your next Medicare Wellness Visit with your Nurse Health Advisor in 1 year by calling [phone].  Preventive Care 40-64 Years, Male Preventive care refers to lifestyle choices and visits  with your health care provider that can promote health and wellness. What does preventive care include?  A yearly physical exam. This is also called an annual well check.  Dental exams once or twice a year.  Routine eye exams. Ask your health care provider how often you should have your eyes checked.  Personal lifestyle choices, including:  Daily care of your teeth and gums.  Regular physical activity.  Eating a healthy diet.  Avoiding tobacco and drug use.  Limiting alcohol use.  Practicing safe sex.  Taking low-dose aspirin every day starting at age 16. What happens during an annual well check? The services and screenings done by your health care provider during your annual well check will depend on your age, overall health, lifestyle risk factors, and family history of disease. Counseling  Your health care provider may ask you questions about your:  Alcohol use.  Tobacco use.  Drug use.  Emotional well-being.  Home and relationship well-being.  Sexual activity.  Eating habits.  Work and work Statistician. Screening  You may have the following tests or measurements:  Height, weight, and BMI.  Blood pressure.  Lipid and cholesterol levels. These may be checked every 5 years, or more frequently if you are over 1 years old.  Skin check.  Lung cancer screening. You may have this screening every year starting at age 12 if you have a 30-pack-year history of smoking and currently smoke or have quit within the past 15 years.  Fecal occult blood test (FOBT) of the stool. You may have this test every year starting at age 67.  Flexible sigmoidoscopy or colonoscopy. You may have a sigmoidoscopy every 5 years or a colonoscopy every 10 years starting at age 35.  Prostate cancer screening. Recommendations will vary depending on your family  history and other risks.  Hepatitis C blood test.  Hepatitis B blood test.  Sexually transmitted disease (STD)  testing.  Diabetes screening. This is done by checking your blood sugar (glucose) after you have not eaten for a while (fasting). You may have this done every 1-3 years. Discuss your test results, treatment options, and if necessary, the need for more tests with your health care provider. Vaccines  Your health care provider may recommend certain vaccines, such as:  Influenza vaccine. This is recommended every year.  Tetanus, diphtheria, and acellular pertussis (Tdap, Td) vaccine. You may need a Td booster every 10 years.  Zoster vaccine. You may need this after age 27.  Pneumococcal 13-valent conjugate (PCV13) vaccine. You may need this if you have certain conditions and have not been vaccinated.  Pneumococcal polysaccharide (PPSV23) vaccine. You may need one or two doses if you smoke cigarettes or if you have certain conditions. Talk to your health care provider about which screenings and vaccines you need and how often you need them. This information is not intended to replace advice given to you by your health care provider. Make sure you discuss any questions you have with your health care provider. Document Released: 05/16/2015 Document Revised: 01/07/2016 Document Reviewed: 02/18/2015 Elsevier Interactive Patient Education  2017 [place] Prevention in the Home Falls can cause injuries. They can happen to people of all ages. There are many things you can do to make your home safe and to help prevent falls. What can I do on the outside of my home?  Regularly fix the edges of walkways and driveways and fix any cracks.  Remove anything that might make you trip as you walk through a door, such as a raised step or threshold.  Trim any bushes or trees on the path to your home.  Use bright outdoor lighting.  Clear any walking paths of anything that might make someone trip, such as rocks or tools.  Regularly check to see if handrails are loose or broken. Make sure that  both sides of any steps have handrails.  Any raised decks and porches should have guardrails on the edges.  Have any leaves, snow, or ice cleared regularly.  Use sand or salt on walking paths during winter.  Clean up any spills in your garage right away. This includes oil or grease spills. What can I do in the bathroom?  Use night lights.  Install grab bars by the toilet and in the tub and shower. Do not use towel bars as grab bars.  Use non-skid mats or decals in the tub or shower.  If you need to sit down in the shower, use a plastic, non-slip stool.  Keep the floor dry. Clean up any water that spills on the floor as soon as it happens.  Remove soap buildup in the tub or shower regularly.  Attach bath mats securely with double-sided non-slip rug tape.  Do not have throw rugs and other things on the floor that can make you trip. What can I do in the bedroom?  Use night lights.  Make sure that you have a light by your bed that is easy to reach.  Do not use any sheets or blankets that are too big for your bed. They should not hang down onto the floor.  Have a firm chair that has side arms. You can use this for support while you get dressed.  Do not have throw rugs and other things on the  floor that can make you trip. What can I do in the kitchen?  Clean up any spills right away.  Avoid walking on wet floors.  Keep items that you use a lot in easy-to-reach places.  If you need to reach something above you, use a strong step stool that has a grab bar.  Keep electrical cords out of the way.  Do not use floor polish or wax that makes floors slippery. If you must use wax, use non-skid floor wax.  Do not have throw rugs and other things on the floor that can make you trip. What can I do with my stairs?  Do not leave any items on the stairs.  Make sure that there are handrails on both sides of the stairs and use them. Fix handrails that are broken or loose. Make sure  that handrails are as long as the stairways.  Check any carpeting to make sure that it is firmly attached to the stairs. Fix any carpet that is loose or worn.  Avoid having throw rugs at the top or bottom of the stairs. If you do have throw rugs, attach them to the floor with carpet tape.  Make sure that you have a light switch at the top of the stairs and the bottom of the stairs. If you do not have them, ask someone to add them for you. What else can I do to help prevent falls?  Wear shoes that:  Do not have high heels.  Have rubber bottoms.  Are comfortable and fit you well.  Are closed at the toe. Do not wear sandals.  If you use a stepladder:  Make sure that it is fully opened. Do not climb a closed stepladder.  Make sure that both sides of the stepladder are locked into place.  Ask someone to hold it for you, if possible.  Clearly mark and make sure that you can see:  Any grab bars or handrails.  First and last steps.  Where the edge of each step is.  Use tools that help you move around (mobility aids) if they are needed. These include:  Canes.  Walkers.  Scooters.  Crutches.  Turn on the lights when you go into a dark area. Replace any light bulbs as soon as they burn out.  Set up your furniture so you have a clear path. Avoid moving your furniture around.  If any of your floors are uneven, fix them.  If there are any pets around you, be aware of where they are.  Review your medicines with your doctor. Some medicines can make you feel dizzy. This can increase your chance of falling. Ask your doctor what other things that you can do to help prevent falls. This information is not intended to replace advice given to you by your health care provider. Make sure you discuss any questions you have with your health care provider. Document Released: 02/13/2009 Document Revised: 09/25/2015 Document Reviewed: 05/24/2014 Elsevier Interactive Patient Education   2017 [employer].

## 2020-07-24 NOTE — Progress Notes (Signed)
Subjective:   [name] is a 60 y.o. male who presents for Medicare Annual/Subsequent preventive examination.  Review of Systems    No ROS. Medicare Wellness Visit. Additional risk factors are reflected in social history. Cardiac Risk Factors include: advanced age (>88men, >71 women);dyslipidemia;family history of premature cardiovascular disease;male gender;obesity (BMI >30kg/m2);smoking/ tobacco exposure Sleep Patterns: No sleep issues, feels rested on waking and sleeps 5-6 hours nightly. Home Safety/Smoke Alarms: Feels safe in home; uses home alarm. Smoke alarms in place. Living environment: 1-story home; Lives with spouse; uses a cane for DME; good support system. Seat Belt Safety/Bike Helmet: Wears seat belt.    Objective:    Today's Vitals   07/24/20 0907 07/24/20 0939  BP: (!) 150/80   Pulse: (!) 106   Temp: 98.2 F (36.8 C)   SpO2: 97%   Weight: 269 lb (122 kg)   Height: 5\' 11"  (1.803 m)   PainSc:  4    Body mass index is 37.52 kg/m.  Advanced Directives 07/24/2020 06/09/2020 06/03/2020 03/17/2020 02/06/2020 09/10/2019 09/06/2019  Does Patient Have a Medical Advance Directive? Yes Yes Yes No No Yes Yes  Type of Advance Directive Living will;Healthcare Power of Attorney Living will;Healthcare Power of Attorney Living will;Healthcare Power of Attorney - - Living will;Healthcare Power of Attorney Living will;Healthcare Power of Attorney  Does patient want to make changes to medical advance directive? No - Patient declined No - Patient declined No - Patient declined - - No - Patient declined -  Copy of [place] in Chart? No - copy requested No - copy requested No - copy requested - - No - copy requested No - copy requested  Would patient like information on creating a medical advance directive? - - - - No - Patient declined - -    Current Medications (verified) Outpatient Encounter Medications as of 07/24/2020  Medication Sig  . albuterol (VENTOLIN  HFA) 108 (90 Base) MCG/ACT inhaler Inhale 1-2 puffs into the lungs every 6 (six) hours as needed for wheezing or shortness of breath.  . Ascorbic Acid (VITAMIN C) 1000 MG tablet Take 1,000 mg by mouth daily.  Marland Kitchen atorvastatin (LIPITOR) 10 MG tablet TAKE 1 TABLET BY MOUTH EVERY DAY  . cholecalciferol (VITAMIN D3) 25 MCG (1000 UNIT) tablet Take 1,000 Units by mouth daily.  . DULoxetine (CYMBALTA) 60 MG capsule Take 60 mg by mouth at bedtime.  . gabapentin (NEURONTIN) 300 MG capsule Take 1 capsule (300 mg total) by mouth 3 (three) times daily. (Patient taking differently: Take 300 mg by mouth 4 (four) times daily.)  . meloxicam (MOBIC) 15 MG tablet Take 1 tablet (15 mg total) by mouth daily. (Patient not taking: Reported on 07/24/2020)  . Multiple Vitamin (MULTIVITAMIN WITH MINERALS) TABS tablet Take 1 tablet by mouth daily.  . Oxycodone HCl 10 MG TABS Take 1-2 tablets (10-20 mg total) by mouth every 4 (four) hours as needed (pain).  Marland Kitchen tiZANidine (ZANAFLEX) 4 MG tablet Take 4 mg by mouth 3 (three) times daily.   . vitamin B-12 (CYANOCOBALAMIN) 1000 MCG tablet Take 1,000 mcg by mouth daily.   No facility-administered encounter medications on file as of 07/24/2020.    Allergies (verified) Cefuroxime axetil   History: Past Medical History:  Diagnosis Date  . Allergy   . Anxiety   . Arthritis    Back  . Chronic back pain   . Colon polyps   . COPD (chronic obstructive pulmonary disease) ([place])   . Depression   .  GERD (gastroesophageal reflux disease)    every now and then  . Headache   . Hyperlipidemia 09/26/2015  . Lumbar surgical wound fluid collection    lumbar wound dehiscence  . Pneumonia 2020   Past Surgical History:  Procedure Laterality Date  . BACK SURGERY    . COLONOSCOPY    . COLONOSCOPY W/ BIOPSIES AND POLYPECTOMY    . EYE SURGERY    . KNEE ARTHROSCOPY WITH MEDIAL MENISECTOMY Left 01/23/2016   Procedure: KNEE ARTHROSCOPY WITH MEDIAL MENISECTOMY;  Surgeon: Melrose Nakayama, MD;   Location: [facility];  Service: Orthopedics;  Laterality: Left;  . LUMBAR LAMINECTOMY/DECOMPRESSION MICRODISCECTOMY N/A 06/09/2020   Procedure: Decompressive laminectomy Lumbarone -two;  Surgeon: Eustace Moore, MD;  Location: [facility];  Service: Neurosurgery;  Laterality: N/A;  . LUMBAR WOUND DEBRIDEMENT N/A 03/21/2014   Procedure: LUMBAR WOUND DEBRIDEMENT;  Surgeon: Eustace Moore, MD;  Location: [facility];  Service: Neurosurgery;  Laterality: N/A;  . MAXIMUM ACCESS (MAS)POSTERIOR LUMBAR INTERBODY FUSION (PLIF) 1 LEVEL N/A 02/14/2014   Procedure: FOR MAXIMUM ACCESS SURGERY POSTERIOR LUMBAR INTERBODY FUSION LUMBAR TWO-THREE;  Surgeon: Eustace Moore, MD;  Location: [facility];  Service: Neurosurgery;  Laterality: N/A;  . NECK SURGERY    . SHOULDER SURGERY    . SPINE SURGERY    . TONSILLECTOMY     Family History  Problem Relation Age of Onset  . Colon cancer Father 60       pt thinks father had colon cancer  . Stomach cancer Father   . Hypertension Mother   . Colon polyps Brother   . Aneurysm Maternal Aunt 58       brain  . Heart disease Maternal Aunt        d 38  . Stomach cancer Paternal Uncle 47  . Aneurysm Maternal Grandfather        stomach  . Colon cancer Maternal Grandfather        thinks grandfather had colon cancer  . Stomach cancer Paternal Grandfather 60  . Depression Brother 52  . Suicidality Brother   . Stomach cancer Paternal Uncle 34  . Esophageal cancer Neg Hx   . Rectal cancer Neg Hx   . Diabetes Neg Hx    Social History   Socioeconomic History  . Marital status: Married    Spouse name: cindy  . Number of children: Not on file  . Years of education: Not on file  . Highest education level: Not on file  Occupational History  . Occupation: produce manager/ disability    Employer: [employer]  Tobacco Use  . Smoking status: Current Every Day Smoker    Packs/day: 0.50    Years: 44.00    Pack years: 22.00    Types: Cigarettes  . Smokeless  tobacco: Current User    Types: Snuff  Vaping Use  . Vaping Use: Never used  Substance and Sexual Activity  . Alcohol use: Yes    Alcohol/week: 24.0 standard drinks    Types: 24 Standard drinks or equivalent per week    Comment: social beer  . Drug use: No  . Sexual activity: Yes    Partners: Female  Other Topics Concern  . Not on file  Social History Narrative   Exercise--no   Social Determinants of Health   Financial Resource Strain: Low Risk   . Difficulty of Paying Living Expenses: Not hard at all  Food Insecurity: No Food Insecurity  . Worried About Running  Out of Food in the Last Year: Never true  . Ran Out of Food in the Last Year: Never true  Transportation Needs: No Transportation Needs  . Lack of Transportation (Medical): No  . Lack of Transportation (Non-Medical): No  Physical Activity: Inactive  . Days of Exercise per Week: 0 days  . Minutes of Exercise per Session: 0 min  Stress: No Stress Concern Present  . Feeling of Stress : Not at all  Social Connections: Not on file    Tobacco Counseling Ready to quit: Not Answered Counseling given: Not Answered   Clinical Intake:  Pre-visit preparation completed: Yes  Pain : 0-10 Pain Score: 4  Pain Type: Chronic pain Pain Location: Back Pain Radiating Towards: n/a Pain Descriptors / Indicators: Discomfort,Throbbing,Aching Pain Onset: More than a month ago Pain Frequency: Constant Pain Relieving Factors: Hydromorphone Effect of Pain on Daily Activities: Pain can diminish job performance, lower motivation to exercise, and prevent you from completing daily tasks. Pain produces disability and affects the quality of life.  Pain Relieving Factors: Hydromorphone  BMI - recorded: 37.52 Nutritional Status: BMI > 30  Obese Nutritional Risks: None Diabetes: No  How often do you need to have someone help you when you read instructions, pamphlets, or other written materials from your doctor or pharmacy?: 1 -  Never What is the last grade level you completed in school?: 8th grade  Diabetic? no  Interpreter Needed?: No  Information entered by :: Lisette Abu, LPN   Activities of Daily Living In your present state of health, do you have any difficulty performing the following activities: 07/24/2020 06/09/2020  Hearing? N N  Vision? N N  Difficulty concentrating or making decisions? N N  Walking or climbing stairs? Tempie Donning  Comment uses a cane -  Dressing or bathing? N Y  Doing errands, shopping? N N  Preparing Food and eating ? N -  Using the Toilet? N -  In the past six months, have you accidently leaked urine? N -  Do you have problems with loss of bowel control? N -  Managing your Medications? N -  Managing your Finances? N -  Housekeeping or managing your Housekeeping? N -  Some recent data might be hidden    Patient Care Team: [name], MD as PCP - General (Internal Medicine) Eustace Moore, MD as Consulting Physician (Neurosurgery) Illene Labrador, MD as Referring Physician (Neurosurgery)  Indicate any recent Medical Services you may have received from other than [facility] providers in the past year (date may be approximate).     Assessment:   This is a routine wellness examination for [place].  Hearing/Vision screen No exam data present  Dietary issues and exercise activities discussed: Current Exercise Habits: The patient does not participate in regular exercise at present, Exercise limited by: orthopedic condition(s)  Goals    . Patient Stated     Continue to travel with my brother and take family beach trips. Go fishing and take day road trips. Continue to work in my yard and enjoy life.    . Patient Stated     I would like to lose 50 pounds this year.      Depression Screen PHQ 2/9 Scores 07/24/2020 02/21/2020 02/21/2020 09/26/2019 09/27/2018 09/27/2018 07/25/2017  PHQ - 2 Score 0 0 0 1 1 1 1   PHQ- 9 Score - - 0 - - - 3    Fall Risk Fall Risk  07/24/2020  02/21/2020 02/21/2020 09/27/2018 09/27/2018  Falls  in the past year? 1 0 1 1 1   Number falls in past yr: 1 - 1 0 0  Comment - - - - -  Injury with Fall? 0 - 1 0 -  Risk for fall due to : Impaired balance/gait;Orthopedic patient - History of fall(s) Impaired mobility -  Follow up Falls evaluation completed - Falls evaluation completed Falls prevention discussed -    FALL RISK PREVENTION PERTAINING TO THE HOME:  Any stairs in or around the home? No  If so, are there any without handrails? No  Home free of loose throw rugs in walkways, pet beds, electrical cords, etc? Yes  Adequate lighting in your home to reduce risk of falls? Yes   ASSISTIVE DEVICES UTILIZED TO PREVENT FALLS:  Life alert? No  Use of a cane, walker or w/c? Yes  Grab bars in the bathroom? No  Shower chair or bench in shower? No  Elevated toilet seat or a handicapped toilet? No   TIMED UP AND GO:  Was the test performed? No .  Length of time to ambulate 10 feet: 0 sec.   Gait steady and fast with assistive device  Cognitive Function: Normal cognitive status assessed by direct observation by this Nurse Health Advisor. No abnormalities found.          Immunizations Immunization History  Administered Date(s) Administered  . Influenza Split 03/07/2012  . Influenza,inj,Quad PF,6+ Mos 01/13/2017, 01/23/2019, 02/21/2020  . Influenza-Unspecified 05/03/2014, 01/31/2018  . Moderna Sars-Covid-2 Vaccination 07/20/2019, 08/20/2019, 04/12/2020  . Pneumococcal Polysaccharide-23 05/03/2008  . Tdap 05/03/2008, 05/23/2018, 01/23/2019    TDAP status: Up to date  Flu Vaccine status: Up to date  Pneumococcal vaccine status: Up to date  Covid-19 vaccine status: Completed vaccines  Qualifies for Shingles Vaccine? Yes   Zostavax completed No   Shingrix Completed?: No.    Education has been provided regarding the importance of this vaccine. Patient has been advised to call insurance company to determine out of pocket  expense if they have not yet received this vaccine. Advised may also receive vaccine at local pharmacy or Health Dept. Verbalized acceptance and understanding.  Screening Tests Health Maintenance  Topic Date Due  . COLONOSCOPY (Pts 45-50yrs Insurance coverage will need to be confirmed)  09/17/2019  . TETANUS/TDAP  01/22/2029  . INFLUENZA VACCINE  Completed  . COVID-19 Vaccine  Completed  . Hepatitis C Screening  Completed  . HIV Screening  Completed  . HPV VACCINES  Aged Out    Health Maintenance  Health Maintenance Due  Topic Date Due  . COLONOSCOPY (Pts 45-5yrs Insurance coverage will need to be confirmed)  09/17/2019    Colorectal cancer screening: Type of screening: Colonoscopy. Completed 09/16/2016. Repeat every 3 years  Lung Cancer Screening: (Low Dose CT Chest recommended if Age 33-80 years, 30 pack-year currently smoking OR have quit w/in 15years.) does qualify.   Lung Cancer Screening Referral: no  Additional Screening:  Hepatitis C Screening: does qualify; Completed yes  Vision Screening: Recommended annual ophthalmology exams for early detection of glaucoma and other disorders of the eye. Is the patient up to date with their annual eye exam?  Yes  Who is the provider or what is the name of the office in which the patient attends annual eye exams? [facility] If pt is not established with a provider, would they like to be referred to a provider to establish care? No .   Dental Screening: Recommended annual dental exams for proper oral hygiene  Community Resource Referral / Chronic Care Management: CRR required this visit?  No   CCM required this visit?  No      Plan:     I have personally reviewed and noted the following in the patient's chart:   . Medical and social history . Use of alcohol, tobacco or illicit drugs  . Current medications and supplements . Functional ability and status . Nutritional status . Physical activity . Advanced  directives . List of other physicians . Hospitalizations, surgeries, and ER visits in previous 12 months . Vitals . Screenings to include cognitive, depression, and falls . Referrals and appointments  In addition, I have reviewed and discussed with patient certain preventive protocols, quality metrics, and best practice recommendations. A written personalized care plan for preventive services as well as general preventive health recommendations were provided to patient.     Sheral Flow, LPN   8/33/3832   Nurse Notes:  Medications reviewed with patient; yes opioid use noted.

## 2020-08-19 ENCOUNTER — Other Ambulatory Visit: Payer: Self-pay | Admitting: Internal Medicine

## 2020-08-20 NOTE — Progress Notes (Signed)
    [name] is a 60 y.o. male who presents to [place] at [facility] today for B foot/toe pain.  He was last seen by Dr. Tamala Julian on 02/26/20 for B knee pain.  Since then, pt reports B foot and toe pain since June 2021.  Pt had a level back fusion 7-8 weeks ago. He locates his pain to toes 1-3 that radiates into midfoot. Pt reports every time he goes to the beach he experiences swelling in both feet.  Foot/toe swelling: yes Aggravating symptoms: walking Treatments tried: none  Diagnostic imaging: R and L foot XR- 11/28/19  Pertinent review of systems: No fevers or chills  Relevant historical information: History Planter fasciitis   Exam:  BP (!) 148/89 (BP Location: Right Arm, Patient Position: Sitting, Cuff Size: Normal)   Pulse (!) 103   Ht 5\' 11"  (1.803 m)   Wt 275 lb 3.2 oz (124.8 kg)   SpO2 98%   BMI 38.38 kg/m  General: Well Developed, well nourished, and in no acute distress.   MSK: Right foot pronation and pes planus present. First MTP hallux rigidus with decreased motion.  Nontender. Foot motion otherwise normal. Pulses cap refill and sensation are intact distally.  Left foot pronation and pes planus present. First MTP with decreased motion and hallux rigidus.  Nontender. Foot motion otherwise normal. Pulses capillary refill and sensation are intact distally.    Lab and Radiology Results  X-ray images bilateral feet obtained today personally and independently interpreted  Right foot: Mild degenerative changes.  No fractures.  Plantar osteophyte calcaneus.  Left foot: Mild degenerative changes.  No fractures.  Plantar osteophyte calcaneus.  Await formal radiology review    Assessment and Plan: 60 y.o. male with bilateral foot and toe pain.  Patient has pes planus and hallux rigidus.  I believe his gait is altered because of the toe pain and hallux rigidus.  Discussed options.  Plan for improved arch support in his shoes with  better insoles.  Additionally recommend turf toe steel insole to take some the pressure off of his first MTP as he walks.  In addition recommend Voltaren gel.  Reassess in 1 month.  If not better consider injection.   PDMP not reviewed this encounter. Orders Placed This Encounter  Procedures  . DG Foot Complete Left    Standing Status:   Future    Number of Occurrences:   1    Standing Expiration Date:   08/21/2021    Order Specific Question:   Reason for Exam (SYMPTOM  OR DIAGNOSIS REQUIRED)    Answer:   bilateral foot pain    Order Specific Question:   Preferred imaging location?    Answer:   [facility]  . DG Foot Complete Right    Standing Status:   Future    Number of Occurrences:   1    Standing Expiration Date:   08/21/2021    Order Specific Question:   Reason for Exam (SYMPTOM  OR DIAGNOSIS REQUIRED)    Answer:   bilateral foot pain    Order Specific Question:   Preferred imaging location?    Answer:   [facility]   No orders of the defined types were placed in this encounter.    Discussed warning signs or symptoms. Please see discharge instructions. Patient expresses understanding.   The above documentation has been reviewed and is accurate and complete Lynne Leader, M.D.

## 2020-08-21 ENCOUNTER — Ambulatory Visit: Payer: Medicare Other | Admitting: Family Medicine

## 2020-08-21 ENCOUNTER — Ambulatory Visit (INDEPENDENT_AMBULATORY_CARE_PROVIDER_SITE_OTHER): Payer: Medicare Other

## 2020-08-21 ENCOUNTER — Other Ambulatory Visit: Payer: Self-pay

## 2020-08-21 VITALS — BP 148/89 | HR 103 | Ht 71.0 in | Wt 275.2 lb

## 2020-08-21 DIAGNOSIS — M2021 Hallux rigidus, right foot: Secondary | ICD-10-CM | POA: Diagnosis not present

## 2020-08-21 DIAGNOSIS — M79671 Pain in right foot: Secondary | ICD-10-CM

## 2020-08-21 DIAGNOSIS — M7731 Calcaneal spur, right foot: Secondary | ICD-10-CM | POA: Diagnosis not present

## 2020-08-21 DIAGNOSIS — M79672 Pain in left foot: Secondary | ICD-10-CM

## 2020-08-21 DIAGNOSIS — M7732 Calcaneal spur, left foot: Secondary | ICD-10-CM | POA: Diagnosis not present

## 2020-08-21 DIAGNOSIS — M2022 Hallux rigidus, left foot: Secondary | ICD-10-CM | POA: Diagnosis not present

## 2020-08-21 NOTE — Patient Instructions (Addendum)
Thank you for coming in today.  Please use voltaren gel up to 4x daily for pain as needed.   Get over the counter insoles with good arch support   Get a Steel Turf Toe insole.  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   Please perform the exercise program that we have prepared for you and gone over in detail on a daily basis.  In addition to the handout you were provided you can access your program through: [email or web address]-[email or web address]   Your unique program code is: [email or web address]   Please get an Xray today before you leave

## 2020-08-22 NOTE — Progress Notes (Signed)
Left foot x-ray also shows some arthritis.

## 2020-08-22 NOTE — Progress Notes (Signed)
Right foot x-ray shows some arthritis.

## 2020-08-28 ENCOUNTER — Encounter: Payer: Self-pay | Admitting: Gastroenterology

## 2020-09-19 ENCOUNTER — Other Ambulatory Visit: Payer: Self-pay | Admitting: Internal Medicine

## 2020-09-19 NOTE — Telephone Encounter (Signed)
Please refill as per office routine med refill policy (all routine meds refilled for 3 mo or monthly per pt preference up to one year from last visit, then month to month grace period for 3 mo, then further med refills will have to be denied)  

## 2020-09-22 ENCOUNTER — Ambulatory Visit
Admission: EM | Admit: 2020-09-22 | Discharge: 2020-09-22 | Discharge status: Against Medical Advice | Payer: Medicare Other

## 2020-09-22 ENCOUNTER — Other Ambulatory Visit: Payer: Self-pay

## 2020-09-22 NOTE — ED Triage Notes (Signed)
Spoke with pt, states he left an hour and a half ago d/t long wait times.

## 2020-09-23 ENCOUNTER — Ambulatory Visit: Payer: Medicare Other | Admitting: Family Medicine

## 2020-09-23 ENCOUNTER — Telehealth (INDEPENDENT_AMBULATORY_CARE_PROVIDER_SITE_OTHER): Payer: Medicare Other | Admitting: Family Medicine

## 2020-09-23 DIAGNOSIS — M7061 Trochanteric bursitis, right hip: Secondary | ICD-10-CM | POA: Insufficient documentation

## 2020-09-23 DIAGNOSIS — R519 Headache, unspecified: Secondary | ICD-10-CM

## 2020-09-23 DIAGNOSIS — M5416 Radiculopathy, lumbar region: Secondary | ICD-10-CM | POA: Diagnosis not present

## 2020-09-23 DIAGNOSIS — R0981 Nasal congestion: Secondary | ICD-10-CM

## 2020-09-23 DIAGNOSIS — M7062 Trochanteric bursitis, left hip: Secondary | ICD-10-CM | POA: Insufficient documentation

## 2020-09-23 MED ORDER — DOXYCYCLINE HYCLATE 100 MG PO TABS: 100.0000 mg | ORAL_TABLET | Freq: Two times a day (BID) | ORAL | 0 refills | Status: DC

## 2020-09-23 MED ORDER — BENZONATATE 100 MG PO CAPS: 100.0000 mg | ORAL_CAPSULE | Freq: Two times a day (BID) | ORAL | 0 refills | Status: DC | PRN

## 2020-09-23 NOTE — Patient Instructions (Signed)
-  I sent the medication(s) we discussed to your pharmacy: Meds ordered this encounter  Medications  . doxycycline (VIBRA-TABS) 100 MG tablet    Sig: Take 1 tablet (100 mg total) by mouth 2 (two) times daily.    Dispense:  20 tablet    Refill:  0  . benzonatate (TESSALON) 100 MG capsule    Sig: Take 1 capsule (100 mg total) by mouth 2 (two) times daily as needed for cough.    Dispense:  20 capsule    Refill:  0     I hope you are feeling better soon!  Seek in person care promptly if your symptoms worsen, new concerns arise or you are not improving with treatment.  It was nice to meet you today. I help [facility] out with telemedicine visits on Tuesdays and Thursdays and am available for visits on those days. If you have any concerns or questions following this visit please schedule a follow up visit with your Primary Care doctor or seek care at a local urgent care clinic to avoid delays in care.

## 2020-09-23 NOTE — Progress Notes (Signed)
Virtual Visit via Telephone Note  I connected with [name] on 09/23/20 at  5:00 PM EDT by telephone and verified that I am speaking with the correct person using two identifiers.   I discussed the limitations, risks, security and privacy concerns of performing an evaluation and management service by telephone and the availability of in person appointments. I also discussed with the patient that there may be a patient responsible charge related to this service. The patient expressed understanding and agreed to proceed.  Location patient: home, [place] Location provider: work or home office Participants present for the call: patient, provider Patient did not have a visit with me in the prior 7 days to address this/these issue(s).   History of Present Illness:  Acute telemedicine visit for sinus issues and ear pain: -Onset: a few weeks ago -Symptoms include: L ear has felt full on and off, has nasal congestion chronically but now worse the last week with thick discolored drainage L with L sinus maxillary pain, teeth hurt, cough, had low grade temp a few days ago -reports pt did covid test a few days ago which was negative -Denies: CP, SOB, NVD, body aches, inability to eat/drink/get out of bed -Has tried: musinex -denies any abx recently -grandson had croup recently -Pertinent past medical history:hx of seasonal allergies and sinusitis per his report -Pertinent medication allergies: Allergies  Allergen Reactions  . Cefuroxime Axetil Hives   -COVID-19 vaccine status: fully vaccinated and boosted   Observations/Objective: Patient sounds cheerful and well on the phone. I do not appreciate any SOB. Speech and thought processing are grossly intact. Patient reported vitals:  Assessment and Plan:  Sinus congestion  Facial discomfort  -we discussed possible serious and likely etiologies, options for evaluation and workup, limitations of telemedicine visit vs in person visit,  treatment, treatment risks and precautions. Pt prefers to treat via telemedicine empirically rather than in person at this moment.  Query sinusitis, VURI vs other. He opted for treatment with nasal saline, tessalon for cough and doxy if not improving.  Also discussed increasing levels of covid int the community. He had a negative test and multiple family members tested negative as well recently.  Scheduled follow up with PCP offered: as needed Advise prompt in person care if worsening, new symptoms arise, or if is not improving with treatment. Advised of options for inperson care in case PCP office not available. Did let the patient know that I only do telemedicine shifts for [facility] on Tuesdays and Thursdays and advised a follow up visit with PCP or at an [facility] if has further questions or concerns.   Follow Up Instructions:  I did not refer this patient for an OV with me in the next 24 hours for this/these issue(s).  I discussed the assessment and treatment plan with the patient. The patient was provided an opportunity to ask questions and all were answered. The patient agreed with the plan and demonstrated an understanding of the instructions.   I spent 14 minutes on the date of this visit in the care of this patient. See summary of tasks completed to properly care for this patient in the detailed notes above which also included counseling of above, review of PMH, medications, allergies, evaluation of the patient and ordering and/or  instructing patient on testing and care options.     Lucretia Kern, DO

## 2020-10-01 NOTE — Progress Notes (Signed)
[name] [address] [place] Phone: [phone] Subjective:   This visit occurred during the SARS-CoV-2 public health emergency.  Safety protocols were in place, including screening questions prior to the visit, additional usage of staff PPE, and extensive cleaning of exam room while observing appropriate contact time as indicated for disinfecting solutions.   I'm seeing this patient by the request  of:  [name], MD  CC: Bilateral foot pain  PXT:[id number]  [name] is a 60 y.o. male coming in with complaint of foot pain.  Patient has been diagnosed with fibromatosis of the plantar fascia with injections in 2020.  Patient did see another provider 6 weeks ago and was diagnosed with pes planus with hallux rigidus.  Patient was to get different shoes and start with topical medications.  Patient states that he has been having pain over metatarsal heads for past year. First 3 toes and metatarsal heads are the most painful. Just got a steel shank for both shoes. Pain is worse with walking in less supportive shoe.    Patient has undergone posterior lumbar interbody fusion of the lumbar spine in February 2022  Past Medical History:  Diagnosis Date  . Allergy   . Anxiety   . Arthritis    Back  . Chronic back pain   . Colon polyps   . COPD (chronic obstructive pulmonary disease) ([place])   . Depression   . GERD (gastroesophageal reflux disease)    every now and then  . Headache   . Hyperlipidemia 09/26/2015  . Lumbar surgical wound fluid collection    lumbar wound dehiscence  . Pneumonia 2020   Past Surgical History:  Procedure Laterality Date  . BACK SURGERY    . COLONOSCOPY    . COLONOSCOPY W/ BIOPSIES AND POLYPECTOMY    . EYE SURGERY    . KNEE ARTHROSCOPY WITH MEDIAL MENISECTOMY Left 01/23/2016   Procedure: KNEE ARTHROSCOPY WITH MEDIAL MENISECTOMY;  Surgeon: Melrose Nakayama, MD;  Location: [place];   Service: Orthopedics;  Laterality: Left;  . LUMBAR LAMINECTOMY/DECOMPRESSION MICRODISCECTOMY N/A 06/09/2020   Procedure: Decompressive laminectomy Lumbarone -two;  Surgeon: Eustace Moore, MD;  Location: [facility];  Service: Neurosurgery;  Laterality: N/A;  . LUMBAR WOUND DEBRIDEMENT N/A 03/21/2014   Procedure: LUMBAR WOUND DEBRIDEMENT;  Surgeon: Eustace Moore, MD;  Location: [facility];  Service: Neurosurgery;  Laterality: N/A;  . MAXIMUM ACCESS (MAS)POSTERIOR LUMBAR INTERBODY FUSION (PLIF) 1 LEVEL N/A 02/14/2014   Procedure: FOR MAXIMUM ACCESS SURGERY POSTERIOR LUMBAR INTERBODY FUSION LUMBAR TWO-THREE;  Surgeon: Eustace Moore, MD;  Location: [place] [facility];  Service: Neurosurgery;  Laterality: N/A;  . NECK SURGERY    . SHOULDER SURGERY    . SPINE SURGERY    . TONSILLECTOMY     Social History   Socioeconomic History  . Marital status: Married    Spouse name: cindy  . Number of children: Not on file  . Years of education: Not on file  . Highest education level: Not on file  Occupational History  . Occupation: produce manager/ disability    Employer: [employer]  Tobacco Use  . Smoking status: Current Every Day Smoker    Packs/day: 0.50    Years: 44.00    Pack years: 22.00    Types: Cigarettes  . Smokeless tobacco: Current User    Types: Snuff  Vaping Use  . Vaping Use: Never used  Substance and Sexual Activity  .  Alcohol use: Yes    Alcohol/week: 24.0 standard drinks    Types: 24 Standard drinks or equivalent per week    Comment: social beer  . Drug use: No  . Sexual activity: Yes    Partners: Female  Other Topics Concern  . Not on file  Social History Narrative   Exercise--no   Social Determinants of Health   Financial Resource Strain: Low Risk   . Difficulty of Paying Living Expenses: Not hard at all  Food Insecurity: No Food Insecurity  . Worried About Charity fundraiser in the Last Year: Never true  . Ran Out of Food in the Last Year: Never true  Transportation  Needs: No Transportation Needs  . Lack of Transportation (Medical): No  . Lack of Transportation (Non-Medical): No  Physical Activity: Inactive  . Days of Exercise per Week: 0 days  . Minutes of Exercise per Session: 0 min  Stress: No Stress Concern Present  . Feeling of Stress : Not at all  Social Connections: Not on file   Allergies  Allergen Reactions  . Cefuroxime Axetil Hives   Family History  Problem Relation Age of Onset  . Colon cancer Father 1       pt thinks father had colon cancer  . Stomach cancer Father   . Hypertension Mother   . Colon polyps Brother   . Aneurysm Maternal Aunt 58       brain  . Heart disease Maternal Aunt        d 10  . Stomach cancer Paternal Uncle 53  . Aneurysm Maternal Grandfather        stomach  . Colon cancer Maternal Grandfather        thinks grandfather had colon cancer  . Stomach cancer Paternal Grandfather 4  . Depression Brother 23  . Suicidality Brother   . Stomach cancer Paternal Uncle 37  . Esophageal cancer Neg Hx   . Rectal cancer Neg Hx   . Diabetes Neg Hx      Current Outpatient Medications (Cardiovascular):  .  atorvastatin (LIPITOR) 10 MG tablet, TAKE 1 TABLET BY MOUTH EVERY DAY  Current Outpatient Medications (Respiratory):  .  albuterol (VENTOLIN HFA) 108 (90 Base) MCG/ACT inhaler, INHALE 1-2 PUFFS INTO THE LUNGS EVERY 6 (SIX) HOURS AS NEEDED FOR WHEEZING OR SHORTNESS OF BREATH. .  benzonatate (TESSALON) 100 MG capsule, Take 1 capsule (100 mg total) by mouth 2 (two) times daily as needed for cough.  Current Outpatient Medications (Analgesics):  .  meloxicam (MOBIC) 15 MG tablet, Take 1 tablet (15 mg total) by mouth daily. .  Oxycodone HCl 10 MG TABS, Take 1-2 tablets (10-20 mg total) by mouth every 4 (four) hours as needed (pain).  Current Outpatient Medications (Hematological):  .  vitamin B-12 (CYANOCOBALAMIN) 1000 MCG tablet, Take 1,000 mcg by mouth daily.  Current Outpatient Medications (Other):  Marland Kitchen   Ascorbic Acid (VITAMIN C) 1000 MG tablet, Take 1,000 mg by mouth daily. .  cholecalciferol (VITAMIN D3) 25 MCG (1000 UNIT) tablet, Take 1,000 Units by mouth daily. Marland Kitchen  doxycycline (VIBRA-TABS) 100 MG tablet, Take 1 tablet (100 mg total) by mouth 2 (two) times daily. .  DULoxetine (CYMBALTA) 60 MG capsule, Take 60 mg by mouth at bedtime. .  gabapentin (NEURONTIN) 300 MG capsule, Take 1 capsule (300 mg total) by mouth 3 (three) times daily. (Patient taking differently: Take 300 mg by mouth 4 (four) times daily.) .  Multiple Vitamin (MULTIVITAMIN WITH MINERALS) TABS tablet,  Take 1 tablet by mouth daily. Marland Kitchen  tiZANidine (ZANAFLEX) 4 MG tablet, Take 4 mg by mouth 3 (three) times daily.    Reviewed prior external information including notes and imaging from  primary care provider As well as notes that were available from care everywhere and other healthcare systems.  Past medical history, social, surgical and family history all reviewed in electronic medical record.  No pertanent information unless stated regarding to the chief complaint.   Review of Systems:  No headache, visual changes, nausea, vomiting, diarrhea, constipation, dizziness, abdominal pain, skin rash, fevers, chills, night sweats, weight loss, swollen lymph nodes,  joint swelling, chest pain, shortness of breath, mood changes. POSITIVE muscle aches, body aches   Objective  Blood pressure 116/84, pulse 99, height 5\' 11"  (1.803 m), weight 258 lb (117 kg), SpO2 96 %.   General: No apparent distress alert and oriented x3 mood and affect normal, dressed appropriately.  HEENT: Pupils equal, extraocular movements intact  Respiratory: Patient's speak in full sentences and does not appear short of breath  Cardiovascular: No lower extremity edema, non tender, no erythema  Gait normal with good balance and coordination.  MSK: Arthritic changes of multiple joints Foot exam bilaterally shows the patient does have actually cool to touch toes.   Patient has some mild decrease in posterior tibialis pulses at 1+ but does have 2+ dorsalis pedis pulses Brittainy transverse arch noted.  Mild splaying between the first and second toes.  Limited musculoskeletal ultrasound was performed and interpreted by Lyndal Pulley  Limited ultrasound of patient's feet bilaterally shows a mild neuroma noted on the left foot between the third and fourth metatarsal heads.  Mild dilatation of this nerve noted.  No significant cortical irregularity that was noted otherwise. Impression: Questionable neuroma   Impression and Recommendations:     The above documentation has been reviewed and is accurate and complete Lyndal Pulley, DO

## 2020-10-02 ENCOUNTER — Other Ambulatory Visit: Payer: Self-pay

## 2020-10-02 ENCOUNTER — Encounter: Payer: Self-pay | Admitting: Family Medicine

## 2020-10-02 ENCOUNTER — Ambulatory Visit: Payer: Self-pay

## 2020-10-02 ENCOUNTER — Ambulatory Visit: Payer: Medicare Other | Admitting: Family Medicine

## 2020-10-02 VITALS — BP 116/84 | HR 99 | Ht 71.0 in | Wt 258.0 lb

## 2020-10-02 DIAGNOSIS — M216X1 Other acquired deformities of right foot: Secondary | ICD-10-CM

## 2020-10-02 DIAGNOSIS — M216X9 Other acquired deformities of unspecified foot: Secondary | ICD-10-CM | POA: Diagnosis not present

## 2020-10-02 DIAGNOSIS — M216X2 Other acquired deformities of left foot: Secondary | ICD-10-CM

## 2020-10-02 DIAGNOSIS — M79671 Pain in right foot: Secondary | ICD-10-CM | POA: Diagnosis not present

## 2020-10-02 DIAGNOSIS — M5126 Other intervertebral disc displacement, lumbar region: Secondary | ICD-10-CM | POA: Diagnosis not present

## 2020-10-02 DIAGNOSIS — M79672 Pain in left foot: Secondary | ICD-10-CM | POA: Diagnosis not present

## 2020-10-02 DIAGNOSIS — M5416 Radiculopathy, lumbar region: Secondary | ICD-10-CM | POA: Diagnosis not present

## 2020-10-02 NOTE — Assessment & Plan Note (Signed)
Patient does have pronation deformity of the feet bilaterally.  Likely contributing to more of a breakdown of the transverse arch also noted today.  In addition to this though patient recently did have back surgery.  Could be potentially neurologic.  On ultrasound may be a mild neuroma noted.  Patient is already on a high dose of gabapentin.  Patient does have only 1+ posterior tibialis pulses noted and will get ABI of the lower extremities.  If that is a concern then we will refer appropriately.  If no significant improvement in 4 weeks we will have patient come back and try injection Potential neuroma.

## 2020-10-02 NOTE — Patient Instructions (Addendum)
ABI bilaterally Spenco total support orthotics Exercises See me in 4-6 weeks  [facility]  ([facility] [employer] in [facility] [facility]) [address] [place], [place] [address] [phone] 10/07/2020 11:00am, arrive at 10:45am

## 2020-10-02 NOTE — Assessment & Plan Note (Signed)
Loss of transverse arch bilaterally.  Patient noted does have some difficulty with the possible blood flow to the area with patient having cold feet noted today.  Patient does have good dorsalis pedis but low posterior tibialis.  Follow-up with me again in 4 to 8 weeks

## 2020-10-03 ENCOUNTER — Other Ambulatory Visit: Payer: Self-pay | Admitting: Family Medicine

## 2020-10-03 DIAGNOSIS — M79671 Pain in right foot: Secondary | ICD-10-CM

## 2020-10-07 ENCOUNTER — Other Ambulatory Visit: Payer: Self-pay

## 2020-10-07 ENCOUNTER — Ambulatory Visit (HOSPITAL_COMMUNITY)
Admission: RE | Admit: 2020-10-07 | Discharge: 2020-10-07 | Disposition: A | Discharge status: Home / Self Care | Payer: Medicare Other | Source: Ambulatory Visit | Attending: Internal Medicine | Admitting: Internal Medicine

## 2020-10-07 DIAGNOSIS — M79671 Pain in right foot: Secondary | ICD-10-CM | POA: Diagnosis not present

## 2020-10-07 DIAGNOSIS — M79672 Pain in left foot: Secondary | ICD-10-CM | POA: Insufficient documentation

## 2020-10-21 ENCOUNTER — Encounter: Payer: Self-pay | Admitting: Internal Medicine

## 2020-10-21 ENCOUNTER — Other Ambulatory Visit: Payer: Self-pay

## 2020-10-21 ENCOUNTER — Ambulatory Visit (INDEPENDENT_AMBULATORY_CARE_PROVIDER_SITE_OTHER): Payer: Medicare Other | Admitting: Internal Medicine

## 2020-10-21 VITALS — BP 140/84 | HR 83 | Temp 98.0°F | Ht 71.0 in | Wt 255.0 lb

## 2020-10-21 DIAGNOSIS — R238 Other skin changes: Secondary | ICD-10-CM

## 2020-10-21 DIAGNOSIS — R739 Hyperglycemia, unspecified: Secondary | ICD-10-CM | POA: Diagnosis not present

## 2020-10-21 DIAGNOSIS — Z0000 Encounter for general adult medical examination without abnormal findings: Secondary | ICD-10-CM

## 2020-10-21 DIAGNOSIS — L089 Local infection of the skin and subcutaneous tissue, unspecified: Secondary | ICD-10-CM | POA: Insufficient documentation

## 2020-10-21 DIAGNOSIS — E559 Vitamin D deficiency, unspecified: Secondary | ICD-10-CM

## 2020-10-21 DIAGNOSIS — S30860A Insect bite (nonvenomous) of lower back and pelvis, initial encounter: Secondary | ICD-10-CM

## 2020-10-21 DIAGNOSIS — R030 Elevated blood-pressure reading, without diagnosis of hypertension: Secondary | ICD-10-CM | POA: Diagnosis not present

## 2020-10-21 DIAGNOSIS — W57XXXA Bitten or stung by nonvenomous insect and other nonvenomous arthropods, initial encounter: Secondary | ICD-10-CM | POA: Diagnosis not present

## 2020-10-21 DIAGNOSIS — R202 Paresthesia of skin: Secondary | ICD-10-CM | POA: Insufficient documentation

## 2020-10-21 DIAGNOSIS — E538 Deficiency of other specified B group vitamins: Secondary | ICD-10-CM

## 2020-10-21 DIAGNOSIS — R233 Spontaneous ecchymoses: Secondary | ICD-10-CM

## 2020-10-21 DIAGNOSIS — E785 Hyperlipidemia, unspecified: Secondary | ICD-10-CM

## 2020-10-21 MED ORDER — DOXYCYCLINE HYCLATE 100 MG PO TABS: 100.0000 mg | ORAL_TABLET | Freq: Two times a day (BID) | ORAL | 0 refills | Status: DC

## 2020-10-21 NOTE — Assessment & Plan Note (Signed)
Conts to have echyomosis to the arms out of proportion to injury with working in the yard, no other bruising or bleeding, will have INR with next labs

## 2020-10-21 NOTE — Patient Instructions (Signed)
Please take all new medication as prescribed - the antibiotic  Please continue all other medications as before, and refills have been done if requested.  Please have the pharmacy call with any other refills you may need.  Please continue your efforts at being more active, low cholesterol diet, and weight control.  You are otherwise up to date with prevention measures today.  Please keep your appointments with your specialists as you may have planned  Please make an Appointment to return Oct 25, with labs done at the [facility] lab site a few days ahead

## 2020-10-21 NOTE — Progress Notes (Signed)
Patient ID: [name], male   DOB: [birth year], 59 y.o.   MRN: [id number]        Chief Complaint: follow up borderline HTN, hyperglycemia, infected tick bite, easy bruising       HPI:  [name] is a 60 y.o. male here with c/o 3 days onset mild to mod right buttock pain, red, tender sweling after tick removed, also 2 other ticks removed as well, after working in yard; conts to have easy bruising with large echymosis to the bilateral arms and hx of ETOH abuse.  Denies worsening depressive symptoms, suicidal ideation, or panic; No other overt bleeding  BP at home has been < 140/90.  Pt denies chest pain, increased sob or doe, wheezing, orthopnea, PND, increased LE swelling, palpitations, dizziness or syncope.   Pt denies polydipsia, polyuria, or new focal neuro s/s.  Did have recent amoxil then doxy course for dental infection may 2022 now resolved, but tolerated well.  Has lost some  recently with better diet.        Wt Readings from Last 3 Encounters:  10/21/20 255 lb (115.7 kg)  10/02/20 258 lb (117 kg)  08/21/20 275 lb 3.2 oz (124.8 kg)   BP Readings from Last 3 Encounters:  10/21/20 140/84  10/02/20 116/84  08/21/20 (!) 148/89         Past Medical History:  Diagnosis Date   Allergy    Anxiety    Arthritis    Back   Chronic back pain    Colon polyps    COPD (chronic obstructive pulmonary disease) (HCC)    Depression    GERD (gastroesophageal reflux disease)    every now and then   Headache    Hyperlipidemia 09/26/2015   Lumbar surgical wound fluid collection    lumbar wound dehiscence   Pneumonia 2020   Past Surgical History:  Procedure Laterality Date   BACK SURGERY     COLONOSCOPY     COLONOSCOPY W/ BIOPSIES AND POLYPECTOMY     EYE SURGERY     KNEE ARTHROSCOPY WITH MEDIAL MENISECTOMY Left 01/23/2016   Procedure: KNEE ARTHROSCOPY WITH MEDIAL MENISECTOMY;  Surgeon: Melrose Nakayama, MD;  Location: [facility];  Service: Orthopedics;  Laterality:  Left;   LUMBAR LAMINECTOMY/DECOMPRESSION MICRODISCECTOMY N/A 06/09/2020   Procedure: Decompressive laminectomy Lumbarone -two;  Surgeon: Eustace Moore, MD;  Location: [place];  Service: Neurosurgery;  Laterality: N/A;   LUMBAR WOUND DEBRIDEMENT N/A 03/21/2014   Procedure: LUMBAR WOUND DEBRIDEMENT;  Surgeon: Eustace Moore, MD;  Location: [facility];  Service: Neurosurgery;  Laterality: N/A;   MAXIMUM ACCESS (MAS)POSTERIOR LUMBAR INTERBODY FUSION (PLIF) 1 LEVEL N/A 02/14/2014   Procedure: FOR MAXIMUM ACCESS SURGERY POSTERIOR LUMBAR INTERBODY FUSION LUMBAR TWO-THREE;  Surgeon: Eustace Moore, MD;  Location: [facility];  Service: Neurosurgery;  Laterality: N/A;   NECK SURGERY     SHOULDER SURGERY     SPINE SURGERY     TONSILLECTOMY      reports that he has been smoking cigarettes. He has a 22.00 pack-year smoking history. His smokeless tobacco use includes snuff. He reports current alcohol use of about 24.0 standard drinks of alcohol per week. He reports that he does not use drugs. family history includes Aneurysm in his maternal grandfather; Aneurysm (age of onset: 74) in his maternal aunt; Colon cancer in his maternal grandfather; Colon cancer (age of onset: 44) in his father; Colon polyps in his brother; Depression (age of onset: 7)  in his brother; Heart disease in his maternal aunt; Hypertension in his mother; Stomach cancer in his father; Stomach cancer (age of onset: 17) in his paternal uncle; Stomach cancer (age of onset: 94) in his paternal uncle; Stomach cancer (age of onset: 53) in his paternal grandfather; Suicidality in his brother. Allergies  Allergen Reactions   Cefuroxime Axetil Hives   Cefuroxime Axetil Other (See Comments)   Current Outpatient Medications on File Prior to Visit  Medication Sig Dispense Refill   albuterol (VENTOLIN HFA) 108 (90 Base) MCG/ACT inhaler INHALE 1-2 PUFFS INTO THE LUNGS EVERY 6 (SIX) HOURS AS NEEDED FOR WHEEZING OR SHORTNESS OF BREATH. 8.5 each 11    Ascorbic Acid (VITAMIN C) 1000 MG tablet Take 1,000 mg by mouth daily.     atorvastatin (LIPITOR) 10 MG tablet TAKE 1 TABLET BY MOUTH EVERY DAY 90 tablet 0   benzonatate (TESSALON) 100 MG capsule Take 1 capsule (100 mg total) by mouth 2 (two) times daily as needed for cough. 20 capsule 0   cholecalciferol (VITAMIN D3) 25 MCG (1000 UNIT) tablet Take 1,000 Units by mouth daily.     DULoxetine (CYMBALTA) 60 MG capsule Take 60 mg by mouth at bedtime.  2   gabapentin (NEURONTIN) 300 MG capsule Take 1 capsule (300 mg total) by mouth 3 (three) times daily. (Patient taking differently: Take 300 mg by mouth 4 (four) times daily.) 90 capsule 3   meloxicam (MOBIC) 15 MG tablet Take 1 tablet (15 mg total) by mouth daily. 90 tablet 3   Multiple Vitamin (MULTIVITAMIN WITH MINERALS) TABS tablet Take 1 tablet by mouth daily.     Oxycodone HCl 10 MG TABS Take 1-2 tablets (10-20 mg total) by mouth every 4 (four) hours as needed (pain). 40 tablet 0   tiZANidine (ZANAFLEX) 4 MG tablet Take 4 mg by mouth 3 (three) times daily.   2   vitamin B-12 (CYANOCOBALAMIN) 1000 MCG tablet Take 1,000 mcg by mouth daily.     No current facility-administered medications on file prior to visit.        ROS:  All others reviewed and negative.  Objective        PE:  BP 140/84 (BP Location: Left Arm, Patient Position: Sitting, Cuff Size: Large)   Pulse 83   Temp 98 F (36.7 C) (Oral)   Ht 5\' 11"  (1.803 m)   Wt 255 lb (115.7 kg)   SpO2 98%   BMI 35.57 kg/m                 Constitutional: Pt appears in NAD               HENT: Head: NCAT.                Right Ear: External ear normal.                 Left Ear: External ear normal.                Eyes: . Pupils are equal, round, and reactive to light. Conjunctivae and EOM are normal               Nose: without d/c or deformity               Neck: Neck supple. Gross normal ROM               Cardiovascular: Normal rate and regular rhythm.  Pulmonary/Chest:  Effort normal and breath sounds without rales or wheezing.                Abd:  Soft, NT, ND, + BS, no organomegaly               Neurological: Pt is alert. At baseline orientation, motor grossly intact               Skin:LE edema -none, right mid buttock area red, tender swelling at site, also large echymosis to bilateral arms below the elbows               Psychiatric: Pt behavior is normal without agitation , mild nervous  Micro: none  Cardiac tracings I have personally interpreted today:  none  Pertinent Radiological findings (summarize): none   Lab Results  Component Value Date   WBC 12.8 (H) 06/03/2020   HGB 15.3 06/03/2020   HCT 46.0 06/03/2020   PLT 326 06/03/2020   GLUCOSE 122 (H) 06/03/2020   CHOL 156 02/21/2020   TRIG 124.0 02/21/2020   HDL 50.20 02/21/2020   LDLDIRECT 150.1 03/07/2012   LDLCALC 81 02/21/2020   ALT 31 02/21/2020   AST 19 02/21/2020   NA 138 06/03/2020   K 4.7 06/03/2020   CL 104 06/03/2020   CREATININE 0.69 06/03/2020   BUN 11 06/03/2020   CO2 25 06/03/2020   TSH 2.31 02/21/2020   PSA 0.77 02/21/2020   INR 0.9 06/03/2020   HGBA1C 6.4 02/21/2020   Assessment/Plan:  DONIE LEMELIN is a 60 y.o. White or Caucasian [1] male with  has a past medical history of Allergy, Anxiety, Arthritis, Chronic back pain, Colon polyps, COPD (chronic obstructive pulmonary disease) ([place]), Depression, GERD (gastroesophageal reflux disease), Headache, Hyperlipidemia (09/26/2015), Lumbar surgical wound fluid collection, and Pneumonia (2020).  Easy bruising Conts to have echyomosis to the arms out of proportion to injury with working in the yard, no other bruising or bleeding, will have INR with next labs  Elevated blood-pressure reading, without diagnosis of hypertension Borderline uncontrolled today, declines change in med tx at this time, cont to monitor BP at home and next visit  Hyperglycemia Lab Results  Component Value Date   HGBA1C 6.4 02/21/2020    Stable, pt to continue current medical treatment  - diet   Infected tick bite of buttock Mild to mod, for antibx course,  to f/u any worsening symptoms or concerns  Followup: Return in about 18 weeks (around 02/24/2021).  Cathlean Cower, MD 10/21/2020 9:43 AM [facility]

## 2020-10-21 NOTE — Assessment & Plan Note (Signed)
Mild to mod, for antibx course,  to f/u any worsening symptoms or concerns 

## 2020-10-21 NOTE — Assessment & Plan Note (Signed)
Lab Results  Component Value Date   HGBA1C 6.4 02/21/2020   Stable, pt to continue current medical treatment  - diet

## 2020-10-21 NOTE — Assessment & Plan Note (Signed)
Borderline uncontrolled today, declines change in med tx at this time, cont to monitor BP at home and next visit

## 2020-10-22 ENCOUNTER — Ambulatory Visit: Payer: Medicare Other | Admitting: Family Medicine

## 2020-10-23 DIAGNOSIS — M7061 Trochanteric bursitis, right hip: Secondary | ICD-10-CM | POA: Diagnosis not present

## 2020-10-23 DIAGNOSIS — M5416 Radiculopathy, lumbar region: Secondary | ICD-10-CM | POA: Diagnosis not present

## 2020-10-27 NOTE — Progress Notes (Signed)
[facility] [address] [place] Phone: [phone] Subjective:   I [name] am serving as a Education administrator for Dr. Hulan Saas.  This visit occurred during the SARS-CoV-2 public health emergency.  Safety protocols were in place, including screening questions prior to the visit, additional usage of staff PPE, and extensive cleaning of exam room while observing appropriate contact time as indicated for disinfecting solutions.    I'm seeing this patient by the request  of:  [name], MD  CC: Bilateral foot pain  [id number]  10/02/2020 Loss of transverse arch bilaterally.  Patient noted does have some difficulty with the possible blood flow to the area with patient having cold feet noted today.  Patient does have good dorsalis pedis but low posterior tibialis.  Follow-up with me again in 4 to 8 weeks  Update 10/27/2020 FERDINAND REVOIR is a 60 y.o. male coming in with complaint of B foot pain. Patient states today he feet are not too bad. Pain comes and goes.  Patient states overall may be making a little improvement since the over-the-counter orthotics.  Patient's ABIs were relatively normal except for some mild decrease in the large toe.    Past Medical History:  Diagnosis Date   Allergy    Anxiety    Arthritis    Back   Chronic back pain    Colon polyps    COPD (chronic obstructive pulmonary disease) (HCC)    Depression    GERD (gastroesophageal reflux disease)    every now and then   Headache    Hyperlipidemia 09/26/2015   Lumbar surgical wound fluid collection    lumbar wound dehiscence   Pneumonia 2020   Past Surgical History:  Procedure Laterality Date   BACK SURGERY     COLONOSCOPY     COLONOSCOPY W/ BIOPSIES AND POLYPECTOMY     EYE SURGERY     KNEE ARTHROSCOPY WITH MEDIAL MENISECTOMY Left 01/23/2016   Procedure: KNEE ARTHROSCOPY WITH MEDIAL MENISECTOMY;  Surgeon: Melrose Nakayama, MD;  Location: [facility];  Service: Orthopedics;  Laterality: Left;   LUMBAR LAMINECTOMY/DECOMPRESSION MICRODISCECTOMY N/A 06/09/2020   Procedure: Decompressive laminectomy Lumbarone -two;  Surgeon: Eustace Moore, MD;  Location: [place];  Service: Neurosurgery;  Laterality: N/A;   LUMBAR WOUND DEBRIDEMENT N/A 03/21/2014   Procedure: LUMBAR WOUND DEBRIDEMENT;  Surgeon: Eustace Moore, MD;  Location: [facility];  Service: Neurosurgery;  Laterality: N/A;   MAXIMUM ACCESS (MAS)POSTERIOR LUMBAR INTERBODY FUSION (PLIF) 1 LEVEL N/A 02/14/2014   Procedure: FOR MAXIMUM ACCESS SURGERY POSTERIOR LUMBAR INTERBODY FUSION LUMBAR TWO-THREE;  Surgeon: Eustace Moore, MD;  Location: [facility];  Service: Neurosurgery;  Laterality: N/A;   NECK SURGERY     SHOULDER SURGERY     SPINE SURGERY     TONSILLECTOMY     Social History   Socioeconomic History   Marital status: Married    Spouse name: cindy   Number of children: Not on file   Years of education: Not on file   Highest education level: Not on file  Occupational History   Occupation: produce manager/ disability    Employer: [employer]  Tobacco Use   Smoking status: Every Day    Packs/day: 0.50    Years: 44.00    Pack years: 22.00    Types: Cigarettes   Smokeless tobacco: Current    Types: Snuff  Vaping Use   Vaping Use: Never used  Substance and Sexual Activity   Alcohol use: Yes    Alcohol/week: 24.0 standard drinks    Types: 24 Standard drinks or equivalent per week    Comment: social beer   Drug use: No   Sexual activity: Yes    Partners: Female  Other Topics Concern   Not on file  Social History Narrative   Exercise--no   Social Determinants of Health   Financial Resource Strain: Low Risk    Difficulty of Paying Living Expenses: Not hard at all  Food Insecurity: No Food Insecurity   Worried About Charity fundraiser in the Last Year: Never true   Arboriculturist in the Last Year: Never true  Transportation Needs: No Transportation  Needs   Lack of Transportation (Medical): No   Lack of Transportation (Non-Medical): No  Physical Activity: Inactive   Days of Exercise per Week: 0 days   Minutes of Exercise per Session: 0 min  Stress: No Stress Concern Present   Feeling of Stress : Not at all  Social Connections: Not on file   Allergies  Allergen Reactions   Cefuroxime Axetil Hives   Cefuroxime Axetil Other (See Comments)   Family History  Problem Relation Age of Onset   Colon cancer Father 46       pt thinks father had colon cancer   Stomach cancer Father    Hypertension Mother    Colon polyps Brother    Aneurysm Maternal Aunt 49       brain   Heart disease Maternal Aunt        d 73   Stomach cancer Paternal Uncle 75   Aneurysm Maternal Grandfather        stomach   Colon cancer Maternal Grandfather        thinks grandfather had colon cancer   Stomach cancer Paternal Grandfather 15   Depression Brother 40   Suicidality Brother    Stomach cancer Paternal Uncle 5   Esophageal cancer Neg Hx    Rectal cancer Neg Hx    Diabetes Neg Hx      Current Outpatient Medications (Cardiovascular):    atorvastatin (LIPITOR) 10 MG tablet, TAKE 1 TABLET BY MOUTH EVERY DAY  Current Outpatient Medications (Respiratory):    albuterol (VENTOLIN HFA) 108 (90 Base) MCG/ACT inhaler, INHALE 1-2 PUFFS INTO THE LUNGS EVERY 6 (SIX) HOURS AS NEEDED FOR WHEEZING OR SHORTNESS OF BREATH.   benzonatate (TESSALON) 100 MG capsule, Take 1 capsule (100 mg total) by mouth 2 (two) times daily as needed for cough.  Current Outpatient Medications (Analgesics):    meloxicam (MOBIC) 15 MG tablet, Take 1 tablet (15 mg total) by mouth daily.   Oxycodone HCl 10 MG TABS, Take 1-2 tablets (10-20 mg total) by mouth every 4 (four) hours as needed (pain).  Current Outpatient Medications (Hematological):    vitamin B-12 (CYANOCOBALAMIN) 1000 MCG tablet, Take 1,000 mcg by mouth daily.  Current Outpatient Medications (Other):    Ascorbic Acid  (VITAMIN C) 1000 MG tablet, Take 1,000 mg by mouth daily.   cholecalciferol (VITAMIN D3) 25 MCG (1000 UNIT) tablet, Take 1,000 Units by mouth daily.   doxycycline (VIBRA-TABS) 100 MG tablet, Take 1 tablet (100 mg total) by mouth 2 (two) times daily.   DULoxetine (CYMBALTA) 60 MG capsule, Take 60 mg by mouth at bedtime.   gabapentin (NEURONTIN) 300 MG capsule, Take 1 capsule (300 mg total) by mouth 3 (three) times daily. (Patient taking differently: Take 300 mg by mouth  4 (four) times daily.)   Multiple Vitamin (MULTIVITAMIN WITH MINERALS) TABS tablet, Take 1 tablet by mouth daily.   tiZANidine (ZANAFLEX) 4 MG tablet, Take 4 mg by mouth 3 (three) times daily.    Reviewed prior external information including notes and imaging from  primary care provider As well as notes that were available from care everywhere and other healthcare systems.  Past medical history, social, surgical and family history all reviewed in electronic medical record.  No pertanent information unless stated regarding to the chief complaint.   Review of Systems:  No headache, visual changes, nausea, vomiting, diarrhea, constipation, dizziness, abdominal pain, skin rash, fevers, chills, night sweats, weight loss, swollen lymph nodes, body aches, joint swelling, chest pain, shortness of breath, mood changes. POSITIVE muscle aches  Objective  Blood pressure 140/80, pulse 96, height 5\' 11"  (1.803 m), weight 256 lb (116.1 kg), SpO2 97 %.   General: No apparent distress alert and oriented x3 mood and affect normal, dressed appropriately.  HEENT: Pupils equal, extraocular movements intact  Respiratory: Patient's speak in full sentences and does not appear short of breath  Cardiovascular: No lower extremity edema, non tender, no erythema  Gait normal with good balance and coordination.  MSK: Foot exam still has some mild numbness at the toes.  Breakdown of the transverse arch.  Patient does have overpronation of the hindfoot  bilaterally.  Patient still has some mild tenderness on the plantar aspect of the foot.  Some mild tightness of the ankle.    Impression and Recommendations:    The above documentation has been reviewed and is accurate and complete Lyndal Pulley, DO

## 2020-10-28 ENCOUNTER — Encounter: Payer: Self-pay | Admitting: Family Medicine

## 2020-10-28 ENCOUNTER — Other Ambulatory Visit: Payer: Self-pay

## 2020-10-28 ENCOUNTER — Ambulatory Visit: Payer: Medicare Other | Admitting: Family Medicine

## 2020-10-28 VITALS — BP 140/80 | HR 96 | Ht 71.0 in | Wt 256.0 lb

## 2020-10-28 DIAGNOSIS — M79672 Pain in left foot: Secondary | ICD-10-CM | POA: Diagnosis not present

## 2020-10-28 DIAGNOSIS — M79671 Pain in right foot: Secondary | ICD-10-CM | POA: Diagnosis not present

## 2020-10-28 NOTE — Patient Instructions (Addendum)
Good to see you Orthotics Continue everything else See me again in 6 weeks

## 2020-10-28 NOTE — Assessment & Plan Note (Signed)
Patient has had fibromatosis of the plantar fascia before.  Discussed with patient icing regimen and home exercises.  Patient is doing well with the over-the-counter orthotics.  Patient's ABIs were almost completely unremarkable except for some mild decrease in blood flow into the large toe.  Still think it is within normal range.  Patient will continue with conservative therapy but will be fitted for custom orthotics that I think will be beneficial.  Discussed which activities to do which wants to avoid.  Follow-up again in 6 to 8 weeks

## 2020-11-12 ENCOUNTER — Encounter: Payer: Self-pay | Admitting: Family Medicine

## 2020-11-12 ENCOUNTER — Other Ambulatory Visit: Payer: Self-pay

## 2020-11-12 ENCOUNTER — Ambulatory Visit: Payer: Medicare Other | Admitting: Family Medicine

## 2020-11-12 DIAGNOSIS — M722 Plantar fascial fibromatosis: Secondary | ICD-10-CM

## 2020-11-12 NOTE — Progress Notes (Signed)
[name] - 60 y.o. male MRN [id number]  Date of birth: 11/15/1960  SUBJECTIVE:  Including CC & ROS.  No chief complaint on file.   [name] is a 60 y.o. male that is presenting with acute on chronic bilateral foot pain.  Has tried over-the-counter insoles.   Review of Systems See HPI   HISTORY: Past Medical, Surgical, Social, and Family History Reviewed & Updated per EMR.   Pertinent Historical Findings include:  Past Medical History:  Diagnosis Date   Allergy    Anxiety    Arthritis    Back   Chronic back pain    Colon polyps    COPD (chronic obstructive pulmonary disease) (HCC)    Depression    GERD (gastroesophageal reflux disease)    every now and then   Headache    Hyperlipidemia 09/26/2015   Lumbar surgical wound fluid collection    lumbar wound dehiscence   Pneumonia 2020    Past Surgical History:  Procedure Laterality Date   BACK SURGERY     COLONOSCOPY     COLONOSCOPY W/ BIOPSIES AND POLYPECTOMY     EYE SURGERY     KNEE ARTHROSCOPY WITH MEDIAL MENISECTOMY Left 01/23/2016   Procedure: KNEE ARTHROSCOPY WITH MEDIAL MENISECTOMY;  Surgeon: Melrose Nakayama, MD;  Location: [facility];  Service: Orthopedics;  Laterality: Left;   LUMBAR LAMINECTOMY/DECOMPRESSION MICRODISCECTOMY N/A 06/09/2020   Procedure: Decompressive laminectomy Lumbarone -two;  Surgeon: Eustace Moore, MD;  Location: [facility] [place];  Service: Neurosurgery;  Laterality: N/A;   LUMBAR WOUND DEBRIDEMENT N/A 03/21/2014   Procedure: LUMBAR WOUND DEBRIDEMENT;  Surgeon: Eustace Moore, MD;  Location: [facility];  Service: Neurosurgery;  Laterality: N/A;   MAXIMUM ACCESS (MAS)POSTERIOR LUMBAR INTERBODY FUSION (PLIF) 1 LEVEL N/A 02/14/2014   Procedure: FOR MAXIMUM ACCESS SURGERY POSTERIOR LUMBAR INTERBODY FUSION LUMBAR TWO-THREE;  Surgeon: Eustace Moore, MD;  Location: [facility];  Service: Neurosurgery;  Laterality: N/A;   NECK SURGERY     SHOULDER SURGERY     SPINE SURGERY      TONSILLECTOMY      Family History  Problem Relation Age of Onset   Colon cancer Father 64       pt thinks father had colon cancer   Stomach cancer Father    Hypertension Mother    Colon polyps Brother    Aneurysm Maternal Aunt 63       brain   Heart disease Maternal Aunt        d 21   Stomach cancer Paternal Uncle 34   Aneurysm Maternal Grandfather        stomach   Colon cancer Maternal Grandfather        thinks grandfather had colon cancer   Stomach cancer Paternal Grandfather 56   Depression Brother 81   Suicidality Brother    Stomach cancer Paternal Uncle 95   Esophageal cancer Neg Hx    Rectal cancer Neg Hx    Diabetes Neg Hx     Social History   Socioeconomic History   Marital status: Married    Spouse name: cindy   Number of children: Not on file   Years of education: Not on file   Highest education level: Not on file  Occupational History   Occupation: produce manager/ disability    Employer: [employer]  Tobacco Use   Smoking status: Every Day    Packs/day: 0.50    Years: 44.00    Pack  years: 22.00    Types: Cigarettes   Smokeless tobacco: Current    Types: Snuff  Vaping Use   Vaping Use: Never used  Substance and Sexual Activity   Alcohol use: Yes    Alcohol/week: 24.0 standard drinks    Types: 24 Standard drinks or equivalent per week    Comment: social beer   Drug use: No   Sexual activity: Yes    Partners: Female  Other Topics Concern   Not on file  Social History Narrative   Exercise--no   Social Determinants of Health   Financial Resource Strain: Low Risk    Difficulty of Paying Living Expenses: Not hard at all  Food Insecurity: No Food Insecurity   Worried About Charity fundraiser in the Last Year: Never true   Arboriculturist in the Last Year: Never true  Transportation Needs: No Transportation Needs   Lack of Transportation (Medical): No   Lack of Transportation (Non-Medical): No  Physical Activity: Inactive   Days of  Exercise per Week: 0 days   Minutes of Exercise per Session: 0 min  Stress: No Stress Concern Present   Feeling of Stress : Not at all  Social Connections: Not on file  Intimate Partner Violence: Not on file     PHYSICAL EXAM:  VS: Ht 5\' 11"  (1.803 m)   Wt 256 lb (116.1 kg)   BMI 35.70 kg/m  Physical Exam Gen: NAD, alert, cooperative with exam, well-appearing MSK:  Right and left foot: Loss of the longitudinal arch. Normal range of motion. Normal strength resistance. Neurovascular intact  Patient was fitted for a standard, cushioned, semi-rigid orthotic. The orthotic was heated and afterward the patient stood on the orthotic blank positioned on the orthotic stand. The patient was positioned in subtalar neutral position and 10 degrees of ankle dorsiflexion in a weight bearing stance. After completion of molding, a stable base was applied to the orthotic blank. The blank was ground to a stable position for weight bearing. Size: 12 Pairs: 2 Base: Blue EVA Additional Posting and Padding: None The patient ambulated these, and they were very comfortable.   ASSESSMENT & PLAN:   Fibromatosis of plantar fascia Acute on chronic in nature.  Has long history of pain and altered sensation that he experiences in his feet.  Has had multiple back surgeries that could be contributing as well. -Counseled on home exercise therapy and supportive care. -Orthotics today. -Could consider adding scaphoid or metatarsal pads.

## 2020-11-12 NOTE — Assessment & Plan Note (Signed)
Acute on chronic in nature.  Has long history of pain and altered sensation that he experiences in his feet.  Has had multiple back surgeries that could be contributing as well. -Counseled on home exercise therapy and supportive care. -Orthotics today. -Could consider adding scaphoid or metatarsal pads.

## 2020-11-17 ENCOUNTER — Encounter: Payer: Self-pay | Admitting: Internal Medicine

## 2020-11-17 ENCOUNTER — Ambulatory Visit (INDEPENDENT_AMBULATORY_CARE_PROVIDER_SITE_OTHER): Payer: Medicare Other | Admitting: Internal Medicine

## 2020-11-17 ENCOUNTER — Inpatient Hospital Stay (HOSPITAL_COMMUNITY)
Admission: EM | Admit: 2020-11-17 | Discharge: 2020-11-20 | DRG: 157 | Disposition: A | Discharge status: Home Health | Payer: Medicare Other | Attending: Family Medicine | Admitting: Family Medicine

## 2020-11-17 ENCOUNTER — Telehealth: Payer: Self-pay | Admitting: Internal Medicine

## 2020-11-17 ENCOUNTER — Other Ambulatory Visit: Payer: Self-pay

## 2020-11-17 ENCOUNTER — Ambulatory Visit
Admission: RE | Admit: 2020-11-17 | Discharge: 2020-11-17 | Disposition: A | Discharge status: Home / Self Care | Payer: Medicare Other | Source: Ambulatory Visit | Attending: Internal Medicine | Admitting: Internal Medicine

## 2020-11-17 ENCOUNTER — Ambulatory Visit (INDEPENDENT_AMBULATORY_CARE_PROVIDER_SITE_OTHER)
Admission: RE | Admit: 2020-11-17 | Discharge: 2020-11-17 | Disposition: A | Discharge status: Home / Self Care | Payer: Medicare Other | Source: Ambulatory Visit | Attending: Internal Medicine | Admitting: Internal Medicine

## 2020-11-17 VITALS — BP 140/80 | HR 100 | Temp 98.2°F | Ht 71.0 in | Wt 248.0 lb

## 2020-11-17 DIAGNOSIS — E559 Vitamin D deficiency, unspecified: Secondary | ICD-10-CM | POA: Diagnosis not present

## 2020-11-17 DIAGNOSIS — M2548 Effusion, other site: Secondary | ICD-10-CM | POA: Diagnosis not present

## 2020-11-17 DIAGNOSIS — H70012 Subperiosteal abscess of mastoid, left ear: Secondary | ICD-10-CM | POA: Diagnosis not present

## 2020-11-17 DIAGNOSIS — J449 Chronic obstructive pulmonary disease, unspecified: Secondary | ICD-10-CM | POA: Diagnosis not present

## 2020-11-17 DIAGNOSIS — Z6834 Body mass index (BMI) 34.0-34.9, adult: Secondary | ICD-10-CM | POA: Diagnosis not present

## 2020-11-17 DIAGNOSIS — R634 Abnormal weight loss: Secondary | ICD-10-CM | POA: Diagnosis not present

## 2020-11-17 DIAGNOSIS — D72829 Elevated white blood cell count, unspecified: Secondary | ICD-10-CM | POA: Diagnosis present

## 2020-11-17 DIAGNOSIS — E785 Hyperlipidemia, unspecified: Secondary | ICD-10-CM

## 2020-11-17 DIAGNOSIS — R7401 Elevation of levels of liver transaminase levels: Secondary | ICD-10-CM | POA: Diagnosis not present

## 2020-11-17 DIAGNOSIS — Z888 Allergy status to other drugs, medicaments and biological substances status: Secondary | ICD-10-CM

## 2020-11-17 DIAGNOSIS — E538 Deficiency of other specified B group vitamins: Secondary | ICD-10-CM

## 2020-11-17 DIAGNOSIS — Z0000 Encounter for general adult medical examination without abnormal findings: Secondary | ICD-10-CM

## 2020-11-17 DIAGNOSIS — I82612 Acute embolism and thrombosis of superficial veins of left upper extremity: Secondary | ICD-10-CM | POA: Diagnosis not present

## 2020-11-17 DIAGNOSIS — E669 Obesity, unspecified: Secondary | ICD-10-CM | POA: Diagnosis present

## 2020-11-17 DIAGNOSIS — M5416 Radiculopathy, lumbar region: Secondary | ICD-10-CM | POA: Diagnosis not present

## 2020-11-17 DIAGNOSIS — J440 Chronic obstructive pulmonary disease with acute lower respiratory infection: Secondary | ICD-10-CM | POA: Diagnosis not present

## 2020-11-17 DIAGNOSIS — M542 Cervicalgia: Secondary | ICD-10-CM | POA: Diagnosis not present

## 2020-11-17 DIAGNOSIS — R059 Cough, unspecified: Secondary | ICD-10-CM | POA: Diagnosis not present

## 2020-11-17 DIAGNOSIS — Z79899 Other long term (current) drug therapy: Secondary | ICD-10-CM

## 2020-11-17 DIAGNOSIS — Z818 Family history of other mental and behavioral disorders: Secondary | ICD-10-CM | POA: Diagnosis not present

## 2020-11-17 DIAGNOSIS — L089 Local infection of the skin and subcutaneous tissue, unspecified: Secondary | ICD-10-CM

## 2020-11-17 DIAGNOSIS — Z800 Family history of malignant neoplasm of digestive organs: Secondary | ICD-10-CM

## 2020-11-17 DIAGNOSIS — M6008 Infective myositis, other site: Secondary | ICD-10-CM | POA: Diagnosis present

## 2020-11-17 DIAGNOSIS — M1990 Unspecified osteoarthritis, unspecified site: Secondary | ICD-10-CM | POA: Diagnosis present

## 2020-11-17 DIAGNOSIS — Z981 Arthrodesis status: Secondary | ICD-10-CM | POA: Diagnosis not present

## 2020-11-17 DIAGNOSIS — M549 Dorsalgia, unspecified: Secondary | ICD-10-CM | POA: Diagnosis not present

## 2020-11-17 DIAGNOSIS — J42 Unspecified chronic bronchitis: Secondary | ICD-10-CM | POA: Diagnosis not present

## 2020-11-17 DIAGNOSIS — F17200 Nicotine dependence, unspecified, uncomplicated: Secondary | ICD-10-CM | POA: Diagnosis present

## 2020-11-17 DIAGNOSIS — F17210 Nicotine dependence, cigarettes, uncomplicated: Secondary | ICD-10-CM | POA: Diagnosis not present

## 2020-11-17 DIAGNOSIS — M272 Inflammatory conditions of jaws: Secondary | ICD-10-CM | POA: Diagnosis not present

## 2020-11-17 DIAGNOSIS — I82619 Acute embolism and thrombosis of superficial veins of unspecified upper extremity: Secondary | ICD-10-CM

## 2020-11-17 DIAGNOSIS — R220 Localized swelling, mass and lump, head: Secondary | ICD-10-CM | POA: Diagnosis not present

## 2020-11-17 DIAGNOSIS — Z20822 Contact with and (suspected) exposure to covid-19: Secondary | ICD-10-CM | POA: Diagnosis present

## 2020-11-17 DIAGNOSIS — Z8371 Family history of colonic polyps: Secondary | ICD-10-CM

## 2020-11-17 DIAGNOSIS — K219 Gastro-esophageal reflux disease without esophagitis: Secondary | ICD-10-CM | POA: Diagnosis not present

## 2020-11-17 DIAGNOSIS — Z8249 Family history of ischemic heart disease and other diseases of the circulatory system: Secondary | ICD-10-CM

## 2020-11-17 DIAGNOSIS — R519 Headache, unspecified: Secondary | ICD-10-CM | POA: Diagnosis not present

## 2020-11-17 DIAGNOSIS — M47812 Spondylosis without myelopathy or radiculopathy, cervical region: Secondary | ICD-10-CM | POA: Diagnosis not present

## 2020-11-17 DIAGNOSIS — G8929 Other chronic pain: Secondary | ICD-10-CM | POA: Diagnosis present

## 2020-11-17 DIAGNOSIS — H748X2 Other specified disorders of left middle ear and mastoid: Secondary | ICD-10-CM | POA: Diagnosis not present

## 2020-11-17 DIAGNOSIS — S30860A Insect bite (nonvenomous) of lower back and pelvis, initial encounter: Secondary | ICD-10-CM | POA: Diagnosis not present

## 2020-11-17 DIAGNOSIS — R7989 Other specified abnormal findings of blood chemistry: Secondary | ICD-10-CM | POA: Diagnosis present

## 2020-11-17 DIAGNOSIS — R233 Spontaneous ecchymoses: Secondary | ICD-10-CM

## 2020-11-17 DIAGNOSIS — E43 Unspecified severe protein-calorie malnutrition: Secondary | ICD-10-CM | POA: Diagnosis not present

## 2020-11-17 DIAGNOSIS — Z720 Tobacco use: Secondary | ICD-10-CM | POA: Diagnosis not present

## 2020-11-17 DIAGNOSIS — I471 Supraventricular tachycardia: Secondary | ICD-10-CM | POA: Diagnosis present

## 2020-11-17 DIAGNOSIS — W57XXXA Bitten or stung by nonvenomous insect and other nonvenomous arthropods, initial encounter: Secondary | ICD-10-CM

## 2020-11-17 DIAGNOSIS — R221 Localized swelling, mass and lump, neck: Secondary | ICD-10-CM | POA: Insufficient documentation

## 2020-11-17 DIAGNOSIS — Z791 Long term (current) use of non-steroidal anti-inflammatories (NSAID): Secondary | ICD-10-CM

## 2020-11-17 DIAGNOSIS — J209 Acute bronchitis, unspecified: Secondary | ICD-10-CM | POA: Diagnosis present

## 2020-11-17 DIAGNOSIS — R238 Other skin changes: Secondary | ICD-10-CM | POA: Diagnosis not present

## 2020-11-17 DIAGNOSIS — K122 Cellulitis and abscess of mouth: Secondary | ICD-10-CM | POA: Diagnosis not present

## 2020-11-17 DIAGNOSIS — R739 Hyperglycemia, unspecified: Secondary | ICD-10-CM | POA: Diagnosis not present

## 2020-11-17 DIAGNOSIS — M7989 Other specified soft tissue disorders: Secondary | ICD-10-CM | POA: Diagnosis not present

## 2020-11-17 LAB — CBC WITH DIFFERENTIAL/PLATELET
Abs Immature Granulocytes: 0.5 10*3/uL — ABNORMAL HIGH (ref 0.00–0.07)
Basophils Absolute: 0.1 10*3/uL (ref 0.0–0.1)
Basophils Absolute: 0.1 10*3/uL (ref 0.0–0.1)
Basophils Relative: 0.4 % (ref 0.0–3.0)
Basophils Relative: 1 %
Eosinophils Absolute: 0 10*3/uL (ref 0.0–0.7)
Eosinophils Absolute: 0.1 10*3/uL (ref 0.0–0.5)
Eosinophils Relative: 0.1 % (ref 0.0–5.0)
Eosinophils Relative: 0 %
HCT: 43.2 % (ref 39.0–52.0)
HCT: 45.8 % (ref 39.0–52.0)
Hemoglobin: 14.6 g/dL (ref 13.0–17.0)
Hemoglobin: 14.9 g/dL (ref 13.0–17.0)
Immature Granulocytes: 3 %
Lymphocytes Relative: 8.5 % — ABNORMAL LOW (ref 12.0–46.0)
Lymphocytes Relative: 16 %
Lymphs Abs: 1.5 10*3/uL (ref 0.7–4.0)
Lymphs Abs: 2.9 10*3/uL (ref 0.7–4.0)
MCH: 30.7 pg (ref 26.0–34.0)
MCHC: 33.7 g/dL (ref 30.0–36.0)
MCHC: 32.5 g/dL (ref 30.0–36.0)
MCV: 90.3 fl (ref 78.0–100.0)
MCV: 94.2 fL (ref 80.0–100.0)
Monocytes Absolute: 1.1 10*3/uL — ABNORMAL HIGH (ref 0.1–1.0)
Monocytes Absolute: 1.5 10*3/uL — ABNORMAL HIGH (ref 0.1–1.0)
Monocytes Relative: 6.1 % (ref 3.0–12.0)
Monocytes Relative: 8 %
Neutro Abs: 15.2 10*3/uL — ABNORMAL HIGH (ref 1.4–7.7)
Neutro Abs: 13.4 10*3/uL — ABNORMAL HIGH (ref 1.7–7.7)
Neutrophils Relative %: 84.9 % — ABNORMAL HIGH (ref 43.0–77.0)
Neutrophils Relative %: 72 %
Platelets: 322 10*3/uL (ref 150.0–400.0)
Platelets: 342 10*3/uL (ref 150–400)
RBC: 4.79 Mil/uL (ref 4.22–5.81)
RBC: 4.86 MIL/uL (ref 4.22–5.81)
RDW: 16.9 % — ABNORMAL HIGH (ref 11.5–15.5)
RDW: 16.1 % — ABNORMAL HIGH (ref 11.5–15.5)
WBC: 17.9 10*3/uL — ABNORMAL HIGH (ref 4.0–10.5)
WBC: 18.5 10*3/uL — ABNORMAL HIGH (ref 4.0–10.5)
nRBC: 0 % (ref 0.0–0.2)

## 2020-11-17 LAB — LIPID PANEL
Cholesterol: 133 mg/dL (ref 0–200)
HDL: 50.5 mg/dL (ref >39.00)
LDL Cholesterol: 65 mg/dL (ref 0–99)
NonHDL: 82.07
Total CHOL/HDL Ratio: 3
Triglycerides: 85 mg/dL (ref 0.0–149.0)
VLDL: 17 mg/dL (ref 0.0–40.0)

## 2020-11-17 LAB — HEPATIC FUNCTION PANEL
ALT: 58 U/L — ABNORMAL HIGH (ref 0–53)
AST: 21 U/L (ref 0–37)
Albumin: 4.1 g/dL (ref 3.5–5.2)
Alkaline Phosphatase: 92 U/L (ref 39–117)
Bilirubin, Direct: 0.2 mg/dL (ref 0.0–0.3)
Total Bilirubin: 0.8 mg/dL (ref 0.2–1.2)
Total Protein: 6.6 g/dL (ref 6.0–8.3)

## 2020-11-17 LAB — URINALYSIS, ROUTINE W REFLEX MICROSCOPIC
Leukocytes,Ua: NEGATIVE
Nitrite: NEGATIVE
Specific Gravity, Urine: 1.025 (ref 1.000–1.030)
Total Protein, Urine: 30 — AB
Urine Glucose: NEGATIVE
Urobilinogen, UA: 1 (ref 0.0–1.0)
WBC, UA: NONE SEEN (ref 0–?)
pH: 6 (ref 5.0–8.0)

## 2020-11-17 LAB — PROTIME-INR
INR: 1 ratio (ref 0.8–1.0)
Prothrombin Time: 11.6 s (ref 9.6–13.1)

## 2020-11-17 LAB — BASIC METABOLIC PANEL
Anion gap: 9 (ref 5–15)
BUN: 16 mg/dL (ref 6–23)
BUN: 17 mg/dL (ref 6–20)
CO2: 26 mEq/L (ref 19–32)
CO2: 27 mmol/L (ref 22–32)
Calcium: 9.2 mg/dL (ref 8.4–10.5)
Calcium: 9.5 mg/dL (ref 8.9–10.3)
Chloride: 101 mEq/L (ref 96–112)
Chloride: 98 mmol/L (ref 98–111)
Creatinine, Ser: 0.64 mg/dL (ref 0.40–1.50)
Creatinine, Ser: 0.74 mg/dL (ref 0.61–1.24)
GFR, Estimated: >60 mL/min (ref >60)
GFR: 104.01 mL/min (ref >60.00)
Glucose, Bld: 107 mg/dL — ABNORMAL HIGH (ref 70–99)
Glucose, Bld: 70 mg/dL (ref 70–99)
Potassium: 4.5 mEq/L (ref 3.5–5.1)
Potassium: 4.2 mmol/L (ref 3.5–5.1)
Sodium: 137 mEq/L (ref 135–145)
Sodium: 134 mmol/L — ABNORMAL LOW (ref 135–145)

## 2020-11-17 LAB — HEMOGLOBIN A1C: Hgb A1c MFr Bld: 6.5 % (ref 4.6–6.5)

## 2020-11-17 LAB — VITAMIN D 25 HYDROXY (VIT D DEFICIENCY, FRACTURES): VITD: 54.65 ng/mL (ref 30.00–100.00)

## 2020-11-17 LAB — VITAMIN B12: Vitamin B-12: 423 pg/mL (ref 211–911)

## 2020-11-17 LAB — TSH: TSH: 1.22 u[IU]/mL (ref 0.35–5.50)

## 2020-11-17 LAB — LACTIC ACID, PLASMA: Lactic Acid, Venous: 1 mmol/L (ref 0.5–1.9)

## 2020-11-17 LAB — PSA: PSA: 0.42 ng/mL (ref 0.10–4.00)

## 2020-11-17 MED ORDER — IOHEXOL 300 MG/ML  SOLN
75.0000 mL | Freq: Once | INTRAMUSCULAR | Status: AC | PRN
  Administered 2020-11-17: 75 mL via INTRAVENOUS

## 2020-11-17 NOTE — Assessment & Plan Note (Signed)
See swelling left face

## 2020-11-17 NOTE — Addendum Note (Signed)
Addended by: Jacobo Forest on: 11/17/2020 10:09 AM   Modules accepted: Orders

## 2020-11-17 NOTE — Assessment & Plan Note (Signed)
With pain and swelling I suspect may be parotitis as suggested, pt to cont current pain control, finish clindamycin and for CT maxillofacial stat

## 2020-11-17 NOTE — Assessment & Plan Note (Signed)
See neck swelling

## 2020-11-17 NOTE — Progress Notes (Signed)
Patient ID: [name], male   DOB: [birth year], 60 y.o.   MRN: [id number]        Chief Complaint: follow up left neck and face pain and swelling       HPI:  [name] is a 60 y.o. male here with 8 wks onset symptoms; initially he had a root canal that was not "finished" as care was interuppted when he had to attend to family issue in [place]; while there had pain and swelling to the left face and jaw preauricular occur, returned to dentist where seen and tx Twice for this, but now the third time involving neck pain and swelling in the past wk, and dental referred to oral surgeon, who suggested routine films did no show dental origin, but suggested now possible parotitis.  Pt has lost over 30 lbs during the past 8 wk ordeal, pain today about 8/10 and only getting by with his oxycodone, hard to eat, talk, and chew.  Pian and swelling involve left face and neck with some swelling extending to the left supraclavicular area as well.  No recent high fever and chills. Pt not sure of the first 2 antibx courses, but the last tx involves clindamycin 150 qid for which he has maybe 2-3 pills left and finished a medrol pack yesterday as well.  Has not seen ENT, no prior hx of neck abscess or significant abscess.         Wt Readings from Last 3 Encounters:  11/17/20 248 lb (112.5 kg)  11/12/20 256 lb (116.1 kg)  10/28/20 256 lb (116.1 kg)   BP Readings from Last 3 Encounters:  11/17/20 140/80  10/28/20 140/80  10/21/20 140/84         Past Medical History:  Diagnosis Date   Allergy    Anxiety    Arthritis    Back   Chronic back pain    Colon polyps    COPD (chronic obstructive pulmonary disease) (HCC)    Depression    GERD (gastroesophageal reflux disease)    every now and then   Headache    Hyperlipidemia 09/26/2015   Lumbar surgical wound fluid collection    lumbar wound dehiscence   Pneumonia 2020   Past Surgical History:  Procedure Laterality Date   BACK SURGERY      COLONOSCOPY     COLONOSCOPY W/ BIOPSIES AND POLYPECTOMY     EYE SURGERY     KNEE ARTHROSCOPY WITH MEDIAL MENISECTOMY Left 01/23/2016   Procedure: KNEE ARTHROSCOPY WITH MEDIAL MENISECTOMY;  Surgeon: Melrose Nakayama, MD;  Location: [facility];  Service: Orthopedics;  Laterality: Left;   LUMBAR LAMINECTOMY/DECOMPRESSION MICRODISCECTOMY N/A 06/09/2020   Procedure: Decompressive laminectomy Lumbarone -two;  Surgeon: Eustace Moore, MD;  Location: [facility];  Service: Neurosurgery;  Laterality: N/A;   LUMBAR WOUND DEBRIDEMENT N/A 03/21/2014   Procedure: LUMBAR WOUND DEBRIDEMENT;  Surgeon: Eustace Moore, MD;  Location: [facility];  Service: Neurosurgery;  Laterality: N/A;   MAXIMUM ACCESS (MAS)POSTERIOR LUMBAR INTERBODY FUSION (PLIF) 1 LEVEL N/A 02/14/2014   Procedure: FOR MAXIMUM ACCESS SURGERY POSTERIOR LUMBAR INTERBODY FUSION LUMBAR TWO-THREE;  Surgeon: Eustace Moore, MD;  Location: [facility];  Service: Neurosurgery;  Laterality: N/A;   NECK SURGERY     SHOULDER SURGERY     SPINE SURGERY     TONSILLECTOMY      reports that he has been smoking cigarettes. He has a 22.00 pack-year smoking history. His smokeless tobacco use includes  snuff. He reports current alcohol use of about 24.0 standard drinks of alcohol per week. He reports that he does not use drugs. family history includes Aneurysm in his maternal grandfather; Aneurysm (age of onset: 66) in his maternal aunt; Colon cancer in his maternal grandfather; Colon cancer (age of onset: 76) in his father; Colon polyps in his brother; Depression (age of onset: 89) in his brother; Heart disease in his maternal aunt; Hypertension in his mother; Stomach cancer in his father; Stomach cancer (age of onset: 5) in his paternal uncle; Stomach cancer (age of onset: 19) in his paternal uncle; Stomach cancer (age of onset: 64) in his paternal grandfather; Suicidality in his brother. Allergies  Allergen Reactions   Cefuroxime Axetil Hives   Cefuroxime  Axetil Other (See Comments)   Current Outpatient Medications on File Prior to Visit  Medication Sig Dispense Refill   albuterol (VENTOLIN HFA) 108 (90 Base) MCG/ACT inhaler INHALE 1-2 PUFFS INTO THE LUNGS EVERY 6 (SIX) HOURS AS NEEDED FOR WHEEZING OR SHORTNESS OF BREATH. 8.5 each 11   Ascorbic Acid (VITAMIN C) 1000 MG tablet Take 1,000 mg by mouth daily.     atorvastatin (LIPITOR) 10 MG tablet TAKE 1 TABLET BY MOUTH EVERY DAY 90 tablet 0   cholecalciferol (VITAMIN D3) 25 MCG (1000 UNIT) tablet Take 1,000 Units by mouth daily.     clindamycin (CLEOCIN) 150 MG capsule Take by mouth.     doxycycline (VIBRA-TABS) 100 MG tablet Take 1 tablet (100 mg total) by mouth 2 (two) times daily. 20 tablet 0   DULoxetine (CYMBALTA) 60 MG capsule Take 60 mg by mouth at bedtime.  2   gabapentin (NEURONTIN) 300 MG capsule Take 1 capsule (300 mg total) by mouth 3 (three) times daily. (Patient taking differently: Take 300 mg by mouth 4 (four) times daily.) 90 capsule 3   meloxicam (MOBIC) 15 MG tablet Take 1 tablet (15 mg total) by mouth daily. 90 tablet 3   methylPREDNISolone (MEDROL DOSEPAK) 4 MG TBPK tablet Take by mouth.     Multiple Vitamin (MULTIVITAMIN WITH MINERALS) TABS tablet Take 1 tablet by mouth daily.     Oxycodone HCl 10 MG TABS Take 1-2 tablets (10-20 mg total) by mouth every 4 (four) hours as needed (pain). 40 tablet 0   tiZANidine (ZANAFLEX) 4 MG tablet Take 4 mg by mouth 3 (three) times daily.   2   vitamin B-12 (CYANOCOBALAMIN) 1000 MCG tablet Take 1,000 mcg by mouth daily.     benzonatate (TESSALON) 100 MG capsule Take 1 capsule (100 mg total) by mouth 2 (two) times daily as needed for cough. (Patient not taking: Reported on 11/17/2020) 20 capsule 0   No current facility-administered medications on file prior to visit.        ROS:  All others reviewed and negative.  Objective        PE:  BP 140/80 (BP Location: Right Arm, Patient Position: Sitting, Cuff Size: Normal)   Pulse 100   Temp  98.2 F (36.8 C) (Oral)   Ht 5\' 11"  (1.803 m)   Wt 248 lb (112.5 kg)   SpO2 98%   BMI 34.59 kg/m                 Constitutional: Pt appears in NAD               HENT: Head: NCAT.                Right Ear: External ear normal.  Left Ear: External ear normal.                Eyes: . Pupils are equal, round, and reactive to light. Conjunctivae and EOM are normal               Nose: without d/c or deformity               Neck: Neck supple. Gross normal ROM but massive swelling noted to left submadibular to the left supraclavicular area, and lesser swelling and tender noted to the left jaw/parotid area with worst tender possibly just preauricula; has not mastoid tender today; with all this I cannot appreciate a definite fluctuant mass, there is no overlying other skin change and no drainage               Cardiovascular: Normal rate and regular rhythm.                 Pulmonary/Chest: Effort normal and breath sounds without rales or wheezing.                Abd:  Soft, NT, ND, + BS, no organomegaly               Neurological: Pt is alert. At baseline orientation, motor grossly intact               Skin: Skin is warm. No rashes, no other new lesions, LE edema - none               Psychiatric: Pt behavior is normal without agitation   Micro: none  Cardiac tracings I have personally interpreted today:  none  Pertinent Radiological findings (summarize): none   Lab Results  Component Value Date   WBC 12.8 (H) 06/03/2020   HGB 15.3 06/03/2020   HCT 46.0 06/03/2020   PLT 326 06/03/2020   GLUCOSE 122 (H) 06/03/2020   CHOL 156 02/21/2020   TRIG 124.0 02/21/2020   HDL 50.20 02/21/2020   LDLDIRECT 150.1 03/07/2012   LDLCALC 81 02/21/2020   ALT 31 02/21/2020   AST 19 02/21/2020   NA 138 06/03/2020   K 4.7 06/03/2020   CL 104 06/03/2020   CREATININE 0.69 06/03/2020   BUN 11 06/03/2020   CO2 25 06/03/2020   TSH 2.31 02/21/2020   PSA 0.77 02/21/2020   INR 0.9 06/03/2020    HGBA1C 6.4 02/21/2020   Assessment/Plan:  [name] is a 60 y.o. White or Caucasian [1] male with  has a past medical history of Allergy, Anxiety, Arthritis, Chronic back pain, Colon polyps, COPD (chronic obstructive pulmonary disease) ([facility]), Depression, GERD (gastroesophageal reflux disease), Headache, Hyperlipidemia (09/26/2015), Lumbar surgical wound fluid collection, and Pneumonia (2020).  Swelling of left side of face With pain and swelling I suspect may be parotitis as suggested, pt to cont current pain control, finish clindamycin and for CT maxillofacial stat  Left-sided face pain See swelling left face  Neck swelling Very large area diffuse pain and swelling to left submandibular to the left supraclavicular without definite mass or abscess and no overlying skin change, but will need r/o abscess with CT Neck w CM; and other tx as above  Neck pain on left side See neck swelling  Followup: No follow-ups on file.  Cathlean Cower, MD 11/17/2020 10:01 AM [facility]

## 2020-11-17 NOTE — ED Provider Notes (Signed)
Emergency Medicine Provider Triage Evaluation Note  [name] , a 60 y.o. male  was evaluated in triage.  Pt complains of left-sided facial edema x3 months. Symptoms started after a root canal that wasn't "finished". He states he has had intermittent edema treated with oral antibiotics and IV antibiotics last week with no improvement in symptoms. Patient was evaluated at his PCP today where a CT scan was performed which demonstrates: "Osteomyelitis of the left mandible affecting the ramus, coronoid process and angle of the mandible. Surrounding low-density material could be phlegmonous inflammation or frank abscess and involves the muscles of mastication." Patient was sent to the ED for IV antibiotics. Patient denies fever and chills. Admits to some trismus. No difficulties breathing.   Review of Systems  Positive: Trismus, facial edema Negative: fever  Physical Exam  BP 124/84 (BP Location: Left Arm)   Pulse 85   Temp 98.4 F (36.9 C) (Oral)   Resp 17   Ht 5\' 11"  (1.803 m)   Wt 111.6 kg   SpO2 98%   BMI 34.31 kg/m  Gen:   Awake, no distress   Resp:  Normal effort  MSK:   Moves extremities without difficulty  Other:  Left-sided facial edema. 2 finger trismus  Medical Decision Making  Medically screening exam initiated at 4:37 PM.  Appropriate orders placed.  [name] was informed that the remainder of the evaluation will be completed by another provider, this initial triage assessment does not replace that evaluation, and the importance of remaining in the ED until their evaluation is complete.  Labs ordered COVID test for admission.   Suzy Bouchard, PA-C 11/17/20 1641    Charlesetta Shanks, MD 11/19/20 [phone]

## 2020-11-17 NOTE — Patient Instructions (Signed)
Please continue all other medications as before, and refills have been done if requested.  Please have the pharmacy call with any other refills you may need.  Please keep your appointments with your specialists as you may have planned  You will be contacted regarding the referral for: CT scan for face and neck - stat - to see [facility] now  Please go to the LAB at the blood drawing area for the tests to be done now, as needs to be done prior to any contrast given with CT scan

## 2020-11-17 NOTE — Assessment & Plan Note (Signed)
Very large area diffuse pain and swelling to left submandibular to the left supraclavicular without definite mass or abscess and no overlying skin change, but will need r/o abscess with CT Neck w CM; and other tx as above

## 2020-11-17 NOTE — ED Triage Notes (Signed)
C/O jaw and tooth ache

## 2020-11-17 NOTE — Telephone Encounter (Signed)
Spoke to pt and wife by phone  CT scan with +osteomyelitis and possible abscess  Pt advised to go to ED

## 2020-11-18 ENCOUNTER — Inpatient Hospital Stay (HOSPITAL_COMMUNITY): Payer: Medicare Other

## 2020-11-18 ENCOUNTER — Encounter (HOSPITAL_COMMUNITY): Payer: Self-pay | Admitting: Internal Medicine

## 2020-11-18 DIAGNOSIS — D72829 Elevated white blood cell count, unspecified: Secondary | ICD-10-CM | POA: Diagnosis present

## 2020-11-18 DIAGNOSIS — M6008 Infective myositis, other site: Secondary | ICD-10-CM | POA: Diagnosis present

## 2020-11-18 DIAGNOSIS — Z818 Family history of other mental and behavioral disorders: Secondary | ICD-10-CM | POA: Diagnosis not present

## 2020-11-18 DIAGNOSIS — E785 Hyperlipidemia, unspecified: Secondary | ICD-10-CM | POA: Diagnosis present

## 2020-11-18 DIAGNOSIS — M1990 Unspecified osteoarthritis, unspecified site: Secondary | ICD-10-CM | POA: Diagnosis present

## 2020-11-18 DIAGNOSIS — R059 Cough, unspecified: Secondary | ICD-10-CM | POA: Diagnosis not present

## 2020-11-18 DIAGNOSIS — J449 Chronic obstructive pulmonary disease, unspecified: Secondary | ICD-10-CM

## 2020-11-18 DIAGNOSIS — I471 Supraventricular tachycardia: Secondary | ICD-10-CM | POA: Diagnosis present

## 2020-11-18 DIAGNOSIS — M542 Cervicalgia: Secondary | ICD-10-CM

## 2020-11-18 DIAGNOSIS — I82619 Acute embolism and thrombosis of superficial veins of unspecified upper extremity: Secondary | ICD-10-CM | POA: Diagnosis not present

## 2020-11-18 DIAGNOSIS — Z981 Arthrodesis status: Secondary | ICD-10-CM | POA: Diagnosis not present

## 2020-11-18 DIAGNOSIS — K219 Gastro-esophageal reflux disease without esophagitis: Secondary | ICD-10-CM | POA: Diagnosis present

## 2020-11-18 DIAGNOSIS — Z888 Allergy status to other drugs, medicaments and biological substances status: Secondary | ICD-10-CM | POA: Diagnosis not present

## 2020-11-18 DIAGNOSIS — R7401 Elevation of levels of liver transaminase levels: Secondary | ICD-10-CM | POA: Diagnosis present

## 2020-11-18 DIAGNOSIS — J209 Acute bronchitis, unspecified: Secondary | ICD-10-CM | POA: Diagnosis present

## 2020-11-18 DIAGNOSIS — M272 Inflammatory conditions of jaws: Secondary | ICD-10-CM | POA: Diagnosis present

## 2020-11-18 DIAGNOSIS — E43 Unspecified severe protein-calorie malnutrition: Secondary | ICD-10-CM | POA: Diagnosis not present

## 2020-11-18 DIAGNOSIS — Z20822 Contact with and (suspected) exposure to covid-19: Secondary | ICD-10-CM | POA: Diagnosis present

## 2020-11-18 DIAGNOSIS — R634 Abnormal weight loss: Secondary | ICD-10-CM | POA: Diagnosis present

## 2020-11-18 DIAGNOSIS — Z720 Tobacco use: Secondary | ICD-10-CM | POA: Diagnosis not present

## 2020-11-18 DIAGNOSIS — M549 Dorsalgia, unspecified: Secondary | ICD-10-CM | POA: Diagnosis present

## 2020-11-18 DIAGNOSIS — M5416 Radiculopathy, lumbar region: Secondary | ICD-10-CM | POA: Diagnosis present

## 2020-11-18 DIAGNOSIS — J440 Chronic obstructive pulmonary disease with acute lower respiratory infection: Secondary | ICD-10-CM | POA: Diagnosis present

## 2020-11-18 DIAGNOSIS — J42 Unspecified chronic bronchitis: Secondary | ICD-10-CM | POA: Diagnosis not present

## 2020-11-18 DIAGNOSIS — Z6834 Body mass index (BMI) 34.0-34.9, adult: Secondary | ICD-10-CM | POA: Diagnosis not present

## 2020-11-18 DIAGNOSIS — R7989 Other specified abnormal findings of blood chemistry: Secondary | ICD-10-CM | POA: Diagnosis present

## 2020-11-18 DIAGNOSIS — F17210 Nicotine dependence, cigarettes, uncomplicated: Secondary | ICD-10-CM | POA: Diagnosis present

## 2020-11-18 DIAGNOSIS — I82612 Acute embolism and thrombosis of superficial veins of left upper extremity: Secondary | ICD-10-CM | POA: Diagnosis present

## 2020-11-18 DIAGNOSIS — M7989 Other specified soft tissue disorders: Secondary | ICD-10-CM | POA: Diagnosis not present

## 2020-11-18 DIAGNOSIS — Z800 Family history of malignant neoplasm of digestive organs: Secondary | ICD-10-CM | POA: Diagnosis not present

## 2020-11-18 DIAGNOSIS — E669 Obesity, unspecified: Secondary | ICD-10-CM | POA: Diagnosis present

## 2020-11-18 HISTORY — PX: IR US GUIDE BX ASP/DRAIN: IMG2392

## 2020-11-18 LAB — RESP PANEL BY RT-PCR (FLU A&B, COVID) ARPGX2
Influenza A by PCR: NEGATIVE
Influenza B by PCR: NEGATIVE
SARS Coronavirus 2 by RT PCR: NEGATIVE

## 2020-11-18 LAB — B. BURGDORFI ANTIBODIES: B burgdorferi Ab IgG+IgM: <0.9 index

## 2020-11-18 LAB — HIV ANTIBODY (ROUTINE TESTING W REFLEX): HIV Screen 4th Generation wRfx: NONREACTIVE

## 2020-11-18 MED ORDER — LORAZEPAM 2 MG/ML IJ SOLN: 1.0000 mg | INTRAMUSCULAR | Status: DC | PRN

## 2020-11-18 MED ORDER — SODIUM CHLORIDE 0.9 % IV SOLN
3.0000 g | Freq: Once | INTRAVENOUS | Status: AC
  Administered 2020-11-18: 3 g via INTRAVENOUS
  Filled 2020-11-18: qty 8

## 2020-11-18 MED ORDER — MIDAZOLAM HCL 2 MG/2ML IJ SOLN
INTRAMUSCULAR | Status: AC
  Filled 2020-11-18: qty 2

## 2020-11-18 MED ORDER — ONDANSETRON HCL 4 MG PO TABS: 4.0000 mg | ORAL_TABLET | Freq: Four times a day (QID) | ORAL | Status: DC | PRN

## 2020-11-18 MED ORDER — THIAMINE HCL 100 MG/ML IJ SOLN: 100.0000 mg | Freq: Every day | INTRAMUSCULAR | Status: DC

## 2020-11-18 MED ORDER — MIDAZOLAM HCL 2 MG/2ML IJ SOLN
INTRAMUSCULAR | Status: AC | PRN
  Administered 2020-11-18 (×2): 1 mg via INTRAVENOUS

## 2020-11-18 MED ORDER — SODIUM CHLORIDE 0.9 % IV BOLUS
1000.0000 mL | Freq: Once | INTRAVENOUS | Status: AC
  Administered 2020-11-18: 1000 mL via INTRAVENOUS

## 2020-11-18 MED ORDER — LIDOCAINE HCL 1 % IJ SOLN
INTRAMUSCULAR | Status: AC | PRN
  Administered 2020-11-18: 5 mL

## 2020-11-18 MED ORDER — OXYCODONE HCL 10 MG PO TABS: 10.0000 mg | ORAL_TABLET | ORAL | Status: DC | PRN

## 2020-11-18 MED ORDER — SODIUM CHLORIDE 0.9 % IV SOLN: INTRAVENOUS | Status: AC

## 2020-11-18 MED ORDER — MORPHINE SULFATE (PF) 2 MG/ML IV SOLN
2.0000 mg | INTRAVENOUS | Status: DC | PRN
Start: 2020-11-18 — End: 2020-11-20
  Administered 2020-11-18 – 2020-11-19 (×10): 2 mg via INTRAVENOUS
  Filled 2020-11-18 (×10): qty 1

## 2020-11-18 MED ORDER — LIDOCAINE HCL 1 % IJ SOLN
INTRAMUSCULAR | Status: AC
  Filled 2020-11-18: qty 20

## 2020-11-18 MED ORDER — ACETAMINOPHEN 650 MG RE SUPP: 650.0000 mg | Freq: Four times a day (QID) | RECTAL | Status: DC | PRN

## 2020-11-18 MED ORDER — ADULT MULTIVITAMIN W/MINERALS CH
1.0000 | ORAL_TABLET | Freq: Every day | ORAL | Status: DC
  Administered 2020-11-19 – 2020-11-20 (×2): 1 via ORAL
  Filled 2020-11-18 (×2): qty 1

## 2020-11-18 MED ORDER — TIZANIDINE HCL 4 MG PO TABS
4.0000 mg | ORAL_TABLET | Freq: Three times a day (TID) | ORAL | Status: DC | PRN
  Administered 2020-11-19 – 2020-11-20 (×2): 4 mg via ORAL
  Filled 2020-11-18 (×3): qty 1

## 2020-11-18 MED ORDER — TIZANIDINE HCL 4 MG PO TABS: 4.0000 mg | ORAL_TABLET | Freq: Three times a day (TID) | ORAL | Status: DC

## 2020-11-18 MED ORDER — FOLIC ACID 1 MG PO TABS
1.0000 mg | ORAL_TABLET | Freq: Every day | ORAL | Status: DC
  Administered 2020-11-19 – 2020-11-20 (×2): 1 mg via ORAL
  Filled 2020-11-18 (×2): qty 1

## 2020-11-18 MED ORDER — FENTANYL CITRATE (PF) 100 MCG/2ML IJ SOLN
INTRAMUSCULAR | Status: AC | PRN
  Administered 2020-11-18 (×2): 50 ug via INTRAVENOUS

## 2020-11-18 MED ORDER — GUAIFENESIN ER 600 MG PO TB12
600.0000 mg | ORAL_TABLET | Freq: Two times a day (BID) | ORAL | Status: DC
  Administered 2020-11-18 – 2020-11-20 (×4): 600 mg via ORAL
  Filled 2020-11-18 (×4): qty 1

## 2020-11-18 MED ORDER — ACETAMINOPHEN 325 MG PO TABS: 650.0000 mg | ORAL_TABLET | Freq: Four times a day (QID) | ORAL | Status: DC | PRN

## 2020-11-18 MED ORDER — NICOTINE 14 MG/24HR TD PT24
14.0000 mg | MEDICATED_PATCH | Freq: Every day | TRANSDERMAL | Status: DC
  Filled 2020-11-18 (×3): qty 1

## 2020-11-18 MED ORDER — ONDANSETRON HCL 4 MG/2ML IJ SOLN: 4.0000 mg | Freq: Four times a day (QID) | INTRAMUSCULAR | Status: DC | PRN

## 2020-11-18 MED ORDER — FENTANYL CITRATE (PF) 100 MCG/2ML IJ SOLN
INTRAMUSCULAR | Status: AC
  Filled 2020-11-18: qty 2

## 2020-11-18 MED ORDER — THIAMINE HCL 100 MG PO TABS
100.0000 mg | ORAL_TABLET | Freq: Every day | ORAL | Status: DC
  Administered 2020-11-19 – 2020-11-20 (×2): 100 mg via ORAL
  Filled 2020-11-18 (×2): qty 1

## 2020-11-18 MED ORDER — MORPHINE SULFATE (PF) 4 MG/ML IV SOLN
4.0000 mg | Freq: Once | INTRAVENOUS | Status: AC
  Administered 2020-11-18: 4 mg via INTRAVENOUS
  Filled 2020-11-18: qty 1

## 2020-11-18 MED ORDER — ENOXAPARIN SODIUM 40 MG/0.4ML IJ SOSY
40.0000 mg | PREFILLED_SYRINGE | Freq: Every day | INTRAMUSCULAR | Status: DC
  Administered 2020-11-18 – 2020-11-20 (×3): 40 mg via SUBCUTANEOUS
  Filled 2020-11-18 (×3): qty 0.4

## 2020-11-18 MED ORDER — OXYCODONE HCL 5 MG PO TABS
10.0000 mg | ORAL_TABLET | Freq: Four times a day (QID) | ORAL | Status: DC | PRN
  Administered 2020-11-19 – 2020-11-20 (×5): 10 mg via ORAL
  Filled 2020-11-18 (×6): qty 2

## 2020-11-18 MED ORDER — SODIUM CHLORIDE 0.9% FLUSH
3.0000 mL | Freq: Two times a day (BID) | INTRAVENOUS | Status: DC
  Administered 2020-11-19: 3 mL via INTRAVENOUS

## 2020-11-18 MED ORDER — SODIUM CHLORIDE 0.9 % IV SOLN
3.0000 g | Freq: Four times a day (QID) | INTRAVENOUS | Status: DC
  Administered 2020-11-18 – 2020-11-19 (×3): 3 g via INTRAVENOUS
  Filled 2020-11-18 (×2): qty 8
  Filled 2020-11-18: qty 3

## 2020-11-18 MED ORDER — LORAZEPAM 1 MG PO TABS: 1.0000 mg | ORAL_TABLET | ORAL | Status: DC | PRN

## 2020-11-18 NOTE — H&P (Signed)
History and Physical    [name] DJM:[id number] DOB: 1961/03/25 DOA: 11/17/2020  Referring MD/NP/PA: Roderic Palau, MD PCP: [name], MD  Patient coming from: Home  Chief Complaint: Left jaw pain and swelling  I have personally briefly reviewed patient's old medical records in [place]   HPI: [name] is a 60 y.o. male with medical history significant of COPD, hyperlipidemia, GERD, and arthritis presents with complaints of left jaw swelling and pain.  Symptoms initially started 3 months ago almost immediately after he had a partial root canal and 2 crowns placed by his dentist.  He reports having difficult time opening his mouth and chewing food.  Patient reports that he had swelling and pain in his left jaw.  He was treated with oral antibiotics in May by his dentist with temporary improvement symptoms of swelling and pain.  His dentist had referred him to a oral surgeon whom he was  seen by last week.   At that time he had been prescribed clindamycin and a steroid Dosepak.  He reports having some temporary improvement, but since that time has had recurrence of swelling and pain.  He has been unable to eat anything, but liquids for the most part due to pain and ability to chew  Maxillofacial CT scan was obtained yesterday showing osteomyelitis of the left mandible with concern for phlegmon mass inflammation or flank abscess involving muscles of mastication.  ED Course: Upon admission to the emergency department patient was seen to be afebrile with vital signs relatively within normal limits.  Labs significant for WBC 18.5 and lactic acid 1.  Patient had been given 1 L of normal saline IV fluids, morphine, and started on Unasyn after talks with infectious disease.  The ED provider had made a call out to the patient's oral surgeon, Dr. Kathrynn Speed with [facility].  Review of Systems  Constitutional:  Positive for weight loss.  HENT:         Positive for jaw  pain  Eyes:  Negative for photophobia and pain.  Respiratory:  Negative for cough, shortness of breath and stridor.   Cardiovascular:  Negative for chest pain and leg swelling.  Gastrointestinal:  Negative for abdominal pain, diarrhea, nausea and vomiting.  Genitourinary:  Negative for dysuria.  Musculoskeletal:  Negative for falls.  Neurological:  Negative for focal weakness and loss of consciousness.   Past Medical History:  Diagnosis Date   Allergy    Anxiety    Arthritis    Back   Chronic back pain    Colon polyps    COPD (chronic obstructive pulmonary disease) (HCC)    Depression    GERD (gastroesophageal reflux disease)    every now and then   Headache    Hyperlipidemia 09/26/2015   Lumbar surgical wound fluid collection    lumbar wound dehiscence   Pneumonia 2020    Past Surgical History:  Procedure Laterality Date   BACK SURGERY     COLONOSCOPY     COLONOSCOPY W/ BIOPSIES AND POLYPECTOMY     EYE SURGERY     KNEE ARTHROSCOPY WITH MEDIAL MENISECTOMY Left 01/23/2016   Procedure: KNEE ARTHROSCOPY WITH MEDIAL MENISECTOMY;  Surgeon: Melrose Nakayama, MD;  Location: [facility];  Service: Orthopedics;  Laterality: Left;   LUMBAR LAMINECTOMY/DECOMPRESSION MICRODISCECTOMY N/A 06/09/2020   Procedure: Decompressive laminectomy Lumbarone -two;  Surgeon: Eustace Moore, MD;  Location: [facility];  Service: Neurosurgery;  Laterality: N/A;   LUMBAR WOUND DEBRIDEMENT  N/A 03/21/2014   Procedure: LUMBAR WOUND DEBRIDEMENT;  Surgeon: Eustace Moore, MD;  Location: [facility];  Service: Neurosurgery;  Laterality: N/A;   MAXIMUM ACCESS (MAS)POSTERIOR LUMBAR INTERBODY FUSION (PLIF) 1 LEVEL N/A 02/14/2014   Procedure: FOR MAXIMUM ACCESS SURGERY POSTERIOR LUMBAR INTERBODY FUSION LUMBAR TWO-THREE;  Surgeon: Eustace Moore, MD;  Location: [facility];  Service: Neurosurgery;  Laterality: N/A;   NECK SURGERY     SHOULDER SURGERY     SPINE SURGERY     TONSILLECTOMY       reports that  he has been smoking cigarettes. He has a 22.00 pack-year smoking history. His smokeless tobacco use includes snuff. He reports current alcohol use of about 24.0 standard drinks of alcohol per week. He reports that he does not use drugs.  Allergies  Allergen Reactions   Cefuroxime Axetil Hives   Cefuroxime Axetil Other (See Comments)    Family History  Problem Relation Age of Onset   Colon cancer Father 8       pt thinks father had colon cancer   Stomach cancer Father    Hypertension Mother    Colon polyps Brother    Aneurysm Maternal Aunt 108       brain   Heart disease Maternal Aunt        d 30   Stomach cancer Paternal Uncle 39   Aneurysm Maternal Grandfather        stomach   Colon cancer Maternal Grandfather        thinks grandfather had colon cancer   Stomach cancer Paternal Grandfather 39   Depression Brother 11   Suicidality Brother    Stomach cancer Paternal Uncle 9   Esophageal cancer Neg Hx    Rectal cancer Neg Hx    Diabetes Neg Hx     Prior to Admission medications   Medication Sig Start Date End Date Taking? Authorizing Provider  albuterol (VENTOLIN HFA) 108 (90 Base) MCG/ACT inhaler INHALE 1-2 PUFFS INTO THE LUNGS EVERY 6 (SIX) HOURS AS NEEDED FOR WHEEZING OR SHORTNESS OF BREATH. 08/19/20   [name], MD  Ascorbic Acid (VITAMIN C) 1000 MG tablet Take 1,000 mg by mouth daily.    [provider]  atorvastatin (LIPITOR) 10 MG tablet TAKE 1 TABLET BY MOUTH EVERY DAY 09/19/20   [name], MD  benzonatate (TESSALON) 100 MG capsule Take 1 capsule (100 mg total) by mouth 2 (two) times daily as needed for cough. Patient not taking: Reported on 11/17/2020 09/23/20   Lucretia Kern, DO  cholecalciferol (VITAMIN D3) 25 MCG (1000 UNIT) tablet Take 1,000 Units by mouth daily.    [provider]  clindamycin (CLEOCIN) 150 MG capsule Take by mouth. 11/11/20   [provider]  doxycycline (VIBRA-TABS) 100 MG tablet Take 1 tablet (100 mg total) by  mouth 2 (two) times daily. 10/21/20   [name], MD  DULoxetine (CYMBALTA) 60 MG capsule Take 60 mg by mouth at bedtime. 11/05/14   [provider]  gabapentin (NEURONTIN) 300 MG capsule Take 1 capsule (300 mg total) by mouth 3 (three) times daily. Patient taking differently: Take 300 mg by mouth 4 (four) times daily. 04/21/17   Rosemarie Ax, MD  meloxicam (MOBIC) 15 MG tablet Take 1 tablet (15 mg total) by mouth daily. 09/27/18   [name], MD  methylPREDNISolone (MEDROL DOSEPAK) 4 MG TBPK tablet Take by mouth. 11/11/20   [provider]  Multiple Vitamin (MULTIVITAMIN WITH  MINERALS) TABS tablet Take 1 tablet by mouth daily.    [provider]  Oxycodone HCl 10 MG TABS Take 1-2 tablets (10-20 mg total) by mouth every 4 (four) hours as needed (pain). 06/10/20   Eustace Moore, MD  tiZANidine (ZANAFLEX) 4 MG tablet Take 4 mg by mouth 3 (three) times daily.  11/05/14   [provider]  vitamin B-12 (CYANOCOBALAMIN) 1000 MCG tablet Take 1,000 mcg by mouth daily.    [provider]    Physical Exam:  Constitutional: Middle-age male Vitals:   11/18/20 0114 11/18/20 0340 11/18/20 0817 11/18/20 0945  BP: (!) 147/96 (!) 153/92 (!) 142/95 (!) 147/83  Pulse: 85 79 80 83  Resp: 14 14 16 15   Temp: 99.5 F (37.5 C) 99 F (37.2 C) 98.3 F (36.8 C)   TempSrc: Oral Oral    SpO2: 97% 96% 97% 99%  Weight:      Height:       Eyes: PERRL, lids and conjunctivae normal ENMT: Swelling on the left side of jaw with tenderness to palpation and decreased ability to open mouth.   Neck: Left-sided neck swelling with submandibular lymphadenopathy appreciated Respiratory: Respiratory effort with mild wheeze appreciated on physical exam. Cardiovascular: Regular rate and rhythm, no murmurs / rubs / gallops. No extremity edema. 2+ pedal pulses. No carotid bruits.  Abdomen: no tenderness, no masses palpated. No hepatosplenomegaly. Bowel sounds positive.   Musculoskeletal: no clubbing / cyanosis. No joint deformity upper and lower extremities. Good ROM, no contractures. Normal muscle tone.  Skin: Warm and dry Neurologic: CN 2-12 grossly intact. Sensation intact, DTR normal. Strength 5/5 in all 4.  Psychiatric: Normal judgment and insight. Alert and oriented x 3. Normal mood.     Labs on Admission: I have personally reviewed following labs and imaging studies  CBC: Recent Labs  Lab 11/17/20 1009 11/17/20 1639  WBC 17.9* 18.5*  NEUTROABS 15.2* 13.4*  HGB 14.6 14.9  HCT 43.2 45.8  MCV 90.3 94.2  PLT 322.0 703   Basic Metabolic Panel: Recent Labs  Lab 11/17/20 1009 11/17/20 1639  NA 137 134*  K 4.5 4.2  CL 101 98  CO2 26 27  GLUCOSE 107* 70  BUN 16 17  CREATININE 0.64 0.74  CALCIUM 9.2 9.5   GFR: Estimated Creatinine Clearance: 127.8 mL/min (by C-G formula based on SCr of 0.74 mg/dL). Liver Function Tests: Recent Labs  Lab 11/17/20 1009  AST 21  ALT 58*  ALKPHOS 92  BILITOT 0.8  PROT 6.6  ALBUMIN 4.1   No results for input(s): LIPASE, AMYLASE in the last 168 hours. No results for input(s): AMMONIA in the last 168 hours. Coagulation Profile: Recent Labs  Lab 11/17/20 1009  INR 1.0   Cardiac Enzymes: No results for input(s): CKTOTAL, CKMB, CKMBINDEX, TROPONINI in the last 168 hours. BNP (last 3 results) No results for input(s): PROBNP in the last 8760 hours. HbA1C: Recent Labs    11/17/20 1009  HGBA1C 6.5   CBG: No results for input(s): GLUCAP in the last 168 hours. Lipid Profile: Recent Labs    11/17/20 1009  CHOL 133  HDL 50.50  LDLCALC 65  TRIG 85.0  CHOLHDL 3   Thyroid Function Tests: Recent Labs    11/17/20 1009  TSH 1.22   Anemia Panel: Recent Labs    11/17/20 1009  VITAMINB12 423   Urine analysis:    Component Value Date/Time   COLORURINE YELLOW 11/17/2020 [place] 11/17/2020 1009  LABSPEC 1.025 11/17/2020 1009   PHURINE 6.0 11/17/2020 1009   GLUCOSEU  NEGATIVE 11/17/2020 1009   HGBUR SMALL (A) 11/17/2020 1009   BILIRUBINUR SMALL (A) 11/17/2020 1009   BILIRUBINUR Neg 07/12/2013 1643   KETONESUR TRACE (A) 11/17/2020 1009   PROTEINUR Neg 07/12/2013 1643   UROBILINOGEN 1.0 11/17/2020 1009   NITRITE NEGATIVE 11/17/2020 1009   LEUKOCYTESUR NEGATIVE 11/17/2020 1009   Sepsis Labs: No results found for this or any previous visit (from the past 240 hour(s)).   Radiological Exams on Admission: CT Soft Tissue Neck W Contrast  Result Date: 11/17/2020 CLINICAL DATA:  Swelling of the left face. Previous dental infections treated with antibiotics. EXAM: CT NECK WITH CONTRAST TECHNIQUE: Multidetector CT imaging of the neck was performed using the standard protocol following the bolus administration of intravenous contrast. CONTRAST:  12mL OMNIPAQUE IOHEXOL 300 MG/ML  SOLN COMPARISON:  Maxillofacial CT same day. FINDINGS: Pharynx and larynx: No mucosal or submucosal lesion. Salivary glands: Parotid and submandibular glands are normal. Thyroid: Normal Lymph nodes: No enlarged or low-density nodes on either side of the neck Vascular: No vascular pathology. Limited intracranial: Normal Visualized orbits: Normal Mastoids and visualized paranasal sinuses: Clear Skeleton: As noted at the maxillofacial CT, there is osteomyelitis of the left mandibular ramus, coronoid process and angle with surrounding phlegmonous inflammation or abscess affecting the muscles of mastication. See that report for complete description. Elsewhere in the neck, there has been distant ACDF C5 through C7 with solid union. Some adjacent segment degenerative changes noted at C4-5 and C7-T1 Upper chest: Negative Other: Fluid in the mastoid air cells on the left. IMPRESSION: See results of maxillofacial CT. Osteomyelitis of the left mandible with surrounding phlegmonous inflammation or abscess on both sides affecting the muscles of mastication. Otherwise no soft tissue lesion seen in the neck. No  evidence of deep space extension of the infection. Left mastoid effusion. Electronically Signed   By: Nelson Chimes M.D.   On: 11/17/2020 11:53   CT Maxillofacial W/Cm  Result Date: 11/17/2020 CLINICAL DATA:  Maxillofacial pain and swelling on the left over the last 2-3 months. Symptoms began after dental work and improved after antibiotics. Question parotid disease. EXAM: CT MAXILLOFACIAL WITH CONTRAST TECHNIQUE: Multidetector CT imaging of the maxillofacial structures was performed with intravenous contrast. Multiplanar CT image reconstructions were also generated. CONTRAST:  67mL OMNIPAQUE IOHEXOL 300 MG/ML  SOLN COMPARISON:  None. FINDINGS: There is rarefaction of the left mandible affecting the ramus, coronoid process and angle of the mandible consistent with osteomyelitis. No frank bone destruction. There is low-density of the soft tissues on both sides of the mandible, with involvement of the muscles of mastication. This could be due to phlegmonous inflammation or frank abscess/myositis. This inflammatory low-density does not appear to track into any other deep space. Elsewhere, the actual dental work of left mandible appears the consist of a root canal the last molar in a crown of the tooth adjacent to that. There is no evidence of root abscess at those levels. Both parotid glands are normal. Both submandibular glands are normal. No evidence of regional adenopathy. IMPRESSION: Osteomyelitis of the left mandible affecting the ramus, coronoid process and angle of the mandible. Surrounding low-density material could be phlegmonous inflammation or frank abscess and involves the muscles of mastication. Electronically Signed   By: Nelson Chimes M.D.   On: 11/17/2020 11:50     Assessment/Plan Osteomyelitis of the left mandible suspected abscess: Acute.  Patient presents with complaints of left-sided facial swelling.  Maxillofacial CT noted osteomyelitis of the left mandible with surrounding low-density  material concerning for possible abscess. -Admit to a MedSurg bed -N.p.o. until able possible IR drainage -Continue Unasyn per pharmacy -Morphine IV as needed for pain -Normal saline IV fluids at 75 mL/h -IR consulted for possible aspiration and culture -Appreciate ID consultative services, we will follow-up for any further recommendation  Leukocytosis: Acute.  WBC elevated up to 18.5.  Patient did not meet SIRS/sepsis criteria, but suspect elevation due to above. -Recheck CBC tomorrow morning  Elevated ALT: Acute.  Labs obtained prior to admission patient was noted to have elevated ALT of 58. -Recheck CMP tomorrow morning  COPD with bronchitis: Acute.  Patient was noted to have some mild expiratory wheezing on physical exam.  He had recently just gotten finished with a Solu-Medrol Dosepak. -Albuterol nebs as needed -Mucinex  Alcohol use: Patient reports drinking a 12 beers /week (documented to reported 24 beers/wk), but denies any prior history of withdrawal.  Patient without active signs for withdrawal currently. -CIWA protocols with Ativan as needed -Consider need to add on scheduled Ativan  if needed  Tobacco abuse: Patient reports smoking half pack cigarettes per day on average as well as dipping tobacco. -Continue counseling on the need of cessation of tobacco use -Nicotine patch offered  DVT prophylaxis: Lovenox Code Status: Full Family Communication: Wife updated at bedside Disposition Plan: Home Consults called: ID, IR Admission status: Inpatient require more than 2 midnight stay  Norval Morton MD [facility]   If 7PM-7AM, please contact night-coverage   11/18/2020, 9:55 AM

## 2020-11-18 NOTE — Progress Notes (Signed)
Patient left floor awake,alert/orientedx4 with transport to IR.

## 2020-11-18 NOTE — Consult Note (Signed)
[facility]    Date of Admission:  11/17/2020      Total days of antibiotics 0         Reason for Consult: Mandibular osteomyelitis     Referring Provider: Tamala Julian  Primary Care Provider: [name], MD   Assessment: [name] is a 60 y.o. male admitted with acute on chronic pain and swelling of the left neck/jaw, + trismus. MRI read with concerning changes indicating osteomyelitis of the ramus bone, mandible with surrounding inflammation due to either phlegmonous debris or abscess surrounding masticator muscles. He will likely require surgery intervention to clear this infection paired with long term IV antibiotic treatment. Will wait for surgery assessment and plan. Would recommend treatment with IV unasyn to target typical oral flora including anaerobes. Doubt we need MRSA/E coverage here but will follow operative cultures and adjust if indicated.   New cough in setting of chronic dental infection = will check CXR to ensure no bilateral process noted that could be of concern for Lemierre's disease in this setting. Flu A/B and COVID negative. He is non-hypoxic, not coughing on exam and appears to be breathing well during our visit. Upon review of chart this has been what appears to be a more chronic problem that waxes and wanes. ?seasonal allergies vs gerd component.  May consider transthoracic echo and vascular ultrasound of left neck vessels. Contrasted neck CT with normal appearing vascular component.   Elevated LFTs = history of alcohol use, unclear if he has had withdrawal before but would need to watch for that. ALT slightly elevated c/w this. Will screen hepatitis C for routine health maintenance if not already done. Hep B serology also to see if he needs booster vaccines.   COVID Vaccine = he has yet to received his 4th dose of Moderna as recommended under current guidelines. Would like to arrange prior to discharge if possible.     Plan: 2-view CXR now for cough eval Unasyn IV  Follow for surgery recommendations  Hep b/c blood work if not already done.  D/C isolation with negative flu/covid     Principal Problem:   Acute osteomyelitis of mandible Active Problems:   Neck pain on left side   Cough    enoxaparin (LOVENOX) injection  40 mg Subcutaneous Daily   sodium chloride flush  3 mL Intravenous Q12H    HPI: [name] is a 60 y.o. male admitted from home for chronic swelling/pain in the left jaw.   His wife is in the room and supplements parts of the history.   [name] states this started between 2-3 months ago now after he had a routine partial root canal with outpatient dentist. Shortly after this procedure he had swelling and pain in the left jaw that was described to be the most severe pain over the course of this entire illness. He did not have any drainage or fevers at that time but has had a hard time opening his mouth to chew food comfortably. He went back to his dentist and was prescribed a course of antibiotics (amoxicillin + doxy May 2022) with good response and reduced swelling and pain. However this has since flared up to a lesser degree since that time. Re-treated treated with course of clindamycin with initial improvement, then return of symptoms again. This now marks the 3rd flare up of this infection. He has lost 40 lbs over the whole course of illness  and cannot open his jaw much due to pain and restriction. Denies any neck pain with rotation but does have some swelling at the neck vessels that is a little tender. No fevers/chills or systemic symptoms. He has noticed a cough over the last 3 weeks but no significant chest pains associated with it and wonders if the antibiotics are causing him harm.   Lives with his wife. Does use tobacco daily (cigarettes and snuff). Does drink alcohol up to 24 standard drinks a week, socially.    History of allergy to cefuroxime described in  2002. He experienced same day hives, itching and swelling to where he sought care in Urgent care and was sen to the hospital for management of allergic reaction. He has not had further cephalosporin derivatives since to his knowledge.    Review of Systems: Review of Systems  Constitutional:  Positive for weight loss. Negative for chills, fever and malaise/fatigue.  HENT:  Negative for congestion, sinus pain and sore throat.   Eyes:  Negative for blurred vision.  Respiratory:  Positive for cough. Negative for sputum production, shortness of breath and wheezing.   Cardiovascular:  Negative for chest pain and leg swelling.  Gastrointestinal:  Negative for abdominal pain, diarrhea and vomiting.  Genitourinary:  Negative for dysuria and flank pain.  Musculoskeletal:  Negative for joint pain and myalgias. Neck pain: anterolateral left neck vessels with tenderness and swelling. Skin:  Negative for rash.  Neurological:  Negative for dizziness, tingling and headaches.  Psychiatric/Behavioral:  Negative for depression and substance abuse. The patient is not nervous/anxious and does not have insomnia.     Past Medical History:  Diagnosis Date   Allergy    Anxiety    Arthritis    Back   Chronic back pain    Colon polyps    COPD (chronic obstructive pulmonary disease) (HCC)    Depression    GERD (gastroesophageal reflux disease)    every now and then   Headache    Hyperlipidemia 09/26/2015   Lumbar surgical wound fluid collection    lumbar wound dehiscence   Pneumonia 2020    Social History   Tobacco Use   Smoking status: Every Day    Packs/day: 0.50    Years: 44.00    Pack years: 22.00    Types: Cigarettes   Smokeless tobacco: Current    Types: Snuff  Vaping Use   Vaping Use: Never used  Substance Use Topics   Alcohol use: Yes    Alcohol/week: 24.0 standard drinks    Types: 24 Standard drinks or equivalent per week    Comment: social beer   Drug use: No    Family History   Problem Relation Age of Onset   Colon cancer Father 81       pt thinks father had colon cancer   Stomach cancer Father    Hypertension Mother    Colon polyps Brother    Aneurysm Maternal Aunt 58       brain   Heart disease Maternal Aunt        d 32   Stomach cancer Paternal Uncle 38   Aneurysm Maternal Grandfather        stomach   Colon cancer Maternal Grandfather        thinks grandfather had colon cancer   Stomach cancer Paternal Grandfather 18   Depression Brother 61   Suicidality Brother    Stomach cancer Paternal Uncle 87   Esophageal cancer Neg Hx  Rectal cancer Neg Hx    Diabetes Neg Hx    Allergies  Allergen Reactions   Cefuroxime Axetil Hives    OBJECTIVE: Blood pressure 138/74, pulse 75, temperature 98.5 F (36.9 C), temperature source Oral, resp. rate 15, height 5\' 11"  (1.803 m), weight 111.6 kg, SpO2 100 %.  Physical Exam Vitals and nursing note reviewed.  Constitutional:      Comments: Resting upright in bed. Appears in no distress.   HENT:     Head: Normocephalic.     Nose: No congestion.     Mouth/Throat:     Mouth: No oral lesions.     Dentition: Normal dentition. No dental caries.     Pharynx: Oropharynx is clear. No oropharyngeal exudate.     Comments: Limited assessment due to +trismus. No drainage observed or erythema inside the mouth  Eyes:     General: No scleral icterus. Neck:     Vascular: No carotid bruit.     Comments: Left anterolateral neck swelling noted with tenderness to palpation. No warmth or pulsating mass. No sinus tracts detected along underside of jaw.  Cardiovascular:     Rate and Rhythm: Normal rate and regular rhythm.     Heart sounds: Normal heart sounds.  Pulmonary:     Effort: Pulmonary effort is normal.     Breath sounds: Normal breath sounds.  Abdominal:     General: There is no distension.     Palpations: Abdomen is soft.     Tenderness: There is no abdominal tenderness.  Musculoskeletal:     Cervical back:  Tenderness present. No rigidity.  Lymphadenopathy:     Cervical: No cervical adenopathy.  Skin:    General: Skin is warm and dry.     Findings: No rash.  Neurological:     Mental Status: He is alert and oriented to person, place, and time.    Lab Results Lab Results  Component Value Date   WBC 18.5 (H) 11/17/2020   HGB 14.9 11/17/2020   HCT 45.8 11/17/2020   MCV 94.2 11/17/2020   PLT 342 11/17/2020    Lab Results  Component Value Date   CREATININE 0.74 11/17/2020   BUN 17 11/17/2020   NA 134 (L) 11/17/2020   K 4.2 11/17/2020   CL 98 11/17/2020   CO2 27 11/17/2020    Lab Results  Component Value Date   ALT 58 (H) 11/17/2020   AST 21 11/17/2020   ALKPHOS 92 11/17/2020   BILITOT 0.8 11/17/2020     Microbiology: Recent Results (from the past 240 hour(s))  Resp Panel by RT-PCR (Flu A&B, Covid) Nasopharyngeal Swab     Status: None   Collection Time: 11/18/20  9:00 AM   Specimen: Nasopharyngeal Swab; Nasopharyngeal(NP) swabs in vial transport medium  Result Value Ref Range Status   SARS Coronavirus 2 by RT PCR NEGATIVE NEGATIVE Final    Comment: (NOTE) SARS-CoV-2 target nucleic acids are NOT DETECTED.  The SARS-CoV-2 RNA is generally detectable in upper respiratory specimens during the acute phase of infection. The lowest concentration of SARS-CoV-2 viral copies this assay can detect is 138 copies/mL. A negative result does not preclude SARS-Cov-2 infection and should not be used as the sole basis for treatment or other patient management decisions. A negative result may occur with  improper specimen collection/handling, submission of specimen other than nasopharyngeal swab, presence of viral mutation(s) within the areas targeted by this assay, and inadequate number of viral copies(<138 copies/mL). A negative result must be  combined with clinical observations, patient history, and epidemiological information. The expected result is Negative.  Fact Sheet for  Patients:  EntrepreneurPulse.com.au  Fact Sheet for Healthcare Providers:  IncredibleEmployment.be  This test is no t yet approved or cleared by the [place] FDA and  has been authorized for detection and/or diagnosis of SARS-CoV-2 by FDA under an Emergency Use Authorization (EUA). This EUA will remain  in effect (meaning this test can be used) for the duration of the COVID-19 declaration under Section 564(b)(1) of the Act, 21 U.S.C.section 360bbb-3(b)(1), unless the authorization is terminated  or revoked sooner.       Influenza A by PCR NEGATIVE NEGATIVE Final   Influenza B by PCR NEGATIVE NEGATIVE Final    Comment: (NOTE) The Xpert Xpress SARS-CoV-2/FLU/RSV plus assay is intended as an aid in the diagnosis of influenza from Nasopharyngeal swab specimens and should not be used as a sole basis for treatment. Nasal washings and aspirates are unacceptable for Xpert Xpress SARS-CoV-2/FLU/RSV testing.  Fact Sheet for Patients: EntrepreneurPulse.com.au  Fact Sheet for Healthcare Providers: IncredibleEmployment.be  This test is not yet approved or cleared by the [place] FDA and has been authorized for detection and/or diagnosis of SARS-CoV-2 by FDA under an Emergency Use Authorization (EUA). This EUA will remain in effect (meaning this test can be used) for the duration of the COVID-19 declaration under Section 564(b)(1) of the Act, 21 U.S.C. section 360bbb-3(b)(1), unless the authorization is terminated or revoked.  Performed at [facility], [place] [address] [place], [place] [address]     Janene Madeira, MSN, NP-C [facility] [place] Cell: [phone] Pager: [phone]  11/18/2020 11:24 AM

## 2020-11-18 NOTE — Procedures (Signed)
Interventional Radiology Procedure Note  Procedure: US aspiration of left subperiosteal mandibular abscess yielding 20 mL thick bloody purulent fluid.   Complications: None  Estimated Blood Loss: None  Recommendations: - Sent for culture   Signed,  Criselda Peaches, MD

## 2020-11-18 NOTE — ED Provider Notes (Signed)
[facility] EMERGENCY DEPARTMENT Provider Note   CSN: [id number] Arrival date & time: 11/17/20  1451     History Chief Complaint  Patient presents with   Dental Pain    [name] is a 60 y.o. male.  Patient complains of swelling to the left side of his face.  He was seen by an oral surgeon who initially gave him some IV antibiotics but now he has gotten worse.  The oral surgeon suggested he get a CT scan of his face.  His family doctor ordered 1 and he got the CT scan yesterday that shows osteomyelitis of the mandible  The history is provided by the patient and medical records. No language interpreter was used.  Dental Pain Location:  Upper Quality:  Aching and dull Severity:  Moderate Onset quality:  Gradual Timing:  Constant Progression:  Worsening Chronicity:  Recurrent Context: abscess   Relieved by:  Nothing Worsened by:  Nothing Ineffective treatments:  None tried Associated symptoms: no congestion and no headaches       Past Medical History:  Diagnosis Date   Allergy    Anxiety    Arthritis    Back   Chronic back pain    Colon polyps    COPD (chronic obstructive pulmonary disease) (HCC)    Depression    GERD (gastroesophageal reflux disease)    every now and then   Headache    Hyperlipidemia 09/26/2015   Lumbar surgical wound fluid collection    lumbar wound dehiscence   Pneumonia 2020    Patient Active Problem List   Diagnosis Date Noted   Acute osteomyelitis of mandible 11/18/2020   Neck swelling 11/17/2020   Neck pain on left side 11/17/2020   Left-sided face pain 11/17/2020   Swelling of left side of face 11/17/2020   Paresthesia of foot 10/21/2020   Infected tick bite of buttock 10/21/2020   Loss of transverse plantar arch 10/02/2020   Trochanteric bursitis of both hips 09/23/2020   Hallux rigidus of both feet 08/21/2020   Disc displacement, lumbar 07/09/2020   Pseudoarthrosis of lumbar spine 04/10/2020    Degenerative joint disease of knee, left 02/26/2020   Rash 02/21/2020   Other chronic pain 02/12/2020   Throat pain in adult    Peritonsillar abscess    Paresthesia of skin 01/08/2020   Numbness and tingling of foot 01/08/2020   Trochanteric bursitis, left hip 09/25/2019   S/P lumbar fusion 09/10/2019   Elevated blood-pressure reading, without diagnosis of hypertension 08/20/2019   Neck pain 05/17/2019   Greater trochanteric pain syndrome 04/03/2019   Fibromatosis of plantar fascia 11/29/2018   Pronation deformity of both feet 11/01/2018   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   Plantar fasciitis 09/27/2018   Left hip pain 09/27/2018   Microhematuria 09/27/2018   Chronic cough 09/27/2018   Alcohol abuse 09/27/2018   Numbness of upper limb 05/15/2018   Low testosterone 12/10/2015   Polymyalgia ([place]) 11/11/2015   Easy bruising 10/30/2015   Bursitis of right shoulder 09/30/2015   Hyperglycemia 09/26/2015   Hyperlipidemia 09/26/2015   History of colonic polyps 09/26/2015   Acute medial meniscal tear 09/09/2015   Left knee pain 08/28/2015   Disorder of sacroiliac joint 06/05/2015   Shoulder pain 02/06/2015   Status post lumbar spine operation 04/02/2014   Dehiscence of surgical wound 03/18/2014   S/P lumbar spinal fusion 02/14/2014   Low back pain 02/11/2014   Hip pain 11/13/2013   Body mass index (BMI) 38.0-38.9, adult  07/12/2013   OA (osteoarthritis) 03/07/2012   Fatigue 03/07/2012   Preventative health care 09/03/2010   Smoker 09/03/2010    Past Surgical History:  Procedure Laterality Date   BACK SURGERY     COLONOSCOPY     COLONOSCOPY W/ BIOPSIES AND POLYPECTOMY     EYE SURGERY     KNEE ARTHROSCOPY WITH MEDIAL MENISECTOMY Left 01/23/2016   Procedure: KNEE ARTHROSCOPY WITH MEDIAL MENISECTOMY;  Surgeon: Melrose Nakayama, MD;  Location: [facility];  Service: Orthopedics;  Laterality: Left;   LUMBAR LAMINECTOMY/DECOMPRESSION MICRODISCECTOMY N/A 06/09/2020    Procedure: Decompressive laminectomy Lumbarone -two;  Surgeon: Eustace Moore, MD;  Location: [facility];  Service: Neurosurgery;  Laterality: N/A;   LUMBAR WOUND DEBRIDEMENT N/A 03/21/2014   Procedure: LUMBAR WOUND DEBRIDEMENT;  Surgeon: Eustace Moore, MD;  Location: [facility];  Service: Neurosurgery;  Laterality: N/A;   MAXIMUM ACCESS (MAS)POSTERIOR LUMBAR INTERBODY FUSION (PLIF) 1 LEVEL N/A 02/14/2014   Procedure: FOR MAXIMUM ACCESS SURGERY POSTERIOR LUMBAR INTERBODY FUSION LUMBAR TWO-THREE;  Surgeon: Eustace Moore, MD;  Location: [facility];  Service: Neurosurgery;  Laterality: N/A;   NECK SURGERY     SHOULDER SURGERY     SPINE SURGERY     TONSILLECTOMY         Family History  Problem Relation Age of Onset   Colon cancer Father 4       pt thinks father had colon cancer   Stomach cancer Father    Hypertension Mother    Colon polyps Brother    Aneurysm Maternal Aunt 21       brain   Heart disease Maternal Aunt        d 67   Stomach cancer Paternal Uncle 80   Aneurysm Maternal Grandfather        stomach   Colon cancer Maternal Grandfather        thinks grandfather had colon cancer   Stomach cancer Paternal Grandfather 69   Depression Brother 56   Suicidality Brother    Stomach cancer Paternal Uncle 29   Esophageal cancer Neg Hx    Rectal cancer Neg Hx    Diabetes Neg Hx     Social History   Tobacco Use   Smoking status: Every Day    Packs/day: 0.50    Years: 44.00    Pack years: 22.00    Types: Cigarettes   Smokeless tobacco: Current    Types: Snuff  Vaping Use   Vaping Use: Never used  Substance Use Topics   Alcohol use: Yes    Alcohol/week: 24.0 standard drinks    Types: 24 Standard drinks or equivalent per week    Comment: social beer   Drug use: No    Home Medications Prior to Admission medications   Medication Sig Start Date End Date Taking? Authorizing Provider  albuterol (VENTOLIN HFA) 108 (90 Base) MCG/ACT inhaler INHALE 1-2 PUFFS INTO THE LUNGS  EVERY 6 (SIX) HOURS AS NEEDED FOR WHEEZING OR SHORTNESS OF BREATH. 08/19/20   [name], MD  Ascorbic Acid (VITAMIN C) 1000 MG tablet Take 1,000 mg by mouth daily.    [provider]  atorvastatin (LIPITOR) 10 MG tablet TAKE 1 TABLET BY MOUTH EVERY DAY 09/19/20   [name], MD  benzonatate (TESSALON) 100 MG capsule Take 1 capsule (100 mg total) by mouth 2 (two) times daily as needed for cough. Patient not taking: Reported on 11/17/2020 09/23/20   Lucretia Kern, DO  cholecalciferol (VITAMIN D3) 25 MCG (1000 UNIT) tablet Take 1,000 Units by mouth daily.    [provider]  clindamycin (CLEOCIN) 150 MG capsule Take by mouth. 11/11/20   [provider]  doxycycline (VIBRA-TABS) 100 MG tablet Take 1 tablet (100 mg total) by mouth 2 (two) times daily. 10/21/20   [name], MD  DULoxetine (CYMBALTA) 60 MG capsule Take 60 mg by mouth at bedtime. 11/05/14   [provider]  gabapentin (NEURONTIN) 300 MG capsule Take 1 capsule (300 mg total) by mouth 3 (three) times daily. Patient taking differently: Take 300 mg by mouth 4 (four) times daily. 04/21/17   Rosemarie Ax, MD  meloxicam (MOBIC) 15 MG tablet Take 1 tablet (15 mg total) by mouth daily. 09/27/18   [name], MD  methylPREDNISolone (MEDROL DOSEPAK) 4 MG TBPK tablet Take by mouth. 11/11/20   [provider]  Multiple Vitamin (MULTIVITAMIN WITH MINERALS) TABS tablet Take 1 tablet by mouth daily.    [provider]  Oxycodone HCl 10 MG TABS Take 1-2 tablets (10-20 mg total) by mouth every 4 (four) hours as needed (pain). 06/10/20   Eustace Moore, MD  tiZANidine (ZANAFLEX) 4 MG tablet Take 4 mg by mouth 3 (three) times daily.  11/05/14   [provider]  vitamin B-12 (CYANOCOBALAMIN) 1000 MCG tablet Take 1,000 mcg by mouth daily.    [provider]    Allergies    Cefuroxime axetil and Cefuroxime axetil  Review of Systems   Review of Systems  Constitutional:  Negative  for appetite change and fatigue.  HENT:  Negative for congestion, ear discharge and sinus pressure.        Swelling to left side of face  Eyes:  Negative for discharge.  Respiratory:  Negative for cough.   Cardiovascular:  Negative for chest pain.  Gastrointestinal:  Negative for abdominal pain and diarrhea.  Genitourinary:  Negative for frequency and hematuria.  Musculoskeletal:  Negative for back pain.  Skin:  Negative for rash.  Neurological:  Negative for seizures and headaches.  Psychiatric/Behavioral:  Negative for hallucinations.    Physical Exam Updated Vital Signs BP (!) 147/83 (BP Location: Left Arm)   Pulse 83   Temp 98.3 F (36.8 C)   Resp 15   Ht 5\' 11"  (1.803 m)   Wt 111.6 kg   SpO2 99%   BMI 34.31 kg/m   Physical Exam Vitals and nursing note reviewed.  Constitutional:      Appearance: He is well-developed.  HENT:     Head: Normocephalic.     Comments: Tender swelling left side of face    Nose: Nose normal.  Eyes:     General: No scleral icterus.    Conjunctiva/sclera: Conjunctivae normal.  Neck:     Thyroid: No thyromegaly.  Cardiovascular:     Rate and Rhythm: Normal rate and regular rhythm.     Heart sounds: No murmur heard.   No friction rub. No gallop.  Pulmonary:     Breath sounds: No stridor. No wheezing or rales.  Chest:     Chest wall: No tenderness.  Abdominal:     General: There is no distension.     Tenderness: There is no abdominal tenderness. There is no rebound.  Musculoskeletal:        General: Normal range of motion.     Cervical back: Neck supple.  Lymphadenopathy:     Cervical: No cervical adenopathy.  Skin:  Findings: No erythema or rash.  Neurological:     Mental Status: He is alert and oriented to person, place, and time.     Motor: No abnormal muscle tone.     Coordination: Coordination normal.  Psychiatric:        Behavior: Behavior normal.    ED Results / Procedures / Treatments   Labs (all labs ordered are  listed, but only abnormal results are displayed) Labs Reviewed  CBC WITH DIFFERENTIAL/PLATELET - Abnormal; Notable for the following components:      Result Value   WBC 18.5 (*)    RDW 16.1 (*)    Neutro Abs 13.4 (*)    Monocytes Absolute 1.5 (*)    Abs Immature Granulocytes 0.50 (*)    All other components within normal limits  BASIC METABOLIC PANEL - Abnormal; Notable for the following components:   Sodium 134 (*)    All other components within normal limits  RESP PANEL BY RT-PCR (FLU A&B, COVID) ARPGX2  LACTIC ACID, PLASMA    EKG None  Radiology CT Soft Tissue Neck W Contrast  Result Date: 11/17/2020 CLINICAL DATA:  Swelling of the left face. Previous dental infections treated with antibiotics. EXAM: CT NECK WITH CONTRAST TECHNIQUE: Multidetector CT imaging of the neck was performed using the standard protocol following the bolus administration of intravenous contrast. CONTRAST:  82mL OMNIPAQUE IOHEXOL 300 MG/ML  SOLN COMPARISON:  Maxillofacial CT same day. FINDINGS: Pharynx and larynx: No mucosal or submucosal lesion. Salivary glands: Parotid and submandibular glands are normal. Thyroid: Normal Lymph nodes: No enlarged or low-density nodes on either side of the neck Vascular: No vascular pathology. Limited intracranial: Normal Visualized orbits: Normal Mastoids and visualized paranasal sinuses: Clear Skeleton: As noted at the maxillofacial CT, there is osteomyelitis of the left mandibular ramus, coronoid process and angle with surrounding phlegmonous inflammation or abscess affecting the muscles of mastication. See that report for complete description. Elsewhere in the neck, there has been distant ACDF C5 through C7 with solid union. Some adjacent segment degenerative changes noted at C4-5 and C7-T1 Upper chest: Negative Other: Fluid in the mastoid air cells on the left. IMPRESSION: See results of maxillofacial CT. Osteomyelitis of the left mandible with surrounding phlegmonous  inflammation or abscess on both sides affecting the muscles of mastication. Otherwise no soft tissue lesion seen in the neck. No evidence of deep space extension of the infection. Left mastoid effusion. Electronically Signed   By: Nelson Chimes M.D.   On: 11/17/2020 11:53   CT Maxillofacial W/Cm  Result Date: 11/17/2020 CLINICAL DATA:  Maxillofacial pain and swelling on the left over the last 2-3 months. Symptoms began after dental work and improved after antibiotics. Question parotid disease. EXAM: CT MAXILLOFACIAL WITH CONTRAST TECHNIQUE: Multidetector CT imaging of the maxillofacial structures was performed with intravenous contrast. Multiplanar CT image reconstructions were also generated. CONTRAST:  72mL OMNIPAQUE IOHEXOL 300 MG/ML  SOLN COMPARISON:  None. FINDINGS: There is rarefaction of the left mandible affecting the ramus, coronoid process and angle of the mandible consistent with osteomyelitis. No frank bone destruction. There is low-density of the soft tissues on both sides of the mandible, with involvement of the muscles of mastication. This could be due to phlegmonous inflammation or frank abscess/myositis. This inflammatory low-density does not appear to track into any other deep space. Elsewhere, the actual dental work of left mandible appears the consist of a root canal the last molar in a crown of the tooth adjacent to that. There is no  evidence of root abscess at those levels. Both parotid glands are normal. Both submandibular glands are normal. No evidence of regional adenopathy. IMPRESSION: Osteomyelitis of the left mandible affecting the ramus, coronoid process and angle of the mandible. Surrounding low-density material could be phlegmonous inflammation or frank abscess and involves the muscles of mastication. Electronically Signed   By: Nelson Chimes M.D.   On: 11/17/2020 11:50    Procedures Procedures   Medications Ordered in ED Medications  Ampicillin-Sulbactam (UNASYN) 3 g in  sodium chloride 0.9 % 100 mL IVPB (has no administration in time range)  sodium chloride 0.9 % bolus 1,000 mL (0 mLs Intravenous Stopped 11/18/20 0943)  morphine 4 MG/ML injection 4 mg (4 mg Intravenous Given 11/18/20 2751)    ED Course  I have reviewed the triage vital signs and the nursing notes.  Pertinent labs & imaging results that were available during my care of the patient were reviewed by me and considered in my medical decision making (see chart for details).  Patient with osteomyelitis left mandible. MDM Rules/Calculators/A&P                          Patient with osteomyelitis of the left mandible.  He was admitted to medicine with ID consult.  Also oral surgery Dr. Olena Heckle  will be notified Final Clinical Impression(s) / ED Diagnoses Final diagnoses:  None    Rx / DC Orders ED Discharge Orders     None        Milton Ferguson, MD 11/18/20 1007

## 2020-11-18 NOTE — Plan of Care (Signed)
  Problem: Health Behavior/Discharge Planning: Goal: Ability to manage health-related needs will improve Outcome: Progressing   Problem: Education: Goal: Knowledge of General Education information will improve Description: Including pain rating scale, medication(s)/side effects and non-pharmacologic comfort measures Outcome: Progressing   Problem: Clinical Measurements: Goal: Ability to maintain clinical measurements within normal limits will improve Outcome: Progressing   Problem: Pain Managment: Goal: General experience of comfort will improve Outcome: Progressing   Problem: Safety: Goal: Ability to remain free from injury will improve Outcome: Progressing   Problem: Skin Integrity: Goal: Risk for impaired skin integrity will decrease Outcome: Progressing

## 2020-11-18 NOTE — Progress Notes (Signed)
Pharmacy Antibiotic Note  [name] is a 60 y.o. male admitted on 11/17/2020 with  osteomyelitis of the jaw .  Pharmacy has been consulted for unasyn dosing.  Plan: Unasyn 3g IV q6h -Monitor renal function, clinical status, and antibiotic plan  Height: 5\' 11"  (180.3 cm) Weight: 111.6 kg (246 lb) IBW/kg (Calculated) : 75.3  Temp (24hrs), Avg:98.8 F (37.1 C), Min:98.3 F (36.8 C), Max:99.5 F (37.5 C)  Recent Labs  Lab 11/17/20 1009 11/17/20 1639  WBC 17.9* 18.5*  CREATININE 0.64 0.74  LATICACIDVEN  --  1.0    Estimated Creatinine Clearance: 127.8 mL/min (by C-G formula based on SCr of 0.74 mg/dL).    Allergies  Allergen Reactions   Cefuroxime Axetil Hives    Antimicrobials this admission: Unasyn 7/19 >>   Thank you for allowing pharmacy to be a part of this patient's care.  Joetta Manners, PharmD, [facility] Emergency Medicine Clinical Pharmacist ED RPh Phone: [place]: [phone]

## 2020-11-18 NOTE — Progress Notes (Signed)
Chief Complaint: Patient was seen in consultation today for left jaw/mandible abscess  Referring Physician(s): Dr. Fuller Plan  Supervising Physician: Jacqulynn Cadet  Patient Status: [facility] - In-pt  History of Present Illness: [name] is a 60 y.o. male admitted with evidence of osteomyelitis of the left mandible with abscess development. IR is asked to perform image guided aspiration for fluid sampling/culture. PMHx, meds, labs, imaging, allergies reviewed. Has been NPO today   Past Medical History:  Diagnosis Date   Allergy    Anxiety    Arthritis    Back   Chronic back pain    Colon polyps    COPD (chronic obstructive pulmonary disease) (HCC)    Depression    GERD (gastroesophageal reflux disease)    every now and then   Headache    Hyperlipidemia 09/26/2015   Lumbar surgical wound fluid collection    lumbar wound dehiscence   Pneumonia 2020    Past Surgical History:  Procedure Laterality Date   BACK SURGERY     COLONOSCOPY     COLONOSCOPY W/ BIOPSIES AND POLYPECTOMY     EYE SURGERY     KNEE ARTHROSCOPY WITH MEDIAL MENISECTOMY Left 01/23/2016   Procedure: KNEE ARTHROSCOPY WITH MEDIAL MENISECTOMY;  Surgeon: Melrose Nakayama, MD;  Location: [place];  Service: Orthopedics;  Laterality: Left;   LUMBAR LAMINECTOMY/DECOMPRESSION MICRODISCECTOMY N/A 06/09/2020   Procedure: Decompressive laminectomy Lumbarone -two;  Surgeon: Eustace Moore, MD;  Location: [place];  Service: Neurosurgery;  Laterality: N/A;   LUMBAR WOUND DEBRIDEMENT N/A 03/21/2014   Procedure: LUMBAR WOUND DEBRIDEMENT;  Surgeon: Eustace Moore, MD;  Location: [place] [facility];  Service: Neurosurgery;  Laterality: N/A;   MAXIMUM ACCESS (MAS)POSTERIOR LUMBAR INTERBODY FUSION (PLIF) 1 LEVEL N/A 02/14/2014   Procedure: FOR MAXIMUM ACCESS SURGERY POSTERIOR LUMBAR INTERBODY FUSION LUMBAR TWO-THREE;  Surgeon: Eustace Moore, MD;  Location: [place] [facility];  Service: Neurosurgery;  Laterality:  N/A;   NECK SURGERY     SHOULDER SURGERY     SPINE SURGERY     TONSILLECTOMY      Allergies: Cefuroxime axetil  Medications:  Current Facility-Administered Medications:    0.9 %  sodium chloride infusion, , Intravenous, Continuous, Tamala Julian, Rondell A, MD, Last Rate: 75 mL/hr at 11/18/20 1039, New Bag at 11/18/20 1039   acetaminophen (TYLENOL) tablet 650 mg, 650 mg, Oral, Q6H PRN **OR** acetaminophen (TYLENOL) suppository 650 mg, 650 mg, Rectal, Q6H PRN, Smith, Rondell A, MD   Ampicillin-Sulbactam (UNASYN) 3 g in sodium chloride 0.9 % 100 mL IVPB, 3 g, Intravenous, Q6H, Smith, Rondell A, MD   enoxaparin (LOVENOX) injection 40 mg, 40 mg, Subcutaneous, Daily, Tamala Julian, Rondell A, MD, 40 mg at 11/18/20 1043   fentaNYL (SUBLIMAZE) 100 MCG/2ML injection, , , ,    folic acid (FOLVITE) tablet 1 mg, 1 mg, Oral, Daily, Smith, Rondell A, MD   LORazepam (ATIVAN) tablet 1-4 mg, 1-4 mg, Oral, Q1H PRN **OR** LORazepam (ATIVAN) injection 1-4 mg, 1-4 mg, Intravenous, Q1H PRN, Tamala Julian, Rondell A, MD   midazolam (VERSED) 2 MG/2ML injection, , , ,    morphine 2 MG/ML injection 2 mg, 2 mg, Intravenous, Q2H PRN, Tamala Julian, Rondell A, MD, 2 mg at 11/18/20 1417   multivitamin with minerals tablet 1 tablet, 1 tablet, Oral, Daily, Smith, Rondell A, MD   ondansetron (ZOFRAN) tablet 4 mg, 4 mg, Oral, Q6H PRN **OR** ondansetron (ZOFRAN) injection 4 mg, 4 mg, Intravenous, Q6H PRN, Norval Morton, MD   sodium  chloride flush (NS) 0.9 % injection 3 mL, 3 mL, Intravenous, Q12H, Smith, Rondell A, MD   thiamine tablet 100 mg, 100 mg, Oral, Daily **OR** thiamine (B-1) injection 100 mg, 100 mg, Intravenous, Daily, Smith, Rondell A, MD    Family History  Problem Relation Age of Onset   Colon cancer Father 64       pt thinks father had colon cancer   Stomach cancer Father    Hypertension Mother    Colon polyps Brother    Aneurysm Maternal Aunt 3       brain   Heart disease Maternal Aunt        d 67   Stomach cancer Paternal  Uncle 62   Aneurysm Maternal Grandfather        stomach   Colon cancer Maternal Grandfather        thinks grandfather had colon cancer   Stomach cancer Paternal Grandfather 37   Depression Brother 19   Suicidality Brother    Stomach cancer Paternal Uncle 68   Esophageal cancer Neg Hx    Rectal cancer Neg Hx    Diabetes Neg Hx     Social History   Socioeconomic History   Marital status: Married    Spouse name: cindy   Number of children: Not on file   Years of education: Not on file   Highest education level: Not on file  Occupational History   Occupation: produce manager/ disability    Employer: [employer]  Tobacco Use   Smoking status: Every Day    Packs/day: 0.50    Years: 44.00    Pack years: 22.00    Types: Cigarettes   Smokeless tobacco: Current    Types: Snuff  Vaping Use   Vaping Use: Never used  Substance and Sexual Activity   Alcohol use: Yes    Alcohol/week: 24.0 standard drinks    Types: 24 Standard drinks or equivalent per week    Comment: social beer   Drug use: No   Sexual activity: Yes    Partners: Female  Other Topics Concern   Not on file  Social History Narrative   Exercise--no   Social Determinants of Health   Financial Resource Strain: Low Risk    Difficulty of Paying Living Expenses: Not hard at all  Food Insecurity: No Food Insecurity   Worried About Charity fundraiser in the Last Year: Never true   Arboriculturist in the Last Year: Never true  Transportation Needs: No Transportation Needs   Lack of Transportation (Medical): No   Lack of Transportation (Non-Medical): No  Physical Activity: Inactive   Days of Exercise per Week: 0 days   Minutes of Exercise per Session: 0 min  Stress: No Stress Concern Present   Feeling of Stress : Not at all  Social Connections: Not on file    Review of Systems: A 12 point ROS discussed and pertinent positives are indicated in the HPI above.  All other systems are negative.  Review of  Systems  Vital Signs: BP 138/74   Pulse 75   Temp 98.5 F (36.9 C) (Oral)   Resp 15   Ht 5\' 11"  (1.803 m)   Wt 111.6 kg   SpO2 100%   BMI 34.31 kg/m   Physical Exam Constitutional:      Appearance: Normal appearance.  HENT:     Mouth/Throat:     Mouth: Mucous membranes are moist.     Pharynx: Oropharynx is  clear.  Cardiovascular:     Rate and Rhythm: Normal rate and regular rhythm.     Heart sounds: Normal heart sounds.  Pulmonary:     Effort: Pulmonary effort is normal. No respiratory distress.     Breath sounds: Normal breath sounds.  Neurological:     General: No focal deficit present.     Mental Status: He is alert and oriented to person, place, and time.  Psychiatric:        Mood and Affect: Mood normal.        Thought Content: Thought content normal.        Judgment: Judgment normal.     Imaging: DG Chest 2 View  Result Date: 11/18/2020 CLINICAL DATA:  Productive cough. EXAM: CHEST - 2 VIEW COMPARISON:  06/03/2020. FINDINGS: Mediastinum hilar structures normal. Lungs are clear. No pleural effusion or pneumothorax. Heart size normal. Old right posterior eighth rib fracture again noted. Prior cervicothoracic spine fusion. Diffuse osteopenia degenerative change thoracic spine. IMPRESSION: No acute cardiopulmonary disease. Electronically Signed   By: Marcello Moores  Register   On: 11/18/2020 10:49   CT Soft Tissue Neck W Contrast  Result Date: 11/17/2020 CLINICAL DATA:  Swelling of the left face. Previous dental infections treated with antibiotics. EXAM: CT NECK WITH CONTRAST TECHNIQUE: Multidetector CT imaging of the neck was performed using the standard protocol following the bolus administration of intravenous contrast. CONTRAST:  98mL OMNIPAQUE IOHEXOL 300 MG/ML  SOLN COMPARISON:  Maxillofacial CT same day. FINDINGS: Pharynx and larynx: No mucosal or submucosal lesion. Salivary glands: Parotid and submandibular glands are normal. Thyroid: Normal Lymph nodes: No enlarged or  low-density nodes on either side of the neck Vascular: No vascular pathology. Limited intracranial: Normal Visualized orbits: Normal Mastoids and visualized paranasal sinuses: Clear Skeleton: As noted at the maxillofacial CT, there is osteomyelitis of the left mandibular ramus, coronoid process and angle with surrounding phlegmonous inflammation or abscess affecting the muscles of mastication. See that report for complete description. Elsewhere in the neck, there has been distant ACDF C5 through C7 with solid union. Some adjacent segment degenerative changes noted at C4-5 and C7-T1 Upper chest: Negative Other: Fluid in the mastoid air cells on the left. IMPRESSION: See results of maxillofacial CT. Osteomyelitis of the left mandible with surrounding phlegmonous inflammation or abscess on both sides affecting the muscles of mastication. Otherwise no soft tissue lesion seen in the neck. No evidence of deep space extension of the infection. Left mastoid effusion. Electronically Signed   By: Nelson Chimes M.D.   On: 11/17/2020 11:53   CT Maxillofacial W/Cm  Result Date: 11/17/2020 CLINICAL DATA:  Maxillofacial pain and swelling on the left over the last 2-3 months. Symptoms began after dental work and improved after antibiotics. Question parotid disease. EXAM: CT MAXILLOFACIAL WITH CONTRAST TECHNIQUE: Multidetector CT imaging of the maxillofacial structures was performed with intravenous contrast. Multiplanar CT image reconstructions were also generated. CONTRAST:  98mL OMNIPAQUE IOHEXOL 300 MG/ML  SOLN COMPARISON:  None. FINDINGS: There is rarefaction of the left mandible affecting the ramus, coronoid process and angle of the mandible consistent with osteomyelitis. No frank bone destruction. There is low-density of the soft tissues on both sides of the mandible, with involvement of the muscles of mastication. This could be due to phlegmonous inflammation or frank abscess/myositis. This inflammatory low-density does  not appear to track into any other deep space. Elsewhere, the actual dental work of left mandible appears the consist of a root canal the last molar in a crown  of the tooth adjacent to that. There is no evidence of root abscess at those levels. Both parotid glands are normal. Both submandibular glands are normal. No evidence of regional adenopathy. IMPRESSION: Osteomyelitis of the left mandible affecting the ramus, coronoid process and angle of the mandible. Surrounding low-density material could be phlegmonous inflammation or frank abscess and involves the muscles of mastication. Electronically Signed   By: Nelson Chimes M.D.   On: 11/17/2020 11:50    Labs:  CBC: Recent Labs    03/17/20 2200 06/03/20 0843 11/17/20 1009 11/17/20 1639  WBC 9.8 12.8* 17.9* 18.5*  HGB 15.1 15.3 14.6 14.9  HCT 46.7 46.0 43.2 45.8  PLT 306 326 322.0 342    COAGS: Recent Labs    02/21/20 1033 06/03/20 0843 11/17/20 1009  INR 0.9 0.9 1.0    BMP: Recent Labs    02/06/20 1208 02/21/20 1033 03/17/20 2200 06/03/20 0843 11/17/20 1009 11/17/20 1639  NA 138   < > 138 138 137 134*  K 5.2*   < > 5.2* 4.7 4.5 4.2  CL 101   < > 100 104 101 98  CO2 25   < > 25 25 26 27   GLUCOSE 123*   < > 104* 122* 107* 70  BUN 12   < > 7 11 16 17   CALCIUM 9.8   < > 9.4 9.5 9.2 9.5  CREATININE 0.89   < > 0.80 0.69 0.64 0.74  GFRNONAA >60  --  >60 >60  --  >60   < > = values in this interval not displayed.    LIVER FUNCTION TESTS: Recent Labs    02/21/20 1033 11/17/20 1009  BILITOT 0.6 0.8  AST 19 21  ALT 31 58*  ALKPHOS 84 92  PROT 6.7 6.6  ALBUMIN 4.4 4.1    TUMOR MARKERS: No results for input(s): AFPTM, CEA, CA199, CHROMGRNA in the last 8760 hours.  Assessment and Plan: Left mandible abscess with underlying osteomyelitis Image reviewed. For US guided aspiration. Pt has been NPO and would like tolerate procedure with sedation, he is agreeable. Risks and benefits discussed with the patient including  bleeding, infection, damage to adjacent structures, and sepsis.  All of the patient's questions were answered, patient is agreeable to proceed. Consent signed and in chart.   Thank you for this interesting consult.  I greatly enjoyed meeting Gagandeep Pettet Ludwick and look forward to participating in their care.  A copy of this report was sent to the requesting provider on this date.  Electronically Signed: Ascencion Dike, PA-C 11/18/2020, 3:15 PM   I spent a total of 20 minutes in face to face in clinical consultation, greater than 50% of which was counseling/coordinating care for left mandible abscess

## 2020-11-19 ENCOUNTER — Inpatient Hospital Stay (HOSPITAL_COMMUNITY): Payer: Medicare Other

## 2020-11-19 ENCOUNTER — Inpatient Hospital Stay: Payer: Self-pay

## 2020-11-19 DIAGNOSIS — D72829 Elevated white blood cell count, unspecified: Secondary | ICD-10-CM

## 2020-11-19 DIAGNOSIS — M272 Inflammatory conditions of jaws: Secondary | ICD-10-CM | POA: Diagnosis not present

## 2020-11-19 DIAGNOSIS — J440 Chronic obstructive pulmonary disease with acute lower respiratory infection: Secondary | ICD-10-CM

## 2020-11-19 DIAGNOSIS — J209 Acute bronchitis, unspecified: Secondary | ICD-10-CM

## 2020-11-19 DIAGNOSIS — M7989 Other specified soft tissue disorders: Secondary | ICD-10-CM | POA: Diagnosis not present

## 2020-11-19 DIAGNOSIS — I82619 Acute embolism and thrombosis of superficial veins of unspecified upper extremity: Secondary | ICD-10-CM

## 2020-11-19 DIAGNOSIS — M542 Cervicalgia: Secondary | ICD-10-CM | POA: Diagnosis not present

## 2020-11-19 LAB — CBC
HCT: 39.2 % (ref 39.0–52.0)
Hemoglobin: 12.6 g/dL — ABNORMAL LOW (ref 13.0–17.0)
MCH: 30 pg (ref 26.0–34.0)
MCHC: 32.1 g/dL (ref 30.0–36.0)
MCV: 93.3 fL (ref 80.0–100.0)
Platelets: 301 10*3/uL (ref 150–400)
RBC: 4.2 MIL/uL — ABNORMAL LOW (ref 4.22–5.81)
RDW: 15.8 % — ABNORMAL HIGH (ref 11.5–15.5)
WBC: 12.7 10*3/uL — ABNORMAL HIGH (ref 4.0–10.5)
nRBC: 0 % (ref 0.0–0.2)

## 2020-11-19 LAB — COMPREHENSIVE METABOLIC PANEL
ALT: 37 U/L (ref 0–44)
AST: 14 U/L — ABNORMAL LOW (ref 15–41)
Albumin: 3 g/dL — ABNORMAL LOW (ref 3.5–5.0)
Alkaline Phosphatase: 71 U/L (ref 38–126)
Anion gap: 9 (ref 5–15)
BUN: 10 mg/dL (ref 6–20)
CO2: 23 mmol/L (ref 22–32)
Calcium: 8.7 mg/dL — ABNORMAL LOW (ref 8.9–10.3)
Chloride: 103 mmol/L (ref 98–111)
Creatinine, Ser: 0.66 mg/dL (ref 0.61–1.24)
GFR, Estimated: >60 mL/min (ref >60)
Glucose, Bld: 86 mg/dL (ref 70–99)
Potassium: 3.8 mmol/L (ref 3.5–5.1)
Sodium: 135 mmol/L (ref 135–145)
Total Bilirubin: 1.3 mg/dL — ABNORMAL HIGH (ref 0.3–1.2)
Total Protein: 5.8 g/dL — ABNORMAL LOW (ref 6.5–8.1)

## 2020-11-19 LAB — MAGNESIUM: Magnesium: 2 mg/dL (ref 1.7–2.4)

## 2020-11-19 LAB — PHOSPHORUS: Phosphorus: 3 mg/dL (ref 2.5–4.6)

## 2020-11-19 MED ORDER — BOOST PLUS PO LIQD
237.0000 mL | Freq: Three times a day (TID) | ORAL | Status: DC
  Administered 2020-11-19: 237 mL via ORAL
  Filled 2020-11-19 (×4): qty 237

## 2020-11-19 MED ORDER — SODIUM CHLORIDE 0.9 % IV SOLN
1.0000 g | INTRAVENOUS | Status: DC
  Administered 2020-11-19 – 2020-11-20 (×2): 1000 mg via INTRAVENOUS
  Filled 2020-11-19 (×3): qty 1

## 2020-11-19 MED ORDER — SODIUM CHLORIDE 0.9 % IV SOLN: INTRAVENOUS | Status: DC

## 2020-11-19 NOTE — Progress Notes (Signed)
[place] for Infectious Disease  Date of Admission:  11/17/2020      Total days of antibiotics 2  Unasyn 7/19 >> current          ASSESSMENT: [name] is a 60 y.o. male with severe mandibular osteomyelitis, mastication muscle abscess now s/p IR aspiration of this. Gram stain with gram positive cocci in clusters/pairs with cultures pending. Will go ahead and change to [facility] for easier administration for home use.  PICC line OK to be placed now.  Follow for general dentistry recommendations for any acute intervention needed prior to D/C --> he will need to follow up with his regular oral surgeon closely.     PLAN: Change unasyn to invanz PICC line to be placed (ordered)  Follow cultures in AM  Follow up recs from dentistry   Hopeful he can be discharged tomorrow if we can coordinate home health efforts and no need for further dental intervention.    Principal Problem:   Osteomyelitis, jaw acute Active Problems:   Tobacco abuse   Neck pain on left side   Cough   Chronic obstructive pulmonary disease (HCC)   COPD with acute bronchitis (HCC)   Abscess, jaw   Leukocytosis    enoxaparin (LOVENOX) injection  40 mg Subcutaneous Daily   folic acid  1 mg Oral Daily   guaiFENesin  600 mg Oral BID   lactose free nutrition  237 mL Oral TID WC   multivitamin with minerals  1 tablet Oral Daily   nicotine  14 mg Transdermal Daily   sodium chloride flush  3 mL Intravenous Q12H   thiamine  100 mg Oral Daily   Or   thiamine  100 mg Intravenous Daily    SUBJECTIVE: Getting up to walk the halls. Had large amount of purulence aspirated from his jaw in IR and feels better. Cannot open his mouth all the way but a little more.  No fevers/chills. Tolerating antibiotics well.  Has not seen his oral surgeon - no privileges here.    Review of Systems: Review of Systems  Constitutional:  Negative for chills and fever.  HENT:  Negative for sore throat and  tinnitus.        Left jaw pain with swelling. +trismus but improved quality   Eyes:  Negative for blurred vision and photophobia.  Respiratory:  Negative for cough and sputum production.   Cardiovascular:  Negative for chest pain.  Gastrointestinal:  Negative for diarrhea, nausea and vomiting.  Genitourinary:  Negative for dysuria.  Skin:  Negative for rash.  Neurological:  Negative for headaches.   Allergies  Allergen Reactions   Cefuroxime Axetil Hives    OBJECTIVE: Vitals:   11/18/20 1635 11/18/20 2136 11/19/20 0754 11/19/20 1340  BP: (!) 152/94 (!) 152/81 (!) 164/82 (!) 145/81  Pulse: 74 96 86 99  Resp: 14 16 16 17   Temp:  98.3 F (36.8 C) 98.4 F (36.9 C) 98.3 F (36.8 C)  TempSrc:  Oral Oral Oral  SpO2: 100% 99% 96% 100%  Weight:      Height:       Body mass index is 34.31 kg/m.   Physical Exam Vitals reviewed.  Constitutional:      General: He is not in acute distress.    Appearance: Normal appearance. He is not ill-appearing.  HENT:     Mouth/Throat:     Mouth: Mucous membranes are moist.     Pharynx: Oropharynx  is clear.     Comments: +trismus, improved ability to open but not full range. L temporomandibular region with swelling and some purple discoloration, improved.  Skin:    General: Skin is warm and dry.     Capillary Refill: Capillary refill takes less than 2 seconds.  Neurological:     Mental Status: He is alert and oriented to person, place, and time.    Lab Results Lab Results  Component Value Date   WBC 12.7 (H) 11/19/2020   HGB 12.6 (L) 11/19/2020   HCT 39.2 11/19/2020   MCV 93.3 11/19/2020   PLT 301 11/19/2020    Lab Results  Component Value Date   CREATININE 0.66 11/19/2020   BUN 10 11/19/2020   NA 135 11/19/2020   K 3.8 11/19/2020   CL 103 11/19/2020   CO2 23 11/19/2020    Lab Results  Component Value Date   ALT 37 11/19/2020   AST 14 (L) 11/19/2020   ALKPHOS 71 11/19/2020   BILITOT 1.3 (H) 11/19/2020      Microbiology: Recent Results (from the past 240 hour(s))  Resp Panel by RT-PCR (Flu A&B, Covid) Nasopharyngeal Swab     Status: None   Collection Time: 11/18/20  9:00 AM   Specimen: Nasopharyngeal Swab; Nasopharyngeal(NP) swabs in vial transport medium  Result Value Ref Range Status   SARS Coronavirus 2 by RT PCR NEGATIVE NEGATIVE Final    Comment: (NOTE) SARS-CoV-2 target nucleic acids are NOT DETECTED.  The SARS-CoV-2 RNA is generally detectable in upper respiratory specimens during the acute phase of infection. The lowest concentration of SARS-CoV-2 viral copies this assay can detect is 138 copies/mL. A negative result does not preclude SARS-Cov-2 infection and should not be used as the sole basis for treatment or other patient management decisions. A negative result may occur with  improper specimen collection/handling, submission of specimen other than nasopharyngeal swab, presence of viral mutation(s) within the areas targeted by this assay, and inadequate number of viral copies(<138 copies/mL). A negative result must be combined with clinical observations, patient history, and epidemiological information. The expected result is Negative.  Fact Sheet for Patients:  EntrepreneurPulse.com.au  Fact Sheet for Healthcare Providers:  IncredibleEmployment.be  This test is no t yet approved or cleared by the [place] FDA and  has been authorized for detection and/or diagnosis of SARS-CoV-2 by FDA under an Emergency Use Authorization (EUA). This EUA will remain  in effect (meaning this test can be used) for the duration of the COVID-19 declaration under Section 564(b)(1) of the Act, 21 U.S.C.section 360bbb-3(b)(1), unless the authorization is terminated  or revoked sooner.       Influenza A by PCR NEGATIVE NEGATIVE Final   Influenza B by PCR NEGATIVE NEGATIVE Final    Comment: (NOTE) The Xpert Xpress SARS-CoV-2/FLU/RSV plus assay is  intended as an aid in the diagnosis of influenza from Nasopharyngeal swab specimens and should not be used as a sole basis for treatment. Nasal washings and aspirates are unacceptable for Xpert Xpress SARS-CoV-2/FLU/RSV testing.  Fact Sheet for Patients: EntrepreneurPulse.com.au  Fact Sheet for Healthcare Providers: IncredibleEmployment.be  This test is not yet approved or cleared by the [place] FDA and has been authorized for detection and/or diagnosis of SARS-CoV-2 by FDA under an Emergency Use Authorization (EUA). This EUA will remain in effect (meaning this test can be used) for the duration of the COVID-19 declaration under Section 564(b)(1) of the Act, 21 U.S.C. section 360bbb-3(b)(1), unless the authorization is terminated or revoked.  Performed at [facility], [place] [address] [place], [place] [address]   Aerobic/Anaerobic Culture w Gram Stain (surgical/deep wound)     Status: None (Preliminary result)   Collection Time: 11/18/20  4:20 PM   Specimen: PATH Soft tissue  Result Value Ref Range Status   Specimen Description ABSCESS FACE  Final   Special Requests FROM JAW ASPIRATION  Final   Gram Stain   Final    ABUNDANT WBC PRESENT, PREDOMINANTLY PMN MODERATE GRAM POSITIVE COCCI IN PAIRS IN CLUSTERS    Culture   Final    TOO YOUNG TO READ Performed at [facility], [place] [address], [place],  [address]    Report Status PENDING  Incomplete    Janene Madeira, MSN, NP-C [place] [facility] Infect[facility] [place] Cell: [phone] Pager: [phone]  11/19/2020  1:43 PM    I spent 10 min with the patient

## 2020-11-19 NOTE — Progress Notes (Signed)
PHARMACY CONSULT NOTE FOR:  OUTPATIENT  PARENTERAL ANTIBIOTIC THERAPY (OPAT)  Indication: Osteomyelitis of the jaw Regimen: Ertapenem 1 g IVPB every 24 hours End date: 12/30/2020  IV antibiotic discharge orders are pended. To discharging provider:  please sign these orders via discharge navigator,  Select New Orders & click on the button choice - Manage This Unsigned Work.     Thank you for allowing pharmacy to be a part of this patient's care.  Ursula Beath 11/19/2020, 4:29 PM

## 2020-11-19 NOTE — Plan of Care (Signed)

## 2020-11-19 NOTE — Progress Notes (Signed)
Upper extremity venous LT study completed.  Preliminary results relayed to [facility], MD.  See CV Proc for preliminary results report.   Darlin Coco, RDMS, RVT

## 2020-11-19 NOTE — Hospital Course (Addendum)
60 year old white male COPD hyperlipidemia reflux arthritis (L5 radiculopathy status post PLIF L5-S1 06/10/2020 Dr. Ronnald Ramp) 7-month history of root canal 2 crowns placed by dentist-developed intermittent L jaw swelling trismus-Rx oral ABX 08/2020 temporary improvement Has lost weight secondary to pain Patient referred to oral surgeon and Rx clindamycin/steroids ~11/13/2020 Reported some temporary improvement + recurrence however Maxillofacial CT = osteomyelitis left mandible?  Phlegmon mastication muscles  Oral surgeon Dr. Bo Mcclintock contacted by ED Infectious disease Dr. Drucilla Schmidt consulted- 7/19-underwent ultrasound aspiration left periosteal mandibular abscess yielding 20 mL thick purulent fluid  WBC 18 lactic acid 1 Rx NS + Unasyn   Data BUN/creatinine 10/0.66 WBC 18-->12, hemoglobin 12.6 Bilirubin 1.3 CXR ordered 7/19 negative for pneumonia

## 2020-11-19 NOTE — Progress Notes (Signed)
Initial Nutrition Assessment  DOCUMENTATION CODES:  Severe malnutrition in context of acute illness/injury, Obesity unspecified  INTERVENTION:  Add Boost Plus po TID, each supplement provides 360 kcal and 14 grams of protein.  Add Magic cup TID with meals, each supplement provides 290 kcal and 9 grams of protein.  Continue CIWA protocol.  NUTRITION DIAGNOSIS:  Severe Malnutrition related to acute illness (Jaw osteomyelitis/infection) as evidenced by percent weight loss, energy intake < or equal to 50% for > or equal to 5 days.  GOAL:  Patient will meet greater than or equal to 90% of their needs  MONITOR:  PO intake, Supplement acceptance, Diet advancement, Labs, Weight trends, Skin, I & O's  REASON FOR ASSESSMENT:  Malnutrition Screening Tool    ASSESSMENT:  60 yo male with a PMH of COPD, hyperlipidemia, GERD, and arthritis who presents with L mandible osteomyelitis after a partial root canal and 2 crown placement by his dentist 3 months prior. 7/19 - IR drain (yielded 20 ml of bloody, purulent fluid)  Of note, pt has a very difficult time chewing and mostly takes in liquids.  Spoke with pt at bedside. Pt reports that he has had pain with his jaw for a while. He reports that his usual intake was mostly chocolate milkshakes, as he wanted to get in as many calories as he could think of. He was not taking ONS at home.  Pt endorses a 35-lb weight loss in the past 6 months. Per Epic, pt has lost ~20 lbs (7.7%) of his body weight in 5.5 months, which is significant and severe for the time frame.  Recommend adding Boost Plus TID and Magic Cup TID to promote intake. Pt with EtOH intake in history - recommend continuing CIWA.  Medications: reviewed; folic acid, MVI with minerals, thiamine, NaCl @ 75 ml/hr, Unasyn QID per IV, morphine PRN (given twice today), oxycodone PRN (given twice today)  Labs: reviewed  NUTRITION - FOCUSED PHYSICAL EXAM: Flowsheet Row Most Recent Value   Orbital Region No depletion  Upper Arm Region No depletion  Thoracic and Lumbar Region No depletion  Buccal Region No depletion  Temple Region No depletion  Clavicle Bone Region No depletion  Clavicle and Acromion Bone Region No depletion  Scapular Bone Region No depletion  Dorsal Hand No depletion  Patellar Region No depletion  Anterior Thigh Region No depletion  Posterior Calf Region No depletion  Edema (RD Assessment) Mild  [Facial]  Hair Reviewed  Eyes Reviewed  Mouth Reviewed  Skin Reviewed  Nails Reviewed   Diet Order:   Diet Order             DIET SOFT Room service appropriate? Yes; Fluid consistency: Thin  Diet effective now                  EDUCATION NEEDS:  Education needs have been addressed  Skin:  Skin Assessment: Skin Integrity Issues: Skin Integrity Issues:: Incisions Incisions: L jaw, biopsy puncture site  Last BM:  11/17/20  Height:  Ht Readings from Last 1 Encounters:  11/17/20 5\' 11"  (1.803 m)   Weight:  Wt Readings from Last 1 Encounters:  11/17/20 111.6 kg   BMI:  Body mass index is 34.31 kg/m.  Estimated Nutritional Needs:  Kcal:  2100-2300 Protein:  115-130 grams Fluid:  >2 L  Derrel Nip, RD, LDN (she/her/hers) Registered Dietitian I After-Hours/Weekend Pager # [phone]

## 2020-11-19 NOTE — Progress Notes (Signed)
PROGRESS NOTE   [name]  VOJ:[id number] DOB: 1960-06-26 DOA: 11/17/2020 PCP: [name], MD  Brief Narrative:   60 year old white male COPD hyperlipidemia reflux arthritis (L5 radiculopathy status post PLIF L5-S1 06/10/2020 Dr. Ronnald Ramp) 5-month history of root canal 2 crowns placed by dentist-developed intermittent L jaw swelling trismus-Rx oral ABX 08/2020 temporary improvement Has lost weight secondary to pain Patient referred to oral surgeon and Rx clindamycin/steroids ~11/13/2020 Reported some temporary improvement + recurrence however Maxillofacial CT = osteomyelitis left mandible?  Phlegmon mastication muscles  Oral surgeon Dr. Bo Mcclintock contacted by ED Infectious disease Dr. Drucilla Schmidt consulted-  7/19-underwent ultrasound aspiration left periosteal mandibular abscess yielding 20 mL thick purulent fluid  WBC 18 lactic acid 1 Rx NS + Unasyn  Hospital-Problem based course  Osteomyelitis + abscess acute- -IR drainage performed 7/19, follow cultures, continue Unasyn IV, saline 75 cc/H-leukocytosis is significantly improved from admission -First choice for pain oxycodone every 6 as needed, IV morphine every 2 as needed for severe pain -I discussed this patient's case with Dr. Mariel Sleet of general dentistry who concurs that there is no further operative management that is needed  Transaminitis -Likely reactive and trending downwards at this time  COPD without oxygen requirement, current half pack per day smoker smoker -Continue albuterol nebs: Mucinex -Chest x-ray negative for any acute process  Ethanolism -Drinks 12 beers per week -Ciwa not being trended? -Monitor closely  Cephalic vein SVT at antecubital region left side -Does not need anticoagulation-empiric management  DVT prophylaxis: Lovenox Code Status: Full Family Communication: None Disposition:  Status is: Inpatient  Remains inpatient appropriate because:Hemodynamically unstable, Unsafe d/c plan, and IV  treatments appropriate due to intensity of illness or inability to take PO  Dispo: The patient is from: Home              Anticipated d/c is to: Home              Patient currently is not medically stable to d/c.   Difficult to place patient No       Consultants:  Interventional radiology Dr. Laurence Ferrari Dr. Burke Keels General dentistry  Procedures:   7/19-underwent ultrasound aspiration left periosteal mandibular abscess yielding 20 mL thick purulent fluid  Antimicrobials:  Unasyn from admission   Subjective: Ambulatory in no distress seems to be comfortable no pain no fever no chills no cough no cold  Objective: Vitals:   11/18/20 1625 11/18/20 1635 11/18/20 2136 11/19/20 0754  BP: (!) 172/78 (!) 152/94 (!) 152/81 (!) 164/82  Pulse: 88 74 96 86  Resp: 14 14 16 16   Temp:   98.3 F (36.8 C) 98.4 F (36.9 C)  TempSrc:   Oral Oral  SpO2: 100% 100% 99% 96%  Weight:      Height:        Intake/Output Summary (Last 24 hours) at 11/19/2020 1025 Last data filed at 11/19/2020 0609 Gross per 24 hour  Intake 240 ml  Output 20 ml  Net 220 ml   Filed Weights   11/17/20 1625  Weight: 111.6 kg    Examination:  EOMI NCAT no focal deficit CTA B no rales rhonchi S1-S2 no murmur no rub no gallop abdomen obese nontender nondistended Upper extremities soft nontender slightly brawny edema to the left upper extremity No lower extremity edema Neurologically intact  Data Reviewed: personally reviewed   CBC    Component Value Date/Time   WBC 12.7 (H) 11/19/2020 0026   RBC 4.20 (L) 11/19/2020 0026   HGB 12.6 (L)  11/19/2020 0026   HCT 39.2 11/19/2020 0026   PLT 301 11/19/2020 0026   MCV 93.3 11/19/2020 0026   MCH 30.0 11/19/2020 0026   MCHC 32.1 11/19/2020 0026   RDW 15.8 (H) 11/19/2020 0026   LYMPHSABS 2.9 11/17/2020 1639   MONOABS 1.5 (H) 11/17/2020 1639   EOSABS 0.1 11/17/2020 1639   BASOSABS 0.1 11/17/2020 1639   CMP Latest Ref Rng & Units 11/19/2020 11/17/2020  11/17/2020  Glucose 70 - 99 mg/dL 86 70 107(H)  BUN 6 - 20 mg/dL 10 17 16   Creatinine 0.61 - 1.24 mg/dL 0.66 0.74 0.64  Sodium 135 - 145 mmol/L 135 134(L) 137  Potassium 3.5 - 5.1 mmol/L 3.8 4.2 4.5  Chloride 98 - 111 mmol/L 103 98 101  CO2 22 - 32 mmol/L 23 27 26   Calcium 8.9 - 10.3 mg/dL 8.7(L) 9.5 9.2  Total Protein 6.5 - 8.1 g/dL 5.8(L) - 6.6  Total Bilirubin 0.3 - 1.2 mg/dL 1.3(H) - 0.8  Alkaline Phos 38 - 126 U/L 71 - 92  AST 15 - 41 U/L 14(L) - 21  ALT 0 - 44 U/L 37 - 58(H)     Radiology Studies: DG Chest 2 View  Result Date: 11/18/2020 CLINICAL DATA:  Productive cough. EXAM: CHEST - 2 VIEW COMPARISON:  06/03/2020. FINDINGS: Mediastinum hilar structures normal. Lungs are clear. No pleural effusion or pneumothorax. Heart size normal. Old right posterior eighth rib fracture again noted. Prior cervicothoracic spine fusion. Diffuse osteopenia degenerative change thoracic spine. IMPRESSION: No acute cardiopulmonary disease. Electronically Signed   By: Marcello Moores  Register   On: 11/18/2020 10:49   CT Soft Tissue Neck W Contrast  Result Date: 11/17/2020 CLINICAL DATA:  Swelling of the left face. Previous dental infections treated with antibiotics. EXAM: CT NECK WITH CONTRAST TECHNIQUE: Multidetector CT imaging of the neck was performed using the standard protocol following the bolus administration of intravenous contrast. CONTRAST:  86mL OMNIPAQUE IOHEXOL 300 MG/ML  SOLN COMPARISON:  Maxillofacial CT same day. FINDINGS: Pharynx and larynx: No mucosal or submucosal lesion. Salivary glands: Parotid and submandibular glands are normal. Thyroid: Normal Lymph nodes: No enlarged or low-density nodes on either side of the neck Vascular: No vascular pathology. Limited intracranial: Normal Visualized orbits: Normal Mastoids and visualized paranasal sinuses: Clear Skeleton: As noted at the maxillofacial CT, there is osteomyelitis of the left mandibular ramus, coronoid process and angle with surrounding  phlegmonous inflammation or abscess affecting the muscles of mastication. See that report for complete description. Elsewhere in the neck, there has been distant ACDF C5 through C7 with solid union. Some adjacent segment degenerative changes noted at C4-5 and C7-T1 Upper chest: Negative Other: Fluid in the mastoid air cells on the left. IMPRESSION: See results of maxillofacial CT. Osteomyelitis of the left mandible with surrounding phlegmonous inflammation or abscess on both sides affecting the muscles of mastication. Otherwise no soft tissue lesion seen in the neck. No evidence of deep space extension of the infection. Left mastoid effusion. Electronically Signed   By: Nelson Chimes M.D.   On: 11/17/2020 11:53   IR US Guide Bx Asp/Drain  Result Date: 11/18/2020 INDICATION: 60 year-old male with left subperiosteal mandibular abscess. Request made to aspirate. EXAM: Ultrasound-guided aspiration MEDICATIONS: The patient is currently admitted to the hospital and receiving intravenous antibiotics. The antibiotics were administered within an appropriate time frame prior to the initiation of the procedure. ANESTHESIA/SEDATION: Fentanyl 100 mcg IV; Versed 2 mg IV Moderate Sedation Time:  13 minutes The patient was continuously monitored  during the procedure by the interventional radiology nurse under my direct supervision. COMPLICATIONS: None immediate. PROCEDURE: Informed written consent was obtained from the patient after a thorough discussion of the procedural risks, benefits and alternatives. All questions were addressed. Maximal Sterile Barrier Technique was utilized including caps, mask, sterile gowns, sterile gloves, sterile drape, hand hygiene and skin antiseptic. A timeout was performed prior to the initiation of the procedure. Ultrasound was used to interrogate the left mandibular space. A hypoechoic fluid collection is visualized within the superior ostial space surrounding the mandibular ramus. The skin was  sterilely prepped and draped in the standard fashion using chlorhexidine skin prep. Local anesthesia was attained by infiltration with 1% lidocaine. A small dermatotomy was made with an 18 gauge needle. An 18 gauge trocar needle was then advanced into the subperiosteal space. Aspiration was then performed yielding approximately 22 mL of thick bloody purulent fluid. Samples were sent for Gram stain and culture. The trocar needle was removed. Hemostasis attained by manual pressure. IMPRESSION: Successful aspiration of approximately 22 mL of thick bloody purulent fluid from subperiosteal abscess surrounding the left mandibular ramus. Electronically Signed   By: Jacqulynn Cadet M.D.   On: 11/18/2020 16:51   CT Maxillofacial W/Cm  Result Date: 11/17/2020 CLINICAL DATA:  Maxillofacial pain and swelling on the left over the last 2-3 months. Symptoms began after dental work and improved after antibiotics. Question parotid disease. EXAM: CT MAXILLOFACIAL WITH CONTRAST TECHNIQUE: Multidetector CT imaging of the maxillofacial structures was performed with intravenous contrast. Multiplanar CT image reconstructions were also generated. CONTRAST:  58mL OMNIPAQUE IOHEXOL 300 MG/ML  SOLN COMPARISON:  None. FINDINGS: There is rarefaction of the left mandible affecting the ramus, coronoid process and angle of the mandible consistent with osteomyelitis. No frank bone destruction. There is low-density of the soft tissues on both sides of the mandible, with involvement of the muscles of mastication. This could be due to phlegmonous inflammation or frank abscess/myositis. This inflammatory low-density does not appear to track into any other deep space. Elsewhere, the actual dental work of left mandible appears the consist of a root canal the last molar in a crown of the tooth adjacent to that. There is no evidence of root abscess at those levels. Both parotid glands are normal. Both submandibular glands are normal. No evidence of  regional adenopathy. IMPRESSION: Osteomyelitis of the left mandible affecting the ramus, coronoid process and angle of the mandible. Surrounding low-density material could be phlegmonous inflammation or frank abscess and involves the muscles of mastication. Electronically Signed   By: Nelson Chimes M.D.   On: 11/17/2020 11:50     Scheduled Meds:  enoxaparin (LOVENOX) injection  40 mg Subcutaneous Daily   folic acid  1 mg Oral Daily   guaiFENesin  600 mg Oral BID   multivitamin with minerals  1 tablet Oral Daily   nicotine  14 mg Transdermal Daily   sodium chloride flush  3 mL Intravenous Q12H   thiamine  100 mg Oral Daily   Or   thiamine  100 mg Intravenous Daily   Continuous Infusions:  ampicillin-sulbactam (UNASYN) IV 3 g (11/19/20 0609)     LOS: 1 day   Time spent: [place], MD [facility] To contact the attending provider between 7A-7P or the covering provider during after hours 7P-7A, please log into the web site [email or web address] and access using universal [place] password for that web site. If you do not have the password, please call the hospital operator.  11/19/2020, 10:25 AM

## 2020-11-20 ENCOUNTER — Other Ambulatory Visit: Payer: Self-pay

## 2020-11-20 DIAGNOSIS — Z720 Tobacco use: Secondary | ICD-10-CM

## 2020-11-20 DIAGNOSIS — M542 Cervicalgia: Secondary | ICD-10-CM | POA: Diagnosis not present

## 2020-11-20 DIAGNOSIS — E43 Unspecified severe protein-calorie malnutrition: Secondary | ICD-10-CM | POA: Insufficient documentation

## 2020-11-20 DIAGNOSIS — J42 Unspecified chronic bronchitis: Secondary | ICD-10-CM | POA: Diagnosis not present

## 2020-11-20 DIAGNOSIS — M272 Inflammatory conditions of jaws: Secondary | ICD-10-CM | POA: Diagnosis not present

## 2020-11-20 DIAGNOSIS — I82619 Acute embolism and thrombosis of superficial veins of unspecified upper extremity: Secondary | ICD-10-CM | POA: Diagnosis not present

## 2020-11-20 LAB — HEPATITIS C ANTIBODY: HCV Ab: NONREACTIVE

## 2020-11-20 LAB — HEPATITIS B SURFACE ANTIGEN: Hepatitis B Surface Ag: NONREACTIVE

## 2020-11-20 MED ORDER — HEPARIN SOD (PORK) LOCK FLUSH 100 UNIT/ML IV SOLN
250.0000 [IU] | INTRAVENOUS | Status: AC | PRN
  Administered 2020-11-20: 250 [IU]
  Filled 2020-11-20: qty 2.5

## 2020-11-20 MED ORDER — SODIUM CHLORIDE 0.9% FLUSH: 10.0000 mL | INTRAVENOUS | Status: DC | PRN

## 2020-11-20 MED ORDER — ERTAPENEM IV (FOR PTA / DISCHARGE USE ONLY): 1.0000 g | INTRAVENOUS | 0 refills | Status: AC

## 2020-11-20 MED ORDER — THIAMINE HCL 100 MG PO TABS: 100.0000 mg | ORAL_TABLET | Freq: Every day | ORAL | 0 refills | Status: DC

## 2020-11-20 MED ORDER — SODIUM CHLORIDE 0.9% FLUSH: 10.0000 mL | Freq: Two times a day (BID) | INTRAVENOUS | Status: DC

## 2020-11-20 MED ORDER — CHLORHEXIDINE GLUCONATE CLOTH 2 % EX PADS
6.0000 | MEDICATED_PAD | Freq: Every day | CUTANEOUS | Status: DC
  Administered 2020-11-20: 6 via TOPICAL

## 2020-11-20 MED ORDER — NICOTINE 14 MG/24HR TD PT24: 14.0000 mg | MEDICATED_PATCH | Freq: Every day | TRANSDERMAL | 0 refills | Status: DC

## 2020-11-20 NOTE — Care Management Important Message (Signed)
Important Message  Patient Details  Name: [name] MRN: [id number] Date of Birth: July 21, 1960   Medicare Important Message Given:  Yes     [name] [address] 11/20/2020, 4:23 PM

## 2020-11-20 NOTE — Plan of Care (Signed)

## 2020-11-20 NOTE — Discharge Summary (Signed)
Physician Discharge Summary  [name] PJS:[id number] DOB: June 03, 1960 DOA: 11/17/2020  PCP: [name], MD  Admit date: 11/17/2020 Discharge date: 11/20/2020  Time spent: 33 minutes  Recommendations for Outpatient Follow-up:  Needs outpatient close follow-up with his oral surgeon Dr.Gallenhorn and consideration for follow-up with infectious disease Dr. Drucilla Schmidt Will need CBC Chem-12 in about 1 week Home health ordered and recommended on discharge for teaching of Invanz use  Discharge Diagnoses:  MAIN problem for hospitalization   osteomyelitis of jaw  Please see below for itemized issues addressed in [place]- refer to other progress notes for clarity if needed  Discharge Condition: Improved  Diet recommendation: Soft  Filed Weights   11/17/20 1625  Weight: 111.6 kg    History of present illness:  60 year old white male COPD hyperlipidemia reflux arthritis (L5 radiculopathy status post PLIF L5-S1 06/10/2020 Dr. Ronnald Ramp) 57-monthhistory of root canal 2 crowns placed by dentist-developed intermittent L jaw swelling trismus-Rx oral ABX 08/2020 temporary improvement Has lost weight secondary to pain Patient referred to oral surgeon and Rx clindamycin/steroids ~11/13/2020 Reported some temporary improvement + recurrence however Maxillofacial CT = osteomyelitis left mandible?  Phlegmon mastication muscles   Oral surgeon Dr. GBo Mcclintockcontacted by ED Infectious disease Dr. VDrucilla Schmidtconsulted-   7/19-underwent ultrasound aspiration left periosteal mandibular abscess yielding 20 mL thick purulent fluid  WBC 18 lactic acid 1 Rx NS + Unasyn  Hospital Course:  Osteomyelitis + abscess acute- -IR drainage performed 7/19, follow cultures as an outpatient - [facility] the [facility] and will need this for at least 6 weeks and then potential reimaging and follow-up with oral surgeon/infectious disease-Home health  will come out to educate and supervise him -First choice for pain oxycodone every 6 as needed and patient was discharged home on this (already had prescriptions of this for his back pain which will help with his mouth pain -I discussed this patient's case with Dr. FMariel Sleetof general dentistry who concurs that there is no further operative management that is needed -Patient will follow up with his oral surgeon to discuss the next steps in treatment although suspect nonoperative management   transaminitis -Likely reactive?  Related to underlying ethanolism and trending downwards at this time   COPD without oxygen requirement, current half pack per day smoker smoker -Continue albuterol nebs: Mucinex -Chest x-ray negative for any acute process   Ethanolism -Drinks 12 beers per week - Patient was stable during hospital stay   Cephalic vein SVT at antecubital region left side -Does not need anticoagulation-empiric management  Procedures: IR drainage 7/19  Consultations: Interventional radiology 7/19 Infectious disease 7/19  Discharge Exam: Vitals:   11/19/20 2053 11/20/20 0508  BP: (!) 146/82 (!) 142/82  Pulse: 78 78  Resp: 18 17  Temp: 98.4 F (36.9 C) 98.2 F (36.8 C)  SpO2: 99% 99%    Subj on day of d/c   Awake alert coherent no distress eating although some mild discomfort at the left side of mouth Ambulatory no fever no chills no nausea no vomiting  General Exam on discharge  EOMI NCAT no focal deficit Slight swelling to left jaw Chest clear no rales no rhonchi No submandibular lymphadenopathy cannot appreciate thyromegaly ROM intact Abdomen soft nontender no rebound no guarding No lower extremity edema  Discharge Instructions   Discharge Instructions     [facility] pharmacist to adjust dose for Vancomycin, Aminoglycosides and other anti-infective therapies as requested by  physician.   Complete by: As directed    [facility] to provide  Cath Flo 70m   Complete by: As directed    Administer for PICC line occlusion and as ordered by physician for other access device issues.   Anaphylaxis Kit: Provided to treat any anaphylactic reaction to the medication being provided to the patient if First Dose or when requested by physician   Complete by: As directed    Epinephrine 160mml vial / amp: Administer 0.71m36m0.71ml54mubcutaneously once for moderate to severe anaphylaxis, nurse to call physician and pharmacy when reaction occurs and call 911 if needed for immediate care   Diphenhydramine 50mg60mIV vial: Administer 25-50mg 26mM PRN for first dose reaction, rash, itching, mild reaction, nurse to call physician and pharmacy when reaction occurs   Sodium Chloride 0.9% NS 500ml I771mdminister if needed for hypovolemic blood pressure drop or as ordered by physician after call to physician with anaphylactic reaction   Change dressing on IV access line weekly and PRN   Complete by: As directed    Diet - low sodium heart healthy   Complete by: As directed    Discharge instructions   Complete by: As directed    Finish antibioitcs Follow with primary MD and dentist-- Follow with Dr. Van damLucianne Leiu will need labs in ~ 1 week   Flush IV access with Sodium Chloride 0.9% and Heparin 10 units/ml or 100 units/ml   Complete by: As directed    Home infusion instructions - [facility]   Complete by: As directed    Instructions: Flush IV access with Sodium Chloride 0.9% and Heparin 10units/ml or 100units/ml   Change dressing on IV access line: Weekly and PRN   Instructions Cath Flo 2mg: Ad42mister for PICC Line occlusion and as ordered by physician for other access device   [facility] pharmacist to adjust dose for: Vancomycin, Aminoglycosides and other anti-infective therapies as requested by physician   Increase activity slowly   Complete by: As directed    Method of administration may be changed at the discretion of home  infusion pharmacist based upon assessment of the patient and/or caregiver's ability to self-administer the medication ordered   Complete by: As directed    No wound care   Complete by: As directed       Allergies as of 11/20/2020       Reactions   Cefuroxime Axetil Hives        Medication List     STOP taking these medications    methylPREDNISolone 4 MG Tbpk tablet Commonly known as: MEDROL DOSEPAK       TAKE these medications    acetaminophen 325 MG tablet Commonly known as: TYLENOL Take 650 mg by mouth every 6 (six) hours as needed for mild pain, fever or headache.   albuterol 108 (90 Base) MCG/ACT inhaler Commonly known as: VENTOLIN HFA INHALE 1-2 PUFFS INTO THE LUNGS EVERY 6 (SIX) HOURS AS NEEDED FOR WHEEZING OR SHORTNESS OF BREATH.   atorvastatin 10 MG tablet Commonly known as: LIPITOR TAKE 1 TABLET BY MOUTH EVERY DAY   DULoxetine 60 MG capsule Commonly known as: CYMBALTA Take 60 mg by mouth at bedtime.   ertapenem  IVPB Commonly known as: INVANZ Inject 1 g into the vein daily. Indication:  Osteomyelitis of the jaw First Dose: Yes Last Day of Therapy:  12/30/20 Labs - Once weekly:  CBC/D and BMP, Labs - Every other week:  ESR and CRP Method  of administration: Mini-Bag Plus / Gravity Method of administration may be changed at the discretion of home infusion pharmacist based upon assessment of the patient and/or caregiver's ability to self-administer the medication ordered.   gabapentin 300 MG capsule Commonly known as: NEURONTIN Take 1 capsule (300 mg total) by mouth 3 (three) times daily. What changed: when to take this   meloxicam 15 MG tablet Commonly known as: MOBIC Take 1 tablet (15 mg total) by mouth daily.   nicotine 14 mg/24hr patch Commonly known as: NICODERM CQ - dosed in mg/24 hours Place 1 patch (14 mg total) onto the skin daily. Start taking on: November 21, 2020   Oxycodone HCl 10 MG Tabs Take 1-2 tablets (10-20 mg total) by mouth  every 4 (four) hours as needed (pain).   sodium chloride flush 0.9 % Soln Commonly known as: NS 10-40 mLs by Intracatheter route as needed (flush).   thiamine 100 MG tablet Take 1 tablet (100 mg total) by mouth daily. Start taking on: November 21, 2020   tiZANidine 4 MG tablet Commonly known as: ZANAFLEX Take 4 mg by mouth 3 (three) times daily.               Discharge Care Instructions  (From admission, onward)           Start     Ordered   11/20/20 0000  Change dressing on IV access line weekly and PRN  (Home infusion instructions - [facility] )        11/20/20 1236           Allergies  Allergen Reactions   Cefuroxime Axetil Hives    Follow-up Information     [facility] Follow up.   Why: Antibiotic therapy will be provided by [facility]. Questions please call [place] Follow up.   Why: [facility] will privide your home health RN. Start of care wil be 11/21/2020. Contact information: [phone]                 The results of significant diagnostics from this hospitalization (including imaging, microbiology, ancillary and laboratory) are listed below for reference.    Significant Diagnostic Studies: DG Chest 2 View  Result Date: 11/18/2020 CLINICAL DATA:  Productive cough. EXAM: CHEST - 2 VIEW COMPARISON:  06/03/2020. FINDINGS: Mediastinum hilar structures normal. Lungs are clear. No pleural effusion or pneumothorax. Heart size normal. Old right posterior eighth rib fracture again noted. Prior cervicothoracic spine fusion. Diffuse osteopenia degenerative change thoracic spine. IMPRESSION: No acute cardiopulmonary disease. Electronically Signed   By: Marcello Moores  Register   On: 11/18/2020 10:49   CT Soft Tissue Neck W Contrast  Result Date: 11/17/2020 CLINICAL DATA:  Swelling of the left face. Previous dental infections treated with antibiotics. EXAM: CT NECK WITH CONTRAST TECHNIQUE:  Multidetector CT imaging of the neck was performed using the standard protocol following the bolus administration of intravenous contrast. CONTRAST:  7m OMNIPAQUE IOHEXOL 300 MG/ML  SOLN COMPARISON:  Maxillofacial CT same day. FINDINGS: Pharynx and larynx: No mucosal or submucosal lesion. Salivary glands: Parotid and submandibular glands are normal. Thyroid: Normal Lymph nodes: No enlarged or low-density nodes on either side of the neck Vascular: No vascular pathology. Limited intracranial: Normal Visualized orbits: Normal Mastoids and visualized paranasal sinuses: Clear Skeleton: As noted at the maxillofacial CT, there is osteomyelitis of the left mandibular ramus, coronoid process and angle with surrounding phlegmonous inflammation or abscess affecting the  muscles of mastication. See that report for complete description. Elsewhere in the neck, there has been distant ACDF C5 through C7 with solid union. Some adjacent segment degenerative changes noted at C4-5 and C7-T1 Upper chest: Negative Other: Fluid in the mastoid air cells on the left. IMPRESSION: See results of maxillofacial CT. Osteomyelitis of the left mandible with surrounding phlegmonous inflammation or abscess on both sides affecting the muscles of mastication. Otherwise no soft tissue lesion seen in the neck. No evidence of deep space extension of the infection. Left mastoid effusion. Electronically Signed   By: Nelson Chimes M.D.   On: 11/17/2020 11:53   IR US Guide Bx Asp/Drain  Result Date: 11/18/2020 INDICATION: 60 year-old male with left subperiosteal mandibular abscess. Request made to aspirate. EXAM: Ultrasound-guided aspiration MEDICATIONS: The patient is currently admitted to the hospital and receiving intravenous antibiotics. The antibiotics were administered within an appropriate time frame prior to the initiation of the procedure. ANESTHESIA/SEDATION: Fentanyl 100 mcg IV; Versed 2 mg IV Moderate Sedation Time:  13 minutes The patient  was continuously monitored during the procedure by the interventional radiology nurse under my direct supervision. COMPLICATIONS: None immediate. PROCEDURE: Informed written consent was obtained from the patient after a thorough discussion of the procedural risks, benefits and alternatives. All questions were addressed. Maximal Sterile Barrier Technique was utilized including caps, mask, sterile gowns, sterile gloves, sterile drape, hand hygiene and skin antiseptic. A timeout was performed prior to the initiation of the procedure. Ultrasound was used to interrogate the left mandibular space. A hypoechoic fluid collection is visualized within the superior ostial space surrounding the mandibular ramus. The skin was sterilely prepped and draped in the standard fashion using chlorhexidine skin prep. Local anesthesia was attained by infiltration with 1% lidocaine. A small dermatotomy was made with an 18 gauge needle. An 18 gauge trocar needle was then advanced into the subperiosteal space. Aspiration was then performed yielding approximately 22 mL of thick bloody purulent fluid. Samples were sent for Gram stain and culture. The trocar needle was removed. Hemostasis attained by manual pressure. IMPRESSION: Successful aspiration of approximately 22 mL of thick bloody purulent fluid from subperiosteal abscess surrounding the left mandibular ramus. Electronically Signed   By: Jacqulynn Cadet M.D.   On: 11/18/2020 16:51   CT Maxillofacial W/Cm  Result Date: 11/17/2020 CLINICAL DATA:  Maxillofacial pain and swelling on the left over the last 2-3 months. Symptoms began after dental work and improved after antibiotics. Question parotid disease. EXAM: CT MAXILLOFACIAL WITH CONTRAST TECHNIQUE: Multidetector CT imaging of the maxillofacial structures was performed with intravenous contrast. Multiplanar CT image reconstructions were also generated. CONTRAST:  51m OMNIPAQUE IOHEXOL 300 MG/ML  SOLN COMPARISON:  None. FINDINGS:  There is rarefaction of the left mandible affecting the ramus, coronoid process and angle of the mandible consistent with osteomyelitis. No frank bone destruction. There is low-density of the soft tissues on both sides of the mandible, with involvement of the muscles of mastication. This could be due to phlegmonous inflammation or frank abscess/myositis. This inflammatory low-density does not appear to track into any other deep space. Elsewhere, the actual dental work of left mandible appears the consist of a root canal the last molar in a crown of the tooth adjacent to that. There is no evidence of root abscess at those levels. Both parotid glands are normal. Both submandibular glands are normal. No evidence of regional adenopathy. IMPRESSION: Osteomyelitis of the left mandible affecting the ramus, coronoid process and angle of the mandible. Surrounding  low-density material could be phlegmonous inflammation or frank abscess and involves the muscles of mastication. Electronically Signed   By: Nelson Chimes M.D.   On: 11/17/2020 11:50   VAS [place] UPPER EXTREMITY VENOUS DUPLEX  Result Date: 11/19/2020 UPPER VENOUS STUDY  Patient Name:  DEREN DEGRAZIA  Date of Exam:   11/19/2020 Medical Rec #: [id number]           Accession #:    [id number] Date of Birth: [birth year]           Patient Gender: M Patient Age:   59Y Exam Location:  [facility] Procedure:      VAS [place] UPPER EXTREMITY VENOUS DUPLEX Referring Phys: [id number] CORNELIUS N VAN DAM --------------------------------------------------------------------------------  Indications: Swelling LT face/neck Comparison Study: 11-17-2020 CT showed osteomyelitis of the LT mandible. Performing Technologist: Darlin Coco RDMS,RVT  Examination Guidelines: A complete evaluation includes B-mode imaging, spectral Doppler, color Doppler, and power Doppler as needed of all accessible portions of each vessel. Bilateral testing is considered an integral part of a complete  examination. Limited examinations for reoccurring indications may be performed as noted.  Right Findings: +----------+------------+---------+-----------+----------+-------+ RIGHT     CompressiblePhasicitySpontaneousPropertiesSummary +----------+------------+---------+-----------+----------+-------+ IJV           Full                                          +----------+------------+---------+-----------+----------+-------+ Subclavian               Yes       Yes                      +----------+------------+---------+-----------+----------+-------+  Left Findings: +----------+------------+---------+-----------+----------+---------------------+ LEFT      CompressiblePhasicitySpontaneousProperties       Summary        +----------+------------+---------+-----------+----------+---------------------+ IJV           Full       Yes       Yes                                    +----------+------------+---------+-----------+----------+---------------------+ Subclavian    Full       Yes       Yes                                    +----------+------------+---------+-----------+----------+---------------------+ Axillary      Full       Yes       Yes                                    +----------+------------+---------+-----------+----------+---------------------+ Brachial      Full                                  Duplicated at distal                                                      upper  arm- all patent +----------+------------+---------+-----------+----------+---------------------+ Radial        Full                                                        +----------+------------+---------+-----------+----------+---------------------+ Ulnar         Full                                                        +----------+------------+---------+-----------+----------+---------------------+ Cephalic    Partial      Yes       Yes      s/p IV           Acute         +----------+------------+---------+-----------+----------+---------------------+ Basilic       Full                                                        +----------+------------+---------+-----------+----------+---------------------+  Summary:  Right: No evidence of thrombosis in the subclavian.  Left: No evidence of deep vein thrombosis in the upper extremity. Findings consistent with acute superficial vein thrombosis involving the left cephalic vein at the level of the antecubital.  *See table(s) above for measurements and observations.     Preliminary    [place] EKG SITE RITE  Result Date: 11/19/2020 If Site Rite image not attached, placement could not be confirmed due to current cardiac rhythm.   Microbiology: Recent Results (from the past 240 hour(s))  Resp Panel by RT-PCR (Flu A&B, Covid) Nasopharyngeal Swab     Status: None   Collection Time: 11/18/20  9:00 AM   Specimen: Nasopharyngeal Swab; Nasopharyngeal(NP) swabs in vial transport medium  Result Value Ref Range Status   SARS Coronavirus 2 by RT PCR NEGATIVE NEGATIVE Final    Comment: (NOTE) SARS-CoV-2 target nucleic acids are NOT DETECTED.  The SARS-CoV-2 RNA is generally detectable in upper respiratory specimens during the acute phase of infection. The lowest concentration of SARS-CoV-2 viral copies this assay can detect is 138 copies/mL. A negative result does not preclude SARS-Cov-2 infection and should not be used as the sole basis for treatment or other patient management decisions. A negative result may occur with  improper specimen collection/handling, submission of specimen other than nasopharyngeal swab, presence of viral mutation(s) within the areas targeted by this assay, and inadequate number of viral copies(<138 copies/mL). A negative result must be combined with clinical observations, patient history, and epidemiological information. The expected result is Negative.  Fact Sheet for Patients:   EntrepreneurPulse.com.au  Fact Sheet for Healthcare Providers:  IncredibleEmployment.be  This test is no t yet approved or cleared by the [place] FDA and  has been authorized for detection and/or diagnosis of SARS-CoV-2 by FDA under an Emergency Use Authorization (EUA). This EUA will remain  in effect (meaning this test can be used) for the duration of the COVID-19 declaration under Section 564(b)(1) of the Act, 21 U.S.C.section 360bbb-3(b)(1), unless the authorization is terminated  or revoked sooner.  Influenza A by PCR NEGATIVE NEGATIVE Final   Influenza B by PCR NEGATIVE NEGATIVE Final    Comment: (NOTE) The Xpert Xpress SARS-CoV-2/FLU/RSV plus assay is intended as an aid in the diagnosis of influenza from Nasopharyngeal swab specimens and should not be used as a sole basis for treatment. Nasal washings and aspirates are unacceptable for Xpert Xpress SARS-CoV-2/FLU/RSV testing.  Fact Sheet for Patients: EntrepreneurPulse.com.au  Fact Sheet for Healthcare Providers: IncredibleEmployment.be  This test is not yet approved or cleared by the [place] FDA and has been authorized for detection and/or diagnosis of SARS-CoV-2 by FDA under an Emergency Use Authorization (EUA). This EUA will remain in effect (meaning this test can be used) for the duration of the COVID-19 declaration under Section 564(b)(1) of the Act, 21 U.S.C. section 360bbb-3(b)(1), unless the authorization is terminated or revoked.  Performed at [facility], [place] [address], [place], [place] [address]   Aerobic/Anaerobic Culture w Gram Stain (surgical/deep wound)     Status: None (Preliminary result)   Collection Time: 11/18/20  4:20 PM   Specimen: PATH Soft tissue  Result Value Ref Range Status   Specimen Description ABSCESS FACE  Final   Special Requests FROM JAW ASPIRATION  Final   Gram Stain   Final     ABUNDANT WBC PRESENT, PREDOMINANTLY PMN MODERATE GRAM POSITIVE COCCI IN PAIRS IN CLUSTERS Performed at [facility], [address], [place], [place] [address]    Culture FEW STREPTOCOCCUS INTERMEDIUS  Final   Report Status PENDING  Incomplete     Labs: Basic Metabolic Panel: Recent Labs  Lab 11/17/20 1009 11/17/20 1639 11/19/20 0026  NA 137 134* 135  K 4.5 4.2 3.8  CL 101 98 103  CO2 _0 GLUCOSE 107* 70 86  BUN _1 CREATININE 0.64 0.74 0.66  CALCIUM 9.2 9.5 8.7*  MG  --   --  2.0  PHOS  --   --  3.0   Liver Function Tests: Recent Labs  Lab 11/17/20 1009 11/19/20 0026  AST 21 14*  ALT 58* 37  ALKPHOS 92 71  BILITOT 0.8 1.3*  PROT 6.6 5.8*  ALBUMIN 4.1 3.0*   No results for input(s): LIPASE, AMYLASE in the last 168 hours. No results for input(s): AMMONIA in the last 168 hours. CBC: Recent Labs  Lab 11/17/20 1009 11/17/20 1639 11/19/20 0026  WBC 17.9* 18.5* 12.7*  NEUTROABS 15.2* 13.4*  --   HGB 14.6 14.9 12.6*  HCT 43.2 45.8 39.2  MCV 90.3 94.2 93.3  PLT 322.0 342 301   Cardiac Enzymes: No results for input(s): CKTOTAL, CKMB, CKMBINDEX, TROPONINI in the last 168 hours. BNP: BNP (last 3 results) No results for input(s): BNP in the last 8760 hours.  ProBNP (last 3 results) No results for input(s): PROBNP in the last 8760 hours.  CBG: No results for input(s): GLUCAP in the last 168 hours.     Signed:  Nita Sells MD   [facility] 11/20/2020, 12:40 PM

## 2020-11-20 NOTE — Plan of Care (Signed)
  Problem: Education: Goal: Knowledge of General Education information will improve Description: Including pain rating scale, medication(s)/side effects and non-pharmacologic comfort measures 11/20/2020 1415 by Trixie Deis, RN Outcome: Adequate for Discharge 11/20/2020 1019 by Trixie Deis, RN Outcome: Progressing   Problem: Health Behavior/Discharge Planning: Goal: Ability to manage health-related needs will improve Outcome: Adequate for Discharge   Problem: Clinical Measurements: Goal: Ability to maintain clinical measurements within normal limits will improve Outcome: Adequate for Discharge Goal: Will remain free from infection Outcome: Adequate for Discharge Goal: Diagnostic test results will improve Outcome: Adequate for Discharge Goal: Respiratory complications will improve Outcome: Adequate for Discharge Goal: Cardiovascular complication will be avoided Outcome: Adequate for Discharge   Problem: Activity: Goal: Risk for activity intolerance will decrease 11/20/2020 1415 by Trixie Deis, RN Outcome: Adequate for Discharge 11/20/2020 1019 by Trixie Deis, RN Outcome: Progressing   Problem: Nutrition: Goal: Adequate nutrition will be maintained Outcome: Adequate for Discharge   Problem: Coping: Goal: Level of anxiety will decrease Outcome: Adequate for Discharge   Problem: Elimination: Goal: Will not experience complications related to bowel motility Outcome: Adequate for Discharge Goal: Will not experience complications related to urinary retention Outcome: Adequate for Discharge   Problem: Pain Managment: Goal: General experience of comfort will improve 11/20/2020 1415 by Trixie Deis, RN Outcome: Adequate for Discharge 11/20/2020 1019 by Trixie Deis, RN Outcome: Progressing   Problem: Safety: Goal: Ability to remain free from injury will improve 11/20/2020 1415 by Trixie Deis, RN Outcome: Adequate for Discharge 11/20/2020 1019 by  Trixie Deis, RN Outcome: Progressing   Problem: Skin Integrity: Goal: Risk for impaired skin integrity will decrease 11/20/2020 1415 by Trixie Deis, RN Outcome: Adequate for Discharge 11/20/2020 1019 by Trixie Deis, RN Outcome: Progressing   Problem: Malnutrition  (NI-5.2) Goal: Food and/or nutrient delivery Description: Individualized approach for food/nutrient provision. Outcome: Adequate for Discharge

## 2020-11-20 NOTE — Progress Notes (Signed)
Peripherally Inserted Central Catheter Placement  The IV Nurse has discussed with the patient and/or persons authorized to consent for the patient, the purpose of this procedure and the potential benefits and risks involved with this procedure.  The benefits include less needle sticks, lab draws from the catheter, and the patient may be discharged home with the catheter. Risks include, but not limited to, infection, bleeding, blood clot (thrombus formation), and puncture of an artery; nerve damage and irregular heartbeat and possibility to perform a PICC exchange if needed/ordered by physician.  Alternatives to this procedure were also discussed.  Bard Power PICC patient education guide, fact sheet on infection prevention and patient information card has been provided to patient /or left at bedside.    PICC Placement Documentation  PICC Single Lumen [id number] Right Basilic 44 cm 0 cm (Active)  Indication for Insertion or Continuance of Line Home intravenous therapies (PICC only) 11/20/20 0900  Exposed Catheter (cm) 0 cm 11/20/20 0900  Site Assessment Clean;Dry;Intact 11/20/20 0900  Line Status Flushed;Blood return noted 11/20/20 0900  Dressing Type Transparent 11/20/20 0900  Dressing Status Clean;Dry;Intact 11/20/20 0900  Antimicrobial disc in place? Yes 11/20/20 0900  Dressing Change Due 11/27/20 11/20/20 0900       Jule Economy Horton 11/20/2020, 9:30 AM

## 2020-11-20 NOTE — TOC Initial Note (Signed)
Transition of Care [facility]) - Initial/Assessment Note    Patient Details  Name: [name] MRN: [id number] Date of Birth: 11/18/1960  [facility] CM/SW Contact:    Sharin Mons, RN Phone Number: 11/20/2020, 8:26 AM  Clinical Narrative:       Admitted with severe mandibular osteomyelitis. From home with wife. PTA independent with ADL's, no DME usage.          Pt will need LT IV ABX therapy @ d/c.  OUTPATIENT  PARENTERAL ANTIBIOTIC THERAPY (OPAT)  Indication: Osteomyelitis of the jaw Regimen: Ertapenem 1 g IVPB every 24 hours End date: 12/30/2020  NCM spoke with pt regarding d/c planning need, home IV infusion. Pt agreeable. States wife will assist with home therapy. Referral made with [facility]  ( ABX therapy) and [facility]) and accepted. Home infusion teaching with wife will be @ 9am at pt's bedside today.  PICC line pending....  TOC team will continue to monitor and assist with [facility]  needs  Expected Discharge Plan: [place] Barriers to Discharge: Continued Medical Work up   Patient Goals and CMS Choice     Choice offered to / list presented to : Patient  Expected Discharge Plan and Services Expected Discharge Plan: [place]   Discharge Planning Services: CM Consult   Living arrangements for the past 2 months: Single Family Home                 DME Arranged: Other see comment (IV ABX THERAPY) DME Agency: Other - Comment ([facility]) Date DME Agency Contacted: 11/19/20 Time DME Agency Contacted: 28   HH Arranged: RN [place] Agency: Other - See comment ([place])        Prior Living Arrangements/Services Living arrangements for the past 2 months: Single Family Home Lives with:: Spouse   Do you feel safe going back to the place where you live?: Yes      Need for Family Participation in Patient Care: Yes (Comment) Care giver support system in place?: Yes  (comment)   Criminal Activity/Legal Involvement Pertinent to Current Situation/Hospitalization: No - Comment as needed  Activities of Daily Living Home Assistive Devices/Equipment: None ADL Screening (condition at time of admission) Patient's cognitive ability adequate to safely complete daily activities?: Yes Is the patient deaf or have difficulty hearing?: No Does the patient have difficulty seeing, even when wearing glasses/contacts?: No Does the patient have difficulty concentrating, remembering, or making decisions?: No Patient able to express need for assistance with ADLs?: Yes Does the patient have difficulty dressing or bathing?: No Independently performs ADLs?: Yes (appropriate for developmental age) Does the patient have difficulty walking or climbing stairs?: No Weakness of Legs: None Weakness of Arms/Hands: None  Permission Sought/Granted   Permission granted to share information with : Yes, Verbal Permission Granted              Emotional Assessment Appearance:: Appears stated age Attitude/Demeanor/Rapport: Engaged Affect (typically observed): Accepting Orientation: : Oriented to Self, Oriented to Place, Oriented to  Time, Oriented to Situation   Psych Involvement: No (comment)  Admission diagnosis:  Cough [R05.9] Acute osteomyelitis of mandible [M27.2] Patient Active Problem List   Diagnosis Date Noted   Protein-calorie malnutrition, severe 54/62/7035   Cephalic vein thrombosis    Osteomyelitis, jaw acute 11/18/2020   Cough 11/18/2020   COPD with acute bronchitis ([place]) 11/18/2020   Abscess of mandible 11/18/2020  Leukocytosis 11/18/2020   Chronic obstructive pulmonary disease (HCC)    Neck swelling 11/17/2020   Neck pain on left side 11/17/2020   Left-sided face pain 11/17/2020   Swelling of left side of face 11/17/2020   Paresthesia of foot 10/21/2020   Infected tick bite of buttock 10/21/2020   Loss of transverse plantar arch 10/02/2020    Trochanteric bursitis of both hips 09/23/2020   Hallux rigidus of both feet 08/21/2020   Disc displacement, lumbar 07/09/2020   Pseudoarthrosis of lumbar spine 04/10/2020   Degenerative joint disease of knee, left 02/26/2020   Rash 02/21/2020   Other chronic pain 02/12/2020   Throat pain in adult    Peritonsillar abscess    Paresthesia of skin 01/08/2020   Numbness and tingling of foot 01/08/2020   Trochanteric bursitis, left hip 09/25/2019   S/P lumbar fusion 09/10/2019   Elevated blood-pressure reading, without diagnosis of hypertension 08/20/2019   Neck pain 05/17/2019   Greater trochanteric pain syndrome 04/03/2019   Fibromatosis of plantar fascia 11/29/2018   Pronation deformity of both feet 11/01/2018   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   Plantar fasciitis 09/27/2018   Left hip pain 09/27/2018   Microhematuria 09/27/2018   Chronic cough 09/27/2018   Alcohol abuse 09/27/2018   Numbness of upper limb 05/15/2018   Low testosterone 12/10/2015   Polymyalgia ([place]) 11/11/2015   Easy bruising 10/30/2015   Bursitis of right shoulder 09/30/2015   Hyperglycemia 09/26/2015   Hyperlipidemia 09/26/2015   History of colonic polyps 09/26/2015   Acute medial meniscal tear 09/09/2015   Left knee pain 08/28/2015   Disorder of sacroiliac joint 06/05/2015   Shoulder pain 02/06/2015   Status post lumbar spine operation 04/02/2014   Dehiscence of surgical wound 03/18/2014   S/P lumbar spinal fusion 02/14/2014   Low back pain 02/11/2014   Hip pain 11/13/2013   Body mass index (BMI) 38.0-38.9, adult 07/12/2013   OA (osteoarthritis) 03/07/2012   Fatigue 03/07/2012   Preventative health care 09/03/2010   Tobacco abuse 09/03/2010   PCP:  [name], MD Pharmacy:   [id number] - [place], [place] [address] [place] [address] Phone: [phone] Fax: [phone]     Social Determinants of Health (SDOH) Interventions    Readmission Risk  Interventions No flowsheet data found.

## 2020-11-20 NOTE — Progress Notes (Addendum)
Subjective:   No new complaints   Antibiotics:  Anti-infectives (From admission, onward)    Start     Dose/Rate Route Frequency Ordered Stop   11/19/20 1500  ertapenem (INVANZ) 1,000 mg in sodium chloride 0.9 % 100 mL IVPB        1 g 200 mL/hr over 30 Minutes Intravenous Every 24 hours 11/19/20 1406     11/18/20 1700  Ampicillin-Sulbactam (UNASYN) 3 g in sodium chloride 0.9 % 100 mL IVPB  Status:  Discontinued        3 g 200 mL/hr over 30 Minutes Intravenous Every 6 hours 11/18/20 1028 11/19/20 1406   11/18/20 0930  Ampicillin-Sulbactam (UNASYN) 3 g in sodium chloride 0.9 % 100 mL IVPB        3 g 200 mL/hr over 30 Minutes Intravenous  Once 11/18/20 0918 11/18/20 1128       Medications: Scheduled Meds:  Chlorhexidine Gluconate Cloth  6 each Topical Daily   enoxaparin (LOVENOX) injection  40 mg Subcutaneous Daily   folic acid  1 mg Oral Daily   guaiFENesin  600 mg Oral BID   lactose free nutrition  237 mL Oral TID WC   multivitamin with minerals  1 tablet Oral Daily   nicotine  14 mg Transdermal Daily   sodium chloride flush  10-40 mL Intracatheter Q12H   sodium chloride flush  3 mL Intravenous Q12H   thiamine  100 mg Oral Daily   Or   thiamine  100 mg Intravenous Daily   Continuous Infusions:  sodium chloride 75 mL/hr at 11/20/20 0957   ertapenem 1,000 mg (11/19/20 1544)   PRN Meds:.acetaminophen **OR** acetaminophen, LORazepam **OR** LORazepam, morphine injection, ondansetron **OR** ondansetron (ZOFRAN) IV, oxyCODONE, sodium chloride flush, tiZANidine    Objective: Weight change:   Intake/Output Summary (Last 24 hours) at 11/20/2020 1131 Last data filed at 11/19/2020 1500 Gross per 24 hour  Intake 240 ml  Output --  Net 240 ml   Blood pressure (!) 142/82, pulse 78, temperature 98.2 F (36.8 C), temperature source Oral, resp. rate 17, height _0  (1.803 m), weight 111.6 kg, SpO2 99 %. Temp:  [98.2 F (36.8 C)-98.4 F (36.9 C)] 98.2 F (36.8 C)  (07/21 0508) Pulse Rate:  [78-99] 78 (07/21 0508) Resp:  [17-18] 17 (07/21 0508) BP: (142-146)/(81-82) 142/82 (07/21 0508) SpO2:  [99 %-100 %] 99 % (07/21 0508)  Physical Exam: Physical Exam Constitutional:      Appearance: He is well-developed.  HENT:     Head: Normocephalic and atraumatic.  Eyes:     Extraocular Movements: Extraocular movements intact.     Conjunctiva/sclera: Conjunctivae normal.  Cardiovascular:     Rate and Rhythm: Normal rate and regular rhythm.  Pulmonary:     Effort: Pulmonary effort is normal. No respiratory distress.     Breath sounds: Normal breath sounds. No stridor. No wheezing.  Abdominal:     General: There is no distension.     Palpations: Abdomen is soft.  Musculoskeletal:        General: Normal range of motion.     Cervical back: Normal range of motion and neck supple.  Skin:    General: Skin is warm and dry.     Findings: No erythema or rash.  Neurological:     General: No focal deficit present.     Mental Status: He is alert and oriented to person, place, and time.  Psychiatric:  Mood and Affect: Mood normal.        Behavior: Behavior normal.        Thought Content: Thought content normal.        Judgment: Judgment normal.     CBC:    BMET Recent Labs    11/17/20 1639 11/19/20 0026  NA 134* 135  K 4.2 3.8  CL 98 103  CO2 27 23  GLUCOSE 70 86  BUN 17 10  CREATININE 0.74 0.66  CALCIUM 9.5 8.7*     Liver Panel  Recent Labs    11/19/20 0026  PROT 5.8*  ALBUMIN 3.0*  AST 14*  ALT 37  ALKPHOS 71  BILITOT 1.3*       Sedimentation Rate No results for input(s): ESRSEDRATE in the last 72 hours. C-Reactive Protein No results for input(s): CRP in the last 72 hours.  Micro Results: Recent Results (from the past 720 hour(s))  Resp Panel by RT-PCR (Flu A&B, Covid) Nasopharyngeal Swab     Status: None   Collection Time: 11/18/20  9:00 AM   Specimen: Nasopharyngeal Swab; Nasopharyngeal(NP) swabs in vial  transport medium  Result Value Ref Range Status   SARS Coronavirus 2 by RT PCR NEGATIVE NEGATIVE Final    Comment: (NOTE) SARS-CoV-2 target nucleic acids are NOT DETECTED.  The SARS-CoV-2 RNA is generally detectable in upper respiratory specimens during the acute phase of infection. The lowest concentration of SARS-CoV-2 viral copies this assay can detect is 138 copies/mL. A negative result does not preclude SARS-Cov-2 infection and should not be used as the sole basis for treatment or other patient management decisions. A negative result may occur with  improper specimen collection/handling, submission of specimen other than nasopharyngeal swab, presence of viral mutation(s) within the areas targeted by this assay, and inadequate number of viral copies(<138 copies/mL). A negative result must be combined with clinical observations, patient history, and epidemiological information. The expected result is Negative.  Fact Sheet for Patients:  EntrepreneurPulse.com.au  Fact Sheet for Healthcare Providers:  IncredibleEmployment.be  This test is no t yet approved or cleared by the [place] FDA and  has been authorized for detection and/or diagnosis of SARS-CoV-2 by FDA under an Emergency Use Authorization (EUA). This EUA will remain  in effect (meaning this test can be used) for the duration of the COVID-19 declaration under Section 564(b)(1) of the Act, 21 U.S.C.section 360bbb-3(b)(1), unless the authorization is terminated  or revoked sooner.       Influenza A by PCR NEGATIVE NEGATIVE Final   Influenza B by PCR NEGATIVE NEGATIVE Final    Comment: (NOTE) The Xpert Xpress SARS-CoV-2/FLU/RSV plus assay is intended as an aid in the diagnosis of influenza from Nasopharyngeal swab specimens and should not be used as a sole basis for treatment. Nasal washings and aspirates are unacceptable for Xpert Xpress SARS-CoV-2/FLU/RSV testing.  Fact  Sheet for Patients: EntrepreneurPulse.com.au  Fact Sheet for Healthcare Providers: IncredibleEmployment.be  This test is not yet approved or cleared by the [place] FDA and has been authorized for detection and/or diagnosis of SARS-CoV-2 by FDA under an Emergency Use Authorization (EUA). This EUA will remain in effect (meaning this test can be used) for the duration of the COVID-19 declaration under Section 564(b)(1) of the Act, 21 U.S.C. section 360bbb-3(b)(1), unless the authorization is terminated or revoked.  Performed at [facility], [place] [address], [place], [place] [address]   Aerobic/Anaerobic Culture w Gram Stain (surgical/deep wound)     Status: None (  Preliminary result)   Collection Time: 11/18/20  4:20 PM   Specimen: PATH Soft tissue  Result Value Ref Range Status   Specimen Description ABSCESS FACE  Final   Special Requests FROM JAW ASPIRATION  Final   Gram Stain   Final    ABUNDANT WBC PRESENT, PREDOMINANTLY PMN MODERATE GRAM POSITIVE COCCI IN PAIRS IN CLUSTERS Performed at [facility], [address], [place], [place] [address]    Culture FEW STREPTOCOCCUS INTERMEDIUS  Final   Report Status PENDING  Incomplete    Studies/Results: IR US Guide Bx Asp/Drain  Result Date: 11/18/2020 INDICATION: 60 year-old male with left subperiosteal mandibular abscess. Request made to aspirate. EXAM: Ultrasound-guided aspiration MEDICATIONS: The patient is currently admitted to the hospital and receiving intravenous antibiotics. The antibiotics were administered within an appropriate time frame prior to the initiation of the procedure. ANESTHESIA/SEDATION: Fentanyl 100 mcg IV; Versed 2 mg IV Moderate Sedation Time:  13 minutes The patient was continuously monitored during the procedure by the interventional radiology nurse under my direct supervision. COMPLICATIONS: None immediate. PROCEDURE: Informed written consent was obtained  from the patient after a thorough discussion of the procedural risks, benefits and alternatives. All questions were addressed. Maximal Sterile Barrier Technique was utilized including caps, mask, sterile gowns, sterile gloves, sterile drape, hand hygiene and skin antiseptic. A timeout was performed prior to the initiation of the procedure. Ultrasound was used to interrogate the left mandibular space. A hypoechoic fluid collection is visualized within the superior ostial space surrounding the mandibular ramus. The skin was sterilely prepped and draped in the standard fashion using chlorhexidine skin prep. Local anesthesia was attained by infiltration with 1% lidocaine. A small dermatotomy was made with an 18 gauge needle. An 18 gauge trocar needle was then advanced into the subperiosteal space. Aspiration was then performed yielding approximately 22 mL of thick bloody purulent fluid. Samples were sent for Gram stain and culture. The trocar needle was removed. Hemostasis attained by manual pressure. IMPRESSION: Successful aspiration of approximately 22 mL of thick bloody purulent fluid from subperiosteal abscess surrounding the left mandibular ramus. Electronically Signed   By: Jacqulynn Cadet M.D.   On: 11/18/2020 16:51   VAS [place] UPPER EXTREMITY VENOUS DUPLEX  Result Date: 11/19/2020 UPPER VENOUS STUDY  Patient Name:  [name]  Date of Exam:   11/19/2020 Medical Rec #: [id number]           Accession #:    [id number] Date of Birth: 1960/09/22           Patient Gender: M Patient Age:   59Y Exam Location:  [facility] Procedure:      VAS [place] UPPER EXTREMITY VENOUS DUPLEX Referring Phys: [id number] Ricahrd Schwager N VAN DAM --------------------------------------------------------------------------------  Indications: Swelling LT face/neck Comparison Study: 11-17-2020 CT showed osteomyelitis of the LT mandible. Performing Technologist: Darlin Coco RDMS,RVT  Examination Guidelines: A complete evaluation includes  B-mode imaging, spectral Doppler, color Doppler, and power Doppler as needed of all accessible portions of each vessel. Bilateral testing is considered an integral part of a complete examination. Limited examinations for reoccurring indications may be performed as noted.  Right Findings: +----------+------------+---------+-----------+----------+-------+ RIGHT     CompressiblePhasicitySpontaneousPropertiesSummary +----------+------------+---------+-----------+----------+-------+ IJV           Full                                          +----------+------------+---------+-----------+----------+-------+  Subclavian               Yes       Yes                      +----------+------------+---------+-----------+----------+-------+  Left Findings: +----------+------------+---------+-----------+----------+---------------------+ LEFT      CompressiblePhasicitySpontaneousProperties       Summary        +----------+------------+---------+-----------+----------+---------------------+ IJV           Full       Yes       Yes                                    +----------+------------+---------+-----------+----------+---------------------+ Subclavian    Full       Yes       Yes                                    +----------+------------+---------+-----------+----------+---------------------+ Axillary      Full       Yes       Yes                                    +----------+------------+---------+-----------+----------+---------------------+ Brachial      Full                                  Duplicated at distal                                                      upper arm- all patent +----------+------------+---------+-----------+----------+---------------------+ Radial        Full                                                        +----------+------------+---------+-----------+----------+---------------------+ Ulnar         Full                                                         +----------+------------+---------+-----------+----------+---------------------+ Cephalic    Partial      Yes       Yes      s/p IV          Acute         +----------+------------+---------+-----------+----------+---------------------+ Basilic       Full                                                        +----------+------------+---------+-----------+----------+---------------------+  Summary:  Right: No evidence of thrombosis in the subclavian.  Left: No  evidence of deep vein thrombosis in the upper extremity. Findings consistent with acute superficial vein thrombosis involving the left cephalic vein at the level of the antecubital.  *See table(s) above for measurements and observations.     Preliminary    [place] EKG SITE RITE  Result Date: 11/19/2020 If Site Rite image not attached, placement could not be confirmed due to current cardiac rhythm.     Assessment/Plan:  INTERVAL HISTORY: Patient continues to improve PICC line has been placed   Principal Problem:   Osteomyelitis, jaw acute Active Problems:   Tobacco abuse   Neck pain on left side   Cough   Chronic obstructive pulmonary disease (HCC)   COPD with acute bronchitis (HCC)   Abscess of mandible   Leukocytosis   Cephalic vein thrombosis   Protein-calorie malnutrition, severe    [name] is a 59 y.o. male with 60 year old man with history of COPD, smoking infection of root canal now presents with odontogenic infection with osteomyelitis of the jaw and abscess in the muscles of mastication status post IR guided drainage of abscess with culture sent.   #1  Osteomyelitis of the jaw:  --Switch to Invanz --- Follow culture We will give him 6 weeks of parenteral antibiotics followed by chronic oral antibiotics.  He should see his oral maxillofacial surgeon when he gets out of the hospital and surgeon she can touch with me if there are any plans for further surgical  intervention.  #2 okay we helpful if he did stop smoking for overall health but also being able to better heal the wounds  #3  Neck pain has improved he did not have a jugular vein thrombosis fortunately does have a cephalic vein clot dental fight on ultrasound that I ordered at spot where he had phlebotomy performed on arrival to the ER.  I spent 36 minutes with the patient including > 50% OF face to face counseling of the patient during the nature of osteomyelitis in particular the jaw the treatment course duration reviewing the nature of IV antibiotics at home and how they were given and how he will be taught how to do this by home health company, reviewing his abscess cultures that were taken yesterday CBC CMP in review of medical records before and during the visit and in coordination of his care with home health.  Diagnosis: Osteomyelitis of the jaw  Culture Results: Cultures pending  Allergies  Allergen Reactions   Cefuroxime Axetil Hives    OPAT Orders Discharge antibiotics:  Invanz 1 gram daily   Duration: 6 weeks  End Date:   [facility] Care Per Protocol:  Labs  weekly while on IV antibiotics: x__ CBC with differential __x BMP w GFR/CMP __xx CRP __ ESR    _x_ Please pull PIC at completion of IV antibiotics __ Please leave PIC in place until doctor has seen patient or been notified  Fax weekly labs to [phone]  Clinic Follow Up Appt:  [name] has an appointment on 12/18/2020 at 230 PM with Dr. Tommy Medal   The [facility] is located in the [facility] at   [place] in [place].   [address], which is located to the left of the elevators.   Phone: [phone]   Fax: [phone]   [email or web address]   He should arrive 15 to 30 minutes prior to his appointment.  We will sign off for now but I will follow-up his culture  data.     LOS: 2 days   Alcide Evener 11/20/2020, 11:31 AM

## 2020-11-21 DIAGNOSIS — M272 Inflammatory conditions of jaws: Secondary | ICD-10-CM | POA: Diagnosis not present

## 2020-11-21 LAB — HEPATITIS B SURFACE ANTIBODY, QUANTITATIVE: Hep B S AB Quant (Post): <3.1 m[IU]/mL — ABNORMAL LOW (ref >9.9)

## 2020-11-23 LAB — AEROBIC/ANAEROBIC CULTURE W GRAM STAIN (SURGICAL/DEEP WOUND)

## 2020-11-24 DIAGNOSIS — M272 Inflammatory conditions of jaws: Secondary | ICD-10-CM | POA: Diagnosis not present

## 2020-11-29 DIAGNOSIS — M272 Inflammatory conditions of jaws: Secondary | ICD-10-CM | POA: Diagnosis not present

## 2020-12-01 DIAGNOSIS — M272 Inflammatory conditions of jaws: Secondary | ICD-10-CM | POA: Diagnosis not present

## 2020-12-04 NOTE — Progress Notes (Signed)
[name]  D.O. [facility] [address] [place] [address] Phone: [phone] Subjective:   I, [name], am serving as a scribe for Dr.  .  This visit occurred during the SARS-CoV-2 public health emergency.  Safety protocols were in place, including screening questions prior to the visit, additional usage of staff PPE, and extensive cleaning of exam room while observing appropriate contact time as indicated for disinfecting solutions.    I'm seeing this patient by the request  of:  [name], Richard W, MD  CC: Foot pain follow-up  HPI:Subjective  10/28/2020 Patient has had fibromatosis of the plantar fascia before.  Discussed with patient icing regimen and home exercises.  Patient is doing well with the over-the-counter orthotics.  Patient's ABIs were almost completely unremarkable except for some mild decrease in blood flow into the large toe.  Still think it is within normal range.  Patient will continue with conservative therapy but will be fitted for custom orthotics that I think will be beneficial.  Discussed which activities to do which wants to avoid.  Follow-up again in 6 to 8 weeks  Update 12/05/2020 [name] is a 60 y.o. male coming in with complaint of B foot pain. Patient states no change in pain since last visit. Noticed swelling of feet 2 nights ago. Does not feel custom orthotics have helped.  Patient continues to have discomfort and pain.  States that now having more swelling because of his recent illnesses.  Did review Patient has been put on antibiotics for osteomyelitis.  Is doing better but continues to have difficulty.  Has had the swelling in the feet only since he has been on the antibiotics.  Patient did have a Doppler done that was independently visualized by me showing only a one-sided superficial blood clot but otherwise unremarkable.  Patient though is concerned because nothing seems to be helping his foot pain.  Wanting to know  what the next steps are.      Past Medical History:  Diagnosis Date   Allergy    Anxiety    Arthritis    Back   Chronic back pain    Colon polyps    COPD (chronic obstructive pulmonary disease) (HCC)    Depression    GERD (gastroesophageal reflux disease)    every now and then   Headache    Hyperlipidemia 09/26/2015   Lumbar surgical wound fluid collection    lumbar wound dehiscence   Pneumonia 2020   Past Surgical History:  Procedure Laterality Date   BACK SURGERY     COLONOSCOPY     COLONOSCOPY [name]/ BIOPSIES AND POLYPECTOMY     EYE SURGERY     IR US GUIDE BX ASP/DRAIN  11/18/2020   KNEE ARTHROSCOPY WITH MEDIAL MENISECTOMY Left 01/23/2016   Procedure: KNEE ARTHROSCOPY WITH MEDIAL MENISECTOMY;  Surgeon: Richard Dalldorf, MD;  Location: [facility];  Service: Orthopedics;  Laterality: Left;   LUMBAR LAMINECTOMY/DECOMPRESSION MICRODISCECTOMY N/A 06/09/2020   Procedure: Decompressive laminectomy Lumbarone -two;  Surgeon: [name], Richard S, MD;  Location: [facility] OR;  Service: Neurosurgery;  Laterality: N/A;   LUMBAR WOUND DEBRIDEMENT N/A 03/21/2014   Procedure: LUMBAR WOUND DEBRIDEMENT;  Surgeon: Richard S Jones, MD;  Location: [facility];  Service: Neurosurgery;  Laterality: N/A;   MAXIMUM ACCESS (MAS)POSTERIOR LUMBAR INTERBODY FUSION (PLIF) 1 LEVEL N/A 02/14/2014   Procedure: FOR MAXIMUM ACCESS SURGERY POSTERIOR LUMBAR INTERBODY FUSION LUMBAR TWO-THREE;  Surgeon: Richard S Jones, MD;  Location: [facility];    Service: Neurosurgery;  Laterality: N/A;   NECK SURGERY     SHOULDER SURGERY     SPINE SURGERY     TONSILLECTOMY     Social History   Socioeconomic History   Marital status: Married    Spouse name: [name]   Number of children: Not on file   Years of education: Not on file   Highest education level: Not on file  Occupational History   Occupation: produce manager/ disability    Employer: [employer]  Tobacco Use   Smoking status: Every Day    Packs/day: 0.50     Years: 44.00    Pack years: 22.00    Types: Cigarettes   Smokeless tobacco: Current    Types: Snuff  Vaping Use   Vaping Use: Never used  Substance and Sexual Activity   Alcohol use: Yes    Alcohol/week: 24.0 standard drinks    Types: 24 Standard drinks or equivalent per week    Comment: social beer   Drug use: No   Sexual activity: Yes    Partners: Female  Other Topics Concern   Not on file  Social History Narrative   Exercise--no   Social Determinants of Health   Financial Resource Strain: Low Risk    Difficulty of Paying Living Expenses: Not hard at all  Food Insecurity: No Food Insecurity   Worried About Running Out of Food in the Last Year: Never true   Ran Out of Food in the Last Year: Never true  Transportation Needs: No Transportation Needs   Lack of Transportation (Medical): No   Lack of Transportation (Non-Medical): No  Physical Activity: Inactive   Days of Exercise per Week: 0 days   Minutes of Exercise per Session: 0 min  Stress: No Stress Concern Present   Feeling of Stress : Not at all  Social Connections: Not on file   Allergies  Allergen Reactions   Cefuroxime Axetil Hives   Family History  Problem Relation Age of Onset   Colon cancer Father 65       pt thinks father had colon cancer   Stomach cancer Father    Hypertension Mother    Colon polyps Brother    Aneurysm Maternal Aunt 58       brain   Heart disease Maternal Aunt        d 69   Stomach cancer Paternal Uncle 57   Aneurysm Maternal Grandfather        stomach   Colon cancer Maternal Grandfather        thinks grandfather had colon cancer   Stomach cancer Paternal Grandfather 85   Depression Brother 20   Suicidality Brother    Stomach cancer Paternal Uncle 72   Esophageal cancer Neg Hx    Rectal cancer Neg Hx    Diabetes Neg Hx      Current Outpatient Medications (Cardiovascular):    atorvastatin (LIPITOR) 10 MG tablet, TAKE 1 TABLET BY MOUTH EVERY DAY  Current Outpatient  Medications (Respiratory):    albuterol (VENTOLIN HFA) 108 (90 Base) MCG/ACT inhaler, INHALE 1-2 PUFFS INTO THE LUNGS EVERY 6 (SIX) HOURS AS NEEDED FOR WHEEZING OR SHORTNESS OF BREATH.  Current Outpatient Medications (Analgesics):    acetaminophen (TYLENOL) 325 MG tablet, Take 650 mg by mouth every 6 (six) hours as needed for mild pain, fever or headache.   meloxicam (MOBIC) 15 MG tablet, Take 1 tablet (15 mg total) by mouth daily.   Oxycodone HCl 10 MG TABS, Take   1-2 tablets (10-20 mg total) by mouth every 4 (four) hours as needed (pain).   Current Outpatient Medications (Other):    DULoxetine (CYMBALTA) 60 MG capsule, Take 60 mg by mouth at bedtime.   ertapenem (INVANZ) IVPB, Inject 1 g into the vein daily. Indication:  Osteomyelitis of the jaw First Dose: Yes Last Day of Therapy:  12/30/20 Labs - Once weekly:  CBC/D and BMP, Labs - Every other week:  ESR and CRP Method of administration: Mini-Bag Plus / Gravity Method of administration may be changed at the discretion of home infusion pharmacist based upon assessment of the patient and/or caregiver's ability to self-administer the medication ordered.   gabapentin (NEURONTIN) 300 MG capsule, Take 1 capsule (300 mg total) by mouth 3 (three) times daily.   nicotine (NICODERM CQ - DOSED IN MG/24 HOURS) 14 mg/24hr patch, Place 1 patch (14 mg total) onto the skin daily.   sodium chloride flush (NS) 0.9 % SOLN, 10-40 mLs by Intracatheter route as needed (flush).   thiamine 100 MG tablet, Take 1 tablet (100 mg total) by mouth daily.   tiZANidine (ZANAFLEX) 4 MG tablet, Take 4 mg by mouth 3 (three) times daily.    Reviewed prior external information including notes and imaging from  primary care provider As well as notes that were available from care everywhere and other healthcare systems.  Past medical history, social, surgical and family history all reviewed in electronic medical record.  No pertanent information unless stated regarding to the  chief complaint.   Review of Systems:  No headache, visual changes, nausea, vomiting, diarrhea, constipation, dizziness, abdominal pain, skin rash, fevers, chills, night sweats, weight loss, swollen lymph nodes, chest pain, shortness of breath, mood changes. POSITIVE muscle aches, body aches, joint swelling  Objective  Blood pressure 122/76, pulse 79, height 5' 11" (1.803 m), weight 256 lb (116.1 kg), SpO2 99 %.   General: No apparent distress alert and oriented x3 mood and affect normal, dressed appropriately.  HEENT: Pupils equal, extraocular movements intact  Respiratory: Patient's speak in full sentences and does not appear short of breath  Cardiovascular: 1+ lower extremity edema to mid calf, non tender, no erythema  Gait antalgic Questionable neuropathy noted of the face.  Patient does have significant swelling.  Difficult to do exam today.  Seems to be neurovascularly intact superficial.   Impression and Recommendations:     The above documentation has been reviewed and is accurate and complete  M , DO    

## 2020-12-05 ENCOUNTER — Encounter: Payer: Self-pay | Admitting: Neurology

## 2020-12-05 ENCOUNTER — Ambulatory Visit: Payer: Self-pay

## 2020-12-05 ENCOUNTER — Ambulatory Visit: Payer: Medicare Other | Admitting: Family Medicine

## 2020-12-05 ENCOUNTER — Encounter: Payer: Self-pay | Admitting: Family Medicine

## 2020-12-05 ENCOUNTER — Other Ambulatory Visit: Payer: Self-pay

## 2020-12-05 VITALS — BP 122/76 | HR 79 | Ht 71.0 in | Wt 256.0 lb

## 2020-12-05 DIAGNOSIS — M79671 Pain in right foot: Secondary | ICD-10-CM

## 2020-12-05 DIAGNOSIS — R202 Paresthesia of skin: Secondary | ICD-10-CM | POA: Diagnosis not present

## 2020-12-05 DIAGNOSIS — M79672 Pain in left foot: Secondary | ICD-10-CM | POA: Diagnosis not present

## 2020-12-05 NOTE — Assessment & Plan Note (Signed)
Difficult to tell why patient continues to have significant foot pain.  Patient's ABIs have been fairly unremarkable except for very mild superficial blood clot.  Do not think that this would be contributing.  At this point we will get patient to have nerve conduction studies to further evaluate to see if this is more secondary to his chronic back discomfort or potentially even peripheral neuropathy with patient's other medical comorbidities.  Patient will continue the good shoes, and continue to try the custom orthotics.  Follow-up with me again 6 to 8 weeks

## 2020-12-05 NOTE — Patient Instructions (Signed)
EMG- They will call you Continue gabapentin and Cymbalta In long run if EMG is normal and continue to have pain will consider injection when done with antibiotics Have f/u in 6 weeks but will have information before that Elevate legs above heart when possible

## 2020-12-07 DIAGNOSIS — M272 Inflammatory conditions of jaws: Secondary | ICD-10-CM | POA: Diagnosis not present

## 2020-12-08 DIAGNOSIS — M272 Inflammatory conditions of jaws: Secondary | ICD-10-CM | POA: Diagnosis not present

## 2020-12-11 ENCOUNTER — Other Ambulatory Visit: Payer: Self-pay | Admitting: Internal Medicine

## 2020-12-15 DIAGNOSIS — M272 Inflammatory conditions of jaws: Secondary | ICD-10-CM | POA: Diagnosis not present

## 2020-12-16 DIAGNOSIS — M272 Inflammatory conditions of jaws: Secondary | ICD-10-CM | POA: Diagnosis not present

## 2020-12-18 ENCOUNTER — Encounter: Payer: Self-pay | Admitting: Infectious Disease

## 2020-12-18 ENCOUNTER — Other Ambulatory Visit: Payer: Self-pay

## 2020-12-18 ENCOUNTER — Ambulatory Visit: Payer: Medicare Other | Admitting: Infectious Disease

## 2020-12-18 ENCOUNTER — Telehealth: Payer: Self-pay

## 2020-12-18 VITALS — BP 144/88 | HR 87 | Wt 257.0 lb

## 2020-12-18 DIAGNOSIS — I82619 Acute embolism and thrombosis of superficial veins of unspecified upper extremity: Secondary | ICD-10-CM

## 2020-12-18 DIAGNOSIS — M272 Inflammatory conditions of jaws: Secondary | ICD-10-CM

## 2020-12-18 DIAGNOSIS — F1010 Alcohol abuse, uncomplicated: Secondary | ICD-10-CM

## 2020-12-18 DIAGNOSIS — Z981 Arthrodesis status: Secondary | ICD-10-CM | POA: Diagnosis not present

## 2020-12-18 DIAGNOSIS — R220 Localized swelling, mass and lump, head: Secondary | ICD-10-CM | POA: Diagnosis not present

## 2020-12-18 DIAGNOSIS — Z720 Tobacco use: Secondary | ICD-10-CM | POA: Diagnosis not present

## 2020-12-18 MED ORDER — AMOXICILLIN-POT CLAVULANATE 875-125 MG PO TABS: 1.0000 | ORAL_TABLET | Freq: Two times a day (BID) | ORAL | 5 refills | Status: DC

## 2020-12-18 NOTE — Telephone Encounter (Signed)
Thanks Dorie!

## 2020-12-18 NOTE — Progress Notes (Signed)
Subjective:  Chief complaint: Some tenderness on his lateral side of his cheek on his face   Patient ID: [name], male    DOB: 1960/07/04, 60 y.o.   MRN: [id number]  HPI  60 year old with hx of history of lumbar surgery COPD smoking who had a root canal that then became infected odontogenic infection which spread with osteomyelitis of the jaw and an abscess in the muscles of mastication status post IR guided drainage of abscess with culture sent which yielded Streptococcus Intermediums which was PCN Sensitive.   We saw him in the hospital and set him up with IV ertapenem which he is continued on.  He has seen an oral maxillofacial surgeon who apparently saw some bone protruding in his jaw that was not covered with tissue and he debrided this.  I am concerned that he still may have a tooth that needs to be removed that is the source of his mandibular osteomyelitis.  He does have some tenderness in his cheek that was not worsened compared to when he was in the hospital.  He has follow-up with his surgeon at the end of this month.     Past Medical History:  Diagnosis Date   Allergy    Anxiety    Arthritis    Back   Chronic back pain    Colon polyps    COPD (chronic obstructive pulmonary disease) (HCC)    Depression    GERD (gastroesophageal reflux disease)    every now and then   Headache    Hyperlipidemia 09/26/2015   Lumbar surgical wound fluid collection    lumbar wound dehiscence   Pneumonia 2020    Past Surgical History:  Procedure Laterality Date   BACK SURGERY     COLONOSCOPY     COLONOSCOPY W/ BIOPSIES AND POLYPECTOMY     EYE SURGERY     IR US GUIDE BX ASP/DRAIN  11/18/2020   KNEE ARTHROSCOPY WITH MEDIAL MENISECTOMY Left 01/23/2016   Procedure: KNEE ARTHROSCOPY WITH MEDIAL MENISECTOMY;  Surgeon: Melrose Nakayama, MD;  Location: [facility];  Service: Orthopedics;  Laterality: Left;   LUMBAR LAMINECTOMY/DECOMPRESSION MICRODISCECTOMY N/A  06/09/2020   Procedure: Decompressive laminectomy Lumbarone -two;  Surgeon: Eustace Moore, MD;  Location: [facility];  Service: Neurosurgery;  Laterality: N/A;   LUMBAR WOUND DEBRIDEMENT N/A 03/21/2014   Procedure: LUMBAR WOUND DEBRIDEMENT;  Surgeon: Eustace Moore, MD;  Location: [facility];  Service: Neurosurgery;  Laterality: N/A;   MAXIMUM ACCESS (MAS)POSTERIOR LUMBAR INTERBODY FUSION (PLIF) 1 LEVEL N/A 02/14/2014   Procedure: FOR MAXIMUM ACCESS SURGERY POSTERIOR LUMBAR INTERBODY FUSION LUMBAR TWO-THREE;  Surgeon: Eustace Moore, MD;  Location: [facility];  Service: Neurosurgery;  Laterality: N/A;   NECK SURGERY     SHOULDER SURGERY     SPINE SURGERY     TONSILLECTOMY      Family History  Problem Relation Age of Onset   Colon cancer Father 39       pt thinks father had colon cancer   Stomach cancer Father    Hypertension Mother    Colon polyps Brother    Aneurysm Maternal Aunt 70       brain   Heart disease Maternal Aunt        d 11   Stomach cancer Paternal Uncle 22   Aneurysm Maternal Grandfather        stomach   Colon cancer Maternal Grandfather  thinks grandfather had colon cancer   Stomach cancer Paternal Grandfather 29   Depression Brother 59   Suicidality Brother    Stomach cancer Paternal Uncle 35   Esophageal cancer Neg Hx    Rectal cancer Neg Hx    Diabetes Neg Hx       Social History   Socioeconomic History   Marital status: Married    Spouse name: cindy   Number of children: Not on file   Years of education: Not on file   Highest education level: Not on file  Occupational History   Occupation: produce manager/ disability    Employer: [employer]  Tobacco Use   Smoking status: Every Day    Packs/day: 0.50    Years: 44.00    Pack years: 22.00    Types: Cigarettes   Smokeless tobacco: Current    Types: Snuff  Vaping Use   Vaping Use: Never used  Substance and Sexual Activity   Alcohol use: Yes    Alcohol/week: 24.0 standard drinks     Types: 24 Standard drinks or equivalent per week    Comment: social beer   Drug use: No   Sexual activity: Yes    Partners: Female  Other Topics Concern   Not on file  Social History Narrative   Exercise--no   Social Determinants of Health   Financial Resource Strain: Low Risk    Difficulty of Paying Living Expenses: Not hard at all  Food Insecurity: No Food Insecurity   Worried About Charity fundraiser in the Last Year: Never true   Arboriculturist in the Last Year: Never true  Transportation Needs: No Transportation Needs   Lack of Transportation (Medical): No   Lack of Transportation (Non-Medical): No  Physical Activity: Inactive   Days of Exercise per Week: 0 days   Minutes of Exercise per Session: 0 min  Stress: No Stress Concern Present   Feeling of Stress : Not at all  Social Connections: Not on file    Allergies  Allergen Reactions   Cefuroxime Axetil Hives     Current Outpatient Medications:    ertapenem (INVANZ) IVPB, Inject 1 g into the vein daily. Indication:  Osteomyelitis of the jaw First Dose: Yes Last Day of Therapy:  12/30/20 Labs - Once weekly:  CBC/D and BMP, Labs - Every other week:  ESR and CRP Method of administration: Mini-Bag Plus / Gravity Method of administration may be changed at the discretion of home infusion pharmacist based upon assessment of the patient and/or caregiver's ability to self-administer the medication ordered., Disp: 41 Units, Rfl: 0   acetaminophen (TYLENOL) 325 MG tablet, Take 650 mg by mouth every 6 (six) hours as needed for mild pain, fever or headache., Disp: , Rfl:    albuterol (VENTOLIN HFA) 108 (90 Base) MCG/ACT inhaler, INHALE 1-2 PUFFS INTO THE LUNGS EVERY 6 (SIX) HOURS AS NEEDED FOR WHEEZING OR SHORTNESS OF BREATH., Disp: 8.5 each, Rfl: 11   atorvastatin (LIPITOR) 10 MG tablet, TAKE 1 TABLET BY MOUTH EVERY DAY, Disp: 90 tablet, Rfl: 0   chlorhexidine (PERIDEX) 0.12 % solution, SMARTSIG:Capful(s) By Mouth, Disp: , Rfl:     DULoxetine (CYMBALTA) 60 MG capsule, Take 60 mg by mouth at bedtime., Disp: , Rfl: 2   ertapenem (INVANZ) 1 g injection, , Disp: , Rfl:    gabapentin (NEURONTIN) 300 MG capsule, Take 1 capsule (300 mg total) by mouth 3 (three) times daily., Disp: 90 capsule, Rfl: 3  meloxicam (MOBIC) 15 MG tablet, Take 1 tablet (15 mg total) by mouth daily., Disp: 90 tablet, Rfl: 3   nicotine (NICODERM CQ - DOSED IN MG/24 HOURS) 14 mg/24hr patch, Place 1 patch (14 mg total) onto the skin daily., Disp: 28 patch, Rfl: 0   Oxycodone HCl 10 MG TABS, Take 1-2 tablets (10-20 mg total) by mouth every 4 (four) hours as needed (pain)., Disp: 40 tablet, Rfl: 0   sodium chloride flush (NS) 0.9 % SOLN, 10-40 mLs by Intracatheter route as needed (flush)., Disp: , Rfl:    thiamine 100 MG tablet, Take 1 tablet (100 mg total) by mouth daily., Disp: 30 tablet, Rfl: 0   tiZANidine (ZANAFLEX) 4 MG tablet, Take 4 mg by mouth 3 (three) times daily. , Disp: , Rfl: 2   Review of Systems  Constitutional:  Negative for activity change, appetite change, chills, diaphoresis, fatigue, fever and unexpected weight change.  HENT:  Positive for facial swelling. Negative for congestion, rhinorrhea, sinus pressure, sneezing, sore throat and trouble swallowing.   Eyes:  Negative for photophobia and visual disturbance.  Respiratory:  Negative for cough, chest tightness, shortness of breath, wheezing and stridor.   Cardiovascular:  Negative for chest pain, palpitations and leg swelling.  Gastrointestinal:  Negative for abdominal distention, abdominal pain, anal bleeding, blood in stool, constipation, diarrhea, nausea and vomiting.  Genitourinary:  Negative for difficulty urinating, dysuria, flank pain and hematuria.  Musculoskeletal:  Negative for arthralgias, back pain, gait problem, joint swelling and myalgias.  Skin:  Negative for color change, pallor, rash and wound.  Neurological:  Negative for dizziness, tremors, weakness and  light-headedness.  Hematological:  Negative for adenopathy. Does not bruise/bleed easily.  Psychiatric/Behavioral:  Negative for agitation, behavioral problems, confusion, decreased concentration, dysphoric mood and sleep disturbance.       Objective:   Physical Exam Constitutional:      Appearance: He is well-developed.  HENT:     Head: Normocephalic and atraumatic.     Comments: Tenderness on the left side of his cheek right over the mandible    Mouth/Throat:     Lips: Pink.     Mouth: Mucous membranes are moist.     Dentition: Abnormal dentition.  Eyes:     Conjunctiva/sclera: Conjunctivae normal.  Cardiovascular:     Rate and Rhythm: Normal rate and regular rhythm.  Pulmonary:     Effort: Pulmonary effort is normal. No respiratory distress.     Breath sounds: No wheezing.  Abdominal:     General: There is no distension.     Palpations: Abdomen is soft.  Musculoskeletal:        General: No tenderness. Normal range of motion.     Cervical back: Normal range of motion and neck supple.  Skin:    General: Skin is warm and dry.     Coloration: Skin is not pale.     Findings: No erythema or rash.  Neurological:     General: No focal deficit present.     Mental Status: He is alert and oriented to person, place, and time.  Psychiatric:        Mood and Affect: Mood normal.        Behavior: Behavior normal.        Thought Content: Thought content normal.        Judgment: Judgment normal.    PICC 12/18/2020:          Assessment & Plan:   Mandibular osteomyelitis due to odontogenic source:  Continue ertapenem and then switch over to Augmentin.  He is going to be seeing his oral maxillofacial surgeon: Would certainly favor removing any tooth that might be a ongoing culprit of infection.  I will plan on giving him protracted oral therapy.  Smoking: would encourage to stop smoking

## 2020-12-18 NOTE — Telephone Encounter (Signed)
End date confirmed as 8/30; gave verbal orders to [facility] with [facility] to pull PICC after last dose. Orders repeated back correctly. Patient notified and instructed to begin oral abx day after completing IV abx. Patient verbalized understanding.   Shayde Gervacio Lorita Officer, RN

## 2020-12-22 DIAGNOSIS — M272 Inflammatory conditions of jaws: Secondary | ICD-10-CM | POA: Diagnosis not present

## 2020-12-23 ENCOUNTER — Telehealth: Payer: Self-pay | Admitting: Internal Medicine

## 2020-12-23 NOTE — Telephone Encounter (Signed)
I dont see where pt is on any medication to cause this  Pt should try to eat frequent small meals to avoid low sugar

## 2020-12-23 NOTE — Telephone Encounter (Signed)
   [name] from [facility] calling to report blood sugar from 8/22 No other details  Blood sugar 36 on 12/22/20  Phone [phone] ext [phone]

## 2020-12-25 DIAGNOSIS — M272 Inflammatory conditions of jaws: Secondary | ICD-10-CM | POA: Diagnosis not present

## 2020-12-25 DIAGNOSIS — R030 Elevated blood-pressure reading, without diagnosis of hypertension: Secondary | ICD-10-CM | POA: Diagnosis not present

## 2020-12-25 DIAGNOSIS — M5416 Radiculopathy, lumbar region: Secondary | ICD-10-CM | POA: Diagnosis not present

## 2020-12-25 DIAGNOSIS — M7061 Trochanteric bursitis, right hip: Secondary | ICD-10-CM | POA: Diagnosis not present

## 2020-12-29 DIAGNOSIS — M272 Inflammatory conditions of jaws: Secondary | ICD-10-CM | POA: Diagnosis not present

## 2020-12-31 ENCOUNTER — Encounter: Payer: Self-pay | Admitting: Pharmacist

## 2020-12-31 ENCOUNTER — Telehealth: Payer: Self-pay | Admitting: Pharmacist

## 2020-12-31 DIAGNOSIS — E162 Hypoglycemia, unspecified: Secondary | ICD-10-CM

## 2020-12-31 DIAGNOSIS — M272 Inflammatory conditions of jaws: Secondary | ICD-10-CM | POA: Diagnosis not present

## 2020-12-31 NOTE — Telephone Encounter (Signed)
Staff to contact pt  Please to ask pt to repeat BMP lab test at the [facility] (no appt needed) hopefully tomorrow sept 1   ( lab opens 0730 to 530 pm)  This is to follow up on recent lab with low sugar and slightly high K

## 2020-12-31 NOTE — Telephone Encounter (Signed)
[name] from [facility] called to pass along critical lab results from 8/29. [name]'s blood glucose was 35 and potassium was 5.7. His PICC was pulled today and additional labs were not checked today. This could possibly be a lab error but without lab follow-up we cannot be sure. Patient does not follow-up at our clinic until the end of September.  Dr. Tommy Medal, please advice. Does patient need repeat labs this week through our clinic or at his PCP's office?   Thank you!  Alfonse Spruce, PharmD, CPP Clinical Pharmacist Practitioner Infectious [facility]

## 2020-12-31 NOTE — Telephone Encounter (Signed)
Reached out to the patient and his PCP to set up a lab appointment. Thanks!

## 2020-12-31 NOTE — Telephone Encounter (Signed)
-----   Message from Esmond Plants, RPH-CPP sent at 12/31/2020  2:31 PM EDT ----- Regarding: Lab Results - Needs new Lab Good afternoon Dr. [name],  I am a pharmacist with Dr. Tommy Medal at the [facility] for Infectious Disease. Your mutual patient seen above completed IV antibiotics today, and his PICC line was pulled. We were informed today after his PICC was pulled that his blood glucose was 35 and potassium was 5.7 on 8/29.  Dr. Tommy Medal is requesting follow-up labs through you all. I have messaged the patient and requested he make a lab follow-up appointment with you all. Can you all please schedule this lab follow-up with him to ensure his labs are in range or were perhaps a lab error?  Thank you!  Alfonse Spruce, PharmD, CPP Clinical Pharmacist Practitioner Infectious Diseases [facility] [phone]

## 2021-01-06 DIAGNOSIS — R030 Elevated blood-pressure reading, without diagnosis of hypertension: Secondary | ICD-10-CM | POA: Diagnosis not present

## 2021-01-06 DIAGNOSIS — M5126 Other intervertebral disc displacement, lumbar region: Secondary | ICD-10-CM | POA: Diagnosis not present

## 2021-01-06 DIAGNOSIS — M5416 Radiculopathy, lumbar region: Secondary | ICD-10-CM | POA: Diagnosis not present

## 2021-01-06 DIAGNOSIS — M7061 Trochanteric bursitis, right hip: Secondary | ICD-10-CM | POA: Diagnosis not present

## 2021-01-06 NOTE — Telephone Encounter (Signed)
Left a detailed message on patient voicemail to have lab done. Advised patient to contact our office with any further questions.

## 2021-01-07 ENCOUNTER — Other Ambulatory Visit: Payer: Self-pay

## 2021-01-07 ENCOUNTER — Encounter: Payer: Medicare Other | Admitting: Neurology

## 2021-01-07 DIAGNOSIS — R202 Paresthesia of skin: Secondary | ICD-10-CM

## 2021-01-07 NOTE — Progress Notes (Signed)
mg 

## 2021-01-13 ENCOUNTER — Other Ambulatory Visit: Payer: Self-pay

## 2021-01-13 ENCOUNTER — Ambulatory Visit: Payer: Medicare Other | Admitting: Neurology

## 2021-01-13 DIAGNOSIS — R202 Paresthesia of skin: Secondary | ICD-10-CM | POA: Diagnosis not present

## 2021-01-13 NOTE — Procedures (Signed)
[facility]  [address], [address]  [place], [place] [address] Tel: [phone] Fax:  [phone] Test Date:  01/13/2021  Patient: [name] DOB: 1960-07-05 Physician: Narda Amber, DO  Sex: Male Height: '5\' 11"'$  Ref Phys: Hulan Saas, DO  ID#: [id number]   Technician:    Patient Complaints: This is a 60 year old man referred for evaluation of bilateral feet pain and paresthesias.  NCV & EMG Findings: Electrodiagnostic testing of the right lower extremity and additional studies of the left shows: Bilateral sural and superficial peroneal sensory responses are within normal limits. Bilateral peroneal and tibial motor responses are within normal limits. Bilateral tibial H reflex studies are within normal limits. There is no evidence of active or chronic motor axonal changes affecting any of the tested muscles.  Motor unit configuration and recruitment pattern is within normal limits.  Impression: This is a normal study of the lower extremities.  In particular, there is no evidence of a sensorimotor polyneuropathy or lumbosacral radiculopathy.   ___________________________ Narda Amber, DO    Nerve Conduction Studies Anti Sensory Summary Table   Stim Site NR Peak (ms) Norm Peak (ms) P-T Amp (V) Norm P-T Amp  Left Sup Peroneal Anti Sensory (Ant Lat Mall)  32C  12 cm    3.9 <4.6 10.2 >4  Right Sup Peroneal Anti Sensory (Ant Lat Mall)  32C  12 cm    3.0 <4.6 9.9 >4  Left Sural Anti Sensory (Lat Mall)  32C  Calf    3.4 <4.6 11.3 >4  Right Sural Anti Sensory (Lat Mall)  32C  Calf    4.4 <4.6 10.2 >4   Motor Summary Table   Stim Site NR Onset (ms) Norm Onset (ms) O-P Amp (mV) Norm O-P Amp Site1 Site2 Delta-0 (ms) Dist (cm) Vel (m/s) Norm Vel (m/s)  Left Peroneal Motor (Ext Dig Brev)  32C  Ankle    4.9 <6.0 2.5 >2.5 B Fib Ankle 9.6 0.0  >40  B Fib    14.5  2.0  Poplt B Fib 0.7 0.0  >40  Poplt    15.2  1.9         Right Peroneal Motor (Ext Dig Brev)   32C  Ankle    5.2 <6.0 3.4 >2.5 B Fib Ankle 8.4 35.0 42 >40  B Fib    13.6  3.3  Poplt B Fib 1.8 8.0 44 >40  Poplt    15.4  3.3         Left Tibial Motor (Abd Hall Brev)  32C  Ankle    4.7 <6.0 6.0 >4 Knee Ankle 9.4 43.0 46 >40  Knee    14.1  4.4         Right Tibial Motor (Abd Hall Brev)  32C  Ankle    4.6 <6.0 5.8 >4 Knee Ankle 10.0 44.0 44 >40  Knee    14.6  4.1          H Reflex Studies   NR H-Lat (ms) Lat Norm (ms) L-R H-Lat (ms)  Left Tibial (Gastroc)  32C     33.33 <35 1.77  Right Tibial (Gastroc)  32C     34.68 <35 1.77   EMG   Side Muscle Ins Act Fibs Psw Fasc Number Recrt Dur Dur. Amp Amp. Poly Poly. Comment  Right AntTibialis Nml Nml Nml Nml Nml Nml Nml Nml Nml Nml Nml Nml N/A  Right Gastroc Nml Nml Nml Nml Nml Nml Nml Nml Nml Nml Nml  Nml N/A  Right Flex Dig Long Nml Nml Nml Nml Nml Nml Nml Nml Nml Nml Nml Nml N/A  Right RectFemoris Nml Nml Nml Nml Nml Nml Nml Nml Nml Nml Nml Nml N/A  Right GluteusMed Nml Nml Nml Nml Nml Nml Nml Nml Nml Nml Nml Nml N/A  Right BicepsFemS Nml Nml Nml Nml Nml Nml Nml Nml Nml Nml Nml Nml N/A  Left AntTibialis Nml Nml Nml Nml Nml Nml Nml Nml Nml Nml Nml Nml N/A  Left Gastroc Nml Nml Nml Nml Nml Nml Nml Nml Nml Nml Nml Nml N/A  Left Flex Dig Long Nml Nml Nml Nml Nml Nml Nml Nml Nml Nml Nml Nml N/A  Left RectFemoris Nml Nml Nml Nml Nml Nml Nml Nml Nml Nml Nml Nml N/A  Left GluteusMed Nml Nml Nml Nml Nml Nml Nml Nml Nml Nml Nml Nml N/A      Waveforms:

## 2021-01-14 NOTE — Progress Notes (Deleted)
[facility] Phone: [phone] Subjective:    I'm seeing this patient by the request  of:  [name], MD  CC:   [id number]  12/05/2020 Difficult to tell why patient continues to have significant foot pain.  Patient's ABIs have been fairly unremarkable except for very mild superficial blood clot.  Do not think that this would be contributing.  At this point we will get patient to have nerve conduction studies to further evaluate to see if this is more secondary to his chronic back discomfort or potentially even peripheral neuropathy with patient's other medical comorbidities.  Patient will continue the good shoes, and continue to try the custom orthotics.  Follow-up with me again 6 to 8 weeks  Update 01/20/2021 HOSSEIN ETRIS is a 60 y.o. male coming in with complaint of B foot pain. Patient had EMG on 01/13/2021.   Onset-  Location Duration-  Character- Aggravating factors- Reliving factors-  Therapies tried-  Severity-     Past Medical History:  Diagnosis Date   Allergy    Anxiety    Arthritis    Back   Chronic back pain    Colon polyps    COPD (chronic obstructive pulmonary disease) (HCC)    Depression    GERD (gastroesophageal reflux disease)    every now and then   Headache    Hyperlipidemia 09/26/2015   Lumbar surgical wound fluid collection    lumbar wound dehiscence   Pneumonia 2020   Past Surgical History:  Procedure Laterality Date   BACK SURGERY     COLONOSCOPY     COLONOSCOPY W/ BIOPSIES AND POLYPECTOMY     EYE SURGERY     IR US GUIDE BX ASP/DRAIN  11/18/2020   KNEE ARTHROSCOPY WITH MEDIAL MENISECTOMY Left 01/23/2016   Procedure: KNEE ARTHROSCOPY WITH MEDIAL MENISECTOMY;  Surgeon: Melrose Nakayama, MD;  Location: [facility];  Service: Orthopedics;  Laterality: Left;   LUMBAR LAMINECTOMY/DECOMPRESSION MICRODISCECTOMY N/A 06/09/2020   Procedure: Decompressive  laminectomy Lumbarone -two;  Surgeon: Eustace Moore, MD;  Location: [facility];  Service: Neurosurgery;  Laterality: N/A;   LUMBAR WOUND DEBRIDEMENT N/A 03/21/2014   Procedure: LUMBAR WOUND DEBRIDEMENT;  Surgeon: Eustace Moore, MD;  Location: [facility];  Service: Neurosurgery;  Laterality: N/A;   MAXIMUM ACCESS (MAS)POSTERIOR LUMBAR INTERBODY FUSION (PLIF) 1 LEVEL N/A 02/14/2014   Procedure: FOR MAXIMUM ACCESS SURGERY POSTERIOR LUMBAR INTERBODY FUSION LUMBAR TWO-THREE;  Surgeon: Eustace Moore, MD;  Location: [facility];  Service: Neurosurgery;  Laterality: N/A;   NECK SURGERY     SHOULDER SURGERY     SPINE SURGERY     TONSILLECTOMY     Social History   Socioeconomic History   Marital status: Married    Spouse name: cindy   Number of children: Not on file   Years of education: Not on file   Highest education level: Not on file  Occupational History   Occupation: produce manager/ disability    Employer: [employer]  Tobacco Use   Smoking status: Every Day    Packs/day: 0.50    Years: 44.00    Pack years: 22.00    Types: Cigarettes   Smokeless tobacco: Current    Types: Snuff  Vaping Use   Vaping Use: Never used  Substance and Sexual Activity   Alcohol use: Yes    Alcohol/week: 24.0 standard drinks    Types: 24 Standard drinks  or equivalent per week    Comment: social beer   Drug use: No   Sexual activity: Yes    Partners: Female  Other Topics Concern   Not on file  Social History Narrative   Exercise--no   Social Determinants of Health   Financial Resource Strain: Low Risk    Difficulty of Paying Living Expenses: Not hard at all  Food Insecurity: No Food Insecurity   Worried About Charity fundraiser in the Last Year: Never true   Arboriculturist in the Last Year: Never true  Transportation Needs: No Transportation Needs   Lack of Transportation (Medical): No   Lack of Transportation (Non-Medical): No  Physical Activity: Inactive   Days of Exercise per Week: 0  days   Minutes of Exercise per Session: 0 min  Stress: No Stress Concern Present   Feeling of Stress : Not at all  Social Connections: Not on file   Allergies  Allergen Reactions   Cefuroxime Axetil Hives   Family History  Problem Relation Age of Onset   Colon cancer Father 41       pt thinks father had colon cancer   Stomach cancer Father    Hypertension Mother    Colon polyps Brother    Aneurysm Maternal Aunt 53       brain   Heart disease Maternal Aunt        d 80   Stomach cancer Paternal Uncle 75   Aneurysm Maternal Grandfather        stomach   Colon cancer Maternal Grandfather        thinks grandfather had colon cancer   Stomach cancer Paternal Grandfather 70   Depression Brother 81   Suicidality Brother    Stomach cancer Paternal Uncle 66   Esophageal cancer Neg Hx    Rectal cancer Neg Hx    Diabetes Neg Hx      Current Outpatient Medications (Cardiovascular):    atorvastatin (LIPITOR) 10 MG tablet, TAKE 1 TABLET BY MOUTH EVERY DAY  Current Outpatient Medications (Respiratory):    albuterol (VENTOLIN HFA) 108 (90 Base) MCG/ACT inhaler, INHALE 1-2 PUFFS INTO THE LUNGS EVERY 6 (SIX) HOURS AS NEEDED FOR WHEEZING OR SHORTNESS OF BREATH.  Current Outpatient Medications (Analgesics):    acetaminophen (TYLENOL) 325 MG tablet, Take 650 mg by mouth every 6 (six) hours as needed for mild pain, fever or headache.   meloxicam (MOBIC) 15 MG tablet, Take 1 tablet (15 mg total) by mouth daily.   Oxycodone HCl 10 MG TABS, Take 1-2 tablets (10-20 mg total) by mouth every 4 (four) hours as needed (pain).   Current Outpatient Medications (Other):    amoxicillin-clavulanate (AUGMENTIN) 875-125 MG tablet, Take 1 tablet by mouth 2 (two) times daily.   chlorhexidine (PERIDEX) 0.12 % solution, SMARTSIG:Capful(s) By Mouth   DULoxetine (CYMBALTA) 60 MG capsule, Take 60 mg by mouth at bedtime.   ertapenem (INVANZ) 1 g injection,    gabapentin (NEURONTIN) 300 MG capsule, Take 1  capsule (300 mg total) by mouth 3 (three) times daily.   nicotine (NICODERM CQ - DOSED IN MG/24 HOURS) 14 mg/24hr patch, Place 1 patch (14 mg total) onto the skin daily.   sodium chloride flush (NS) 0.9 % SOLN, 10-40 mLs by Intracatheter route as needed (flush).   thiamine 100 MG tablet, Take 1 tablet (100 mg total) by mouth daily.   tiZANidine (ZANAFLEX) 4 MG tablet, Take 4 mg by mouth 3 (three) times daily.  Reviewed prior external information including notes and imaging from  primary care provider As well as notes that were available from care everywhere and other healthcare systems.  Past medical history, social, surgical and family history all reviewed in electronic medical record.  No pertanent information unless stated regarding to the chief complaint.   Review of Systems:  No headache, visual changes, nausea, vomiting, diarrhea, constipation, dizziness, abdominal pain, skin rash, fevers, chills, night sweats, weight loss, swollen lymph nodes, body aches, joint swelling, chest pain, shortness of breath, mood changes. POSITIVE muscle aches  Objective  There were no vitals taken for this visit.   General: No apparent distress alert and oriented x3 mood and affect normal, dressed appropriately.  HEENT: Pupils equal, extraocular movements intact  Respiratory: Patient's speak in full sentences and does not appear short of breath  Cardiovascular: No lower extremity edema, non tender, no erythema  Gait normal with good balance and coordination.  MSK:  Non tender with full range of motion and good stability and symmetric strength and tone of shoulders, elbows, wrist, hip, knee and ankles bilaterally.     Impression and Recommendations:     The above documentation has been reviewed and is accurate and complete Jacqualin Combes

## 2021-01-19 ENCOUNTER — Other Ambulatory Visit (INDEPENDENT_AMBULATORY_CARE_PROVIDER_SITE_OTHER): Payer: Medicare Other

## 2021-01-19 ENCOUNTER — Encounter: Payer: Self-pay | Admitting: Internal Medicine

## 2021-01-19 DIAGNOSIS — E162 Hypoglycemia, unspecified: Secondary | ICD-10-CM

## 2021-01-19 LAB — BASIC METABOLIC PANEL
BUN: 13 mg/dL (ref 6–23)
CO2: 29 mEq/L (ref 19–32)
Calcium: 9.3 mg/dL (ref 8.4–10.5)
Chloride: 104 mEq/L (ref 96–112)
Creatinine, Ser: 0.76 mg/dL (ref 0.40–1.50)
GFR: 98.63 mL/min (ref >60.00)
Glucose, Bld: 99 mg/dL (ref 70–99)
Potassium: 5 mEq/L (ref 3.5–5.1)
Sodium: 140 mEq/L (ref 135–145)

## 2021-01-19 NOTE — Addendum Note (Signed)
Addended by: Azzie Almas on: 01/19/2021 10:09 AM   Modules accepted: Orders

## 2021-01-20 ENCOUNTER — Ambulatory Visit: Payer: Medicare Other | Admitting: Family Medicine

## 2021-01-26 ENCOUNTER — Other Ambulatory Visit: Payer: Self-pay

## 2021-01-26 ENCOUNTER — Encounter: Payer: Self-pay | Admitting: Infectious Disease

## 2021-01-26 ENCOUNTER — Ambulatory Visit: Payer: Medicare Other | Admitting: Infectious Disease

## 2021-01-26 VITALS — BP 160/92 | HR 88 | Temp 98.3°F | Wt 256.0 lb

## 2021-01-26 DIAGNOSIS — Z23 Encounter for immunization: Secondary | ICD-10-CM | POA: Diagnosis not present

## 2021-01-26 DIAGNOSIS — F17200 Nicotine dependence, unspecified, uncomplicated: Secondary | ICD-10-CM

## 2021-01-26 DIAGNOSIS — M272 Inflammatory conditions of jaws: Secondary | ICD-10-CM | POA: Diagnosis not present

## 2021-01-26 NOTE — Progress Notes (Signed)
   Covid-19 Vaccination Clinic  Name:  [name]    MRN: [id number] DOB: [birth year]  01/26/2021  Mr. [name] was observed post Covid-19 immunization for 15 minutes without incident. He was provided with Vaccine Information Sheet and instruction to access the V-Safe system.   Mr. [name] was instructed to call 911 with any severe reactions post vaccine: Difficulty breathing  Swelling of face and throat  A fast heartbeat  A bad rash all over body  Dizziness and weakness    Leatrice Jewels, RMA

## 2021-01-26 NOTE — Progress Notes (Signed)
Subjective:  Chief complaint: Still having some discomfort in his teeth and also a sensation of ears being full when he lifts [name]   Patient ID: [name], male    DOB: 08/11/1960, 60 y.o.   MRN: [id number]  HPI  60 year old with hx of history of lumbar surgery COPD smoking who had a root canal that then became infected odontogenic infection which spread with osteomyelitis of the jaw and an abscess in the muscles of mastication status post IR guided drainage of abscess with culture sent which yielded Streptococcus Intermediums which was PCN Sensitive.   We saw him in the hospital and set him up with IV ertapenem which he is continued on.  He has seen an oral maxillofacial surgeon who apparently saw some bone protruding in his jaw that was not covered with tissue and he debrided this.  I was  concerned that he still may have a tooth that needs to be removed that is the source of his mandibular osteomyelitis.  He had had a root canal performed before the osteomyelitis set in the OR on CT maxillofacial there was no abnormalities immediately around the root canal.  His dentist had called me and was not wanting to remove the tooth where the root canal was performed because of there being no overt infection there and also no evidence on CT scan of that site actually being infected is quite curious in the sense that 1 would expect that a tooth would be involved near an area of mandibular osteomyelitis.  Patient was going to have the tooth removed today actually but I then again reviewed my discussion with his dentist.    He is still bothered though by how his teeth feel at times when he closes his teeth both back where the root canal is and also when he closes the teeth in the front of his mouth he has a sensation towards the back.  He also is a sensation of fullness in his ear when he tilts [name].  This was present before but is improving    Past Medical  History:  Diagnosis Date  . Allergy   . Anxiety   . Arthritis    Back  . Chronic back pain   . Colon polyps   . COPD (chronic obstructive pulmonary disease) ([place])   . Depression   . GERD (gastroesophageal reflux disease)    every now and then  . Headache   . Hyperlipidemia 09/26/2015  . Lumbar surgical wound fluid collection    lumbar wound dehiscence  . Pneumonia 2020    Past Surgical History:  Procedure Laterality Date  . BACK SURGERY    . COLONOSCOPY    . COLONOSCOPY W/ BIOPSIES AND POLYPECTOMY    . EYE SURGERY    . IR US GUIDE BX ASP/DRAIN  11/18/2020  . KNEE ARTHROSCOPY WITH MEDIAL MENISECTOMY Left 01/23/2016   Procedure: KNEE ARTHROSCOPY WITH MEDIAL MENISECTOMY;  Surgeon: Melrose Nakayama, MD;  Location: [facility];  Service: Orthopedics;  Laterality: Left;  . LUMBAR LAMINECTOMY/DECOMPRESSION MICRODISCECTOMY N/A 06/09/2020   Procedure: Decompressive laminectomy Lumbarone -two;  Surgeon: Eustace Moore, MD;  Location: [facility];  Service: Neurosurgery;  Laterality: N/A;  . LUMBAR WOUND DEBRIDEMENT N/A 03/21/2014   Procedure: LUMBAR WOUND DEBRIDEMENT;  Surgeon: Eustace Moore, MD;  Location: [facility];  Service: Neurosurgery;  Laterality: N/A;  . MAXIMUM ACCESS (MAS)POSTERIOR LUMBAR INTERBODY FUSION (PLIF) 1 LEVEL N/A 02/14/2014  Procedure: FOR MAXIMUM ACCESS SURGERY POSTERIOR LUMBAR INTERBODY FUSION LUMBAR TWO-THREE;  Surgeon: Eustace Moore, MD;  Location: [facility];  Service: Neurosurgery;  Laterality: N/A;  . NECK SURGERY    . SHOULDER SURGERY    . SPINE SURGERY    . TONSILLECTOMY      Family History  Problem Relation Age of Onset  . Colon cancer Father 75       pt thinks father had colon cancer  . Stomach cancer Father   . Hypertension Mother   . Colon polyps Brother   . Aneurysm Maternal Aunt 58       brain  . Heart disease Maternal Aunt        d 18  . Stomach cancer Paternal Uncle 25  . Aneurysm Maternal Grandfather        stomach  . Colon  cancer Maternal Grandfather        thinks grandfather had colon cancer  . Stomach cancer Paternal Grandfather 46  . Depression Brother 38  . Suicidality Brother   . Stomach cancer Paternal Uncle 54  . Esophageal cancer Neg Hx   . Rectal cancer Neg Hx   . Diabetes Neg Hx       Social History   Socioeconomic History  . Marital status: Married    Spouse name: cindy  . Number of children: Not on file  . Years of education: Not on file  . Highest education level: Not on file  Occupational History  . Occupation: produce manager/ disability    Employer: [employer]  Tobacco Use  . Smoking status: Every Day    Packs/day: 0.50    Years: 44.00    Pack years: 22.00    Types: Cigarettes  . Smokeless tobacco: Current    Types: Snuff  Vaping Use  . Vaping Use: Never used  Substance and Sexual Activity  . Alcohol use: Yes    Alcohol/week: 24.0 standard drinks    Types: 24 Standard drinks or equivalent per week    Comment: social beer  . Drug use: No  . Sexual activity: Yes    Partners: Female  Other Topics Concern  . Not on file  Social History Narrative   Exercise--no   Social Determinants of Health   Financial Resource Strain: Low Risk   . Difficulty of Paying Living Expenses: Not hard at all  Food Insecurity: No Food Insecurity  . Worried About Charity fundraiser in the Last Year: Never true  . Ran Out of Food in the Last Year: Never true  Transportation Needs: No Transportation Needs  . Lack of Transportation (Medical): No  . Lack of Transportation (Non-Medical): No  Physical Activity: Inactive  . Days of Exercise per Week: 0 days  . Minutes of Exercise per Session: 0 min  Stress: No Stress Concern Present  . Feeling of Stress : Not at all  Social Connections: Not on file    Allergies  Allergen Reactions  . Cefuroxime Axetil Hives     Current Outpatient Medications:  .  acetaminophen (TYLENOL) 325 MG tablet, Take 650 mg by mouth every 6 (six) hours as  needed for mild pain, fever or headache., Disp: , Rfl:  .  albuterol (VENTOLIN HFA) 108 (90 Base) MCG/ACT inhaler, INHALE 1-2 PUFFS INTO THE LUNGS EVERY 6 (SIX) HOURS AS NEEDED FOR WHEEZING OR SHORTNESS OF BREATH., Disp: 8.5 each, Rfl: 11 .  amoxicillin-clavulanate (AUGMENTIN) 875-125 MG tablet, Take 1 tablet by mouth 2 (two)  times daily., Disp: 60 tablet, Rfl: 5 .  atorvastatin (LIPITOR) 10 MG tablet, TAKE 1 TABLET BY MOUTH EVERY DAY, Disp: 90 tablet, Rfl: 0 .  chlorhexidine (PERIDEX) 0.12 % solution, SMARTSIG:Capful(s) By Mouth, Disp: , Rfl:  .  DULoxetine (CYMBALTA) 60 MG capsule, Take 60 mg by mouth at bedtime., Disp: , Rfl: 2 .  ertapenem (INVANZ) 1 g injection, , Disp: , Rfl:  .  gabapentin (NEURONTIN) 300 MG capsule, Take 1 capsule (300 mg total) by mouth 3 (three) times daily., Disp: 90 capsule, Rfl: 3 .  meloxicam (MOBIC) 15 MG tablet, Take 1 tablet (15 mg total) by mouth daily., Disp: 90 tablet, Rfl: 3 .  nicotine (NICODERM CQ - DOSED IN MG/24 HOURS) 14 mg/24hr patch, Place 1 patch (14 mg total) onto the skin daily., Disp: 28 patch, Rfl: 0 .  Oxycodone HCl 10 MG TABS, Take 1-2 tablets (10-20 mg total) by mouth every 4 (four) hours as needed (pain)., Disp: 40 tablet, Rfl: 0 .  sodium chloride flush (NS) 0.9 % SOLN, 10-40 mLs by Intracatheter route as needed (flush)., Disp: , Rfl:  .  thiamine 100 MG tablet, Take 1 tablet (100 mg total) by mouth daily., Disp: 30 tablet, Rfl: 0 .  tiZANidine (ZANAFLEX) 4 MG tablet, Take 4 mg by mouth 3 (three) times daily. , Disp: , Rfl: 2   Review of Systems  Constitutional:  Negative for activity change, appetite change, chills, diaphoresis, fatigue, fever and unexpected weight change.  HENT:  Positive for dental problem. Negative for congestion, rhinorrhea, sinus pressure, sneezing, sore throat and trouble swallowing.   Eyes:  Negative for photophobia and visual disturbance.  Respiratory:  Negative for cough, chest tightness, shortness of breath,  wheezing and stridor.   Cardiovascular:  Negative for chest pain, palpitations and leg swelling.  Gastrointestinal:  Negative for abdominal distention, abdominal pain, anal bleeding, blood in stool, constipation, diarrhea, nausea and vomiting.  Genitourinary:  Negative for difficulty urinating, dysuria, flank pain and hematuria.  Musculoskeletal:  Negative for arthralgias, back pain, gait problem, joint swelling and myalgias.  Skin:  Negative for color change, pallor, rash and wound.  Neurological:  Negative for dizziness, tremors, weakness and light-headedness.  Hematological:  Negative for adenopathy. Does not bruise/bleed easily.  Psychiatric/Behavioral:  Negative for agitation, behavioral problems, confusion, decreased concentration, dysphoric mood and sleep disturbance.       Objective:   Physical Exam Constitutional:      Appearance: He is well-developed.  HENT:     [name]: Normocephalic and atraumatic.     Mouth/Throat:     Mouth: Mucous membranes are moist.     Dentition: Normal dentition. Does not have dentures.     Tongue: No lesions. Tongue does not deviate from midline.     Pharynx: Uvula midline. No pharyngeal swelling or oropharyngeal exudate.  Eyes:     Conjunctiva/sclera: Conjunctivae normal.  Cardiovascular:     Rate and Rhythm: Normal rate and regular rhythm.  Pulmonary:     Effort: Pulmonary effort is normal. No respiratory distress.     Breath sounds: No wheezing.  Abdominal:     General: There is no distension.     Palpations: Abdomen is soft.  Musculoskeletal:        General: No tenderness. Normal range of motion.     Cervical back: Normal range of motion and neck supple.  Skin:    General: Skin is warm and dry.     Coloration: Skin is not pale.  Findings: No erythema or rash.  Neurological:     General: No focal deficit present.     Mental Status: He is alert and oriented to person, place, and time.  Psychiatric:        Mood and Affect: Mood normal.         Behavior: Behavior normal.        Thought Content: Thought content normal.        Judgment: Judgment normal.            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  It is curious that there is no overt evidence of infection around the root canal which was performed prior to him developing right mandibular osteomyelitis.  His dentist does not believe the site is infected based on the exam and based on the CT scan.  We will continue him on Augmentin I am continue this prescription.  We will recheck a CBC with a basic metabolic panel sed rate and CRP.  I will plan on getting CT maxillofacial after I see him in November  HTN: BP up likely due in part to white coat hypertension. Can followup with PCP  Checking BMP  Vitals:   01/26/21 1006  BP: (!) 160/92  Pulse: 88  Temp: 98.3 F (36.8 C)    Smoking: encouraged him to quit  Vaccine counseling: counseled to receive vaccine for COVID and influenza and he was agreeable to both.

## 2021-01-27 LAB — CBC WITH DIFFERENTIAL/PLATELET
Absolute Monocytes: 789 cells/uL (ref 200–950)
Basophils Absolute: 105 cells/uL (ref 0–200)
Basophils Relative: 1.1 %
Eosinophils Absolute: 124 cells/uL (ref 15–500)
Eosinophils Relative: 1.3 %
HCT: 45.4 % (ref 38.5–50.0)
Hemoglobin: 15.1 g/dL (ref 13.2–17.1)
Lymphs Abs: 2128 cells/uL (ref 850–3900)
MCH: 31.3 pg (ref 27.0–33.0)
MCHC: 33.3 g/dL (ref 32.0–36.0)
MCV: 94.2 fL (ref 80.0–100.0)
MPV: 9.4 fL (ref 7.5–12.5)
Monocytes Relative: 8.3 %
Neutro Abs: 6356 cells/uL (ref 1500–7800)
Neutrophils Relative %: 66.9 %
Platelets: 310 10*3/uL (ref 140–400)
RBC: 4.82 10*6/uL (ref 4.20–5.80)
RDW: 13 % (ref 11.0–15.0)
Total Lymphocyte: 22.4 %
WBC: 9.5 10*3/uL (ref 3.8–10.8)

## 2021-01-27 LAB — BASIC METABOLIC PANEL WITH GFR
BUN: 13 mg/dL (ref 7–25)
CO2: 29 mmol/L (ref 20–32)
Calcium: 9.7 mg/dL (ref 8.6–10.3)
Chloride: 105 mmol/L (ref 98–110)
Creat: 0.8 mg/dL (ref 0.70–1.30)
Glucose, Bld: 94 mg/dL (ref 65–99)
Potassium: 4.4 mmol/L (ref 3.5–5.3)
Sodium: 140 mmol/L (ref 135–146)
eGFR: 102 mL/min/{1.73_m2} (ref >=60)

## 2021-01-27 LAB — SEDIMENTATION RATE: Sed Rate: 6 mm/h (ref 0–20)

## 2021-01-27 LAB — C-REACTIVE PROTEIN: CRP: 3.2 mg/L (ref <8.0)

## 2021-02-09 NOTE — Progress Notes (Signed)
[name] [address] [place] Phone: [phone] Subjective:   [name], am serving as a scribe for Dr. Hulan [name]. This visit occurred during the SARS-CoV-2 public health emergency.  Safety protocols were in place, including screening questions prior to the visit, additional usage of staff PPE, and extensive cleaning of exam room while observing appropriate contact time as indicated for disinfecting solutions.   I'm seeing this patient by the request  of:  Richard Borg, MD  CC: Bilateral foot pain and numbness follow-up  NID:[id number]  12/05/2020 Difficult to tell why patient continues to have significant foot pain.  Patient's ABIs have been fairly unremarkable except for very mild superficial blood clot.  Do not think that this would be contributing.  At this point we will get patient to have nerve conduction studies to further evaluate to see if this is more secondary to his chronic back discomfort or potentially even peripheral neuropathy with patient's other medical comorbidities.  Patient will continue the good shoes, and continue to try the custom orthotics.  Follow-up with me again 6 to 8 weeks  Updated 02/10/2021 [name] is a 60 y.o. male coming in with complaint of bilateral foot pain. Nothing has gotten better, but nothing has gotten worse. Numbness still persist.  We have done significant work-up including nerve conduction studies as well as ABIs and lab tests.  Patient states that nothing has seemed to be beneficial.  Nerve conduction did not show any specific type of peripheral neuropathy.  Patient does have x-rays and further back intervention where patient has had surgeries.       Past Medical History:  Diagnosis Date   Allergy    Anxiety    Arthritis    Back   Chronic back pain    Colon polyps    COPD (chronic obstructive pulmonary disease) (HCC)    Depression    GERD (gastroesophageal reflux  disease)    every now and then   Headache    Hyperlipidemia 09/26/2015   Lumbar surgical wound fluid collection    lumbar wound dehiscence   Pneumonia 2020   Past Surgical History:  Procedure Laterality Date   BACK SURGERY     COLONOSCOPY     COLONOSCOPY W/ BIOPSIES AND POLYPECTOMY     EYE SURGERY     IR US GUIDE BX ASP/DRAIN  11/18/2020   KNEE ARTHROSCOPY WITH MEDIAL MENISECTOMY Left 01/23/2016   Procedure: KNEE ARTHROSCOPY WITH MEDIAL MENISECTOMY;  Surgeon: Richard Nakayama, MD;  Location: [facility];  Service: Orthopedics;  Laterality: Left;   LUMBAR LAMINECTOMY/DECOMPRESSION MICRODISCECTOMY N/A 06/09/2020   Procedure: Decompressive laminectomy Lumbarone -two;  Surgeon: Richard Moore, MD;  Location: [facility];  Service: Neurosurgery;  Laterality: N/A;   LUMBAR WOUND DEBRIDEMENT N/A 03/21/2014   Procedure: LUMBAR WOUND DEBRIDEMENT;  Surgeon: Richard Moore, MD;  Location: [facility];  Service: Neurosurgery;  Laterality: N/A;   MAXIMUM ACCESS (MAS)POSTERIOR LUMBAR INTERBODY FUSION (PLIF) 1 LEVEL N/A 02/14/2014   Procedure: FOR MAXIMUM ACCESS SURGERY POSTERIOR LUMBAR INTERBODY FUSION LUMBAR TWO-THREE;  Surgeon: Richard Moore, MD;  Location: [facility];  Service: Neurosurgery;  Laterality: N/A;   NECK SURGERY     SHOULDER SURGERY     SPINE SURGERY     TONSILLECTOMY     Social History   Socioeconomic History   Marital status: Married    Spouse name: [name]   Number of children: Not on  file   Years of education: Not on file   Highest education level: Not on file  Occupational History   Occupation: produce manager/ disability    Employer: [employer]  Tobacco Use   Smoking status: Every Day    Packs/day: 0.50    Years: 44.00    Pack years: 22.00    Types: Cigarettes   Smokeless tobacco: Current    Types: Snuff  Vaping Use   Vaping Use: Never used  Substance and Sexual Activity   Alcohol use: Yes    Alcohol/week: 24.0 standard drinks    Types: 24 Standard  drinks or equivalent per week    Comment: social beer   Drug use: No   Sexual activity: Yes    Partners: Female  Other Topics Concern   Not on file  Social History Narrative   Exercise--no   Social Determinants of Health   Financial Resource Strain: Low Risk    Difficulty of Paying Living Expenses: Not hard at all  Food Insecurity: No Food Insecurity   Worried About Charity fundraiser in the Last Year: Never true   Arboriculturist in the Last Year: Never true  Transportation Needs: No Transportation Needs   Lack of Transportation (Medical): No   Lack of Transportation (Non-Medical): No  Physical Activity: Inactive   Days of Exercise per Week: 0 days   Minutes of Exercise per Session: 0 min  Stress: No Stress Concern Present   Feeling of Stress : Not at all  Social Connections: Not on file   Allergies  Allergen Reactions   Cefuroxime Axetil Hives   Family History  Problem Relation Age of Onset   Colon cancer Father 37       pt thinks father had colon cancer   Stomach cancer Father    Hypertension Mother    Colon polyps Brother    Aneurysm Maternal Aunt 81       brain   Heart disease Maternal Aunt        d 66   Stomach cancer Paternal Uncle 50   Aneurysm Maternal Grandfather        stomach   Colon cancer Maternal Grandfather        thinks grandfather had colon cancer   Stomach cancer Paternal Grandfather 30   Depression Brother 61   Suicidality Brother    Stomach cancer Paternal Uncle 8   Esophageal cancer Neg Hx    Rectal cancer Neg Hx    Diabetes Neg Hx      Current Outpatient Medications (Cardiovascular):    atorvastatin (LIPITOR) 10 MG tablet, TAKE 1 TABLET BY MOUTH EVERY DAY  Current Outpatient Medications (Respiratory):    albuterol (VENTOLIN HFA) 108 (90 Base) MCG/ACT inhaler, INHALE 1-2 PUFFS INTO THE LUNGS EVERY 6 (SIX) HOURS AS NEEDED FOR WHEEZING OR SHORTNESS OF BREATH.  Current Outpatient Medications (Analgesics):    acetaminophen (TYLENOL)  325 MG tablet, Take 650 mg by mouth every 6 (six) hours as needed for mild pain, fever or headache.   meloxicam (MOBIC) 15 MG tablet, Take 1 tablet (15 mg total) by mouth daily.   Oxycodone HCl 10 MG TABS, Take 1-2 tablets (10-20 mg total) by mouth every 4 (four) hours as needed (pain).   Current Outpatient Medications (Other):    amoxicillin-clavulanate (AUGMENTIN) 875-125 MG tablet, Take 1 tablet by mouth 2 (two) times daily.   chlorhexidine (PERIDEX) 0.12 % solution, SMARTSIG:Capful(s) By Mouth   DULoxetine (CYMBALTA) 60 MG capsule,  Take 60 mg by mouth at bedtime.   ertapenem (INVANZ) 1 g injection,    gabapentin (NEURONTIN) 300 MG capsule, Take 1 capsule (300 mg total) by mouth 3 (three) times daily.   nicotine (NICODERM CQ - DOSED IN MG/24 HOURS) 14 mg/24hr patch, Place 1 patch (14 mg total) onto the skin daily. (Patient not taking: Reported on 01/26/2021)   sodium chloride flush (NS) 0.9 % SOLN, 10-40 mLs by Intracatheter route as needed (flush). (Patient not taking: Reported on 01/26/2021)   thiamine 100 MG tablet, Take 1 tablet (100 mg total) by mouth daily. (Patient not taking: Reported on 01/26/2021)   tiZANidine (ZANAFLEX) 4 MG tablet, Take 4 mg by mouth 3 (three) times daily.    Reviewed prior external information including notes and imaging from  primary care provider As well as notes that were available from care everywhere and other healthcare systems.  Past medical history, social, surgical and family history all reviewed in electronic medical record.  No pertanent information unless stated regarding to the chief complaint.   Review of Systems:  No headache, visual changes, nausea, vomiting, diarrhea, constipation, dizziness, abdominal pain, skin rash, fevers, chills, night sweats, weight loss, swollen lymph nodes, body aches, joint swelling, chest pain, shortness of breath, mood changes. POSITIVE muscle aches  Objective  Blood pressure (!) 150/90, pulse (!) 101, height 5\' 11"   (1.803 m), weight 255 lb (115.7 kg), SpO2 95 %.   General: No apparent distress alert and oriented x3 mood and affect normal, dressed appropriately.  HEENT: Pupils equal, extraocular movements intact  Respiratory: Patient's speak in full sentences and does not appear short of breath  Cardiovascular: No lower extremity edema, non tender, no erythema  Gait normal with good balance and coordination.  MSK: Patient is sitting comfortably in the chair.  Deferred significant amount of exam today.    Impression and Recommendations:    The above documentation has been reviewed and is accurate and complete Lyndal Pulley, DO

## 2021-02-10 ENCOUNTER — Other Ambulatory Visit: Payer: Self-pay

## 2021-02-10 ENCOUNTER — Ambulatory Visit: Payer: Self-pay

## 2021-02-10 ENCOUNTER — Ambulatory Visit: Payer: Medicare Other | Admitting: Family Medicine

## 2021-02-10 VITALS — BP 150/90 | HR 101 | Ht 71.0 in | Wt 255.0 lb

## 2021-02-10 DIAGNOSIS — R202 Paresthesia of skin: Secondary | ICD-10-CM

## 2021-02-10 DIAGNOSIS — S32009K Unspecified fracture of unspecified lumbar vertebra, subsequent encounter for fracture with nonunion: Secondary | ICD-10-CM

## 2021-02-10 NOTE — Assessment & Plan Note (Signed)
Patient has had surgical treatment for intervention on the back and I do think that this is likely more of a neuropathy from the back that is potentially contributing.  Patient has had difficulty with alcohol abuse but laboratory work-up has been fairly unremarkable for any peripheral neuropathy.  I do feel at this point with patient failing all other therapies that a possible epidural at the L5-S1 area could be beneficial.  Patient will be referred today to see if this will be helpful and then follow-up again in 4 weeks.  Total time reviewing patient's chart 31 minutes

## 2021-02-10 NOTE — Patient Instructions (Signed)
Great to see you  We will get an epidural at L5/S1 to see if it will help the feet.  My hope is it is playing some role.  Can call [phone] to schedule See me then 4 weeks after the injection to check in

## 2021-02-17 ENCOUNTER — Ambulatory Visit
Admission: RE | Admit: 2021-02-17 | Discharge: 2021-02-17 | Disposition: A | Discharge status: Home / Self Care | Payer: Medicare Other | Source: Ambulatory Visit | Attending: Family Medicine | Admitting: Family Medicine

## 2021-02-17 ENCOUNTER — Other Ambulatory Visit: Payer: Self-pay

## 2021-02-17 DIAGNOSIS — L570 Actinic keratosis: Secondary | ICD-10-CM | POA: Diagnosis not present

## 2021-02-17 DIAGNOSIS — L820 Inflamed seborrheic keratosis: Secondary | ICD-10-CM | POA: Diagnosis not present

## 2021-02-17 DIAGNOSIS — R202 Paresthesia of skin: Secondary | ICD-10-CM

## 2021-02-17 DIAGNOSIS — L821 Other seborrheic keratosis: Secondary | ICD-10-CM | POA: Diagnosis not present

## 2021-02-17 DIAGNOSIS — D225 Melanocytic nevi of trunk: Secondary | ICD-10-CM | POA: Diagnosis not present

## 2021-02-17 DIAGNOSIS — X32XXXD Exposure to sunlight, subsequent encounter: Secondary | ICD-10-CM | POA: Diagnosis not present

## 2021-02-17 MED ORDER — IOPAMIDOL (ISOVUE-M 200) INJECTION 41%
1.0000 mL | Freq: Once | INTRAMUSCULAR | Status: AC
  Administered 2021-02-17: 1 mL via EPIDURAL

## 2021-02-17 MED ORDER — METHYLPREDNISOLONE ACETATE 40 MG/ML INJ SUSP (RADIOLOG
80.0000 mg | Freq: Once | INTRAMUSCULAR | Status: AC
  Administered 2021-02-17: 80 mg via EPIDURAL

## 2021-02-17 NOTE — Discharge Instructions (Signed)

## 2021-02-24 ENCOUNTER — Ambulatory Visit (INDEPENDENT_AMBULATORY_CARE_PROVIDER_SITE_OTHER): Payer: Medicare Other | Admitting: Internal Medicine

## 2021-02-24 ENCOUNTER — Other Ambulatory Visit: Payer: Self-pay

## 2021-02-24 ENCOUNTER — Encounter: Payer: Self-pay | Admitting: Internal Medicine

## 2021-02-24 VITALS — BP 132/80 | HR 84 | Temp 98.9°F | Ht 71.0 in | Wt 255.0 lb

## 2021-02-24 DIAGNOSIS — F17200 Nicotine dependence, unspecified, uncomplicated: Secondary | ICD-10-CM

## 2021-02-24 DIAGNOSIS — Z860100 Personal history of colon polyps, unspecified: Secondary | ICD-10-CM

## 2021-02-24 DIAGNOSIS — E7800 Pure hypercholesterolemia, unspecified: Secondary | ICD-10-CM | POA: Diagnosis not present

## 2021-02-24 DIAGNOSIS — R3129 Other microscopic hematuria: Secondary | ICD-10-CM

## 2021-02-24 DIAGNOSIS — E538 Deficiency of other specified B group vitamins: Secondary | ICD-10-CM | POA: Diagnosis not present

## 2021-02-24 DIAGNOSIS — R030 Elevated blood-pressure reading, without diagnosis of hypertension: Secondary | ICD-10-CM

## 2021-02-24 DIAGNOSIS — R739 Hyperglycemia, unspecified: Secondary | ICD-10-CM

## 2021-02-24 DIAGNOSIS — Z0001 Encounter for general adult medical examination with abnormal findings: Secondary | ICD-10-CM | POA: Diagnosis not present

## 2021-02-24 DIAGNOSIS — J309 Allergic rhinitis, unspecified: Secondary | ICD-10-CM

## 2021-02-24 DIAGNOSIS — E559 Vitamin D deficiency, unspecified: Secondary | ICD-10-CM | POA: Diagnosis not present

## 2021-02-24 DIAGNOSIS — F418 Other specified anxiety disorders: Secondary | ICD-10-CM

## 2021-02-24 MED ORDER — ALBUTEROL SULFATE HFA 108 (90 BASE) MCG/ACT IN AERS: 1.0000 | INHALATION_SPRAY | Freq: Four times a day (QID) | RESPIRATORY_TRACT | 11 refills | Status: DC | PRN

## 2021-02-24 MED ORDER — CETIRIZINE HCL 10 MG PO TABS: 10.0000 mg | ORAL_TABLET | Freq: Every day | ORAL | 11 refills | Status: DC

## 2021-02-24 MED ORDER — GUAIFENESIN ER 600 MG PO TB12: 1200.0000 mg | ORAL_TABLET | Freq: Two times a day (BID) | ORAL | 5 refills | Status: DC | PRN

## 2021-02-24 MED ORDER — TRIAMCINOLONE ACETONIDE 55 MCG/ACT NA AERO: 2.0000 | INHALATION_SPRAY | Freq: Every day | NASAL | 12 refills | Status: DC

## 2021-02-24 NOTE — Progress Notes (Signed)
Patient ID: DALEY GOSSE, male   DOB: 12-26-1960, 60 y.o.   MRN: [id number]         Chief Complaint:: wellness exam and hyperglycemia, hld, recent elevated BP, anxiety/depression       HPI:  [name] is a 60 y.o. male here for wellness exam; due for colonoscopy; declines shingrix, o/w up to date                        Also Does have several wks ongoing nasal allergy symptoms with clearish congestion, itch and sneezing, without fever, pain, ST, cough, swelling or wheezing.  BP athome < 140/90.  Pt denies chest pain, increased sob or doe, wheezing, orthopnea, PND, increased LE swelling, palpitations, dizziness or syncope.   Pt denies polydipsia, polyuria, or new focal neuro s/s.   Pt denies wt loss, night sweats, loss of appetite, or other constitutional symptoms    Peak wt in the past over 270.  Denies worsening depressive symptoms, suicidal ideation, or panic; has ongoing anxiety.  Denies urinary symptoms such as dysuria, frequency, urgency, flank pain, hematuria or n/v, fever, chills.  Wt Readings from Last 3 Encounters:  02/24/21 255 lb (115.7 kg)  02/10/21 255 lb (115.7 kg)  01/26/21 256 lb (116.1 kg)   BP Readings from Last 3 Encounters:  02/24/21 132/80  02/17/21 (!) 148/90  02/10/21 (!) 150/90   Immunization History  Administered Date(s) Administered   Influenza Split 03/07/2012   Influenza,inj,Quad PF,6+ Mos 01/13/2017, 01/23/2019, 02/21/2020, 01/26/2021   Influenza-Unspecified 05/03/2014, 01/31/2018   Moderna Sars-Covid-2 Vaccination 07/20/2019, 08/20/2019, 04/12/2020   Pfizer Covid-19 Vaccine Bivalent Booster 51yrs & up 01/26/2021   Pneumococcal Polysaccharide-23 05/03/2008, 07/24/2020   Tdap 05/03/2008, 05/23/2018, 01/23/2019   Health Maintenance Due  Topic Date Due   COLONOSCOPY (Pts 45-21yrs Insurance coverage will need to be confirmed)  09/17/2019      Past Medical History:  Diagnosis Date   Allergy    Anxiety    Arthritis    Back   Chronic back  pain    Colon polyps    COPD (chronic obstructive pulmonary disease) (HCC)    Depression    GERD (gastroesophageal reflux disease)    every now and then   Headache    Hyperlipidemia 09/26/2015   Lumbar surgical wound fluid collection    lumbar wound dehiscence   Pneumonia 2020   Past Surgical History:  Procedure Laterality Date   BACK SURGERY     COLONOSCOPY     COLONOSCOPY W/ BIOPSIES AND POLYPECTOMY     EYE SURGERY     IR US GUIDE BX ASP/DRAIN  11/18/2020   KNEE ARTHROSCOPY WITH MEDIAL MENISECTOMY Left 01/23/2016   Procedure: KNEE ARTHROSCOPY WITH MEDIAL MENISECTOMY;  Surgeon: Melrose Nakayama, MD;  Location: [facility];  Service: Orthopedics;  Laterality: Left;   LUMBAR LAMINECTOMY/DECOMPRESSION MICRODISCECTOMY N/A 06/09/2020   Procedure: Decompressive laminectomy Lumbarone -two;  Surgeon: Eustace Moore, MD;  Location: [facility];  Service: Neurosurgery;  Laterality: N/A;   LUMBAR WOUND DEBRIDEMENT N/A 03/21/2014   Procedure: LUMBAR WOUND DEBRIDEMENT;  Surgeon: Eustace Moore, MD;  Location: [facility];  Service: Neurosurgery;  Laterality: N/A;   MAXIMUM ACCESS (MAS)POSTERIOR LUMBAR INTERBODY FUSION (PLIF) 1 LEVEL N/A 02/14/2014   Procedure: FOR MAXIMUM ACCESS SURGERY POSTERIOR LUMBAR INTERBODY FUSION LUMBAR TWO-THREE;  Surgeon: Eustace Moore, MD;  Location: [facility];  Service: Neurosurgery;  Laterality: N/A;   NECK SURGERY  SHOULDER SURGERY     SPINE SURGERY     TONSILLECTOMY      reports that he has been smoking cigarettes. He has a 22.00 pack-year smoking history. His smokeless tobacco use includes snuff. He reports current alcohol use of about 24.0 standard drinks per week. He reports that he does not use drugs. family history includes Aneurysm in his maternal grandfather; Aneurysm (age of onset: 79) in his maternal aunt; Colon cancer in his maternal grandfather; Colon cancer (age of onset: 36) in his father; Colon polyps in his brother; Depression (age of onset:  55) in his brother; Heart disease in his maternal aunt; Hypertension in his mother; Stomach cancer in his father; Stomach cancer (age of onset: 77) in his paternal uncle; Stomach cancer (age of onset: 81) in his paternal uncle; Stomach cancer (age of onset: 66) in his paternal grandfather; Suicidality in his brother. Allergies  Allergen Reactions   Cefuroxime Axetil Hives   Current Outpatient Medications on File Prior to Visit  Medication Sig Dispense Refill   acetaminophen (TYLENOL) 325 MG tablet Take 650 mg by mouth every 6 (six) hours as needed for mild pain, fever or headache.     amoxicillin-clavulanate (AUGMENTIN) 875-125 MG tablet Take 1 tablet by mouth 2 (two) times daily. 60 tablet 5   atorvastatin (LIPITOR) 10 MG tablet TAKE 1 TABLET BY MOUTH EVERY DAY 90 tablet 0   chlorhexidine (PERIDEX) 0.12 % solution SMARTSIG:Capful(s) By Mouth     DULoxetine (CYMBALTA) 60 MG capsule Take 60 mg by mouth at bedtime.  2   ertapenem (INVANZ) 1 g injection      gabapentin (NEURONTIN) 300 MG capsule Take 1 capsule (300 mg total) by mouth 3 (three) times daily. 90 capsule 3   meloxicam (MOBIC) 15 MG tablet Take 1 tablet (15 mg total) by mouth daily. 90 tablet 3   nicotine (NICODERM CQ - DOSED IN MG/24 HOURS) 14 mg/24hr patch Place 1 patch (14 mg total) onto the skin daily. 28 patch 0   Oxycodone HCl 10 MG TABS Take 1-2 tablets (10-20 mg total) by mouth every 4 (four) hours as needed (pain). 40 tablet 0   sodium chloride flush (NS) 0.9 % SOLN 10-40 mLs by Intracatheter route as needed (flush).     thiamine 100 MG tablet Take 1 tablet (100 mg total) by mouth daily. 30 tablet 0   tiZANidine (ZANAFLEX) 4 MG tablet Take 4 mg by mouth 3 (three) times daily.   2   No current facility-administered medications on file prior to visit.        ROS:  All others reviewed and negative.  Objective        PE:  BP 132/80 (BP Location: Right Arm, Patient Position: Sitting, Cuff Size: Large)   Pulse 84   Temp 98.9  F (37.2 C) (Oral)   Ht 5\' 11"  (1.803 m)   Wt 255 lb (115.7 kg)   SpO2 94%   BMI 35.57 kg/m                 Constitutional: Pt appears in NAD               HENT: Head: NCAT.                Right Ear: External ear normal.                 Left Ear: External ear normal.  Eyes: . Pupils are equal, round, and reactive to light. Conjunctivae and EOM are normal               Nose: without d/c or deformity               Neck: Neck supple. Gross normal ROM               Cardiovascular: Normal rate and regular rhythm.                 Pulmonary/Chest: Effort normal and breath sounds without rales or wheezing.                Abd:  Soft, NT, ND, + BS, no organomegaly               Neurological: Pt is alert. At baseline orientation, motor grossly intact               Skin: Skin is warm. No rashes, no other new lesions, LE edema - none               Psychiatric: Pt behavior is normal without agitation   Micro: none  Cardiac tracings I have personally interpreted today:  none  Pertinent Radiological findings (summarize): none   Lab Results  Component Value Date   WBC 9.5 01/26/2021   HGB 15.1 01/26/2021   HCT 45.4 01/26/2021   PLT 310 01/26/2021   GLUCOSE 94 01/26/2021   CHOL 133 11/17/2020   TRIG 85.0 11/17/2020   HDL 50.50 11/17/2020   LDLDIRECT 150.1 03/07/2012   LDLCALC 65 11/17/2020   ALT 37 11/19/2020   AST 14 (L) 11/19/2020   NA 140 01/26/2021   K 4.4 01/26/2021   CL 105 01/26/2021   CREATININE 0.80 01/26/2021   BUN 13 01/26/2021   CO2 29 01/26/2021   TSH 1.22 11/17/2020   PSA 0.42 11/17/2020   INR 1.0 11/17/2020   HGBA1C 6.5 11/17/2020   Assessment/Plan:  Jaxtin Raimondo Birkland is a 60 y.o. White or Caucasian [1] male with  has a past medical history of Allergy, Anxiety, Arthritis, Chronic back pain, Colon polyps, COPD (chronic obstructive pulmonary disease) ([place]), Depression, GERD (gastroesophageal reflux disease), Headache, Hyperlipidemia (09/26/2015), Lumbar  surgical wound fluid collection, and Pneumonia (2020).  Encounter for well adult exam with abnormal findings Age and sex appropriate education and counseling updated with regular exercise and diet Referrals for preventative services - for colonoscopy Immunizations addressed - declines shingrix Smoking counseling  - cunseled to quit, pt not ready Evidence for depression or other mood disorder - none significant Most recent labs reviewed. I have personally reviewed and have noted: 1) the patient's medical and social history 2) The patient's current medications and supplements 3) The patient's height, weight, and BMI have been recorded in the chart   Smoker Pt counsled to quit, pt not ready  Hyperglycemia Lab Results  Component Value Date   HGBA1C 6.5 11/17/2020   Stable, pt to continue current medical treatment  - diet   Hyperlipidemia Lab Results  Component Value Date   [place] 65 11/17/2020   Stable, pt to continue current statin lipitor 10   Anxious depression Stable overall, declines need for med change or referral for counseling  Elevated blood-pressure reading, without diagnosis of hypertension Improved, cont to follow  BP Readings from Last 3 Encounters:  02/24/21 132/80  02/17/21 (!) 148/90  02/10/21 (!) 150/90    History of colonic polyps Also for colonoscopy as is due  Allergic rhinitis Mild to mod flare, for allegra nasacort asd, to f/u any worsening symptoms or concerns  Microhematuria Declines urology referral for now  Followup: Return in about 1 year (around 02/24/2022).  Cathlean Cower, MD 03/03/2021 10:04 PM [facility]

## 2021-02-24 NOTE — Patient Instructions (Addendum)
Please check on Shingrix coverage by your insurance.   Please take all new medication as prescribed - the zyrtec, nasacort and mucinex  Please continue all other medications as before, and refills have been done if requested - the inhaler  Please have the pharmacy call with any other refills you may need.  Please continue your efforts at being more active, low cholesterol diet, and weight control.  You are otherwise up to date with prevention measures today.  Please keep your appointments with your specialists as you may have planned - ID with possible follow up CT  Please make an Appointment to return for your 1 year visit, or sooner if needed, with Lab testing by Appointment as well, to be done about 3-5 days before at the [facility] (so this is for TWO appointments - please see the scheduling desk as you leave)   Due to the ongoing Covid 19 pandemic, our lab now requires an appointment for any labs done at our office.  If you need labs done and do not have an appointment, please call our office ahead of time to schedule before presenting to the lab for your testing.

## 2021-03-03 ENCOUNTER — Encounter: Payer: Self-pay | Admitting: Internal Medicine

## 2021-03-03 DIAGNOSIS — J309 Allergic rhinitis, unspecified: Secondary | ICD-10-CM | POA: Insufficient documentation

## 2021-03-03 NOTE — Assessment & Plan Note (Signed)
Pt counsled to quit, pt not ready °

## 2021-03-03 NOTE — Assessment & Plan Note (Signed)
Improved, cont to follow  BP Readings from Last 3 Encounters:  02/24/21 132/80  02/17/21 (!) 148/90  02/10/21 (!) 150/90

## 2021-03-03 NOTE — Assessment & Plan Note (Signed)
Lab Results  Component Value Date   HGBA1C 6.5 11/17/2020   Stable, pt to continue current medical treatment  - diet

## 2021-03-03 NOTE — Assessment & Plan Note (Signed)
Age and sex appropriate education and counseling updated with regular exercise and diet Referrals for preventative services - for colonoscopy Immunizations addressed - declines shingrix Smoking counseling  - cunseled to quit, pt not ready Evidence for depression or other mood disorder - none significant Most recent labs reviewed. I have personally reviewed and have noted: 1) the patient's medical and social history 2) The patient's current medications and supplements 3) The patient's height, weight, and BMI have been recorded in the chart

## 2021-03-03 NOTE — Assessment & Plan Note (Signed)
Also for colonoscopy as is due 

## 2021-03-03 NOTE — Assessment & Plan Note (Signed)
Lab Results  Component Value Date   LDLCALC 65 11/17/2020   Stable, pt to continue current statin lipitor 10

## 2021-03-03 NOTE — Assessment & Plan Note (Signed)
Stable overall, declines need for med change or referral for counseling

## 2021-03-03 NOTE — Assessment & Plan Note (Signed)
Mild to mod flare, for allegra nasacort asd, to f/u any worsening symptoms or concerns

## 2021-03-03 NOTE — Assessment & Plan Note (Signed)
Declines urology referral for now

## 2021-03-17 ENCOUNTER — Other Ambulatory Visit: Payer: Self-pay | Admitting: Internal Medicine

## 2021-03-17 NOTE — Telephone Encounter (Signed)
Please refill as per office routine med refill policy (all routine meds to be refilled for 3 mo or monthly (per pt preference) up to one year from last visit, then month to month grace period for 3 mo, then further med refills will have to be denied) ? ?

## 2021-03-19 ENCOUNTER — Other Ambulatory Visit: Payer: Self-pay | Admitting: Infectious Disease

## 2021-03-19 ENCOUNTER — Ambulatory Visit: Payer: Medicare Other | Admitting: Infectious Disease

## 2021-03-19 ENCOUNTER — Other Ambulatory Visit (HOSPITAL_COMMUNITY): Payer: Self-pay

## 2021-03-19 ENCOUNTER — Other Ambulatory Visit: Payer: Self-pay

## 2021-03-19 ENCOUNTER — Encounter: Payer: Self-pay | Admitting: Infectious Disease

## 2021-03-19 VITALS — BP 142/91 | HR 92 | Temp 99.1°F | Wt 255.0 lb

## 2021-03-19 DIAGNOSIS — M272 Inflammatory conditions of jaws: Secondary | ICD-10-CM

## 2021-03-19 DIAGNOSIS — I10 Essential (primary) hypertension: Secondary | ICD-10-CM

## 2021-03-19 DIAGNOSIS — Z23 Encounter for immunization: Secondary | ICD-10-CM | POA: Diagnosis not present

## 2021-03-19 DIAGNOSIS — M5416 Radiculopathy, lumbar region: Secondary | ICD-10-CM | POA: Diagnosis not present

## 2021-03-19 DIAGNOSIS — F17200 Nicotine dependence, unspecified, uncomplicated: Secondary | ICD-10-CM

## 2021-03-19 DIAGNOSIS — R030 Elevated blood-pressure reading, without diagnosis of hypertension: Secondary | ICD-10-CM | POA: Diagnosis not present

## 2021-03-19 MED ORDER — AMOXICILLIN-POT CLAVULANATE 875-125 MG PO TABS
1.0000 | ORAL_TABLET | Freq: Two times a day (BID) | ORAL | 5 refills | Status: DC
Start: 2021-03-19 — End: 2021-06-16

## 2021-03-19 NOTE — Progress Notes (Addendum)
Subjective:  Chief complaint: Some tenderness along his mandible and a sensation of the ears being full when he tilts his head backwards.   Patient ID: [name], male    DOB: [birth year], 60 y.o.   MRN: [id number]  HPI  60 year old with hx of history of lumbar surgery COPD smoking who had a root canal that then became infected odontogenic infection which spread with osteomyelitis of the jaw and an abscess in the muscles of mastication status post IR guided drainage of abscess with culture sent which yielded Streptococcus Intermediums which was PCN Sensitive.   We saw him in the hospital and set him up with IV ertapenem which he is continued on.  He has seen an oral maxillofacial surgeon who apparently saw some bone protruding in his jaw that was not covered with tissue and he debrided this.  I was  concerned that he still may have a tooth that needs to be removed that is the source of his mandibular osteomyelitis.  He had had a root canal performed before the osteomyelitis set in the OR on CT maxillofacial there was no abnormalities immediately around the root canal.  His dentist had called me and was not wanting to remove the tooth where the root canal was performed because of there being no overt infection there and also no evidence on CT scan of that site actually being infected is quite curious in the sense that 1 would expect that a tooth would be involved near an area of mandibular osteomyelitis.  Patient was going to have the tooth removed today actually but I then again reviewed my discussion with his dentist.    The last visit he was still bothered though by how his teeth feel at times when he closes his teeth both back where the root canal is and also when he closes the teeth in the front of his mouth he has a sensation towards the back.  He also had a sensation of fullness in his ear when he tilts his head backwards.  Today he continues to have tenderness along the mandible  and persistent fullness in the ear particularly when again he tilts his head backwards.       Past Medical History:  Diagnosis Date   Allergy    Anxiety    Arthritis    Back   Chronic back pain    Colon polyps    COPD (chronic obstructive pulmonary disease) (HCC)    Depression    GERD (gastroesophageal reflux disease)    every now and then   Headache    Hyperlipidemia 09/26/2015   Lumbar surgical wound fluid collection    lumbar wound dehiscence   Pneumonia 2020    Past Surgical History:  Procedure Laterality Date   BACK SURGERY     COLONOSCOPY     COLONOSCOPY W/ BIOPSIES AND POLYPECTOMY     EYE SURGERY     IR US GUIDE BX ASP/DRAIN  11/18/2020   KNEE ARTHROSCOPY WITH MEDIAL MENISECTOMY Left 01/23/2016   Procedure: KNEE ARTHROSCOPY WITH MEDIAL MENISECTOMY;  Surgeon: Melrose Nakayama, MD;  Location: [place];  Service: Orthopedics;  Laterality: Left;   LUMBAR LAMINECTOMY/DECOMPRESSION MICRODISCECTOMY N/A 06/09/2020   Procedure: Decompressive laminectomy Lumbarone -two;  Surgeon: Eustace Moore, MD;  Location: [place];  Service: Neurosurgery;  Laterality: N/A;   LUMBAR WOUND DEBRIDEMENT N/A 03/21/2014   Procedure: LUMBAR WOUND DEBRIDEMENT;  Surgeon: Eustace Moore, MD;  Location: [facility];  Service: Neurosurgery;  Laterality: N/A;   MAXIMUM ACCESS (MAS)POSTERIOR LUMBAR INTERBODY FUSION (PLIF) 1 LEVEL N/A 02/14/2014   Procedure: FOR MAXIMUM ACCESS SURGERY POSTERIOR LUMBAR INTERBODY FUSION LUMBAR TWO-THREE;  Surgeon: Eustace Moore, MD;  Location: [place] [facility];  Service: Neurosurgery;  Laterality: N/A;   NECK SURGERY     SHOULDER SURGERY     SPINE SURGERY     TONSILLECTOMY      Family History  Problem Relation Age of Onset   Colon cancer Father 61       pt thinks father had colon cancer   Stomach cancer Father    Hypertension Mother    Colon polyps Brother    Aneurysm Maternal Aunt 51       brain   Heart disease Maternal Aunt        d 41   Stomach cancer  Paternal Uncle 17   Aneurysm Maternal Grandfather        stomach   Colon cancer Maternal Grandfather        thinks grandfather had colon cancer   Stomach cancer Paternal Grandfather 87   Depression Brother 23   Suicidality Brother    Stomach cancer Paternal Uncle 59   Esophageal cancer Neg Hx    Rectal cancer Neg Hx    Diabetes Neg Hx       Social History   Socioeconomic History   Marital status: Married    Spouse name: cindy   Number of children: Not on file   Years of education: Not on file   Highest education level: Not on file  Occupational History   Occupation: produce manager/ disability    Employer: [employer]  Tobacco Use   Smoking status: Every Day    Packs/day: 0.50    Years: 44.00    Pack years: 22.00    Types: Cigarettes   Smokeless tobacco: Current    Types: Snuff  Vaping Use   Vaping Use: Never used  Substance and Sexual Activity   Alcohol use: Yes    Alcohol/week: 24.0 standard drinks    Types: 24 Standard drinks or equivalent per week    Comment: social beer   Drug use: No   Sexual activity: Yes    Partners: Female  Other Topics Concern   Not on file  Social History Narrative   Exercise--no   Social Determinants of Health   Financial Resource Strain: Low Risk    Difficulty of Paying Living Expenses: Not hard at all  Food Insecurity: No Food Insecurity   Worried About Charity fundraiser in the Last Year: Never true   Arboriculturist in the Last Year: Never true  Transportation Needs: No Transportation Needs   Lack of Transportation (Medical): No   Lack of Transportation (Non-Medical): No  Physical Activity: Inactive   Days of Exercise per Week: 0 days   Minutes of Exercise per Session: 0 min  Stress: No Stress Concern Present   Feeling of Stress : Not at all  Social Connections: Not on file    Allergies  Allergen Reactions   Cefuroxime Axetil Hives     Current Outpatient Medications:    acetaminophen (TYLENOL) 325 MG tablet,  Take 650 mg by mouth every 6 (six) hours as needed for mild pain, fever or headache., Disp: , Rfl:    albuterol (VENTOLIN HFA) 108 (90 Base) MCG/ACT inhaler, Inhale 1-2 puffs into the lungs every 6 (six) hours as needed for wheezing or shortness of  breath., Disp: 8.5 each, Rfl: 11   amoxicillin-clavulanate (AUGMENTIN) 875-125 MG tablet, Take 1 tablet by mouth 2 (two) times daily., Disp: 60 tablet, Rfl: 5   atorvastatin (LIPITOR) 10 MG tablet, TAKE 1 TABLET BY MOUTH EVERY DAY, Disp: 90 tablet, Rfl: 3   cetirizine (ZYRTEC) 10 MG tablet, Take 1 tablet (10 mg total) by mouth daily., Disp: 30 tablet, Rfl: 11   chlorhexidine (PERIDEX) 0.12 % solution, SMARTSIG:Capful(s) By Mouth, Disp: , Rfl:    DULoxetine (CYMBALTA) 60 MG capsule, Take 60 mg by mouth at bedtime., Disp: , Rfl: 2   ertapenem (INVANZ) 1 g injection, , Disp: , Rfl:    gabapentin (NEURONTIN) 300 MG capsule, Take 1 capsule (300 mg total) by mouth 3 (three) times daily., Disp: 90 capsule, Rfl: 3   guaiFENesin (MUCINEX) 600 MG 12 hr tablet, Take 2 tablets (1,200 mg total) by mouth 2 (two) times daily as needed., Disp: 60 tablet, Rfl: 5   meloxicam (MOBIC) 15 MG tablet, Take 1 tablet (15 mg total) by mouth daily., Disp: 90 tablet, Rfl: 3   nicotine (NICODERM CQ - DOSED IN MG/24 HOURS) 14 mg/24hr patch, Place 1 patch (14 mg total) onto the skin daily., Disp: 28 patch, Rfl: 0   Oxycodone HCl 10 MG TABS, Take 1-2 tablets (10-20 mg total) by mouth every 4 (four) hours as needed (pain)., Disp: 40 tablet, Rfl: 0   sodium chloride flush (NS) 0.9 % SOLN, 10-40 mLs by Intracatheter route as needed (flush)., Disp: , Rfl:    thiamine 100 MG tablet, Take 1 tablet (100 mg total) by mouth daily., Disp: 30 tablet, Rfl: 0   tiZANidine (ZANAFLEX) 4 MG tablet, Take 4 mg by mouth 3 (three) times daily. , Disp: , Rfl: 2   triamcinolone (NASACORT) 55 MCG/ACT AERO nasal inhaler, Place 2 sprays into the nose daily., Disp: 1 each, Rfl: 12   Review of Systems   Constitutional:  Negative for activity change, appetite change, chills, diaphoresis, fatigue, fever and unexpected weight change.  HENT:  Positive for ear pain. Negative for congestion, rhinorrhea, sinus pressure, sneezing, sore throat and trouble swallowing.   Eyes:  Negative for photophobia and visual disturbance.  Respiratory:  Negative for cough, chest tightness, shortness of breath, wheezing and stridor.   Cardiovascular:  Negative for chest pain, palpitations and leg swelling.  Gastrointestinal:  Negative for abdominal distention, abdominal pain, anal bleeding, blood in stool, constipation, diarrhea, nausea and vomiting.  Genitourinary:  Negative for difficulty urinating, dysuria, flank pain and hematuria.  Musculoskeletal:  Negative for arthralgias, back pain, gait problem, joint swelling and myalgias.  Skin:  Negative for color change, pallor, rash and wound.  Neurological:  Negative for dizziness, tremors, weakness and light-headedness.  Hematological:  Negative for adenopathy. Does not bruise/bleed easily.  Psychiatric/Behavioral:  Negative for agitation, behavioral problems, confusion, decreased concentration, dysphoric mood and sleep disturbance.       Objective:   Physical Exam Constitutional:      Appearance: He is well-developed.  HENT:     Head: Normocephalic and atraumatic.     Jaw: Tenderness present.  Eyes:     Conjunctiva/sclera: Conjunctivae normal.  Cardiovascular:     Rate and Rhythm: Normal rate and regular rhythm.  Pulmonary:     Effort: Pulmonary effort is normal. No respiratory distress.     Breath sounds: No wheezing.  Abdominal:     General: There is no distension.     Palpations: Abdomen is soft.  Musculoskeletal:  General: No tenderness. Normal range of motion.     Cervical back: Normal range of motion and neck supple.  Skin:    General: Skin is warm and dry.     Coloration: Skin is not pale.     Findings: No erythema or rash.   Neurological:     General: No focal deficit present.     Mental Status: He is alert and oriented to person, place, and time.  Psychiatric:        Mood and Affect: Mood normal.        Behavior: Behavior normal.        Thought Content: Thought content normal.        Judgment: Judgment normal.            Assessment & Plan:  Mandibular osteomyelitis presumably of an odontogenic source.  It is curious that there is no overt evidence ever of infection around the root canal which was performed prior to him developing this bone infection.  Dentist does not believe that that site is infected based on exam and the CT scan also fails to show this.  I am obtaining CBC differential BMP with GFR.  I am obtaining a sed rate and CRP  I am ordering a CT maxillofacial with IV contrast.  This should also catch part of the ear as well.  I am continue his prescription for Augmentin and we will plan on seeing him back in February.  If the CT scan shows dramatic changes 1 where the other we could consider changing the time of his appointment.    Hypertension: vitals reviewed and he is followed by Dr. [name]  Vitals:   03/19/21 0831  BP: (!) 142/91  Pulse: 92  Temp: 99.1 F (37.3 C)  SpO2: 98%    Smoking encouraged to quit smoking

## 2021-03-20 LAB — CBC WITH DIFFERENTIAL/PLATELET
Absolute Monocytes: 639 cells/uL (ref 200–950)
Basophils Absolute: 92 cells/uL (ref 0–200)
Basophils Relative: 1.2 %
Eosinophils Absolute: 308 cells/uL (ref 15–500)
Eosinophils Relative: 4 %
HCT: 47.1 % (ref 38.5–50.0)
Hemoglobin: 16 g/dL (ref 13.2–17.1)
Lymphs Abs: 1863 cells/uL (ref 850–3900)
MCH: 31.5 pg (ref 27.0–33.0)
MCHC: 34 g/dL (ref 32.0–36.0)
MCV: 92.7 fL (ref 80.0–100.0)
MPV: 9.8 fL (ref 7.5–12.5)
Monocytes Relative: 8.3 %
Neutro Abs: 4797 cells/uL (ref 1500–7800)
Neutrophils Relative %: 62.3 %
Platelets: 269 10*3/uL (ref 140–400)
RBC: 5.08 10*6/uL (ref 4.20–5.80)
RDW: 12.6 % (ref 11.0–15.0)
Total Lymphocyte: 24.2 %
WBC: 7.7 10*3/uL (ref 3.8–10.8)

## 2021-03-20 LAB — BASIC METABOLIC PANEL WITH GFR
BUN: 14 mg/dL (ref 7–25)
CO2: 28 mmol/L (ref 20–32)
Calcium: 9.8 mg/dL (ref 8.6–10.3)
Chloride: 105 mmol/L (ref 98–110)
Creat: 0.75 mg/dL (ref 0.70–1.30)
Glucose, Bld: 118 mg/dL — ABNORMAL HIGH (ref 65–99)
Potassium: 4.8 mmol/L (ref 3.5–5.3)
Sodium: 142 mmol/L (ref 135–146)
eGFR: 104 mL/min/{1.73_m2} (ref >=60)

## 2021-03-20 LAB — SEDIMENTATION RATE: Sed Rate: 2 mm/h (ref 0–20)

## 2021-03-20 LAB — C-REACTIVE PROTEIN: CRP: 2.1 mg/L (ref <8.0)

## 2021-03-23 NOTE — Progress Notes (Signed)
[place] Phone: [phone] Subjective:   Richard [name], am serving as a scribe for Richard [name]. This visit occurred during the SARS-CoV-2 public health emergency.  Safety protocols were in place, including screening questions prior to the visit, additional usage of staff PPE, and extensive cleaning of exam room while observing appropriate contact time as indicated for disinfecting solutions.   I'm seeing this patient by the request  of:  Richard Borg, MD  CC: Back pain and foot pain  [id number]  02/10/2021 Patient has had surgical treatment for intervention on the back and I do think that this is likely more of a neuropathy from the back that is potentially contributing.  Patient has had difficulty with alcohol abuse but laboratory work-up has been fairly unremarkable for any peripheral neuropathy.  I do feel at this point with patient failing all other therapies that a possible epidural at the L5-S1 area could be beneficial.  Patient will be referred today to see if this will be helpful and then follow-up again in 4 weeks.  Total time reviewing patient's chart 31 minutes  Update 03/23/2021 Richard [name] is a 60 y.o. male coming in with complaint of lumbar spine pain with paresthesia of both feet. Epidural on 02/17/2021. Patient states that he was digging yesterday and his back has flared up. Epidural did not help with foot numbness but did help somewhat with back pain. Surgeon said that epidural will not do anything for his feet. Is going to get spinal stimulator.  Patient states that that foot pain is still there as well but seems to be the back pain is the worst. Patient is continuing to follow-up with infectious disease secondary to the osteomyelitis of the jaw.  Awaiting CT of the maxillary area.  Patient is on chronic antibiotics at this point.    Past Medical History:  Diagnosis Date   Allergy     Anxiety    Arthritis    Back   Chronic back pain    Colon polyps    COPD (chronic obstructive pulmonary disease) (HCC)    Depression    GERD (gastroesophageal reflux disease)    every now and then   Headache    Hyperlipidemia 09/26/2015   Lumbar surgical wound fluid collection    lumbar wound dehiscence   Pneumonia 2020   Past Surgical History:  Procedure Laterality Date   BACK SURGERY     COLONOSCOPY     COLONOSCOPY W/ BIOPSIES AND POLYPECTOMY     EYE SURGERY     IR US GUIDE BX ASP/DRAIN  11/18/2020   KNEE ARTHROSCOPY WITH MEDIAL MENISECTOMY Left 01/23/2016   Procedure: KNEE ARTHROSCOPY WITH MEDIAL MENISECTOMY;  Surgeon: Richard Nakayama, MD;  Location: [place];  Service: Orthopedics;  Laterality: Left;   LUMBAR LAMINECTOMY/DECOMPRESSION MICRODISCECTOMY N/A 06/09/2020   Procedure: Decompressive laminectomy Lumbarone -two;  Surgeon: Richard Moore, MD;  Location: [place];  Service: Neurosurgery;  Laterality: N/A;   LUMBAR WOUND DEBRIDEMENT N/A 03/21/2014   Procedure: LUMBAR WOUND DEBRIDEMENT;  Surgeon: Richard Moore, MD;  Location: [place] [facility];  Service: Neurosurgery;  Laterality: N/A;   MAXIMUM ACCESS (MAS)POSTERIOR LUMBAR INTERBODY FUSION (PLIF) 1 LEVEL N/A 02/14/2014   Procedure: FOR MAXIMUM ACCESS SURGERY POSTERIOR LUMBAR INTERBODY FUSION LUMBAR TWO-THREE;  Surgeon: Richard Moore, MD;  Location: [facility];  Service: Neurosurgery;  Laterality: N/A;   NECK SURGERY  SHOULDER SURGERY     SPINE SURGERY     TONSILLECTOMY     Social History   Socioeconomic History   Marital status: Married    Spouse name: cindy   Number of children: Not on file   Years of education: Not on file   Highest education level: Not on file  Occupational History   Occupation: produce manager/ disability    Employer: [employer]  Tobacco Use   Smoking status: Every Day    Packs/day: 0.50    Years: 44.00    Pack years: 22.00    Types: Cigarettes   Smokeless tobacco: Current     Types: Snuff  Vaping Use   Vaping Use: Never used  Substance and Sexual Activity   Alcohol use: Yes    Alcohol/week: 24.0 standard drinks    Types: 24 Standard drinks or equivalent per week    Comment: social beer   Drug use: [name]   Sexual activity: Yes    Partners: Female  Other Topics Concern   Not on file  Social History Narrative   Exercise--[name]   Social Determinants of Health   Financial Resource Strain: Low Risk    Difficulty of Paying Living Expenses: Not hard at all  Food Insecurity: [name] Food Insecurity   Worried About Charity fundraiser in the Last Year: Never true   Arboriculturist in the Last Year: Never true  Transportation Needs: [name] Transportation Needs   Lack of Transportation (Medical): [name]   Lack of Transportation (Non-Medical): [name]  Physical Activity: Inactive   Days of Exercise per Week: 0 days   Minutes of Exercise per Session: 0 min  Stress: [name] Stress Concern Present   Feeling of Stress : Not at all  Social Connections: Not on file   Allergies  Allergen Reactions   Cefuroxime Axetil Hives   Family History  Problem Relation Age of Onset   Colon cancer Father 3       pt thinks father had colon cancer   Stomach cancer Father    Hypertension Mother    Colon polyps Brother    Aneurysm Maternal Aunt 11       brain   Heart disease Maternal Aunt        d 81   Stomach cancer Paternal Uncle 83   Aneurysm Maternal Grandfather        stomach   Colon cancer Maternal Grandfather        thinks grandfather had colon cancer   Stomach cancer Paternal Grandfather 50   Depression Brother 67   Suicidality Brother    Stomach cancer Paternal Uncle 72   Esophageal cancer Neg Hx    Rectal cancer Neg Hx    Diabetes Neg Hx      Current Outpatient Medications (Cardiovascular):    atorvastatin (LIPITOR) 10 MG tablet, TAKE 1 TABLET BY MOUTH EVERY DAY  Current Outpatient Medications (Respiratory):    albuterol (VENTOLIN HFA) 108 (90 Base) MCG/ACT inhaler,  Inhale 1-2 puffs into the lungs every 6 (six) hours as needed for wheezing or shortness of breath.   cetirizine (ZYRTEC) 10 MG tablet, Take 1 tablet (10 mg total) by mouth daily.   guaiFENesin (MUCINEX) 600 MG 12 hr tablet, Take 2 tablets (1,200 mg total) by mouth 2 (two) times daily as needed.   triamcinolone (NASACORT) 55 MCG/ACT AERO nasal inhaler, Place 2 sprays into the nose daily.  Current Outpatient Medications (Analgesics):    acetaminophen (TYLENOL) 325 MG  tablet, Take 650 mg by mouth every 6 (six) hours as needed for mild pain, fever or headache.   meloxicam (MOBIC) 15 MG tablet, Take 1 tablet (15 mg total) by mouth daily.   Oxycodone HCl 10 MG TABS, Take 1-2 tablets (10-20 mg total) by mouth every 4 (four) hours as needed (pain).   Current Outpatient Medications (Other):    amoxicillin-clavulanate (AUGMENTIN) 875-125 MG tablet, Take 1 tablet by mouth 2 (two) times daily.   chlorhexidine (PERIDEX) 0.12 % solution, SMARTSIG:Capful(s) By Mouth   DULoxetine (CYMBALTA) 60 MG capsule, Take 60 mg by mouth at bedtime.   gabapentin (NEURONTIN) 300 MG capsule, Take 1 capsule (300 mg total) by mouth 3 (three) times daily.   nicotine (NICODERM CQ - DOSED IN MG/24 HOURS) 14 mg/24hr patch, Place 1 patch (14 mg total) onto the skin daily.   sodium chloride flush (NS) 0.9 % SOLN, 10-40 mLs by Intracatheter route as needed (flush).   thiamine 100 MG tablet, Take 1 tablet (100 mg total) by mouth daily.   tiZANidine (ZANAFLEX) 4 MG tablet, Take 4 mg by mouth 3 (three) times daily.    Reviewed prior external information including notes and imaging from  primary care provider As well as notes that were available from care everywhere and other healthcare systems.  Past medical history, social, surgical and family history all reviewed in electronic medical record.  [name] pertanent information unless stated regarding to the chief complaint.   Review of Systems:  [name] headache, visual changes, nausea,  vomiting, diarrhea, constipation, dizziness, abdominal pain, skin rash, fevers, chills, night sweats, weight loss, swollen lymph nodes, joint swelling, chest pain, shortness of breath, mood changes. POSITIVE muscle aches, body aches  Objective  Blood pressure 112/82, pulse 89, height 5\' 11"  (1.803 m), weight 253 lb (114.8 kg), SpO2 97 %.   General: [name] apparent distress alert and oriented x3 mood and affect normal, dressed appropriately.  HEENT: Pupils equal, extraocular movements intact  Respiratory: Patient's speak in full sentences and does not appear short of breath  Cardiovascular: [name] lower extremity edema, non tender, [name] erythema  Gait normal with good balance and coordination.  MSK:  N\back exam shows the patient does have loss of lordosis.  Patient does have voluntary guarding noted.  Significant tightness noted with straight leg test.  Patient does even appear to be uncomfortable at rest.  Neurovascularly does have some numbness of the lower extremity especially the feet.  Continuing to have more of a burning sensation in the feet.   After verbal consent patient was prepped with alcohol swab and into the right gluteal area patient was injected with 30 mg of Toradol.  [name] blood loss.  Band-Aid placed.  Postinjection instructions given.  After verbal consent patient was prepped with alcohol swab into the left gluteal area patient was injected with 60 mg/mL of Depo-Medrol.  [name] blood loss.  Band-Aid placed.  Postinjection scan given.   After verbal consent patient was prepped with alcohol swab and with a 25-gauge half inch needle injected into the left deltoid area with a total of 1000 mcg per mL of B12.  [name] blood loss.  Band-Aid placed.  Postinjection instructions given   Impression and Recommendations:     The above documentation has been reviewed and is accurate and complete Lyndal Pulley, DO

## 2021-03-24 ENCOUNTER — Encounter: Payer: Self-pay | Admitting: Family Medicine

## 2021-03-24 ENCOUNTER — Ambulatory Visit: Payer: Medicare Other | Admitting: Family Medicine

## 2021-03-24 ENCOUNTER — Other Ambulatory Visit: Payer: Self-pay

## 2021-03-24 VITALS — BP 112/82 | HR 89 | Ht 71.0 in | Wt 253.0 lb

## 2021-03-24 DIAGNOSIS — S32009K Unspecified fracture of unspecified lumbar vertebra, subsequent encounter for fracture with nonunion: Secondary | ICD-10-CM | POA: Diagnosis not present

## 2021-03-24 DIAGNOSIS — M2550 Pain in unspecified joint: Secondary | ICD-10-CM

## 2021-03-24 MED ORDER — CYANOCOBALAMIN 1000 MCG/ML IJ SOLN
1000.0000 ug | Freq: Once | INTRAMUSCULAR | Status: AC
  Administered 2021-03-24: 1000 ug via INTRAMUSCULAR

## 2021-03-24 MED ORDER — METHYLPREDNISOLONE ACETATE 40 MG/ML IJ SUSP
40.0000 mg | Freq: Once | INTRAMUSCULAR | Status: AC
  Administered 2021-03-24: 40 mg via INTRAMUSCULAR

## 2021-03-24 MED ORDER — KETOROLAC TROMETHAMINE 30 MG/ML IJ SOLN
30.0000 mg | Freq: Once | INTRAMUSCULAR | Status: AC
  Administered 2021-03-24: 30 mg via INTRAMUSCULAR

## 2021-03-24 NOTE — Assessment & Plan Note (Signed)
Patient does have more muscle tightness noted.  Given a Toradol and Depo-Medrol today to help with some of the discomfort and pain.  Patient is seeing a neurosurgeon to discuss the possibility of a stimulator.  Continue the surgical medicine for this chronic problem with exacerbation including the Cymbalta and the gabapentin.  Patient is to hold his meloxicam for the next 36 hours and then can restart.  Patient does have pain medications he can use as well and does have the muscle relaxer.  Follow-up with me again in 2 to 3 months otherwise.  Patient knows if any worsening symptoms to seek medical attention immediately.

## 2021-03-24 NOTE — Patient Instructions (Signed)
If worsening pain over weekend, seek medical attention Follow up with surgeon for stimulator Send message if B12 made a difference See me again in 6 weeks if you need [place]

## 2021-04-01 DIAGNOSIS — M5416 Radiculopathy, lumbar region: Secondary | ICD-10-CM | POA: Diagnosis not present

## 2021-04-01 DIAGNOSIS — I10 Essential (primary) hypertension: Secondary | ICD-10-CM | POA: Diagnosis not present

## 2021-04-01 DIAGNOSIS — Z9889 Other specified postprocedural states: Secondary | ICD-10-CM | POA: Diagnosis not present

## 2021-04-08 ENCOUNTER — Ambulatory Visit
Admission: RE | Admit: 2021-04-08 | Discharge: 2021-04-08 | Disposition: A | Discharge status: Home / Self Care | Payer: Medicare Other | Source: Ambulatory Visit | Attending: Infectious Disease | Admitting: Infectious Disease

## 2021-04-08 ENCOUNTER — Other Ambulatory Visit: Payer: Self-pay

## 2021-04-08 DIAGNOSIS — L0201 Cutaneous abscess of face: Secondary | ICD-10-CM | POA: Diagnosis not present

## 2021-04-08 DIAGNOSIS — M272 Inflammatory conditions of jaws: Secondary | ICD-10-CM

## 2021-04-08 MED ORDER — IOPAMIDOL (ISOVUE-300) INJECTION 61%
75.0000 mL | Freq: Once | INTRAVENOUS | Status: AC | PRN
  Administered 2021-04-08: 75 mL via INTRAVENOUS

## 2021-04-14 DIAGNOSIS — M5416 Radiculopathy, lumbar region: Secondary | ICD-10-CM | POA: Diagnosis not present

## 2021-04-14 DIAGNOSIS — Z9889 Other specified postprocedural states: Secondary | ICD-10-CM | POA: Diagnosis not present

## 2021-04-14 DIAGNOSIS — M7061 Trochanteric bursitis, right hip: Secondary | ICD-10-CM | POA: Diagnosis not present

## 2021-04-30 NOTE — Progress Notes (Deleted)
[facility] [place] Phone: [phone] Subjective:    I'm seeing this patient by the request  of:  [name], MD  CC:   [id number]  03/24/2021 Patient does have more muscle tightness noted.  Given a Toradol and Depo-Medrol today to help with some of the discomfort and pain.  Patient is seeing a neurosurgeon to discuss the possibility of a stimulator.  Continue the surgical medicine for this chronic problem with exacerbation including the Cymbalta and the gabapentin.  Patient is to hold his meloxicam for the next 36 hours and then can restart.  Patient does have pain medications he can use as well and does have the muscle relaxer.  Follow-up with me again in 2 to 3 months otherwise.  Patient knows if any worsening symptoms to seek medical attention immediately  Update 05/05/2021 [name] is a 60 y.o. male coming in with complaint of lumbar spine pain. Patient states       Past Medical History:  Diagnosis Date   Allergy    Anxiety    Arthritis    Back   Chronic back pain    Colon polyps    COPD (chronic obstructive pulmonary disease) (HCC)    Depression    GERD (gastroesophageal reflux disease)    every now and then   Headache    Hyperlipidemia 09/26/2015   Lumbar surgical wound fluid collection    lumbar wound dehiscence   Pneumonia 2020   Past Surgical History:  Procedure Laterality Date   BACK SURGERY     COLONOSCOPY     COLONOSCOPY W/ BIOPSIES AND POLYPECTOMY     EYE SURGERY     IR US GUIDE BX ASP/DRAIN  11/18/2020   KNEE ARTHROSCOPY WITH MEDIAL MENISECTOMY Left 01/23/2016   Procedure: KNEE ARTHROSCOPY WITH MEDIAL MENISECTOMY;  Surgeon: Melrose Nakayama, MD;  Location: [facility];  Service: Orthopedics;  Laterality: Left;   LUMBAR LAMINECTOMY/DECOMPRESSION MICRODISCECTOMY N/A 06/09/2020   Procedure: Decompressive laminectomy Lumbarone -two;  Surgeon: Eustace Moore, MD;  Location:  [facility];  Service: Neurosurgery;  Laterality: N/A;   LUMBAR WOUND DEBRIDEMENT N/A 03/21/2014   Procedure: LUMBAR WOUND DEBRIDEMENT;  Surgeon: Eustace Moore, MD;  Location: [facility];  Service: Neurosurgery;  Laterality: N/A;   MAXIMUM ACCESS (MAS)POSTERIOR LUMBAR INTERBODY FUSION (PLIF) 1 LEVEL N/A 02/14/2014   Procedure: FOR MAXIMUM ACCESS SURGERY POSTERIOR LUMBAR INTERBODY FUSION LUMBAR TWO-THREE;  Surgeon: Eustace Moore, MD;  Location: [facility];  Service: Neurosurgery;  Laterality: N/A;   NECK SURGERY     SHOULDER SURGERY     SPINE SURGERY     TONSILLECTOMY     Social History   Socioeconomic History   Marital status: Married    Spouse name: cindy   Number of children: Not on file   Years of education: Not on file   Highest education level: Not on file  Occupational History   Occupation: produce manager/ disability    Employer: [employer]  Tobacco Use   Smoking status: Every Day    Packs/day: 0.50    Years: 44.00    Pack years: 22.00    Types: Cigarettes   Smokeless tobacco: Current    Types: Snuff  Vaping Use   Vaping Use: Never used  Substance and Sexual Activity   Alcohol use: Yes    Alcohol/week: 24.0 standard drinks    Types: 24 Standard drinks or equivalent per week  Comment: social beer   Drug use: No   Sexual activity: Yes    Partners: Female  Other Topics Concern   Not on file  Social History Narrative   Exercise--no   Social Determinants of Health   Financial Resource Strain: Low Risk    Difficulty of Paying Living Expenses: Not hard at all  Food Insecurity: No Food Insecurity   Worried About Charity fundraiser in the Last Year: Never true   Arboriculturist in the Last Year: Never true  Transportation Needs: No Transportation Needs   Lack of Transportation (Medical): No   Lack of Transportation (Non-Medical): No  Physical Activity: Inactive   Days of Exercise per Week: 0 days   Minutes of Exercise per Session: 0 min  Stress: No Stress  Concern Present   Feeling of Stress : Not at all  Social Connections: Not on file   Allergies  Allergen Reactions   Cefuroxime Axetil Hives   Family History  Problem Relation Age of Onset   Colon cancer Father 71       pt thinks father had colon cancer   Stomach cancer Father    Hypertension Mother    Colon polyps Brother    Aneurysm Maternal Aunt 15       brain   Heart disease Maternal Aunt        d 87   Stomach cancer Paternal Uncle 90   Aneurysm Maternal Grandfather        stomach   Colon cancer Maternal Grandfather        thinks grandfather had colon cancer   Stomach cancer Paternal Grandfather 60   Depression Brother 57   Suicidality Brother    Stomach cancer Paternal Uncle 20   Esophageal cancer Neg Hx    Rectal cancer Neg Hx    Diabetes Neg Hx      Current Outpatient Medications (Cardiovascular):    atorvastatin (LIPITOR) 10 MG tablet, TAKE 1 TABLET BY MOUTH EVERY DAY  Current Outpatient Medications (Respiratory):    albuterol (VENTOLIN HFA) 108 (90 Base) MCG/ACT inhaler, Inhale 1-2 puffs into the lungs every 6 (six) hours as needed for wheezing or shortness of breath.   cetirizine (ZYRTEC) 10 MG tablet, Take 1 tablet (10 mg total) by mouth daily.   guaiFENesin (MUCINEX) 600 MG 12 hr tablet, Take 2 tablets (1,200 mg total) by mouth 2 (two) times daily as needed.   triamcinolone (NASACORT) 55 MCG/ACT AERO nasal inhaler, Place 2 sprays into the nose daily.  Current Outpatient Medications (Analgesics):    acetaminophen (TYLENOL) 325 MG tablet, Take 650 mg by mouth every 6 (six) hours as needed for mild pain, fever or headache.   meloxicam (MOBIC) 15 MG tablet, Take 1 tablet (15 mg total) by mouth daily.   Oxycodone HCl 10 MG TABS, Take 1-2 tablets (10-20 mg total) by mouth every 4 (four) hours as needed (pain).   Current Outpatient Medications (Other):    amoxicillin-clavulanate (AUGMENTIN) 875-125 MG tablet, Take 1 tablet by mouth 2 (two) times daily.    chlorhexidine (PERIDEX) 0.12 % solution, SMARTSIG:Capful(s) By Mouth   DULoxetine (CYMBALTA) 60 MG capsule, Take 60 mg by mouth at bedtime.   gabapentin (NEURONTIN) 300 MG capsule, Take 1 capsule (300 mg total) by mouth 3 (three) times daily.   nicotine (NICODERM CQ - DOSED IN MG/24 HOURS) 14 mg/24hr patch, Place 1 patch (14 mg total) onto the skin daily.   sodium chloride flush (NS) 0.9 % SOLN,  10-40 mLs by Intracatheter route as needed (flush).   thiamine 100 MG tablet, Take 1 tablet (100 mg total) by mouth daily.   tiZANidine (ZANAFLEX) 4 MG tablet, Take 4 mg by mouth 3 (three) times daily.    Reviewed prior external information including notes and imaging from  primary care provider As well as notes that were available from care everywhere and other healthcare systems.  Past medical history, social, surgical and family history all reviewed in electronic medical record.  No pertanent information unless stated regarding to the chief complaint.   Review of Systems:  No headache, visual changes, nausea, vomiting, diarrhea, constipation, dizziness, abdominal pain, skin rash, fevers, chills, night sweats, weight loss, swollen lymph nodes, body aches, joint swelling, chest pain, shortness of breath, mood changes. POSITIVE muscle aches  Objective  There were no vitals taken for this visit.   General: No apparent distress alert and oriented x3 mood and affect normal, dressed appropriately.  HEENT: Pupils equal, extraocular movements intact  Respiratory: Patient's speak in full sentences and does not appear short of breath  Cardiovascular: No lower extremity edema, non tender, no erythema  Gait normal with good balance and coordination.  MSK:  Non tender with full range of motion and good stability and symmetric strength and tone of shoulders, elbows, wrist, hip, knee and ankles bilaterally.     Impression and Recommendations:     The above documentation has been reviewed and is accurate  and complete Jacqualin Combes

## 2021-05-05 ENCOUNTER — Ambulatory Visit: Payer: Medicare Other | Admitting: Family Medicine

## 2021-06-11 DIAGNOSIS — M5412 Radiculopathy, cervical region: Secondary | ICD-10-CM | POA: Diagnosis not present

## 2021-06-11 DIAGNOSIS — Z9889 Other specified postprocedural states: Secondary | ICD-10-CM | POA: Diagnosis not present

## 2021-06-11 DIAGNOSIS — M5416 Radiculopathy, lumbar region: Secondary | ICD-10-CM | POA: Diagnosis not present

## 2021-06-15 NOTE — Progress Notes (Signed)
Subjective:  Chief complaint: Still occasional residual tenderness in his jaw    Patient ID: [name], male    DOB: 1961-02-06, 61 y.o.   MRN: [id number]  HPI  61 year old with hx of history of lumbar surgery COPD smoking who had a root canal that then became infected odontogenic infection which spread with osteomyelitis of the jaw and an abscess in the muscles of mastication status post IR guided drainage of abscess with culture sent which yielded Streptococcus Intermediums which was PCN Sensitive.   We saw him in the hospital and set him up with IV ertapenem which he is continued on.  He has seen an oral maxillofacial surgeon who apparently saw some bone protruding in his jaw that was not covered with tissue and he debrided this.  I was  concerned that he still may have a tooth that needs to be removed that is the source of his mandibular osteomyelitis.  He had had a root canal performed before the osteomyelitis set in the OR on CT maxillofacial there was no abnormalities immediately around the root canal.  His dentist had called me and was not wanting to remove the tooth where the root canal was performed because of there being no overt infection there and also no evidence on CT scan of that site actually being infected is quite curious in the sense that 1 would expect that a tooth would be involved near an area of mandibular osteomyelitis.  Patient was going to have the tooth removed today actually but I then again reviewed my discussion with his dentist.    The last visit he was still bothered though by how his teeth feel at times when he closes his teeth both back where the root canal is and also when he closes the teeth in the front of his mouth he has a sensation towards the back.  He also had a sensation of fullness in his ear when he tilts his head backwards.  At last visit he continued to have tenderness along the mandible and persistent fullness in the ear particularly  when again he tilts his head backwards.  I ordered CT MF with contrast in early December of 2022 which shows now NO evidence of osteomyelitis on scan.  He is remained on Augmentin by think we can now stop that.      Past Medical History:  Diagnosis Date   Allergy    Anxiety    Arthritis    Back   Chronic back pain    Colon polyps    COPD (chronic obstructive pulmonary disease) (HCC)    Depression    GERD (gastroesophageal reflux disease)    every now and then   Headache    Hyperlipidemia 09/26/2015   Lumbar surgical wound fluid collection    lumbar wound dehiscence   Pneumonia 2020    Past Surgical History:  Procedure Laterality Date   BACK SURGERY     COLONOSCOPY     COLONOSCOPY W/ BIOPSIES AND POLYPECTOMY     EYE SURGERY     IR US GUIDE BX ASP/DRAIN  11/18/2020   KNEE ARTHROSCOPY WITH MEDIAL MENISECTOMY Left 01/23/2016   Procedure: KNEE ARTHROSCOPY WITH MEDIAL MENISECTOMY;  Surgeon: Melrose Nakayama, MD;  Location: [facility];  Service: Orthopedics;  Laterality: Left;   LUMBAR LAMINECTOMY/DECOMPRESSION MICRODISCECTOMY N/A 06/09/2020   Procedure: Decompressive laminectomy Lumbarone -two;  Surgeon: Eustace Moore, MD;  Location: [facility];  Service: Neurosurgery;  Laterality: N/A;  LUMBAR WOUND DEBRIDEMENT N/A 03/21/2014   Procedure: LUMBAR WOUND DEBRIDEMENT;  Surgeon: Eustace Moore, MD;  Location: [facility];  Service: Neurosurgery;  Laterality: N/A;   MAXIMUM ACCESS (MAS)POSTERIOR LUMBAR INTERBODY FUSION (PLIF) 1 LEVEL N/A 02/14/2014   Procedure: FOR MAXIMUM ACCESS SURGERY POSTERIOR LUMBAR INTERBODY FUSION LUMBAR TWO-THREE;  Surgeon: Eustace Moore, MD;  Location: [facility];  Service: Neurosurgery;  Laterality: N/A;   NECK SURGERY     SHOULDER SURGERY     SPINE SURGERY     TONSILLECTOMY      Family History  Problem Relation Age of Onset   Colon cancer Father 9       pt thinks father had colon cancer   Stomach cancer Father    Hypertension Mother     Colon polyps Brother    Aneurysm Maternal Aunt 66       brain   Heart disease Maternal Aunt        d 1   Stomach cancer Paternal Uncle 56   Aneurysm Maternal Grandfather        stomach   Colon cancer Maternal Grandfather        thinks grandfather had colon cancer   Stomach cancer Paternal Grandfather 56   Depression Brother 14   Suicidality Brother    Stomach cancer Paternal Uncle 61   Esophageal cancer Neg Hx    Rectal cancer Neg Hx    Diabetes Neg Hx       Social History   Socioeconomic History   Marital status: Married    Spouse name: cindy   Number of children: Not on file   Years of education: Not on file   Highest education level: Not on file  Occupational History   Occupation: produce manager/ disability    Employer: [employer]  Tobacco Use   Smoking status: Every Day    Packs/day: 0.50    Years: 44.00    Pack years: 22.00    Types: Cigarettes   Smokeless tobacco: Current    Types: Snuff  Vaping Use   Vaping Use: Never used  Substance and Sexual Activity   Alcohol use: Yes    Alcohol/week: 24.0 standard drinks    Types: 24 Standard drinks or equivalent per week    Comment: social beer   Drug use: No   Sexual activity: Yes    Partners: Female  Other Topics Concern   Not on file  Social History Narrative   Exercise--no   Social Determinants of Health   Financial Resource Strain: Low Risk    Difficulty of Paying Living Expenses: Not hard at all  Food Insecurity: No Food Insecurity   Worried About Charity fundraiser in the Last Year: Never true   Arboriculturist in the Last Year: Never true  Transportation Needs: No Transportation Needs   Lack of Transportation (Medical): No   Lack of Transportation (Non-Medical): No  Physical Activity: Inactive   Days of Exercise per Week: 0 days   Minutes of Exercise per Session: 0 min  Stress: No Stress Concern Present   Feeling of Stress : Not at all  Social Connections: Not on file    Allergies   Allergen Reactions   Cefuroxime Axetil Hives     Current Outpatient Medications:    acetaminophen (TYLENOL) 325 MG tablet, Take 650 mg by mouth every 6 (six) hours as needed for mild pain, fever or headache., Disp: , Rfl:    albuterol (VENTOLIN  HFA) 108 (90 Base) MCG/ACT inhaler, Inhale 1-2 puffs into the lungs every 6 (six) hours as needed for wheezing or shortness of breath., Disp: 8.5 each, Rfl: 11   amoxicillin-clavulanate (AUGMENTIN) 875-125 MG tablet, Take 1 tablet by mouth 2 (two) times daily., Disp: 60 tablet, Rfl: 5   atorvastatin (LIPITOR) 10 MG tablet, TAKE 1 TABLET BY MOUTH EVERY DAY, Disp: 90 tablet, Rfl: 3   cetirizine (ZYRTEC) 10 MG tablet, Take 1 tablet (10 mg total) by mouth daily., Disp: 30 tablet, Rfl: 11   chlorhexidine (PERIDEX) 0.12 % solution, SMARTSIG:Capful(s) By Mouth, Disp: , Rfl:    DULoxetine (CYMBALTA) 60 MG capsule, Take 60 mg by mouth at bedtime., Disp: , Rfl: 2   gabapentin (NEURONTIN) 300 MG capsule, Take 1 capsule (300 mg total) by mouth 3 (three) times daily., Disp: 90 capsule, Rfl: 3   guaiFENesin (MUCINEX) 600 MG 12 hr tablet, Take 2 tablets (1,200 mg total) by mouth 2 (two) times daily as needed., Disp: 60 tablet, Rfl: 5   meloxicam (MOBIC) 15 MG tablet, Take 1 tablet (15 mg total) by mouth daily., Disp: 90 tablet, Rfl: 3   nicotine (NICODERM CQ - DOSED IN MG/24 HOURS) 14 mg/24hr patch, Place 1 patch (14 mg total) onto the skin daily., Disp: 28 patch, Rfl: 0   Oxycodone HCl 10 MG TABS, Take 1-2 tablets (10-20 mg total) by mouth every 4 (four) hours as needed (pain)., Disp: 40 tablet, Rfl: 0   sodium chloride flush (NS) 0.9 % SOLN, 10-40 mLs by Intracatheter route as needed (flush)., Disp: , Rfl:    thiamine 100 MG tablet, Take 1 tablet (100 mg total) by mouth daily., Disp: 30 tablet, Rfl: 0   tiZANidine (ZANAFLEX) 4 MG tablet, Take 4 mg by mouth 3 (three) times daily. , Disp: , Rfl: 2   triamcinolone (NASACORT) 55 MCG/ACT AERO nasal inhaler, Place 2  sprays into the nose daily., Disp: 1 each, Rfl: 12   Review of Systems  Constitutional:  Negative for chills and fever.  HENT:  Negative for congestion and sore throat.   Eyes:  Negative for photophobia.  Respiratory:  Negative for cough, shortness of breath and wheezing.   Cardiovascular:  Negative for chest pain, palpitations and leg swelling.  Gastrointestinal:  Negative for abdominal pain, blood in stool, constipation, diarrhea, nausea and vomiting.  Genitourinary:  Negative for dysuria, flank pain and hematuria.  Musculoskeletal:  Negative for back pain and myalgias.  Skin:  Negative for rash.  Neurological:  Negative for dizziness, weakness and headaches.  Hematological:  Does not bruise/bleed easily.  Psychiatric/Behavioral:  Negative for suicidal ideas.       Objective:   Physical Exam Constitutional:      General: He is not in acute distress.    Appearance: Normal appearance. He is well-developed. He is not ill-appearing or diaphoretic.  HENT:     Head: Normocephalic and atraumatic.     Right Ear: Hearing and external ear normal.     Left Ear: Hearing and external ear normal.     Nose: No nasal deformity or rhinorrhea.  Eyes:     General: No scleral icterus.    Conjunctiva/sclera: Conjunctivae normal.     Right eye: Right conjunctiva is not injected.     Left eye: Left conjunctiva is not injected.     Pupils: Pupils are equal, round, and reactive to light.  Neck:     Vascular: No JVD.  Cardiovascular:     Rate and Rhythm: Normal  rate and regular rhythm.     Heart sounds: S1 normal and S2 normal.  Abdominal:     General: There is no distension.  Musculoskeletal:        General: Normal range of motion.     Right shoulder: Normal.     Left shoulder: Normal.     Cervical back: Normal range of motion and neck supple.     Right hip: Normal.     Left hip: Normal.     Right knee: Normal.     Left knee: Normal.  Lymphadenopathy:     Head:     Right side of head: No  submandibular, preauricular or posterior auricular adenopathy.     Left side of head: No submandibular, preauricular or posterior auricular adenopathy.     Cervical: No cervical adenopathy.     Right cervical: No superficial or deep cervical adenopathy.    Left cervical: No superficial or deep cervical adenopathy.  Skin:    General: Skin is warm and dry.     Coloration: Skin is not pale.     Findings: No abrasion, bruising, ecchymosis, erythema, lesion or rash.     Nails: There is no clubbing.  Neurological:     General: No focal deficit present.     Mental Status: He is alert and oriented to person, place, and time.     Sensory: No sensory deficit.     Coordination: Coordination normal.     Gait: Gait normal.  Psychiatric:        Attention and Perception: He is attentive.        Mood and Affect: Mood normal.        Speech: Speech normal.        Behavior: Behavior normal. Behavior is cooperative.        Thought Content: Thought content normal.        Judgment: Judgment normal.            Assessment & Plan:  Tibial osteomyelitis presumably of odontogenic source.  It is curious that there is no overt evidence of infection around the root canal was performed prior to developing the bone infection dentist did not believe that the site was infected clinically or on her scan is as well.  Indeed CT scan did not show that.  Repeat CT scan from December has been reviewed and shows no evidence of osteomyelitis and resolution of prior findings  Morning sed rate CRP CMP with GFR CBC with differential.  He will stop antibiotics and will be observed off antibiotics he can come back to clinic if he has worsening pain or symptoms concerning for recurrence of his infection.    There were no vitals filed for this visit.   Smoking encouraged to quit smoking

## 2021-06-16 ENCOUNTER — Other Ambulatory Visit: Payer: Self-pay

## 2021-06-16 ENCOUNTER — Encounter: Payer: Self-pay | Admitting: Infectious Disease

## 2021-06-16 ENCOUNTER — Ambulatory Visit: Payer: Medicare Other | Admitting: Infectious Disease

## 2021-06-16 VITALS — BP 133/92 | HR 92 | Temp 98.0°F | Resp 16 | Ht 71.0 in | Wt 256.0 lb

## 2021-06-16 DIAGNOSIS — F17200 Nicotine dependence, unspecified, uncomplicated: Secondary | ICD-10-CM | POA: Diagnosis not present

## 2021-06-16 DIAGNOSIS — M272 Inflammatory conditions of jaws: Secondary | ICD-10-CM | POA: Diagnosis not present

## 2021-06-17 LAB — SEDIMENTATION RATE: Sed Rate: 9 mm/h (ref 0–20)

## 2021-06-17 LAB — CBC WITH DIFFERENTIAL/PLATELET
Absolute Monocytes: 864 cells/uL (ref 200–950)
Basophils Absolute: 127 cells/uL (ref 0–200)
Basophils Relative: 1 %
Eosinophils Absolute: 114 cells/uL (ref 15–500)
Eosinophils Relative: 0.9 %
HCT: 47 % (ref 38.5–50.0)
Hemoglobin: 15.7 g/dL (ref 13.2–17.1)
Lymphs Abs: 2680 cells/uL (ref 850–3900)
MCH: 31.3 pg (ref 27.0–33.0)
MCHC: 33.4 g/dL (ref 32.0–36.0)
MCV: 93.6 fL (ref 80.0–100.0)
MPV: 9.4 fL (ref 7.5–12.5)
Monocytes Relative: 6.8 %
Neutro Abs: 8915 cells/uL — ABNORMAL HIGH (ref 1500–7800)
Neutrophils Relative %: 70.2 %
Platelets: 313 10*3/uL (ref 140–400)
RBC: 5.02 10*6/uL (ref 4.20–5.80)
RDW: 13.2 % (ref 11.0–15.0)
Total Lymphocyte: 21.1 %
WBC: 12.7 10*3/uL — ABNORMAL HIGH (ref 3.8–10.8)

## 2021-06-17 LAB — BASIC METABOLIC PANEL WITH GFR
BUN: 21 mg/dL (ref 7–25)
CO2: 28 mmol/L (ref 20–32)
Calcium: 9.9 mg/dL (ref 8.6–10.3)
Chloride: 103 mmol/L (ref 98–110)
Creat: 0.81 mg/dL (ref 0.70–1.30)
Glucose, Bld: 107 mg/dL — ABNORMAL HIGH (ref 65–99)
Potassium: 5.3 mmol/L (ref 3.5–5.3)
Sodium: 138 mmol/L (ref 135–146)
eGFR: 102 mL/min/{1.73_m2} (ref >=60)

## 2021-06-17 LAB — C-REACTIVE PROTEIN: CRP: 3.9 mg/L (ref <8.0)

## 2021-06-18 DIAGNOSIS — Z9889 Other specified postprocedural states: Secondary | ICD-10-CM | POA: Diagnosis not present

## 2021-06-18 DIAGNOSIS — M5416 Radiculopathy, lumbar region: Secondary | ICD-10-CM | POA: Diagnosis not present

## 2021-06-19 ENCOUNTER — Other Ambulatory Visit: Payer: Self-pay | Admitting: Neurosurgery

## 2021-06-19 DIAGNOSIS — Z9889 Other specified postprocedural states: Secondary | ICD-10-CM

## 2021-06-23 ENCOUNTER — Other Ambulatory Visit: Payer: Self-pay | Admitting: Infectious Disease

## 2021-07-07 ENCOUNTER — Other Ambulatory Visit: Payer: Self-pay

## 2021-07-07 ENCOUNTER — Ambulatory Visit
Admission: RE | Admit: 2021-07-07 | Discharge: 2021-07-07 | Disposition: A | Discharge status: Home / Self Care | Payer: Medicare Other | Source: Ambulatory Visit | Attending: Neurosurgery | Admitting: Neurosurgery

## 2021-07-07 DIAGNOSIS — M5134 Other intervertebral disc degeneration, thoracic region: Secondary | ICD-10-CM | POA: Diagnosis not present

## 2021-07-07 DIAGNOSIS — Z9889 Other specified postprocedural states: Secondary | ICD-10-CM

## 2021-07-09 DIAGNOSIS — R030 Elevated blood-pressure reading, without diagnosis of hypertension: Secondary | ICD-10-CM | POA: Diagnosis not present

## 2021-07-09 DIAGNOSIS — M961 Postlaminectomy syndrome, not elsewhere classified: Secondary | ICD-10-CM | POA: Diagnosis not present

## 2021-07-14 DIAGNOSIS — M7061 Trochanteric bursitis, right hip: Secondary | ICD-10-CM | POA: Diagnosis not present

## 2021-07-14 DIAGNOSIS — M5416 Radiculopathy, lumbar region: Secondary | ICD-10-CM | POA: Diagnosis not present

## 2021-07-14 DIAGNOSIS — M961 Postlaminectomy syndrome, not elsewhere classified: Secondary | ICD-10-CM | POA: Diagnosis not present

## 2021-07-31 DIAGNOSIS — M5380 Other specified dorsopathies, site unspecified: Secondary | ICD-10-CM | POA: Diagnosis not present

## 2021-07-31 DIAGNOSIS — M961 Postlaminectomy syndrome, not elsewhere classified: Secondary | ICD-10-CM | POA: Diagnosis not present

## 2021-07-31 DIAGNOSIS — Z9889 Other specified postprocedural states: Secondary | ICD-10-CM | POA: Diagnosis not present

## 2021-08-03 ENCOUNTER — Telehealth: Payer: Self-pay | Admitting: Internal Medicine

## 2021-08-03 NOTE — Telephone Encounter (Signed)
Left message for patient to call back to schedule Medicare Annual Wellness Visit  ? ?Last AWV  07/24/20 ? ?Please schedule at anytime with [facility] if patient calls the office back.   ? ? ?Any questions, please call me at [phone]  ?

## 2021-09-03 NOTE — Progress Notes (Signed)
?[name] D.O. ?[facility] ?[place] ?Phone: [phone] ?Subjective:   ?I, [name], am serving as a Education administrator for Dr. Hulan Saas. ?This visit occurred during the SARS-CoV-2 public health emergency.  Safety protocols were in place, including screening questions prior to the visit, additional usage of staff PPE, and extensive cleaning of exam room while observing appropriate contact time as indicated for disinfecting solutions.  ? ?I'm seeing this patient by the request  of:  [name], MD ? ?CC: Bilateral knee pain ? ?[id number]  ?03/24/2021 ?Patient does have more muscle tightness noted.  Given a Toradol and Depo-Medrol today to help with some of the discomfort and pain.  Patient is seeing a neurosurgeon to discuss the possibility of a stimulator.  Continue the surgical medicine for this chronic problem with exacerbation including the Cymbalta and the gabapentin.  Patient is to hold his meloxicam for the next 36 hours and then can restart.  Patient does have pain medications he can use as well and does have the muscle relaxer.  Follow-up with me again in 2 to 3 months otherwise.  Patient knows if any worsening symptoms to seek medical attention immediately ? ?Updated 09/07/2021 ?[name] is a 61 y.o. male coming in with complaint of bilateral knee pain. Knees in constant ache and feel like they want to give out all the time. Both knees not feeling great at the moment. Had back surgery about a month ago. Input of spine stimulator. Having toe pain both feet. No other complaints. ? ? ? ?  ? ?Past Medical History:  ?Diagnosis Date  ? Allergy   ? Anxiety   ? Arthritis   ? Back  ? Chronic back pain   ? Colon polyps   ? COPD (chronic obstructive pulmonary disease) ([place])   ? Depression   ? GERD (gastroesophageal reflux disease)   ? every now and then  ? Headache   ? Hyperlipidemia 09/26/2015  ? Lumbar surgical wound fluid collection   ? lumbar wound  dehiscence  ? Pneumonia 2020  ? ?Past Surgical History:  ?Procedure Laterality Date  ? BACK SURGERY    ? COLONOSCOPY    ? COLONOSCOPY W/ BIOPSIES AND POLYPECTOMY    ? EYE SURGERY    ? IR US GUIDE BX ASP/DRAIN  11/18/2020  ? KNEE ARTHROSCOPY WITH MEDIAL MENISECTOMY Left 01/23/2016  ? Procedure: KNEE ARTHROSCOPY WITH MEDIAL MENISECTOMY;  Surgeon: Melrose Nakayama, MD;  Location: [facility];  Service: Orthopedics;  Laterality: Left;  ? LUMBAR LAMINECTOMY/DECOMPRESSION MICRODISCECTOMY N/A 06/09/2020  ? Procedure: Decompressive laminectomy Lumbarone -two;  Surgeon: Eustace Moore, MD;  Location: [facility];  Service: Neurosurgery;  Laterality: N/A;  ? LUMBAR WOUND DEBRIDEMENT N/A 03/21/2014  ? Procedure: LUMBAR WOUND DEBRIDEMENT;  Surgeon: Eustace Moore, MD;  Location: [place] [facility];  Service: Neurosurgery;  Laterality: N/A;  ? MAXIMUM ACCESS (MAS)POSTERIOR LUMBAR INTERBODY FUSION (PLIF) 1 LEVEL N/A 02/14/2014  ? Procedure: FOR MAXIMUM ACCESS SURGERY POSTERIOR LUMBAR INTERBODY FUSION LUMBAR TWO-THREE;  Surgeon: Eustace Moore, MD;  Location: [place] [facility];  Service: Neurosurgery;  Laterality: N/A;  ? NECK SURGERY    ? SHOULDER SURGERY    ? SPINE SURGERY    ? TONSILLECTOMY    ? ?Social History  ? ?Socioeconomic History  ? Marital status: Married  ?  Spouse name: cindy  ? Number of children: Not on file  ? Years of education: Not on file  ? Highest  education level: Not on file  ?Occupational History  ? Occupation: produce manager/ disability  ?  Employer: [employer]  ?Tobacco Use  ? Smoking status: Every Day  ?  Packs/day: 0.50  ?  Years: 44.00  ?  Pack years: 22.00  ?  Types: Cigarettes  ? Smokeless tobacco: Former  ?  Types: Snuff  ?Vaping Use  ? Vaping Use: Never used  ?Substance and Sexual Activity  ? Alcohol use: Yes  ?  Alcohol/week: 24.0 standard drinks  ?  Types: 24 Standard drinks or equivalent per week  ?  Comment: social beer  ? Drug use: No  ? Sexual activity: Yes  ?  Partners: Female  ?Other Topics Concern   ? Not on file  ?Social History Narrative  ? Exercise--no  ? ?Social Determinants of Health  ? ?Financial Resource Strain: Not on file  ?Food Insecurity: Not on file  ?Transportation Needs: Not on file  ?Physical Activity: Not on file  ?Stress: Not on file  ?Social Connections: Not on file  ? ?Allergies  ?Allergen Reactions  ? Cefuroxime Axetil Hives  ? ?Family History  ?Problem Relation Age of Onset  ? Colon cancer Father 69  ?     pt thinks father had colon cancer  ? Stomach cancer Father   ? Hypertension Mother   ? Colon polyps Brother   ? Aneurysm Maternal Aunt 58  ?     brain  ? Heart disease Maternal Aunt   ?     d 79  ? Stomach cancer Paternal Uncle 39  ? Aneurysm Maternal Grandfather   ?     stomach  ? Colon cancer Maternal Grandfather   ?     thinks grandfather had colon cancer  ? Stomach cancer Paternal Grandfather 50  ? Depression Brother 20  ? Suicidality Brother   ? Stomach cancer Paternal Uncle 35  ? Esophageal cancer Neg Hx   ? Rectal cancer Neg Hx   ? Diabetes Neg Hx   ? ? ? ?Current Outpatient Medications (Cardiovascular):  ?  atorvastatin (LIPITOR) 10 MG tablet, TAKE 1 TABLET BY MOUTH EVERY DAY ? ?Current Outpatient Medications (Respiratory):  ?  albuterol (VENTOLIN HFA) 108 (90 Base) MCG/ACT inhaler, Inhale 1-2 puffs into the lungs every 6 (six) hours as needed for wheezing or shortness of breath. ?  cetirizine (ZYRTEC) 10 MG tablet, Take 1 tablet (10 mg total) by mouth daily. (Patient not taking: Reported on 06/16/2021) ?  guaiFENesin (MUCINEX) 600 MG 12 hr tablet, Take 2 tablets (1,200 mg total) by mouth 2 (two) times daily as needed. ?  triamcinolone (NASACORT) 55 MCG/ACT AERO nasal inhaler, Place 2 sprays into the nose daily. (Patient not taking: Reported on 06/16/2021) ? ?Current Outpatient Medications (Analgesics):  ?  acetaminophen (TYLENOL) 325 MG tablet, Take 650 mg by mouth every 6 (six) hours as needed for mild pain, fever or headache. ?  meloxicam (MOBIC) 15 MG tablet, Take 1 tablet (15  mg total) by mouth daily. ?  Oxycodone HCl 10 MG TABS, Take 1-2 tablets (10-20 mg total) by mouth every 4 (four) hours as needed (pain). ? ? ?Current Outpatient Medications (Other):  ?  chlorhexidine (PERIDEX) 0.12 % solution, SMARTSIG:Capful(s) By Mouth (Patient not taking: Reported on 06/16/2021) ?  DULoxetine (CYMBALTA) 60 MG capsule, Take 60 mg by mouth at bedtime. ?  gabapentin (NEURONTIN) 300 MG capsule, Take 1 capsule (300 mg total) by mouth 3 (three) times daily. ?  nicotine (NICODERM CQ -  DOSED IN MG/24 HOURS) 14 mg/24hr patch, Place 1 patch (14 mg total) onto the skin daily. (Patient not taking: Reported on 06/16/2021) ?  sodium chloride flush (NS) 0.9 % SOLN, 10-40 mLs by Intracatheter route as needed (flush). (Patient not taking: Reported on 06/16/2021) ?  thiamine 100 MG tablet, Take 1 tablet (100 mg total) by mouth daily. ?  tiZANidine (ZANAFLEX) 4 MG tablet, Take 4 mg by mouth 3 (three) times daily.  ? ? ?Reviewed prior external information including notes and imaging from  ?primary care provider ?As well as notes that were available from care everywhere and other healthcare systems. ? ?Past medical history, social, surgical and family history all reviewed in electronic medical record.  No pertanent information unless stated regarding to the chief complaint.  ? ?Review of Systems: ? No headache, visual changes, nausea, vomiting, diarrhea, constipation, dizziness, abdominal pain, skin rash, fevers, chills, night sweats, weight loss, swollen lymph nodes, body aches, joint swelling, chest pain, shortness of breath, mood changes. POSITIVE muscle aches ? ?Objective  ?Blood pressure (!) 148/90, pulse (!) 102, height '5\' 11"'$  (1.803 m), weight 259 lb (117.5 kg), SpO2 96 %. ?  ?General: No apparent distress alert and oriented x3 mood and affect normal, dressed appropriately.  ?HEENT: Pupils equal, extraocular movements intact  ?Respiratory: Patient's speak in full sentences and does not appear short of breath   ?Cardiovascular: No lower extremity edema, non tender, no erythema  ?Gait antalgic gait noted.  Patient still has arthritic changes of multiple joints.  Knees do have some crepitus with range of motion.  Patien

## 2021-09-07 ENCOUNTER — Ambulatory Visit (INDEPENDENT_AMBULATORY_CARE_PROVIDER_SITE_OTHER): Payer: Medicare Other

## 2021-09-07 ENCOUNTER — Ambulatory Visit: Payer: Medicare Other | Admitting: Family Medicine

## 2021-09-07 VITALS — BP 148/90 | HR 102 | Ht 71.0 in | Wt 259.0 lb

## 2021-09-07 DIAGNOSIS — M25562 Pain in left knee: Secondary | ICD-10-CM | POA: Diagnosis not present

## 2021-09-07 DIAGNOSIS — G8929 Other chronic pain: Secondary | ICD-10-CM | POA: Diagnosis not present

## 2021-09-07 DIAGNOSIS — M25561 Pain in right knee: Secondary | ICD-10-CM

## 2021-09-07 DIAGNOSIS — M170 Bilateral primary osteoarthritis of knee: Secondary | ICD-10-CM | POA: Diagnosis not present

## 2021-09-07 NOTE — Patient Instructions (Addendum)
Xrays today ?Knee injections today ?Good to see you! ?Hopefully injections will help ?See you again in 6-8 weeks ?

## 2021-09-07 NOTE — Assessment & Plan Note (Signed)
Patient does have some arthritic changes of the knees bilaterally.  Has had difficulty with multiple joints.  The differential also includes some lumbar radiculopathy and patient is working on getting home exercises done on a more regular basis.  Discussed icing regimen.  Follow-up again 6 to 8 weeks.  Could be candidate for viscosupplementation. ?

## 2021-09-10 DIAGNOSIS — M961 Postlaminectomy syndrome, not elsewhere classified: Secondary | ICD-10-CM | POA: Diagnosis not present

## 2021-09-22 DIAGNOSIS — M961 Postlaminectomy syndrome, not elsewhere classified: Secondary | ICD-10-CM | POA: Diagnosis not present

## 2021-09-22 DIAGNOSIS — M7061 Trochanteric bursitis, right hip: Secondary | ICD-10-CM | POA: Diagnosis not present

## 2021-09-22 DIAGNOSIS — Z9689 Presence of other specified functional implants: Secondary | ICD-10-CM | POA: Diagnosis not present

## 2021-09-24 DIAGNOSIS — T148XXD Other injury of unspecified body region, subsequent encounter: Secondary | ICD-10-CM | POA: Diagnosis not present

## 2021-09-25 ENCOUNTER — Other Ambulatory Visit: Payer: Self-pay | Admitting: Internal Medicine

## 2021-09-25 DIAGNOSIS — F17200 Nicotine dependence, unspecified, uncomplicated: Secondary | ICD-10-CM

## 2021-09-25 DIAGNOSIS — I739 Peripheral vascular disease, unspecified: Secondary | ICD-10-CM

## 2021-10-02 DIAGNOSIS — T85734A Infection and inflammatory reaction due to implanted electronic neurostimulator, generator, initial encounter: Secondary | ICD-10-CM | POA: Diagnosis not present

## 2021-10-02 DIAGNOSIS — Z4542 Encounter for adjustment and management of neuropacemaker (brain) (peripheral nerve) (spinal cord): Secondary | ICD-10-CM | POA: Diagnosis not present

## 2021-10-02 DIAGNOSIS — T8131XA Disruption of external operation (surgical) wound, not elsewhere classified, initial encounter: Secondary | ICD-10-CM | POA: Diagnosis not present

## 2021-10-02 DIAGNOSIS — T8189XA Other complications of procedures, not elsewhere classified, initial encounter: Secondary | ICD-10-CM | POA: Diagnosis not present

## 2021-10-15 ENCOUNTER — Ambulatory Visit (HOSPITAL_COMMUNITY)
Admission: RE | Admit: 2021-10-15 | Discharge: 2021-10-15 | Disposition: A | Discharge status: Home / Self Care | Payer: Medicare Other | Source: Ambulatory Visit | Attending: Internal Medicine | Admitting: Internal Medicine

## 2021-10-15 DIAGNOSIS — F17200 Nicotine dependence, unspecified, uncomplicated: Secondary | ICD-10-CM

## 2021-10-15 DIAGNOSIS — I739 Peripheral vascular disease, unspecified: Secondary | ICD-10-CM

## 2021-10-15 NOTE — Progress Notes (Deleted)
[place] Phone: [phone] Subjective:    I'm seeing this patient by the request  of:  [name], MD  CC:   [id number]  09/07/2021 Patient does have some arthritic changes of the knees bilaterally.  Has had difficulty with multiple joints.  The differential also includes some lumbar radiculopathy and patient is working on getting home exercises done on a more regular basis.  Discussed icing regimen.  Follow-up again 6 to 8 weeks.  Could be candidate for viscosupplementation.  Update 10/19/2021 [name] is a 61 y.o. male coming in with complaint of B knee pain. Patient approved for Durolane. Patient states     Past Medical History:  Diagnosis Date   Allergy    Anxiety    Arthritis    Back   Chronic back pain    Colon polyps    COPD (chronic obstructive pulmonary disease) ([facility])    Depression    GERD (gastroesophageal reflux disease)    every now and then   Headache    Hyperlipidemia 09/26/2015   Lumbar surgical wound fluid collection    lumbar wound dehiscence   Pneumonia 2020   Past Surgical History:  Procedure Laterality Date   BACK SURGERY     COLONOSCOPY     COLONOSCOPY W/ BIOPSIES AND POLYPECTOMY     EYE SURGERY     IR US GUIDE BX ASP/DRAIN  11/18/2020   KNEE ARTHROSCOPY WITH MEDIAL MENISECTOMY Left 01/23/2016   Procedure: KNEE ARTHROSCOPY WITH MEDIAL MENISECTOMY;  Surgeon: Melrose Nakayama, MD;  Location: [facility];  Service: Orthopedics;  Laterality: Left;   LUMBAR LAMINECTOMY/DECOMPRESSION MICRODISCECTOMY N/A 06/09/2020   Procedure: Decompressive laminectomy Lumbarone -two;  Surgeon: Eustace Moore, MD;  Location: [facility];  Service: Neurosurgery;  Laterality: N/A;   LUMBAR WOUND DEBRIDEMENT N/A 03/21/2014   Procedure: LUMBAR WOUND DEBRIDEMENT;  Surgeon: Eustace Moore, MD;  Location: [place] [facility];  Service: Neurosurgery;  Laterality: N/A;   MAXIMUM ACCESS  (MAS)POSTERIOR LUMBAR INTERBODY FUSION (PLIF) 1 LEVEL N/A 02/14/2014   Procedure: FOR MAXIMUM ACCESS SURGERY POSTERIOR LUMBAR INTERBODY FUSION LUMBAR TWO-THREE;  Surgeon: Eustace Moore, MD;  Location: [facility] [facility];  Service: Neurosurgery;  Laterality: N/A;   NECK SURGERY     SHOULDER SURGERY     SPINE SURGERY     TONSILLECTOMY     Social History   Socioeconomic History   Marital status: Married    Spouse name: cindy   Number of children: Not on file   Years of education: Not on file   Highest education level: Not on file  Occupational History   Occupation: produce manager/ disability    Employer: [employer]  Tobacco Use   Smoking status: Every Day    Packs/day: 0.50    Years: 44.00    Total pack years: 22.00    Types: Cigarettes   Smokeless tobacco: Former    Types: Snuff  Vaping Use   Vaping Use: Never used  Substance and Sexual Activity   Alcohol use: Yes    Alcohol/week: 24.0 standard drinks of alcohol    Types: 24 Standard drinks or equivalent per week    Comment: social beer   Drug use: No   Sexual activity: Yes    Partners: Female  Other Topics Concern   Not on file  Social History Narrative   Exercise--no   Social Determinants of Health   Financial Resource Strain:  Low Risk  (07/24/2020)   Overall Financial Resource Strain (CARDIA)    Difficulty of Paying Living Expenses: Not hard at all  Food Insecurity: No Food Insecurity (07/24/2020)   Hunger Vital Sign    Worried About Running Out of Food in the Last Year: Never true    Ran Out of Food in the Last Year: Never true  Transportation Needs: No Transportation Needs (07/24/2020)   PRAPARE - Hydrologist (Medical): No    Lack of Transportation (Non-Medical): No  Physical Activity: Inactive (07/24/2020)   Exercise Vital Sign    Days of Exercise per Week: 0 days    Minutes of Exercise per Session: 0 min  Stress: No Stress Concern Present (07/24/2020)   [place]    Feeling of Stress : Not at all  Social Connections: Unknown (09/27/2018)   Social Connection and Isolation Panel [NHANES]    Frequency of Communication with Friends and Family: Not on file    Frequency of Social Gatherings with Friends and Family: Not on file    Attends Religious Services: Not on file    Active Member of Clubs or Organizations: Not on file    Attends Archivist Meetings: Not on file    Marital Status: Married   Allergies  Allergen Reactions   Cefuroxime Axetil Hives   Family History  Problem Relation Age of Onset   Colon cancer Father 78       pt thinks father had colon cancer   Stomach cancer Father    Hypertension Mother    Colon polyps Brother    Aneurysm Maternal Aunt 49       brain   Heart disease Maternal Aunt        d 42   Stomach cancer Paternal Uncle 57   Aneurysm Maternal Grandfather        stomach   Colon cancer Maternal Grandfather        thinks grandfather had colon cancer   Stomach cancer Paternal Grandfather 54   Depression Brother 67   Suicidality Brother    Stomach cancer Paternal Uncle 73   Esophageal cancer Neg Hx    Rectal cancer Neg Hx    Diabetes Neg Hx      Current Outpatient Medications (Cardiovascular):    atorvastatin (LIPITOR) 10 MG tablet, TAKE 1 TABLET BY MOUTH EVERY DAY  Current Outpatient Medications (Respiratory):    albuterol (VENTOLIN HFA) 108 (90 Base) MCG/ACT inhaler, Inhale 1-2 puffs into the lungs every 6 (six) hours as needed for wheezing or shortness of breath.   cetirizine (ZYRTEC) 10 MG tablet, Take 1 tablet (10 mg total) by mouth daily. (Patient not taking: Reported on 06/16/2021)   guaiFENesin (MUCINEX) 600 MG 12 hr tablet, Take 2 tablets (1,200 mg total) by mouth 2 (two) times daily as needed.   triamcinolone (NASACORT) 55 MCG/ACT AERO nasal inhaler, Place 2 sprays into the nose daily. (Patient not taking: Reported on 06/16/2021)  Current  Outpatient Medications (Analgesics):    acetaminophen (TYLENOL) 325 MG tablet, Take 650 mg by mouth every 6 (six) hours as needed for mild pain, fever or headache.   meloxicam (MOBIC) 15 MG tablet, Take 1 tablet (15 mg total) by mouth daily.   Oxycodone HCl 10 MG TABS, Take 1-2 tablets (10-20 mg total) by mouth every 4 (four) hours as needed (pain).   Current Outpatient Medications (Other):    chlorhexidine (PERIDEX) 0.12 %  solution, SMARTSIG:Capful(s) By Mouth (Patient not taking: Reported on 06/16/2021)   DULoxetine (CYMBALTA) 60 MG capsule, Take 60 mg by mouth at bedtime.   gabapentin (NEURONTIN) 300 MG capsule, Take 1 capsule (300 mg total) by mouth 3 (three) times daily.   nicotine (NICODERM CQ - DOSED IN MG/24 HOURS) 14 mg/24hr patch, Place 1 patch (14 mg total) onto the skin daily. (Patient not taking: Reported on 06/16/2021)   sodium chloride flush (NS) 0.9 % SOLN, 10-40 mLs by Intracatheter route as needed (flush). (Patient not taking: Reported on 06/16/2021)   thiamine 100 MG tablet, Take 1 tablet (100 mg total) by mouth daily.   tiZANidine (ZANAFLEX) 4 MG tablet, Take 4 mg by mouth 3 (three) times daily.    Reviewed prior external information including notes and imaging from  primary care provider As well as notes that were available from care everywhere and other healthcare systems.  Past medical history, social, surgical and family history all reviewed in electronic medical record.  No pertanent information unless stated regarding to the chief complaint.   Review of Systems:  No headache, visual changes, nausea, vomiting, diarrhea, constipation, dizziness, abdominal pain, skin rash, fevers, chills, night sweats, weight loss, swollen lymph nodes, body aches, joint swelling, chest pain, shortness of breath, mood changes. POSITIVE muscle aches  Objective  There were no vitals taken for this visit.   General: No apparent distress alert and oriented x3 mood and affect normal, dressed  appropriately.  HEENT: Pupils equal, extraocular movements intact  Respiratory: Patient's speak in full sentences and does not appear short of breath  Cardiovascular: No lower extremity edema, non tender, no erythema      Impression and Recommendations:

## 2021-10-19 ENCOUNTER — Ambulatory Visit: Payer: Medicare Other | Admitting: Family Medicine

## 2021-10-28 ENCOUNTER — Ambulatory Visit: Payer: Medicare Other | Admitting: Plastic Surgery

## 2021-10-28 VITALS — BP 128/87 | HR 98 | Ht 71.0 in | Wt 254.4 lb

## 2021-10-28 DIAGNOSIS — T8579XA Infection and inflammatory reaction due to other internal prosthetic devices, implants and grafts, initial encounter: Secondary | ICD-10-CM

## 2021-10-28 DIAGNOSIS — S21201A Unspecified open wound of right back wall of thorax without penetration into thoracic cavity, initial encounter: Secondary | ICD-10-CM | POA: Diagnosis not present

## 2021-10-28 NOTE — Progress Notes (Signed)
Referring Provider [name], MD [address] [place],  [place] [address]   CC:  Chief Complaint  Patient presents with   Consult      [name] is an 61 y.o. male.  HPI: Patient presents to discuss a right lower back wound.  He had a spinal stimulator placed back in March.  It sounds like this initially healed up okay but then he developed some superficial wound healing problems.  He has been having some intermittent drainage that has gotten much better.  In fact today he had no drainage on the dressing.  He was prescribed a course of antibiotics which she has finished.  He feels like things are heading in the right direction but he was sent here for me to evaluate his wound.  Allergies  Allergen Reactions   Cefuroxime Axetil Hives    Outpatient Encounter Medications as of 10/28/2021  Medication Sig   acetaminophen (TYLENOL) 325 MG tablet Take 650 mg by mouth every 6 (six) hours as needed for mild pain, fever or headache.   albuterol (VENTOLIN HFA) 108 (90 Base) MCG/ACT inhaler Inhale 1-2 puffs into the lungs every 6 (six) hours as needed for wheezing or shortness of breath.   atorvastatin (LIPITOR) 10 MG tablet TAKE 1 TABLET BY MOUTH EVERY DAY   cetirizine (ZYRTEC) 10 MG tablet Take 1 tablet (10 mg total) by mouth daily.   chlorhexidine (PERIDEX) 0.12 % solution    DULoxetine (CYMBALTA) 60 MG capsule Take 60 mg by mouth at bedtime.   gabapentin (NEURONTIN) 300 MG capsule Take 1 capsule (300 mg total) by mouth 3 (three) times daily.   guaiFENesin (MUCINEX) 600 MG 12 hr tablet Take 2 tablets (1,200 mg total) by mouth 2 (two) times daily as needed.   meloxicam (MOBIC) 15 MG tablet Take 1 tablet (15 mg total) by mouth daily.   nicotine (NICODERM CQ - DOSED IN MG/24 HOURS) 14 mg/24hr patch Place 1 patch (14 mg total) onto the skin daily.   Oxycodone HCl 10 MG TABS Take 1-2 tablets (10-20 mg total) by mouth every 4 (four) hours as needed (pain).   sodium chloride flush  (NS) 0.9 % SOLN 10-40 mLs by Intracatheter route as needed (flush).   thiamine 100 MG tablet Take 1 tablet (100 mg total) by mouth daily.   tiZANidine (ZANAFLEX) 4 MG tablet Take 4 mg by mouth 3 (three) times daily.    triamcinolone (NASACORT) 55 MCG/ACT AERO nasal inhaler Place 2 sprays into the nose daily.   No facility-administered encounter medications on file as of 10/28/2021.     Past Medical History:  Diagnosis Date   Allergy    Anxiety    Arthritis    Back   Chronic back pain    Colon polyps    COPD (chronic obstructive pulmonary disease) ([facility])    Depression    GERD (gastroesophageal reflux disease)    every now and then   Headache    Hyperlipidemia 09/26/2015   Lumbar surgical wound fluid collection    lumbar wound dehiscence   Pneumonia 2020    Past Surgical History:  Procedure Laterality Date   BACK SURGERY     COLONOSCOPY     COLONOSCOPY W/ BIOPSIES AND POLYPECTOMY     EYE SURGERY     IR US GUIDE BX ASP/DRAIN  11/18/2020   KNEE ARTHROSCOPY WITH MEDIAL MENISECTOMY Left 01/23/2016   Procedure: KNEE ARTHROSCOPY WITH MEDIAL MENISECTOMY;  Surgeon: Melrose Nakayama, MD;  Location:  [facility];  Service: Orthopedics;  Laterality: Left;   LUMBAR LAMINECTOMY/DECOMPRESSION MICRODISCECTOMY N/A 06/09/2020   Procedure: Decompressive laminectomy Lumbarone -two;  Surgeon: Eustace Moore, MD;  Location: [facility];  Service: Neurosurgery;  Laterality: N/A;   LUMBAR WOUND DEBRIDEMENT N/A 03/21/2014   Procedure: LUMBAR WOUND DEBRIDEMENT;  Surgeon: Eustace Moore, MD;  Location: [facility];  Service: Neurosurgery;  Laterality: N/A;   MAXIMUM ACCESS (MAS)POSTERIOR LUMBAR INTERBODY FUSION (PLIF) 1 LEVEL N/A 02/14/2014   Procedure: FOR MAXIMUM ACCESS SURGERY POSTERIOR LUMBAR INTERBODY FUSION LUMBAR TWO-THREE;  Surgeon: Eustace Moore, MD;  Location: [facility];  Service: Neurosurgery;  Laterality: N/A;   NECK SURGERY     SHOULDER SURGERY     SPINE SURGERY     TONSILLECTOMY       Family History  Problem Relation Age of Onset   Colon cancer Father 57       pt thinks father had colon cancer   Stomach cancer Father    Hypertension Mother    Colon polyps Brother    Aneurysm Maternal Aunt 4       brain   Heart disease Maternal Aunt        d 51   Stomach cancer Paternal Uncle 38   Aneurysm Maternal Grandfather        stomach   Colon cancer Maternal Grandfather        thinks grandfather had colon cancer   Stomach cancer Paternal Grandfather 39   Depression Brother 99   Suicidality Brother    Stomach cancer Paternal Uncle 87   Esophageal cancer Neg Hx    Rectal cancer Neg Hx    Diabetes Neg Hx     Social History   Social History Narrative   Exercise--no     Review of Systems General: Denies fevers, chills, weight loss CV: Denies chest pain, shortness of breath, palpitations  Physical Exam    10/28/2021   10:50 AM 09/07/2021    2:07 PM 06/16/2021    8:31 AM  Vitals with BMI  Height '5\' 11"'$  '5\' 11"'$  '5\' 11"'$   Weight 254 lbs 6 oz 259 lbs 256 lbs  BMI 35.5 16.10 96.04  Systolic 540 981 191  Diastolic 87 90 92  Pulse 98 102 92    General:  No acute distress,  Alert and oriented, Non-Toxic, Normal speech and affect Examination shows a transverse scar in the right lower back.  There is a few millimeters of superficial nonepithelialized portion centrally.  No surrounding erythema or fluctuance.  No palpable subcutaneous fluid.  The implant is very superficial beneath the skin.  Assessment/Plan Patient presents with a superficial wound occurring after spinal stimulator placement.  At the moment this seems to be heading in the right direction and I would hesitate to do anything surgically.  There is not much of a wound left at this point and I covered this with a Mepilex bandage and would recommend him keeping it covered until it fully epithelializes.  I did indicate to him that persistent drainage that does not subside could be indicative of a low-grade  infection or seeding of the subcutaneous implant.  If that were to occur or persist more than likely removal of the implant would be required.  Hopefully things continue to head in the right direction and nothing else is needed from a surgical standpoint.  I am happy to see him back on an as-needed basis at his request.  All of his questions were  answered.  Cindra Presume 10/28/2021, 11:41 AM

## 2021-12-17 DIAGNOSIS — Z9689 Presence of other specified functional implants: Secondary | ICD-10-CM | POA: Diagnosis not present

## 2021-12-17 DIAGNOSIS — M7061 Trochanteric bursitis, right hip: Secondary | ICD-10-CM | POA: Diagnosis not present

## 2021-12-17 DIAGNOSIS — M961 Postlaminectomy syndrome, not elsewhere classified: Secondary | ICD-10-CM | POA: Diagnosis not present

## 2021-12-22 DIAGNOSIS — T148XXD Other injury of unspecified body region, subsequent encounter: Secondary | ICD-10-CM | POA: Diagnosis not present

## 2022-01-19 DIAGNOSIS — Z9689 Presence of other specified functional implants: Secondary | ICD-10-CM | POA: Diagnosis not present

## 2022-01-20 DIAGNOSIS — T148XXD Other injury of unspecified body region, subsequent encounter: Secondary | ICD-10-CM | POA: Diagnosis not present

## 2022-01-20 DIAGNOSIS — T85840A Pain due to nervous system prosthetic devices, implants and grafts, initial encounter: Secondary | ICD-10-CM | POA: Diagnosis not present

## 2022-01-20 DIAGNOSIS — T85113A Breakdown (mechanical) of implanted electronic neurostimulator, generator, initial encounter: Secondary | ICD-10-CM | POA: Diagnosis not present

## 2022-01-20 DIAGNOSIS — Z9689 Presence of other specified functional implants: Secondary | ICD-10-CM | POA: Diagnosis not present

## 2022-02-24 ENCOUNTER — Other Ambulatory Visit: Payer: Medicare Other

## 2022-03-02 ENCOUNTER — Encounter: Payer: Medicare Other | Admitting: Internal Medicine

## 2022-03-03 ENCOUNTER — Other Ambulatory Visit (INDEPENDENT_AMBULATORY_CARE_PROVIDER_SITE_OTHER): Payer: Medicare Other

## 2022-03-03 DIAGNOSIS — E7800 Pure hypercholesterolemia, unspecified: Secondary | ICD-10-CM

## 2022-03-03 DIAGNOSIS — E538 Deficiency of other specified B group vitamins: Secondary | ICD-10-CM | POA: Diagnosis not present

## 2022-03-03 DIAGNOSIS — Z0001 Encounter for general adult medical examination with abnormal findings: Secondary | ICD-10-CM

## 2022-03-03 DIAGNOSIS — E559 Vitamin D deficiency, unspecified: Secondary | ICD-10-CM

## 2022-03-03 DIAGNOSIS — R739 Hyperglycemia, unspecified: Secondary | ICD-10-CM

## 2022-03-03 LAB — CBC WITH DIFFERENTIAL/PLATELET
Basophils Absolute: 0.1 K/uL (ref 0.0–0.1)
Basophils Relative: 0.8 % (ref 0.0–3.0)
Eosinophils Absolute: 0.1 K/uL (ref 0.0–0.7)
Eosinophils Relative: 1.1 % (ref 0.0–5.0)
HCT: 44.4 % (ref 39.0–52.0)
Hemoglobin: 14.7 g/dL (ref 13.0–17.0)
Lymphocytes Relative: 23.2 % (ref 12.0–46.0)
Lymphs Abs: 2.5 K/uL (ref 0.7–4.0)
MCHC: 33.2 g/dL (ref 30.0–36.0)
MCV: 95.4 fl (ref 78.0–100.0)
Monocytes Absolute: 0.9 K/uL (ref 0.1–1.0)
Monocytes Relative: 8.5 % (ref 3.0–12.0)
Neutro Abs: 7 K/uL (ref 1.4–7.7)
Neutrophils Relative %: 66.4 % (ref 43.0–77.0)
Platelets: 312 K/uL (ref 150.0–400.0)
RBC: 4.65 Mil/uL (ref 4.22–5.81)
RDW: 14.4 % (ref 11.5–15.5)
WBC: 10.6 K/uL — ABNORMAL HIGH (ref 4.0–10.5)

## 2022-03-03 LAB — URINALYSIS, ROUTINE W REFLEX MICROSCOPIC
Bilirubin Urine: NEGATIVE
Leukocytes,Ua: NEGATIVE
Nitrite: NEGATIVE
Specific Gravity, Urine: 1.02 (ref 1.000–1.030)
Total Protein, Urine: NEGATIVE
Urine Glucose: NEGATIVE
Urobilinogen, UA: 1 (ref 0.0–1.0)
pH: 6.5 (ref 5.0–8.0)

## 2022-03-03 LAB — BASIC METABOLIC PANEL
BUN: 19 mg/dL (ref 6–23)
CO2: 31 mEq/L (ref 19–32)
Calcium: 9.4 mg/dL (ref 8.4–10.5)
Chloride: 101 mEq/L (ref 96–112)
Creatinine, Ser: 0.76 mg/dL (ref 0.40–1.50)
GFR: 97.86 mL/min (ref >60.00)
Glucose, Bld: 115 mg/dL — ABNORMAL HIGH (ref 70–99)
Potassium: 4.4 mEq/L (ref 3.5–5.1)
Sodium: 138 mEq/L (ref 135–145)

## 2022-03-03 LAB — VITAMIN D 25 HYDROXY (VIT D DEFICIENCY, FRACTURES): VITD: 52.83 ng/mL (ref 30.00–100.00)

## 2022-03-03 LAB — HEPATIC FUNCTION PANEL
ALT: 23 U/L (ref 0–53)
AST: 18 U/L (ref 0–37)
Albumin: 4.4 g/dL (ref 3.5–5.2)
Alkaline Phosphatase: 86 U/L (ref 39–117)
Bilirubin, Direct: 0.2 mg/dL (ref 0.0–0.3)
Total Bilirubin: 0.8 mg/dL (ref 0.2–1.2)
Total Protein: 6.7 g/dL (ref 6.0–8.3)

## 2022-03-03 LAB — LIPID PANEL
Cholesterol: 149 mg/dL (ref 0–200)
HDL: 56.7 mg/dL
LDL Cholesterol: 69 mg/dL (ref 0–99)
NonHDL: 92.67
Total CHOL/HDL Ratio: 3
Triglycerides: 117 mg/dL (ref 0.0–149.0)
VLDL: 23.4 mg/dL (ref 0.0–40.0)

## 2022-03-03 LAB — TSH: TSH: 2.37 u[IU]/mL (ref 0.35–5.50)

## 2022-03-03 LAB — HEMOGLOBIN A1C: Hgb A1c MFr Bld: 6.1 % (ref 4.6–6.5)

## 2022-03-03 LAB — VITAMIN B12: Vitamin B-12: 393 pg/mL (ref 211–911)

## 2022-03-03 LAB — PSA: PSA: 0.49 ng/mL (ref 0.10–4.00)

## 2022-03-09 ENCOUNTER — Encounter: Payer: Medicare Other | Admitting: Internal Medicine

## 2022-03-15 ENCOUNTER — Other Ambulatory Visit: Payer: Self-pay

## 2022-03-15 ENCOUNTER — Other Ambulatory Visit: Payer: Self-pay | Admitting: Internal Medicine

## 2022-03-15 NOTE — Telephone Encounter (Signed)
Please refill as per office routine med refill policy (all routine meds to be refilled for 3 mo or monthly (per pt preference) up to one year from last visit, then month to month grace period for 3 mo, then further med refills will have to be denied) ? ?

## 2022-03-16 DIAGNOSIS — B078 Other viral warts: Secondary | ICD-10-CM | POA: Diagnosis not present

## 2022-03-16 DIAGNOSIS — X32XXXD Exposure to sunlight, subsequent encounter: Secondary | ICD-10-CM | POA: Diagnosis not present

## 2022-03-16 DIAGNOSIS — L570 Actinic keratosis: Secondary | ICD-10-CM | POA: Diagnosis not present

## 2022-03-16 DIAGNOSIS — D225 Melanocytic nevi of trunk: Secondary | ICD-10-CM | POA: Diagnosis not present

## 2022-03-16 DIAGNOSIS — L821 Other seborrheic keratosis: Secondary | ICD-10-CM | POA: Diagnosis not present

## 2022-03-17 ENCOUNTER — Ambulatory Visit (INDEPENDENT_AMBULATORY_CARE_PROVIDER_SITE_OTHER): Payer: Medicare Other | Admitting: Internal Medicine

## 2022-03-17 ENCOUNTER — Encounter: Payer: Self-pay | Admitting: Internal Medicine

## 2022-03-17 ENCOUNTER — Ambulatory Visit: Payer: Medicare Other | Admitting: Psychiatry

## 2022-03-17 VITALS — BP 132/78 | HR 94 | Temp 99.1°F | Ht 71.0 in | Wt 255.0 lb

## 2022-03-17 DIAGNOSIS — R233 Spontaneous ecchymoses: Secondary | ICD-10-CM

## 2022-03-17 DIAGNOSIS — R3129 Other microscopic hematuria: Secondary | ICD-10-CM | POA: Diagnosis not present

## 2022-03-17 DIAGNOSIS — R739 Hyperglycemia, unspecified: Secondary | ICD-10-CM

## 2022-03-17 DIAGNOSIS — E7800 Pure hypercholesterolemia, unspecified: Secondary | ICD-10-CM | POA: Diagnosis not present

## 2022-03-17 DIAGNOSIS — Z86010 Personal history of colonic polyps: Secondary | ICD-10-CM | POA: Diagnosis not present

## 2022-03-17 DIAGNOSIS — T148XXD Other injury of unspecified body region, subsequent encounter: Secondary | ICD-10-CM | POA: Insufficient documentation

## 2022-03-17 DIAGNOSIS — F17200 Nicotine dependence, unspecified, uncomplicated: Secondary | ICD-10-CM | POA: Diagnosis not present

## 2022-03-17 DIAGNOSIS — Z0000 Encounter for general adult medical examination without abnormal findings: Secondary | ICD-10-CM | POA: Diagnosis not present

## 2022-03-17 DIAGNOSIS — Z0001 Encounter for general adult medical examination with abnormal findings: Secondary | ICD-10-CM

## 2022-03-17 NOTE — Assessment & Plan Note (Signed)
Lab Results  Component Value Date   LDLCALC 69 03/03/2022   Stable, pt to continue current statin lipitor 10 mg qd

## 2022-03-17 NOTE — Assessment & Plan Note (Addendum)
Pt counsled to quit, pt not ready, for pulm referral to start LDCT program

## 2022-03-17 NOTE — Assessment & Plan Note (Signed)
Asymptomatic, for CT renal r/o malignancy, but declines urology referral for now when informed he may need cystoscopy

## 2022-03-17 NOTE — Patient Instructions (Addendum)
You will be contacted regarding the referral for: colonoscopy with your history of polyps  Please have your Shingrix (shingles) shots done at your local pharmacy  Please stop smoking  You will be contacted regarding the referral for: pulmonary for the yearly CT scan chest lung screening  You will be contacted regarding the referral for: CT scan for the small blood in the urine  Please call if you change your mind about referral to Urology  Please continue all other medications as before, and refills have been done if requested.  Please have the pharmacy call with any other refills you may need.  Please continue your efforts at being more active, low cholesterol diet, and weight control.  You are otherwise up to date with prevention measures today.  Please keep your appointments with your specialists as you may have planned  Please make an Appointment to return in 6 months, or sooner if needed, also with Lab Appointment for testing done 3-5 days before at the [facility] (so this is for TWO appointments - please see the scheduling desk as you leave)

## 2022-03-17 NOTE — Assessment & Plan Note (Signed)
For colonoscopy as is due 

## 2022-03-17 NOTE — Assessment & Plan Note (Signed)
Age and sex appropriate education and counseling updated with regular exercise and diet Referrals for preventative services - for colonoscopy Immunizations addressed - for shignrx at pharmacy, declines flu shot Smoking counseling  - pt counsled to quit, pt not ready Evidence for depression or other mood disorder - none significant Most recent labs reviewed. I have personally reviewed and have noted: 1) the patient's medical and social history 2) The patient's current medications and supplements 3) The patient's height, weight, and BMI have been recorded in the chart

## 2022-03-17 NOTE — Assessment & Plan Note (Signed)
Lab Results  Component Value Date   HGBA1C 6.1 03/03/2022   Stable, pt to continue current medical treatment  - diet, wt control, excercise

## 2022-03-17 NOTE — Assessment & Plan Note (Signed)
Pt has no hx of trauma or overt bleeding, for INR with next labs

## 2022-03-17 NOTE — Progress Notes (Signed)
Patient ID: TRENDEN HAZELRIGG, male   DOB: 02-21-1961, 61 y.o.   MRN: [id number]         Chief Complaint:: wellness exam and smoking, microhematuria, htn, hld hx of colon polyps, easy bruising       HPI:  [name] is a 61 y.o. male here for wellness exam; pt ok for lung cancer screening LDCT yealry, for shingrix at pharmacy, for colonoscopy, declines flu shot o/w up to date                        Also sees pain management every 3 mo, now also s/p right lower back stimulator  Denies urinary symptoms such as dysuria, frequency, urgency, flank pain, hematuria or n/v, fever, chills.  Stil smoking, pt not ready to quit.  Pt denies chest pain, increased sob or doe, wheezing, orthopnea, PND, increased LE swelling, palpitations, dizziness or syncope.   Pt denies polydipsia, polyuria, or new focal neuro s/s. Also has numerous bruising to arms and few to legs with minor trauma.    Wt Readings from Last 3 Encounters:  03/17/22 255 lb (115.7 kg)  10/28/21 254 lb 6.4 oz (115.4 kg)  09/07/21 259 lb (117.5 kg)   BP Readings from Last 3 Encounters:  03/17/22 132/78  10/28/21 128/87  09/07/21 (!) 148/90   Immunization History  Administered Date(s) Administered   Fluad Quad(high Dose 65+) 03/19/2021   Influenza Split 03/07/2012   Influenza,inj,Quad PF,6+ Mos 01/13/2017, 01/23/2019, 02/21/2020, 01/26/2021   Influenza-Unspecified 05/03/2014, 01/31/2018   Moderna Sars-Covid-2 Vaccination 07/20/2019, 08/20/2019, 04/12/2020, 03/08/2022   Pfizer Covid-19 Vaccine Bivalent Booster 39yr & up 01/26/2021   Pneumococcal Polysaccharide-23 05/03/2008, 07/24/2020   Tdap 05/03/2008, 05/23/2018, 01/23/2019   Health Maintenance Due  Topic Date Due   Lung Cancer Screening  10/16/2019      Past Medical History:  Diagnosis Date   Allergy    Anxiety    Arthritis    Back   Chronic back pain    Colon polyps    COPD (chronic obstructive pulmonary disease) (HCC)    Depression    GERD (gastroesophageal  reflux disease)    every now and then   Headache    Hyperlipidemia 09/26/2015   Lumbar surgical wound fluid collection    lumbar wound dehiscence   Pneumonia 2020   Past Surgical History:  Procedure Laterality Date   BACK SURGERY     COLONOSCOPY     COLONOSCOPY W/ BIOPSIES AND POLYPECTOMY     EYE SURGERY     IR [place] BX ASP/DRAIN  11/18/2020   KNEE ARTHROSCOPY WITH MEDIAL MENISECTOMY Left 01/23/2016   Procedure: KNEE ARTHROSCOPY WITH MEDIAL MENISECTOMY;  Surgeon: PMelrose Nakayama MD;  [facility]  Service: Orthopedics;  Laterality: Left;   LUMBAR LAMINECTOMY/DECOMPRESSION MICRODISCECTOMY N/A 06/09/2020   Procedure: Decompressive laminectomy Lumbarone -two;  Surgeon: JEustace Moore MD;  [facility]  Service: Neurosurgery;  Laterality: N/A;   LUMBAR WOUND DEBRIDEMENT N/A 03/21/2014   Procedure: LUMBAR WOUND DEBRIDEMENT;  Surgeon: DEustace Moore MD;  [facility];  Service: Neurosurgery;  Laterality: N/A;   MAXIMUM ACCESS (MAS)POSTERIOR LUMBAR INTERBODY FUSION (PLIF) 1 LEVEL N/A 02/14/2014   Procedure: FOR MAXIMUM ACCESS SURGERY POSTERIOR LUMBAR INTERBODY FUSION LUMBAR TWO-THREE;  Surgeon: DEustace Moore MD;  [facility];  Service: Neurosurgery;  Laterality: N/A;   NECK SURGERY     SHOULDER SURGERY     SPINE SURGERY  TONSILLECTOMY      reports that he has been smoking cigarettes. He has a 22.00 pack-year smoking history. He has quit using smokeless tobacco.  His smokeless tobacco use included snuff. He reports current alcohol use of about 24.0 standard drinks of alcohol per week. He reports that he does not use drugs. family history includes Aneurysm in his maternal grandfather; Aneurysm (age of onset: 53) in his maternal aunt; Colon cancer in his maternal grandfather; Colon cancer (age of onset: 48) in his father; Colon polyps in his brother; Depression (age of onset: 14) in his brother; Heart disease in his maternal aunt; Hypertension in  his mother; Stomach cancer in his father; Stomach cancer (age of onset: 45) in his paternal uncle; Stomach cancer (age of onset: 37) in his paternal uncle; Stomach cancer (age of onset: 64) in his paternal grandfather; Suicidality in his brother. Allergies  Allergen Reactions   Cefuroxime Axetil Hives   Current Outpatient Medications on File Prior to Visit  Medication Sig Dispense Refill   acetaminophen (TYLENOL) 325 MG tablet Take 650 mg by mouth every 6 (six) hours as needed for mild pain, fever or headache.     albuterol (VENTOLIN HFA) 108 (90 Base) MCG/ACT inhaler Inhale 1-2 puffs into the lungs every 6 (six) hours as needed for wheezing or shortness of breath. 8.5 each 11   atorvastatin (LIPITOR) 10 MG tablet TAKE 1 TABLET BY MOUTH EVERY DAY 90 tablet 3   chlorhexidine (PERIDEX) 0.12 % solution      doxycycline (ADOXA) 100 MG tablet SMARTSIG:1 Tablet(s) By Mouth Every 12 Hours     DULoxetine (CYMBALTA) 60 MG capsule Take 60 mg by mouth at bedtime.  2   gabapentin (NEURONTIN) 300 MG capsule Take 1 capsule (300 mg total) by mouth 3 (three) times daily. 90 capsule 3   guaiFENesin (MUCINEX) 600 MG 12 hr tablet Take 2 tablets (1,200 mg total) by mouth 2 (two) times daily as needed. 60 tablet 5   meloxicam (MOBIC) 15 MG tablet Take 1 tablet (15 mg total) by mouth daily. 90 tablet 3   nicotine (NICODERM CQ - DOSED IN MG/24 HOURS) 14 mg/24hr patch Place 1 patch (14 mg total) onto the skin daily. 28 patch 0   Oxycodone HCl 10 MG TABS Take 1-2 tablets (10-20 mg total) by mouth every 4 (four) hours as needed (pain). 40 tablet 0   sodium chloride flush (NS) 0.9 % SOLN 10-40 mLs by Intracatheter route as needed (flush).     thiamine 100 MG tablet Take 1 tablet (100 mg total) by mouth daily. 30 tablet 0   tiZANidine (ZANAFLEX) 4 MG tablet Take 4 mg by mouth 3 (three) times daily.   2   triamcinolone (NASACORT) 55 MCG/ACT AERO nasal inhaler Place 2 sprays into the nose daily. 1 each 12   cetirizine  (ZYRTEC) 10 MG tablet Take 1 tablet (10 mg total) by mouth daily. 30 tablet 11   No current facility-administered medications on file prior to visit.        ROS:  All others reviewed and negative.  Objective        PE:  BP 132/78 (BP Location: Right Arm, Patient Position: Sitting, Cuff Size: Large)   Pulse 94   Temp 99.1 F (37.3 C) (Oral)   Ht '5\' 11"'$  (1.803 m)   Wt 255 lb (115.7 kg)   SpO2 95%   BMI 35.57 kg/m  Constitutional: Pt appears in NAD               HENT: Head: NCAT.                Right Ear: External ear normal.                 Left Ear: External ear normal.                Eyes: . Pupils are equal, round, and reactive to light. Conjunctivae and EOM are normal               Nose: without d/c or deformity               Neck: Neck supple. Gross normal ROM               Cardiovascular: Normal rate and regular rhythm.                 Pulmonary/Chest: Effort normal and breath sounds without rales or wheezing.                Abd:  Soft, NT, ND, + BS, no organomegaly               Neurological: Pt is alert. At baseline orientation, motor grossly intact               Skin: Skin is warm. No rashes, no other new lesions, LE edema - none               Psychiatric: Pt behavior is normal without agitation   Micro: none  Cardiac tracings I have personally interpreted today:  none  Pertinent Radiological findings (summarize): none   Lab Results  Component Value Date   WBC 10.6 (H) 03/03/2022   HGB 14.7 03/03/2022   HCT 44.4 03/03/2022   PLT 312.0 03/03/2022   GLUCOSE 115 (H) 03/03/2022   CHOL 149 03/03/2022   TRIG 117.0 03/03/2022   HDL 56.70 03/03/2022   LDLDIRECT 150.1 03/07/2012   LDLCALC 69 03/03/2022   ALT 23 03/03/2022   AST 18 03/03/2022   NA 138 03/03/2022   K 4.4 03/03/2022   CL 101 03/03/2022   CREATININE 0.76 03/03/2022   BUN 19 03/03/2022   CO2 31 03/03/2022   TSH 2.37 03/03/2022   PSA 0.49 03/03/2022   INR 1.0 11/17/2020   HGBA1C 6.1  03/03/2022   Assessment/Plan:  Xylan Sheils Colquhoun is a 61 y.o. White or Caucasian [1] male with  has a past medical history of Allergy, Anxiety, Arthritis, Chronic back pain, Colon polyps, COPD (chronic obstructive pulmonary disease) ([place]), Depression, GERD (gastroesophageal reflux disease), Headache, Hyperlipidemia (09/26/2015), Lumbar surgical wound fluid collection, and Pneumonia (2020).  Encounter for well adult exam with abnormal findings Age and sex appropriate education and counseling updated with regular exercise and diet Referrals for preventative services - for colonoscopy Immunizations addressed - for shignrx at pharmacy, declines flu shot Smoking counseling  - pt counsled to quit, pt not ready Evidence for depression or other mood disorder - none significant Most recent labs reviewed. I have personally reviewed and have noted: 1) the patient's medical and social history 2) The patient's current medications and supplements 3) The patient's height, weight, and BMI have been recorded in the chart   Smoker Pt counsled to quit, pt not ready, for pulm referral to start LDCT program  Microhematuria Asymptomatic, for CT renal r/o malignancy, but declines urology referral for now when  informed he may need cystoscopy  Hyperlipidemia Lab Results  Component Value Date   LDLCALC 69 03/03/2022   Stable, pt to continue current statin lipitor 10 mg qd   Hyperglycemia Lab Results  Component Value Date   HGBA1C 6.1 03/03/2022   Stable, pt to continue current medical treatment  - diet, wt control, excercise   History of colonic polyps For colonoscopy as is due  Easy bruising Pt has no hx of trauma or overt bleeding, for INR with next labs  Followup: Return in about 6 months (around 09/15/2022).  Cathlean Cower, MD 03/17/2022 7:33 PM [facility]

## 2022-03-23 DIAGNOSIS — M7061 Trochanteric bursitis, right hip: Secondary | ICD-10-CM | POA: Diagnosis not present

## 2022-03-23 DIAGNOSIS — Z9689 Presence of other specified functional implants: Secondary | ICD-10-CM | POA: Diagnosis not present

## 2022-03-23 DIAGNOSIS — M961 Postlaminectomy syndrome, not elsewhere classified: Secondary | ICD-10-CM | POA: Diagnosis not present

## 2022-04-06 NOTE — Progress Notes (Signed)
[facility] Phone: [phone] Subjective:    I'm seeing this patient by the request  of:  [name], MD  CC: Bilateral knee pain  LSL:[id number]  LESEAN WOOLVERTON is a 61 y.o. male coming in with complaint of B knee pain. Injections given in May 2023. Patient states that his knees are starting to bother him again can tell it is time for injections. Patient inquiring about the Gel injections that were talked about last visit, Patient was authorized for Durolane in may, re-ran today and is good to go if that is what is decided for today instead of cortisone.     Past Medical History:  Diagnosis Date   Allergy    Anxiety    Arthritis    Back   Chronic back pain    Colon polyps    COPD (chronic obstructive pulmonary disease) (HCC)    Depression    GERD (gastroesophageal reflux disease)    every now and then   Headache    Hyperlipidemia 09/26/2015   Lumbar surgical wound fluid collection    lumbar wound dehiscence   Pneumonia 2020   Past Surgical History:  Procedure Laterality Date   BACK SURGERY     COLONOSCOPY     COLONOSCOPY W/ BIOPSIES AND POLYPECTOMY     EYE SURGERY     IR US GUIDE BX ASP/DRAIN  11/18/2020   KNEE ARTHROSCOPY WITH MEDIAL MENISECTOMY Left 01/23/2016   Procedure: KNEE ARTHROSCOPY WITH MEDIAL MENISECTOMY;  Surgeon: Melrose Nakayama, MD;  Location: [facility];  Service: Orthopedics;  Laterality: Left;   LUMBAR LAMINECTOMY/DECOMPRESSION MICRODISCECTOMY N/A 06/09/2020   Procedure: Decompressive laminectomy Lumbarone -two;  Surgeon: Eustace Moore, MD;  Location: [facility];  Service: Neurosurgery;  Laterality: N/A;   LUMBAR WOUND DEBRIDEMENT N/A 03/21/2014   Procedure: LUMBAR WOUND DEBRIDEMENT;  Surgeon: Eustace Moore, MD;  Location: [facility];  Service: Neurosurgery;  Laterality: N/A;   MAXIMUM ACCESS (MAS)POSTERIOR LUMBAR INTERBODY FUSION (PLIF) 1 LEVEL N/A 02/14/2014    Procedure: FOR MAXIMUM ACCESS SURGERY POSTERIOR LUMBAR INTERBODY FUSION LUMBAR TWO-THREE;  Surgeon: Eustace Moore, MD;  Location: [facility];  Service: Neurosurgery;  Laterality: N/A;   NECK SURGERY     SHOULDER SURGERY     SPINE SURGERY     TONSILLECTOMY     Social History   Socioeconomic History   Marital status: Married    Spouse name: cindy   Number of children: Not on file   Years of education: Not on file   Highest education level: Not on file  Occupational History   Occupation: produce manager/ disability    Employer: [employer]  Tobacco Use   Smoking status: Every Day    Packs/day: 0.50    Years: 44.00    Total pack years: 22.00    Types: Cigarettes   Smokeless tobacco: Former    Types: Snuff  Vaping Use   Vaping Use: Never used  Substance and Sexual Activity   Alcohol use: Yes    Alcohol/week: 24.0 standard drinks of alcohol    Types: 24 Standard drinks or equivalent per week    Comment: social beer   Drug use: No   Sexual activity: Yes    Partners: Female  Other Topics Concern   Not on file  Social History Narrative   Exercise--no   Social Determinants of Health   Financial Resource Strain: Low Risk  (07/24/2020)  Overall Financial Resource Strain (CARDIA)    Difficulty of Paying Living Expenses: Not hard at all  Food Insecurity: No Food Insecurity (07/24/2020)   Hunger Vital Sign    Worried About Running Out of Food in the Last Year: Never true    Ran Out of Food in the Last Year: Never true  Transportation Needs: No Transportation Needs (07/24/2020)   PRAPARE - Hydrologist (Medical): No    Lack of Transportation (Non-Medical): No  Physical Activity: Inactive (07/24/2020)   Exercise Vital Sign    Days of Exercise per Week: 0 days    Minutes of Exercise per Session: 0 min  Stress: No Stress Concern Present (07/24/2020)   [place]    Feeling of Stress :  Not at all  Social Connections: Unknown (09/27/2018)   Social Connection and Isolation Panel [NHANES]    Frequency of Communication with Friends and Family: Not on file    Frequency of Social Gatherings with Friends and Family: Not on file    Attends Religious Services: Not on file    Active Member of Clubs or Organizations: Not on file    Attends Archivist Meetings: Not on file    Marital Status: Married   Allergies  Allergen Reactions   Cefuroxime Axetil Hives   Family History  Problem Relation Age of Onset   Colon cancer Father 72       pt thinks father had colon cancer   Stomach cancer Father    Hypertension Mother    Colon polyps Brother    Aneurysm Maternal Aunt 58       brain   Heart disease Maternal Aunt        d 36   Stomach cancer Paternal Uncle 57   Aneurysm Maternal Grandfather        stomach   Colon cancer Maternal Grandfather        thinks grandfather had colon cancer   Stomach cancer Paternal Grandfather 51   Depression Brother 40   Suicidality Brother    Stomach cancer Paternal Uncle 64   Esophageal cancer Neg Hx    Rectal cancer Neg Hx    Diabetes Neg Hx      Current Outpatient Medications (Cardiovascular):    atorvastatin (LIPITOR) 10 MG tablet, TAKE 1 TABLET BY MOUTH EVERY DAY  Current Outpatient Medications (Respiratory):    albuterol (VENTOLIN HFA) 108 (90 Base) MCG/ACT inhaler, Inhale 1-2 puffs into the lungs every 6 (six) hours as needed for wheezing or shortness of breath.   guaiFENesin (MUCINEX) 600 MG 12 hr tablet, Take 2 tablets (1,200 mg total) by mouth 2 (two) times daily as needed.   triamcinolone (NASACORT) 55 MCG/ACT AERO nasal inhaler, Place 2 sprays into the nose daily.   cetirizine (ZYRTEC) 10 MG tablet, Take 1 tablet (10 mg total) by mouth daily.  Current Outpatient Medications (Analgesics):    acetaminophen (TYLENOL) 325 MG tablet, Take 650 mg by mouth every 6 (six) hours as needed for mild pain, fever or headache.    meloxicam (MOBIC) 15 MG tablet, Take 1 tablet (15 mg total) by mouth daily.   Oxycodone HCl 10 MG TABS, Take 1-2 tablets (10-20 mg total) by mouth every 4 (four) hours as needed (pain).   Current Outpatient Medications (Other):    chlorhexidine (PERIDEX) 0.12 % solution,    DULoxetine (CYMBALTA) 60 MG capsule, Take 60 mg by mouth at bedtime.  gabapentin (NEURONTIN) 300 MG capsule, Take 1 capsule (300 mg total) by mouth 3 (three) times daily.   nicotine (NICODERM CQ - DOSED IN MG/24 HOURS) 14 mg/24hr patch, Place 1 patch (14 mg total) onto the skin daily.   sodium chloride flush (NS) 0.9 % SOLN, 10-40 mLs by Intracatheter route as needed (flush).   thiamine 100 MG tablet, Take 1 tablet (100 mg total) by mouth daily.   tiZANidine (ZANAFLEX) 4 MG tablet, Take 4 mg by mouth 3 (three) times daily.    doxycycline (ADOXA) 100 MG tablet, SMARTSIG:1 Tablet(s) By Mouth Every 12 Hours (Patient not taking: Reported on 04/08/2022)   Reviewed prior external information including notes and imaging from  primary care provider As well as notes that were available from care everywhere and other healthcare systems.  Past medical history, social, surgical and family history all reviewed in electronic medical record.  No pertanent information unless stated regarding to the chief complaint.   Review of Systems:  No headache, visual changes, nausea, vomiting, diarrhea, constipation, dizziness, abdominal pain, skin rash, fevers, chills, night sweats, weight loss, swollen lymph nodes, body aches, joint swelling, chest pain, shortness of breath, mood changes. POSITIVE muscle aches  Objective  Blood pressure (!) 140/82, pulse 91, height '5\' 11"'$  (1.803 m), weight 250 lb (113.4 kg), SpO2 97 %.   General: No apparent distress alert and oriented x3 mood and affect normal, dressed appropriately.  HEENT: Pupils equal, extraocular movements intact  Respiratory: Patient's speak in full sentences and does not appear short  of breath  Cardiovascular: No lower extremity edema, non tender, no erythema  Patient does have easy bruising in multiple different areas on his skin.  Bilateral knees do show a small effusion noted.  Seems to be more of the left greater than right at the moment.  Tenderness to palpation in the area as well.  Patient does have some instability with valgus and varus force.  After informed written and verbal consent, patient was seated on exam table. Right knee was prepped with alcohol swab and utilizing anterolateral approach, patient's right knee space was injected with 4:1  marcaine 0.5%: Kenalog '40mg'$ /dL. Patient tolerated the procedure well without immediate complications.  After informed written and verbal consent, patient was seated on exam table. Left knee was prepped with alcohol swab and utilizing anterolateral approach, patient's left knee space was injected with 4:1  marcaine 0.5%: Kenalog '40mg'$ /dL. Patient tolerated the procedure well without immediate complications.    Impression and Recommendations:    The above documentation has been reviewed and is accurate and complete Lyndal Pulley, DO

## 2022-04-08 ENCOUNTER — Ambulatory Visit: Payer: Medicare Other | Admitting: Family Medicine

## 2022-04-08 VITALS — BP 140/82 | HR 91 | Ht 71.0 in | Wt 250.0 lb

## 2022-04-08 DIAGNOSIS — M170 Bilateral primary osteoarthritis of knee: Secondary | ICD-10-CM

## 2022-04-08 NOTE — Assessment & Plan Note (Signed)
Bilateral injections given today.  Chronic problem with exacerbation and worsening symptoms.  Still wants to avoid any surgical and secondary to other comorbidities.  Patient is not doing as well with the back surgery still.  Discussed which activities to do things to avoid.  Follow-up with me again in 6 to 8 weeks to discuss the possibility of viscosupplementation.

## 2022-04-08 NOTE — Patient Instructions (Addendum)
Good to see you Injections in both knees  Enjoy shopping spree at [facility] Follow up in 2 months and we can consider the Gel at that time

## 2022-04-19 ENCOUNTER — Ambulatory Visit
Admission: RE | Admit: 2022-04-19 | Discharge: 2022-04-19 | Disposition: A | Discharge status: Home / Self Care | Payer: Medicare Other | Source: Ambulatory Visit | Attending: Internal Medicine | Admitting: Internal Medicine

## 2022-04-19 DIAGNOSIS — K4020 Bilateral inguinal hernia, without obstruction or gangrene, not specified as recurrent: Secondary | ICD-10-CM | POA: Diagnosis not present

## 2022-04-19 DIAGNOSIS — N200 Calculus of kidney: Secondary | ICD-10-CM | POA: Diagnosis not present

## 2022-04-19 DIAGNOSIS — K429 Umbilical hernia without obstruction or gangrene: Secondary | ICD-10-CM | POA: Diagnosis not present

## 2022-04-19 DIAGNOSIS — K5730 Diverticulosis of large intestine without perforation or abscess without bleeding: Secondary | ICD-10-CM | POA: Diagnosis not present

## 2022-04-19 DIAGNOSIS — R3129 Other microscopic hematuria: Secondary | ICD-10-CM

## 2022-05-12 ENCOUNTER — Ambulatory Visit: Payer: Medicare Other | Admitting: Psychiatry

## 2022-05-13 ENCOUNTER — Ambulatory Visit: Payer: Medicare Other | Admitting: Psychiatry

## 2022-05-17 ENCOUNTER — Ambulatory Visit (AMBULATORY_SURGERY_CENTER): Payer: Medicare Other

## 2022-05-17 DIAGNOSIS — B9689 Other specified bacterial agents as the cause of diseases classified elsewhere: Secondary | ICD-10-CM | POA: Diagnosis not present

## 2022-05-17 DIAGNOSIS — L02422 Furuncle of left axilla: Secondary | ICD-10-CM | POA: Diagnosis not present

## 2022-05-17 DIAGNOSIS — Z86010 Personal history of colonic polyps: Secondary | ICD-10-CM

## 2022-05-17 DIAGNOSIS — Z800 Family history of malignant neoplasm of digestive organs: Secondary | ICD-10-CM

## 2022-05-17 MED ORDER — NA SULFATE-K SULFATE-MG SULF 17.5-3.13-1.6 GM/177ML PO SOLN: 1.0000 | Freq: Once | ORAL | 0 refills | Status: AC

## 2022-05-17 NOTE — Progress Notes (Signed)

## 2022-05-18 DIAGNOSIS — D485 Neoplasm of uncertain behavior of skin: Secondary | ICD-10-CM | POA: Diagnosis not present

## 2022-05-18 DIAGNOSIS — A499 Bacterial infection, unspecified: Secondary | ICD-10-CM | POA: Diagnosis not present

## 2022-05-24 ENCOUNTER — Ambulatory Visit (INDEPENDENT_AMBULATORY_CARE_PROVIDER_SITE_OTHER): Payer: Medicare Other | Admitting: Psychiatry

## 2022-05-24 DIAGNOSIS — Z62819 Personal history of unspecified abuse in childhood: Secondary | ICD-10-CM

## 2022-05-24 DIAGNOSIS — F1010 Alcohol abuse, uncomplicated: Secondary | ICD-10-CM | POA: Diagnosis not present

## 2022-05-24 DIAGNOSIS — F331 Major depressive disorder, recurrent, moderate: Secondary | ICD-10-CM

## 2022-05-24 DIAGNOSIS — G8929 Other chronic pain: Secondary | ICD-10-CM

## 2022-05-24 DIAGNOSIS — Z636 Dependent relative needing care at home: Secondary | ICD-10-CM

## 2022-05-24 DIAGNOSIS — Z630 Problems in relationship with spouse or partner: Secondary | ICD-10-CM | POA: Diagnosis not present
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
## 2022-05-27 ENCOUNTER — Encounter: Payer: Self-pay | Admitting: Gastroenterology

## 2022-05-29 ENCOUNTER — Encounter: Payer: Self-pay | Admitting: Certified Registered Nurse Anesthetist

## 2022-06-01 ENCOUNTER — Encounter: Payer: Self-pay | Admitting: *Deleted

## 2022-06-01 DIAGNOSIS — Z9689 Presence of other specified functional implants: Secondary | ICD-10-CM | POA: Diagnosis not present

## 2022-06-01 DIAGNOSIS — M961 Postlaminectomy syndrome, not elsewhere classified: Secondary | ICD-10-CM | POA: Diagnosis not present

## 2022-06-01 DIAGNOSIS — M7061 Trochanteric bursitis, right hip: Secondary | ICD-10-CM | POA: Diagnosis not present
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
## 2022-06-04 ENCOUNTER — Ambulatory Visit (AMBULATORY_SURGERY_CENTER): Payer: Medicare Other | Admitting: Gastroenterology

## 2022-06-04 ENCOUNTER — Encounter: Payer: Self-pay | Admitting: Gastroenterology

## 2022-06-04 VITALS — BP 168/97 | HR 79 | Temp 99.1°F | Resp 14 | Ht 71.0 in | Wt 247.0 lb

## 2022-06-04 DIAGNOSIS — K635 Polyp of colon: Secondary | ICD-10-CM | POA: Diagnosis not present

## 2022-06-04 DIAGNOSIS — Z800 Family history of malignant neoplasm of digestive organs: Secondary | ICD-10-CM

## 2022-06-04 DIAGNOSIS — D125 Benign neoplasm of sigmoid colon: Secondary | ICD-10-CM

## 2022-06-04 DIAGNOSIS — D122 Benign neoplasm of ascending colon: Secondary | ICD-10-CM | POA: Diagnosis not present

## 2022-06-04 DIAGNOSIS — K5120 Ulcerative (chronic) proctitis without complications: Secondary | ICD-10-CM | POA: Diagnosis not present

## 2022-06-04 DIAGNOSIS — D120 Benign neoplasm of cecum: Secondary | ICD-10-CM

## 2022-06-04 DIAGNOSIS — Z86010 Personal history of colonic polyps: Secondary | ICD-10-CM

## 2022-06-04 DIAGNOSIS — F32A Depression, unspecified: Secondary | ICD-10-CM | POA: Diagnosis not present

## 2022-06-04 DIAGNOSIS — D123 Benign neoplasm of transverse colon: Secondary | ICD-10-CM | POA: Diagnosis not present

## 2022-06-04 DIAGNOSIS — J449 Chronic obstructive pulmonary disease, unspecified: Secondary | ICD-10-CM | POA: Diagnosis not present

## 2022-06-04 DIAGNOSIS — Z09 Encounter for follow-up examination after completed treatment for conditions other than malignant neoplasm: Secondary | ICD-10-CM | POA: Diagnosis not present

## 2022-06-04 MED ORDER — SODIUM CHLORIDE 0.9 % IV SOLN: 500.0000 mL | INTRAVENOUS | Status: DC

## 2022-06-04 NOTE — Progress Notes (Signed)
Pt's states no medical or surgical changes since previsit or office visit. 

## 2022-06-04 NOTE — Progress Notes (Signed)
BP  171/110, Labetalol given IV, MD update, vss

## 2022-06-04 NOTE — Patient Instructions (Signed)

## 2022-06-04 NOTE — Progress Notes (Signed)
Report given to PACU, vss 

## 2022-06-04 NOTE — Progress Notes (Signed)
Called to room to assist during endoscopic procedure.  Patient ID and intended procedure confirmed with present staff. Received instructions for my participation in the procedure from the performing physician.  

## 2022-06-04 NOTE — Progress Notes (Signed)
[facility] History and Physical   Primary Care Physician:  [name], MD   Reason for Procedure:   History of colon polyps, family history of colon cancer  Plan:    colonoscopy     HPI: [name] is a 62 y.o. male  here for colonoscopy surveillance - multiple polyps removed in the past, advanced SSP removed 2017 in piecemeal, last exam in 2018. Overdue for surveillance (recommended 3 year follow up). Patient denies any bowel symptoms at this time. Otherwise feels well without any cardiopulmonary symptoms.   I have discussed risks / benefits of anesthesia and endoscopic procedure with [facility] and they wish to proceed with the exams as outlined today.    Past Medical History:  Diagnosis Date   Allergy    Anxiety    Arthritis    Back   Chronic back pain    Colon polyps    COPD (chronic obstructive pulmonary disease) (HCC)    Depression    GERD (gastroesophageal reflux disease)    every now and then   Headache    Hyperlipidemia 09/26/2015   Lumbar surgical wound fluid collection    lumbar wound dehiscence   Pneumonia 2020    Past Surgical History:  Procedure Laterality Date   BACK SURGERY     COLONOSCOPY     COLONOSCOPY W/ BIOPSIES AND POLYPECTOMY     EYE SURGERY     IR US GUIDE BX ASP/DRAIN  11/18/2020   KNEE ARTHROSCOPY WITH MEDIAL MENISECTOMY Left 01/23/2016   Procedure: KNEE ARTHROSCOPY WITH MEDIAL MENISECTOMY;  Surgeon: Melrose Nakayama, MD;  Location: [place];  Service: Orthopedics;  Laterality: Left;   LUMBAR LAMINECTOMY/DECOMPRESSION MICRODISCECTOMY N/A 06/09/2020   Procedure: Decompressive laminectomy Lumbarone -two;  Surgeon: Eustace Moore, MD;  Location: [place];  Service: Neurosurgery;  Laterality: N/A;   LUMBAR WOUND DEBRIDEMENT N/A 03/21/2014   Procedure: LUMBAR WOUND DEBRIDEMENT;  Surgeon: Eustace Moore, MD;  Location: [place] [facility];  Service: Neurosurgery;  Laterality: N/A;   MAXIMUM ACCESS (MAS)POSTERIOR  LUMBAR INTERBODY FUSION (PLIF) 1 LEVEL N/A 02/14/2014   Procedure: FOR MAXIMUM ACCESS SURGERY POSTERIOR LUMBAR INTERBODY FUSION LUMBAR TWO-THREE;  Surgeon: Eustace Moore, MD;  Location: [place] [facility];  Service: Neurosurgery;  Laterality: N/A;   NECK SURGERY     SHOULDER SURGERY     SPINE SURGERY     TONSILLECTOMY      Prior to Admission medications   Medication Sig Start Date End Date Taking? Authorizing Provider  atorvastatin (LIPITOR) 10 MG tablet TAKE 1 TABLET BY MOUTH EVERY DAY 03/15/22  Yes [name], MD  DULoxetine (CYMBALTA) 60 MG capsule Take 60 mg by mouth at bedtime. 11/05/14  Yes [provider]  gabapentin (NEURONTIN) 300 MG capsule Take 1 capsule (300 mg total) by mouth 3 (three) times daily. 04/21/17  Yes Rosemarie Ax, MD  meloxicam (MOBIC) 15 MG tablet Take 1 tablet (15 mg total) by mouth daily. 09/27/18  Yes [name], MD  Oxycodone HCl 10 MG TABS Take 1-2 tablets (10-20 mg total) by mouth every 4 (four) hours as needed (pain). 06/10/20  Yes Eustace Moore, MD  tiZANidine (ZANAFLEX) 4 MG tablet Take 4 mg by mouth 3 (three) times daily.  11/05/14  Yes [provider]  acetaminophen (TYLENOL) 325 MG tablet Take 650 mg by mouth every 6 (six) hours as needed for mild pain, fever or headache.    [provider]  albuterol (  VENTOLIN HFA) 108 (90 Base) MCG/ACT inhaler Inhale 1-2 puffs into the lungs every 6 (six) hours as needed for wheezing or shortness of breath. 02/24/21   [name], MD  cetirizine (ZYRTEC) 10 MG tablet Take 1 tablet (10 mg total) by mouth daily. 02/24/21 02/24/22  [name], MD  triamcinolone (NASACORT) 55 MCG/ACT AERO nasal inhaler Place 2 sprays into the nose daily. 02/24/21   [name], MD    Current Outpatient Medications  Medication Sig Dispense Refill   atorvastatin (LIPITOR) 10 MG tablet TAKE 1 TABLET BY MOUTH EVERY DAY 90 tablet 3   DULoxetine (CYMBALTA) 60 MG capsule Take 60 mg by mouth at bedtime.  2    gabapentin (NEURONTIN) 300 MG capsule Take 1 capsule (300 mg total) by mouth 3 (three) times daily. 90 capsule 3   meloxicam (MOBIC) 15 MG tablet Take 1 tablet (15 mg total) by mouth daily. 90 tablet 3   Oxycodone HCl 10 MG TABS Take 1-2 tablets (10-20 mg total) by mouth every 4 (four) hours as needed (pain). 40 tablet 0   tiZANidine (ZANAFLEX) 4 MG tablet Take 4 mg by mouth 3 (three) times daily.   2   acetaminophen (TYLENOL) 325 MG tablet Take 650 mg by mouth every 6 (six) hours as needed for mild pain, fever or headache.     albuterol (VENTOLIN HFA) 108 (90 Base) MCG/ACT inhaler Inhale 1-2 puffs into the lungs every 6 (six) hours as needed for wheezing or shortness of breath. 8.5 each 11   cetirizine (ZYRTEC) 10 MG tablet Take 1 tablet (10 mg total) by mouth daily. 30 tablet 11   triamcinolone (NASACORT) 55 MCG/ACT AERO nasal inhaler Place 2 sprays into the nose daily. 1 each 12   Current Facility-Administered Medications  Medication Dose Route Frequency Provider Last Rate Last Admin   0.9 %  sodium chloride infusion  500 mL Intravenous Continuous Palma Buster, Carlota Raspberry, MD        Allergies as of 06/04/2022 - Review Complete 06/04/2022  Allergen Reaction Noted   Cefuroxime axetil Hives 06/17/2009    Family History  Problem Relation Age of Onset   Colon cancer Father 52       pt thinks father had colon cancer   Stomach cancer Father    Hypertension Mother    Colon polyps Brother    Aneurysm Maternal Aunt 58       brain   Heart disease Maternal Aunt        d 10   Stomach cancer Paternal Uncle 57   Aneurysm Maternal Grandfather        stomach   Colon cancer Maternal Grandfather        thinks grandfather had colon cancer   Stomach cancer Paternal Grandfather 71   Depression Brother 72   Suicidality Brother    Stomach cancer Paternal Uncle 37   Esophageal cancer Neg Hx    Rectal cancer Neg Hx    Diabetes Neg Hx     Social History   Socioeconomic History   Marital status:  Married    Spouse name: cindy   Number of children: Not on file   Years of education: Not on file   Highest education level: Not on file  Occupational History   Occupation: produce manager/ disability    Employer: [employer]  Tobacco Use   Smoking status: Every Day    Packs/day: 0.50    Years: 44.00    Total pack years:  22.00    Types: Cigarettes   Smokeless tobacco: Former    Types: Snuff  Vaping Use   Vaping Use: Never used  Substance and Sexual Activity   Alcohol use: Yes    Alcohol/week: 15.0 standard drinks of alcohol    Types: 15 Standard drinks or equivalent per week    Comment: social beer   Drug use: No   Sexual activity: Yes    Partners: Female  Other Topics Concern   Not on file  Social History Narrative   Exercise--no   Social Determinants of Health   Financial Resource Strain: Low Risk  (07/24/2020)   Overall Financial Resource Strain (CARDIA)    Difficulty of Paying Living Expenses: Not hard at all  Food Insecurity: No Food Insecurity (07/24/2020)   Hunger Vital Sign    Worried About Running Out of Food in the Last Year: Never true    [place] in the Last Year: Never true  Transportation Needs: No Transportation Needs (07/24/2020)   PRAPARE - Hydrologist (Medical): No    Lack of Transportation (Non-Medical): No  Physical Activity: Inactive (07/24/2020)   Exercise Vital Sign    Days of Exercise per Week: 0 days    Minutes of Exercise per Session: 0 min  Stress: No Stress Concern Present (07/24/2020)   [place]    Feeling of Stress : Not at all  Social Connections: Unknown (09/27/2018)   Social Connection and Isolation Panel [NHANES]    Frequency of Communication with Friends and Family: Not on file    Frequency of Social Gatherings with Friends and Family: Not on file    Attends Religious Services: Not on file    Active Member of Clubs or  Organizations: Not on file    Attends Archivist Meetings: Not on file    Marital Status: Married  Intimate Partner Violence: Not At Risk (07/25/2017)   Humiliation, Afraid, Rape, and Kick questionnaire    Fear of Current or Ex-Partner: No    Emotionally Abused: No    Physically Abused: No    Sexually Abused: No    Review of Systems: All other review of systems negative except as mentioned in the HPI.  Physical Exam: Vital signs BP (!) 150/93   Pulse 86   Temp 99.1 F (37.3 C)   Ht '5\' 11"'$  (1.803 m)   Wt 247 lb (112 kg)   SpO2 98%   BMI 34.45 kg/m   General:   Alert,  Well-developed, pleasant and cooperative in NAD Lungs:  Clear throughout to auscultation.   Heart:  Regular rate and rhythm Abdomen:  Soft, nontender and nondistended.   Neuro/Psych:  Alert and cooperative. Normal mood and affect. A and O x 3  Jolly Mango, MD [facility]

## 2022-06-04 NOTE — Op Note (Signed)
[facility] Patient Name: [name] Procedure Date: 06/04/2022 7:36 AM MRN: [id number] Endoscopist: Remo Lipps P. Havery Moros , MD, [id number] Age: 62 Referring MD:  Date of Birth: 08-25-1960 Gender: Male Account #: [id number] Procedure:                Colonoscopy Indications:              High risk colon cancer surveillance: Personal                            history of colonic polyps - advanced sessile                            serrated polyp removed in 2017, other polyps in                            2017 / 2018, last exam 2018. Father had colon                            cancer around age 67 Medicines:                Monitored Anesthesia Care Procedure:                Pre-Anesthesia Assessment:                           - Prior to the procedure, a History and Physical                            was performed, and patient medications and                            allergies were reviewed. The patient's tolerance of                            previous anesthesia was also reviewed. The risks                            and benefits of the procedure and the sedation                            options and risks were discussed with the patient.                            All questions were answered, and informed consent                            was obtained. Prior Anticoagulants: The patient has                            taken no anticoagulant or antiplatelet agents. ASA                            Grade Assessment: III - A patient with severe  systemic disease. After reviewing the risks and                            benefits, the patient was deemed in satisfactory                            condition to undergo the procedure.                           After obtaining informed consent, the colonoscope                            was passed under direct vision. Throughout the                            procedure, the patient's blood pressure,  pulse, and                            oxygen saturations were monitored continuously. The                            Olympus CF-HQ190L [id number]) Colonoscope was                            introduced through the anus and advanced to the the                            cecum, identified by appendiceal orifice and                            ileocecal valve. The colonoscopy was performed                            without difficulty. The patient tolerated the                            procedure well. The quality of the bowel                            preparation was adequate. The ileocecal valve,                            appendiceal orifice, and rectum were photographed. Scope In: 8:12:01 AM Scope Out: 8:41:55 AM Scope Withdrawal Time: 0 hours 26 minutes 50 seconds  Total Procedure Duration: 0 hours 29 minutes 54 seconds  Findings:                 The perianal and digital rectal examinations were                            normal.                           A 3 mm polyp was found in the cecum. The polyp was  sessile. The polyp was removed with a cold snare.                            Resection and retrieval were complete.                           A 4 mm polyp was found in the ascending colon. The                            polyp was flat. The polyp was removed with a cold                            snare. Resection and retrieval were complete.                           Five flat polyps were found in the transverse                            colon. The polyps were 3 to 5 mm in size. These                            polyps were removed with a cold snare. Resection                            and retrieval were complete.                           Five sessile polyps were found in the sigmoid                            colon. The polyps were 4 to 5 mm in size. These                            polyps were removed with a cold snare. Resection                             and retrieval were complete.                           A few small-mouthed diverticula were found in the                            sigmoid colon and cecum.                           Mild suspected inflammation was found in the distal                            rectum with some adherent heme - nonspecific.                            Biopsies were taken with a cold forceps for  histology.                           Internal hemorrhoids were found during retroflexion.                           There was an old tattoo in the transverse colon.                            The exam was otherwise without abnormality. Lot of                            residual nuts in the colon which required lavage.                            Poor air retention in the left colon Complications:            No immediate complications. Estimated blood loss:                            Minimal. Estimated Blood Loss:     Estimated blood loss was minimal. Impression:               - One 3 mm polyp in the cecum, removed with a cold                            snare. Resected and retrieved.                           - One 4 mm polyp in the ascending colon, removed                            with a cold snare. Resected and retrieved.                           - Five 3 to 5 mm polyps in the transverse colon,                            removed with a cold snare. Resected and retrieved.                           - Five 4 to 5 mm polyps in the sigmoid colon,                            removed with a cold snare. Resected and retrieved.                           - Diverticulosis in the sigmoid colon and in the                            cecum.                           - Mild inflammation was found in the distal rectum.  Biopsied.                           - Internal hemorrhoids.                           - The examination was otherwise normal. Recommendation:           - Patient  has a contact number available for                            emergencies. The signs and symptoms of potential                            delayed complications were discussed with the                            patient. Return to normal activities tomorrow.                            Written discharge instructions were provided to the                            patient.                           - Resume previous diet.                           - Continue present medications.                           - Await pathology results. Anticipate repeat exam                            in 3 years. Remo Lipps P. Nathaneil Feagans, MD 06/04/2022 8:50:14 AM This report has been signed electronically.

## 2022-06-07 ENCOUNTER — Telehealth: Payer: Self-pay | Admitting: *Deleted

## 2022-06-07 NOTE — Telephone Encounter (Signed)
  Follow up Call-    Row Labels 06/04/2022    7:37 AM  Call back number   Section Header. No data exists in this row.   Post procedure Call Back phone  #   [phone]  Permission to leave phone message   Yes     Patient questions:  Do you have a fever, pain , or abdominal swelling? No. Pain Score  0 *  Have you tolerated food without any problems? Yes.    Have you been able to return to your normal activities? Yes.    Do you have any questions about your discharge instructions: Diet   No. Medications  No. Follow up visit  No.  Do you have questions or concerns about your Care? No.  Actions: * If pain score is 4 or above: No action needed, pain <4.

## 2022-06-09 ENCOUNTER — Ambulatory Visit: Payer: Medicare Other | Admitting: Family Medicine

## 2022-06-10 ENCOUNTER — Ambulatory Visit: Payer: Medicare Other | Admitting: Psychiatry

## 2022-06-15 ENCOUNTER — Encounter: Payer: Self-pay | Admitting: Gastroenterology

## 2022-06-16 ENCOUNTER — Ambulatory Visit: Payer: Medicare Other | Admitting: Psychiatry

## 2022-06-17 ENCOUNTER — Ambulatory Visit: Payer: Medicare Other | Admitting: Family Medicine

## 2022-06-17 VITALS — BP 162/90 | HR 91 | Ht 71.0 in | Wt 254.0 lb

## 2022-06-17 DIAGNOSIS — R3129 Other microscopic hematuria: Secondary | ICD-10-CM | POA: Diagnosis not present

## 2022-06-17 DIAGNOSIS — M170 Bilateral primary osteoarthritis of knee: Secondary | ICD-10-CM | POA: Diagnosis not present

## 2022-06-17 DIAGNOSIS — R233 Spontaneous ecchymoses: Secondary | ICD-10-CM | POA: Diagnosis not present

## 2022-06-17 MED ORDER — SODIUM HYALURONATE 60 MG/3ML IX PRSY
120.0000 mg | PREFILLED_SYRINGE | Freq: Once | INTRA_ARTICULAR | Status: AC
  Administered 2022-06-17: 120 mg via INTRA_ARTICULAR

## 2022-06-17 NOTE — Assessment & Plan Note (Signed)
Bilateral injections given, discussed placing patient in home exercises, which activities to do.  Hopeful that this chronic problem with worsening symptoms does improve.  Follow-up again in 2 months

## 2022-06-17 NOTE — Patient Instructions (Signed)
See you again in 3 months

## 2022-06-17 NOTE — Assessment & Plan Note (Signed)
Lengthy to hematology to see if anything else is contributing

## 2022-06-17 NOTE — Progress Notes (Signed)
[name] [place] [address] [place] Phone: [phone] Subjective:   [name], am serving as a scribe for Dr. Hulan Saas.  I'm seeing this patient by the request  of:  [name], MD  CC: knee pain   [id number]  04/08/2022 Bilateral injections given today.  Chronic problem with exacerbation and worsening symptoms.  Still wants to avoid any surgical and secondary to other comorbidities.  Patient is not doing as well with the back surgery still.  Discussed which activities to do things to avoid.  Follow-up with me again in 6 to 8 weeks to discuss the possibility of viscosupplementation   Updatted 06/17/2022 [name] is a 62 y.o. male coming in with complaint of knee pain. Here for knee injections.       Past Medical History:  Diagnosis Date   Allergy    Anxiety    Arthritis    Back   Chronic back pain    Colon polyps    COPD (chronic obstructive pulmonary disease) (HCC)    Depression    GERD (gastroesophageal reflux disease)    every now and then   Headache    Hyperlipidemia 09/26/2015   Lumbar surgical wound fluid collection    lumbar wound dehiscence   Pneumonia 2020   Past Surgical History:  Procedure Laterality Date   BACK SURGERY     COLONOSCOPY     COLONOSCOPY W/ BIOPSIES AND POLYPECTOMY     EYE SURGERY     IR US GUIDE BX ASP/DRAIN  11/18/2020   KNEE ARTHROSCOPY WITH MEDIAL MENISECTOMY Left 01/23/2016   Procedure: KNEE ARTHROSCOPY WITH MEDIAL MENISECTOMY;  Surgeon: Melrose Nakayama, MD;  Location: [facility];  Service: Orthopedics;  Laterality: Left;   LUMBAR LAMINECTOMY/DECOMPRESSION MICRODISCECTOMY N/A 06/09/2020   Procedure: Decompressive laminectomy Lumbarone -two;  Surgeon: Eustace Moore, MD;  Location: [facility];  Service: Neurosurgery;  Laterality: N/A;   LUMBAR WOUND DEBRIDEMENT N/A 03/21/2014   Procedure: LUMBAR WOUND DEBRIDEMENT;  Surgeon: Eustace Moore, MD;  Location: [facility];  Service: Neurosurgery;  Laterality: N/A;   MAXIMUM ACCESS (MAS)POSTERIOR LUMBAR INTERBODY FUSION (PLIF) 1 LEVEL N/A 02/14/2014   Procedure: FOR MAXIMUM ACCESS SURGERY POSTERIOR LUMBAR INTERBODY FUSION LUMBAR TWO-THREE;  Surgeon: Eustace Moore, MD;  Location: [facility];  Service: Neurosurgery;  Laterality: N/A;   NECK SURGERY     SHOULDER SURGERY     SPINE SURGERY     TONSILLECTOMY     Social History   Socioeconomic History   Marital status: Married    Spouse name: cindy   Number of children: Not on file   Years of education: Not on file   Highest education level: Not on file  Occupational History   Occupation: produce manager/ disability    Employer: [employer]  Tobacco Use   Smoking status: Every Day    Packs/day: 0.50    Years: 44.00    Total pack years: 22.00    Types: Cigarettes   Smokeless tobacco: Former    Types: Snuff  Vaping Use   Vaping Use: Never used  Substance and Sexual Activity   Alcohol use: Yes    Alcohol/week: 15.0 standard drinks of alcohol    Types: 15 Standard drinks or equivalent per week    Comment: social beer   Drug use: No   Sexual activity: Yes    Partners: Female  Other Topics Concern   Not on  file  Social History Narrative   Exercise--no   Social Determinants of Health   Financial Resource Strain: Low Risk  (07/24/2020)   Overall Financial Resource Strain (CARDIA)    Difficulty of Paying Living Expenses: Not hard at all  Food Insecurity: No Food Insecurity (07/24/2020)   Hunger Vital Sign    Worried About Running Out of Food in the Last Year: Never true    Ran Out of Food in the Last Year: Never true  Transportation Needs: No Transportation Needs (07/24/2020)   PRAPARE - Hydrologist (Medical): No    Lack of Transportation (Non-Medical): No  Physical Activity: Inactive (07/24/2020)   Exercise Vital Sign    Days of Exercise per Week: 0 days    Minutes of Exercise per Session: 0 min  Stress: No  Stress Concern Present (07/24/2020)   [place]    Feeling of Stress : Not at all  Social Connections: Unknown (09/27/2018)   Social Connection and Isolation Panel [NHANES]    Frequency of Communication with Friends and Family: Not on file    Frequency of Social Gatherings with Friends and Family: Not on file    Attends Religious Services: Not on file    Active Member of Clubs or Organizations: Not on file    Attends Archivist Meetings: Not on file    Marital Status: Married   Allergies  Allergen Reactions   Cefuroxime Axetil Hives   Family History  Problem Relation Age of Onset   Colon cancer Father 89       pt thinks father had colon cancer   Stomach cancer Father    Hypertension Mother    Colon polyps Brother    Aneurysm Maternal Aunt 58       brain   Heart disease Maternal Aunt        d 61   Stomach cancer Paternal Uncle 57   Aneurysm Maternal Grandfather        stomach   Colon cancer Maternal Grandfather        thinks grandfather had colon cancer   Stomach cancer Paternal Grandfather 68   Depression Brother 46   Suicidality Brother    Stomach cancer Paternal Uncle 6   Esophageal cancer Neg Hx    Rectal cancer Neg Hx    Diabetes Neg Hx      Current Outpatient Medications (Cardiovascular):    atorvastatin (LIPITOR) 10 MG tablet, TAKE 1 TABLET BY MOUTH EVERY DAY  Current Outpatient Medications (Respiratory):    albuterol (VENTOLIN HFA) 108 (90 Base) MCG/ACT inhaler, Inhale 1-2 puffs into the lungs every 6 (six) hours as needed for wheezing or shortness of breath.   cetirizine (ZYRTEC) 10 MG tablet, Take 1 tablet (10 mg total) by mouth daily.   triamcinolone (NASACORT) 55 MCG/ACT AERO nasal inhaler, Place 2 sprays into the nose daily.  Current Outpatient Medications (Analgesics):    acetaminophen (TYLENOL) 325 MG tablet, Take 650 mg by mouth every 6 (six) hours as needed for mild pain,  fever or headache.   meloxicam (MOBIC) 15 MG tablet, Take 1 tablet (15 mg total) by mouth daily.   Oxycodone HCl 10 MG TABS, Take 1-2 tablets (10-20 mg total) by mouth every 4 (four) hours as needed (pain).   Current Outpatient Medications (Other):    DULoxetine (CYMBALTA) 60 MG capsule, Take 60 mg by mouth at bedtime.   gabapentin (NEURONTIN) 300 MG capsule, Take  1 capsule (300 mg total) by mouth 3 (three) times daily.   tiZANidine (ZANAFLEX) 4 MG tablet, Take 4 mg by mouth 3 (three) times daily.    Reviewed prior external information including notes and imaging from  primary care provider As well as notes that were available from care everywhere and other healthcare systems.  Past medical history, social, surgical and family history all reviewed in electronic medical record.  No pertanent information unless stated regarding to the chief complaint.   Review of Systems:  No headache, visual changes, nausea, vomiting, diarrhea, constipation, dizziness, abdominal pain, skin rash, fevers, chills, night sweats, weight loss, swollen lymph nodes, body aches, joint swelling, chest pain, shortness of breath, mood changes. POSITIVE muscle aches, easy bruising  Objective  Blood pressure (!) 162/90, pulse 91, height 5' 11"$  (1.803 m), weight 254 lb (115.2 kg), SpO2 97 %.   General: No apparent distress alert and oriented x3 mood and affect normal, dressed appropriately.  HEENT: Pupils equal, extraocular movements intact  Respiratory: Patient's speak in full sentences and does not appear short of breath  Cardiovascular: No lower extremity edema, non tender, no erythema  Bilateral knees do have some crepitus noted.  Patient does have tenderness to palpation noted.  Instability noted with valgus and varus force patient has bruises that are all over his extremities that are significantly worse than usual.  After informed written and verbal consent, patient was seated on exam table. Right knee was  prepped with alcohol swab and utilizing anterolateral approach, patient's right knee space was injected with 60 mg per 3 mL of Durolane (sodium hyaluronate) in a prefilled syringe was injected easily into the knee through a 22-gauge needle..Patient tolerated the procedure well without immediate complications.  After informed written and verbal consent, patient was seated on exam table. Left knee was prepped with alcohol swab and utilizing anterolateral approach, patient's left knee space was injected with 60 mg per 3 mL of Durolane (sodium hyaluronate) in a prefilled syringe was injected easily into the knee through a 22-gauge needle..Patient tolerated the procedure well without immediate complications.     Impression and Recommendations:    The above documentation has been reviewed and is accurate and complete Lyndal Pulley, DO

## 2022-06-21 ENCOUNTER — Telehealth: Payer: Self-pay | Admitting: Physician Assistant

## 2022-06-21 NOTE — Telephone Encounter (Signed)
Scheduled appt per 2/15 referral. Pt is aware of appt date and time. Pt is aware to arrive 15 mins prior to appt time and to bring and updated insurance card. Pt is aware of appt location.   °

## 2022-06-23 ENCOUNTER — Telehealth: Payer: Self-pay

## 2022-06-23 NOTE — Telephone Encounter (Signed)
Contacted [facility] to schedule their annual wellness visit. Appointment made for 07/08/22.  Norton Blizzard, [place] (AAMA)  [facility] [phone]

## 2022-07-08 ENCOUNTER — Ambulatory Visit (INDEPENDENT_AMBULATORY_CARE_PROVIDER_SITE_OTHER): Payer: Medicare Other

## 2022-07-08 VITALS — Ht 71.0 in | Wt 250.0 lb

## 2022-07-08 DIAGNOSIS — Z0000 Encounter for general adult medical examination without abnormal findings: Secondary | ICD-10-CM | POA: Diagnosis not present

## 2022-07-08 DIAGNOSIS — Z122 Encounter for screening for malignant neoplasm of respiratory organs: Secondary | ICD-10-CM

## 2022-07-08 NOTE — Progress Notes (Signed)
I connected with  [name] on 07/08/22 by a audio enabled telemedicine application and verified that I am speaking with the correct person using two identifiers.  Patient Location: Home  Provider Location: Office/Clinic  I discussed the limitations of evaluation and management by telemedicine. The patient expressed understanding and agreed to proceed.  Subjective:   [name] is a 62 y.o. male who presents for Medicare Annual/Subsequent preventive examination.  Review of Systems     Cardiac Risk Factors include: advanced age (>98mn, >>38women);dyslipidemia;family history of premature cardiovascular disease;male gender;smoking/ tobacco exposure     Objective:    Today's Vitals   07/08/22 1018  Weight: 250 lb (113.4 kg)  Height: '5\' 11"'$  (1.803 m)  PainSc: 3   PainLoc: Back   Body mass index is 34.87 kg/m.     07/08/2022   10:21 AM 11/18/2020   11:33 AM 11/18/2020   11:00 AM 07/24/2020    9:43 AM 06/09/2020    6:20 PM 06/03/2020    8:30 AM 03/17/2020    9:55 PM  Advanced Directives  Does Patient Have a Medical Advance Directive? Yes  No Yes Yes Yes No  Type of AParamed[place]of [place]Grand Marais[place] will   Living will;Healthcare Power of Attorney Living will;Healthcare Power of Attorney Living will;Healthcare Power of Attorney   Does patient want to make changes to medical advance directive?    No - Patient declined No - Patient declined No - Patient declined   Copy of [place] Chart? No - copy requested   No - copy requested No - copy requested No - copy requested   Would patient like information on creating a medical advance directive?  No - Patient declined         Current Medications (verified) Outpatient Encounter Medications as of 07/08/2022  Medication Sig   acetaminophen (TYLENOL) 325 MG tablet Take 650 mg by mouth every 6 (six) hours as needed for mild pain, fever or headache.   albuterol (VENTOLIN HFA) 108 (90  Base) MCG/ACT inhaler Inhale 1-2 puffs into the lungs every 6 (six) hours as needed for wheezing or shortness of breath.   atorvastatin (LIPITOR) 10 MG tablet TAKE 1 TABLET BY MOUTH EVERY DAY   cetirizine (ZYRTEC) 10 MG tablet Take 1 tablet (10 mg total) by mouth daily.   DULoxetine (CYMBALTA) 60 MG capsule Take 60 mg by mouth at bedtime.   gabapentin (NEURONTIN) 300 MG capsule Take 1 capsule (300 mg total) by mouth 3 (three) times daily.   meloxicam (MOBIC) 15 MG tablet Take 1 tablet (15 mg total) by mouth daily.   Oxycodone HCl 10 MG TABS Take 1-2 tablets (10-20 mg total) by mouth every 4 (four) hours as needed (pain).   tiZANidine (ZANAFLEX) 4 MG tablet Take 4 mg by mouth 3 (three) times daily.    triamcinolone (NASACORT) 55 MCG/ACT AERO nasal inhaler Place 2 sprays into the nose daily.   No facility-administered encounter medications on file as of 07/08/2022.    Allergies (verified) Cefuroxime axetil   History: Past Medical History:  Diagnosis Date   Allergy    Anxiety    Arthritis    Back   Chronic back pain    Colon polyps    COPD (chronic obstructive pulmonary disease) (HCC)    Depression    GERD (gastroesophageal reflux disease)    every now and then   Headache    Hyperlipidemia 09/26/2015   Lumbar surgical wound fluid  collection    lumbar wound dehiscence   Pneumonia 2020   Past Surgical History:  Procedure Laterality Date   BACK SURGERY     COLONOSCOPY     COLONOSCOPY W/ BIOPSIES AND POLYPECTOMY     EYE SURGERY     IR US GUIDE BX ASP/DRAIN  11/18/2020   KNEE ARTHROSCOPY WITH MEDIAL MENISECTOMY Left 01/23/2016   Procedure: KNEE ARTHROSCOPY WITH MEDIAL MENISECTOMY;  Surgeon: Melrose Nakayama, MD;  Location: [facility];  Service: Orthopedics;  Laterality: Left;   LUMBAR LAMINECTOMY/DECOMPRESSION MICRODISCECTOMY N/A 06/09/2020   Procedure: Decompressive laminectomy Lumbarone -two;  Surgeon: Eustace Moore, MD;  Location: [facility];  Service: Neurosurgery;   Laterality: N/A;   LUMBAR WOUND DEBRIDEMENT N/A 03/21/2014   Procedure: LUMBAR WOUND DEBRIDEMENT;  Surgeon: Eustace Moore, MD;  Location: [facility];  Service: Neurosurgery;  Laterality: N/A;   MAXIMUM ACCESS (MAS)POSTERIOR LUMBAR INTERBODY FUSION (PLIF) 1 LEVEL N/A 02/14/2014   Procedure: FOR MAXIMUM ACCESS SURGERY POSTERIOR LUMBAR INTERBODY FUSION LUMBAR TWO-THREE;  Surgeon: Eustace Moore, MD;  Location: [facility];  Service: Neurosurgery;  Laterality: N/A;   NECK SURGERY     SHOULDER SURGERY     SPINE SURGERY     TONSILLECTOMY     Family History  Problem Relation Age of Onset   Colon cancer Father 39       pt thinks father had colon cancer   Stomach cancer Father    Hypertension Mother    Colon polyps Brother    Aneurysm Maternal Aunt 20       brain   Heart disease Maternal Aunt        d 72   Stomach cancer Paternal Uncle 63   Aneurysm Maternal Grandfather        stomach   Colon cancer Maternal Grandfather        thinks grandfather had colon cancer   Stomach cancer Paternal Grandfather 51   Depression Brother 78   Suicidality Brother    Stomach cancer Paternal Uncle 77   Esophageal cancer Neg Hx    Rectal cancer Neg Hx    Diabetes Neg Hx    Social History   Socioeconomic History   Marital status: Married    Spouse name: cindy   Number of children: Not on file   Years of education: Not on file   Highest education level: Not on file  Occupational History   Occupation: produce manager/ disability    Employer: [employer]  Tobacco Use   Smoking status: Every Day    Packs/day: 0.50    Years: 44.00    Total pack years: 22.00    Types: Cigarettes   Smokeless tobacco: Former    Types: Snuff  Vaping Use   Vaping Use: Never used  Substance and Sexual Activity   Alcohol use: Yes    Alcohol/week: 15.0 standard drinks of alcohol    Types: 15 Standard drinks or equivalent per week    Comment: social beer   Drug use: No   Sexual activity: Yes    Partners:  Female  Other Topics Concern   Not on file  Social History Narrative   Exercise--no   Social Determinants of Health   Financial Resource Strain: Low Risk  (07/08/2022)   Overall Financial Resource Strain (CARDIA)    Difficulty of Paying Living Expenses: Not hard at all  Food Insecurity: No Food Insecurity (07/08/2022)   Hunger Vital Sign    Worried About Running  Out of Food in the Last Year: Never true    Ran Out of Food in the Last Year: Never true  Transportation Needs: No Transportation Needs (07/08/2022)   PRAPARE - Hydrologist (Medical): No    Lack of Transportation (Non-Medical): No  Physical Activity: Sufficiently Active (07/08/2022)   Exercise Vital Sign    Days of Exercise per Week: 5 days    Minutes of Exercise per Session: 30 min  Stress: No Stress Concern Present (07/08/2022)   [place]    Feeling of Stress : Not at all  Social Connections: Unknown (07/08/2022)   Social Connection and Isolation Panel [NHANES]    Frequency of Communication with Friends and Family: More than three times a week    Frequency of Social Gatherings with Friends and Family: More than three times a week    Attends Religious Services: Patient refused    Marine scientist or Organizations: Patient refused    Attends Music therapist: Patient refused    Marital Status: Married    Tobacco Counseling Ready to quit: Not Answered Counseling given: Not Answered   Clinical Intake:  Pre-visit preparation completed: Yes  Pain : 0-10 Pain Score: 3  Pain Type: Chronic pain Pain Location: Back Pain Orientation: Mid, Lower     BMI - recorded: 34.87 Nutritional Status: BMI > 30  Obese Nutritional Risks: None Diabetes: No CBG done?: No Did pt. bring in CBG monitor from home?: No  How often do you need to have someone help you when you read instructions, pamphlets, or other written  materials from your doctor or pharmacy?: 1 - Never What is the last grade level you completed in school?: 8TH GRADE  Diabetic? No  Interpreter Needed?: No  Information entered by :: Lior Cartelli N. Eveleen Mcnear, LPN.   Activities of Daily Living    07/08/2022   10:28 AM  In your present state of health, do you have any difficulty performing the following activities:  Hearing? 0  Vision? 0  Difficulty concentrating or making decisions? 0  Walking or climbing stairs? 0  Dressing or bathing? 0  Doing errands, shopping? 0  Preparing Food and eating ? N  Using the Toilet? N  In the past six months, have you accidently leaked urine? N  Do you have problems with loss of bowel control? N  Managing your Medications? N  Managing your Finances? N  Housekeeping or managing your Housekeeping? N    Patient Care Team: [name], MD as PCP - General (Internal Medicine) Eustace Moore, MD as Consulting Physician (Neurosurgery) Illene Labrador, MD as Referring Physician (Neurosurgery)  Indicate any recent Medical Services you may have received from other than [facility] providers in the past year (date may be approximate).     Assessment:   This is a routine wellness examination for [name].  Hearing/Vision screen Hearing Screening - Comments:: Denies hearing difficulties   Vision Screening - Comments:: Wears rx glasses - up to date with routine eye exams with [facility]   Dietary issues and exercise activities discussed: Current Exercise Habits: Home exercise routine, Type of exercise: stretching;walking, Time (Minutes): 30, Frequency (Times/Week): 5, Weekly Exercise (Minutes/Week): 150, Intensity: Moderate, Exercise limited by: respiratory conditions(s);orthopedic condition(s)   Goals Addressed             This Visit's Progress    My goal for 2024 is to get down  to 200 pounds.        Depression Screen    07/08/2022   10:25 AM 03/17/2022    2:02 PM 06/16/2021    8:47  AM 02/24/2021    8:22 AM 02/24/2021    8:07 AM 12/18/2020    2:12 PM 10/21/2020    9:12 AM  PHQ 2/9 Scores  PHQ - 2 Score 3 3 0 0 0 0 0  PHQ- 9 Score '4 9   1  '$ 0    Fall Risk    07/08/2022   10:22 AM 03/17/2022    2:02 PM 06/16/2021    8:47 AM 02/24/2021    8:22 AM 12/18/2020    2:12 PM  Fall Risk   Falls in the past year? 1 0 1 0 0  Number falls in past yr: 1  0 0   Injury with Fall? 1  1 0   Risk for fall due to : History of fall(s);Impaired balance/gait;Orthopedic patient No Fall Risks History of fall(s)    Follow up Education provided;Falls prevention discussed Falls evaluation completed Falls evaluation completed  Falls evaluation completed    FALL RISK PREVENTION PERTAINING TO THE HOME:  Any stairs in or around the home? No  If so, are there any without handrails? No  Home free of loose throw rugs in walkways, pet beds, electrical cords, etc? Yes  Adequate lighting in your home to reduce risk of falls? Yes   ASSISTIVE DEVICES UTILIZED TO PREVENT FALLS:  Life alert? No  Use of a cane, walker or w/c? No  Grab bars in the bathroom? No  Shower chair or bench in shower? No  Elevated toilet seat or a handicapped toilet? Yes   TIMED UP AND GO:  Was the test performed? No . Telephonic Visit  Cognitive Function:        07/08/2022   10:28 AM  6CIT Screen  What Year? 0 points  What month? 0 points  What time? 0 points  Count back from 20 0 points  Months in reverse 0 points  Repeat phrase 0 points  Total Score 0 points    Immunizations Immunization History  Administered Date(s) Administered   Fluad Quad(high Dose 65+) 03/19/2021   Influenza Split 03/07/2012   Influenza,inj,Quad PF,6+ Mos 01/13/2017, 01/23/2019, 02/21/2020, 01/26/2021   Influenza-Unspecified 05/03/2014, 01/31/2018, 03/08/2022   Moderna Sars-Covid-2 Vaccination 07/20/2019, 08/20/2019, 04/12/2020, 03/08/2022   Pfizer Covid-19 Vaccine Bivalent Booster 62yr & up 01/26/2021   Pneumococcal  Polysaccharide-23 05/03/2008, 07/24/2020   Tdap 05/03/2008, 05/23/2018, 01/23/2019    TDAP status: Up to date  Flu Vaccine status: Up to date  Pneumococcal vaccine status: Up to date  Covid-19 vaccine status: Completed vaccines  Qualifies for Shingles Vaccine? Yes   Zostavax completed No   Shingrix Completed?: No.    Education has been provided regarding the importance of this vaccine. Patient has been advised to call insurance company to determine out of pocket expense if they have not yet received this vaccine. Advised may also receive vaccine at local pharmacy or Health Dept. Verbalized acceptance and understanding.  Screening Tests Health Maintenance  Topic Date Due   Zoster Vaccines- Shingrix (1 of 2) Never done   Lung Cancer Screening  10/16/2019   COVID-19 Vaccine (6 - 2023-24 season) 05/03/2022   Medicare Annual Wellness (AWV)  07/08/2023   COLONOSCOPY (Pts [phone] coverage will need to be confirmed)  06/04/2025   DTaP/Tdap/Td (4 - Td or Tdap) 01/22/2029   INFLUENZA VACCINE  Completed  Hepatitis C Screening  Completed   HIV Screening  Completed   HPV VACCINES  Aged Out    Health Maintenance  Health Maintenance Due  Topic Date Due   Zoster Vaccines- Shingrix (1 of 2) Never done   Lung Cancer Screening  10/16/2019   COVID-19 Vaccine (6 - 2023-24 season) 05/03/2022    Colorectal cancer screening: Type of screening: Colonoscopy. Completed 06/04/2022. Repeat every 3 years  Lung Cancer Screening: (Low Dose CT Chest recommended if Age 21-80 years, 30 pack-year currently smoking OR have quit w/in 15years.) does qualify.   Lung Cancer Screening Referral: yes, order placed 07/08/2022  Additional Screening:  Hepatitis C Screening: does qualify; Completed 11/20/2020  Vision Screening: Recommended annual ophthalmology exams for early detection of glaucoma and other disorders of the eye. Is the patient up to date with their annual eye exam?  Yes  Who is the  provider or what is the name of the office in which the patient attends annual eye exams? [facility] If pt is not established with a provider, would they like to be referred to a provider to establish care? No .   Dental Screening: Recommended annual dental exams for proper oral hygiene  Community Resource Referral / Chronic Care Management: CRR required this visit?  No   CCM required this visit?  No      Plan:     I have personally reviewed and noted the following in the patient's chart:   Medical and social history Use of alcohol, tobacco or illicit drugs  Current medications and supplements including opioid prescriptions. Patient is currently taking opioid prescriptions. Information provided to patient regarding non-opioid alternatives. Patient advised to discuss non-opioid treatment plan with their provider. Functional ability and status Nutritional status Physical activity Advanced directives List of other physicians Hospitalizations, surgeries, and ER visits in previous 12 months Vitals Screenings to include cognitive, depression, and falls Referrals and appointments  In addition, I have reviewed and discussed with patient certain preventive protocols, quality metrics, and best practice recommendations. A written personalized care plan for preventive services as well as general preventive health recommendations were provided to patient.     Sheral Flow, LPN   [id number]   Nurse Notes:  Normal cognitive status assessed by direct observation by this Nurse Health Advisor. No abnormalities found.

## 2022-07-08 NOTE — Patient Instructions (Addendum)
[name] , Thank you for taking time to come for your Medicare Wellness Visit. I appreciate your ongoing commitment to your health goals. Please review the following plan we discussed and let me know if I can assist you in the future.   These are the goals we discussed:  Goals      My goal for 2024 is to get down to 200 pounds.        This is a list of the screening recommended for you and due dates:  Health Maintenance  Topic Date Due   Zoster (Shingles) Vaccine (1 of 2) Never done   Screening for Lung Cancer  10/16/2019   COVID-19 Vaccine (6 - 2023-24 season) 05/03/2022   Medicare Annual Wellness Visit  07/08/2023   Colon Cancer Screening  06/04/2025   DTaP/Tdap/Td vaccine (4 - Td or Tdap) 01/22/2029   Flu Shot  Completed   Hepatitis C Screening: USPSTF Recommendation to screen - Ages 18-79 yo.  Completed   HIV Screening  Completed   HPV Vaccine  Aged Out    Advanced directives: Yes; Please bring a copy of your health care power of attorney and living will to the office at your convenience.  Conditions/risks identified: Yes  Next appointment: Follow up in one year for your annual wellness visit.  Preventive Care 40-64 Years, Male Preventive care refers to lifestyle choices and visits with your health care provider that can promote health and wellness. What does preventive care include? A yearly physical exam. This is also called an annual well check. Dental exams once or twice a year. Routine eye exams. Ask your health care provider how often you should have your eyes checked. Personal lifestyle choices, including: Daily care of your teeth and gums. Regular physical activity. Eating a healthy diet. Avoiding tobacco and drug use. Limiting alcohol use. Practicing safe sex. Taking low-dose aspirin every day starting at age 65. What happens during an annual well check? The services and screenings done by your health care provider during your annual well check will  depend on your age, overall health, lifestyle risk factors, and family history of disease. Counseling  Your health care provider may ask you questions about your: Alcohol use. Tobacco use. Drug use. Emotional well-being. Home and relationship well-being. Sexual activity. Eating habits. Work and work Statistician. Screening  You may have the following tests or measurements: Height, weight, and BMI. Blood pressure. Lipid and cholesterol levels. These may be checked every 5 years, or more frequently if you are over 81 years old. Skin check. Lung cancer screening. You may have this screening every year starting at age 14 if you have a 30-pack-year history of smoking and currently smoke or have quit within the past 15 years. Fecal occult blood test (FOBT) of the stool. You may have this test every year starting at age 72. Flexible sigmoidoscopy or colonoscopy. You may have a sigmoidoscopy every 5 years or a colonoscopy every 10 years starting at age 24. Prostate cancer screening. Recommendations will vary depending on your family history and other risks. Hepatitis C blood test. Hepatitis B blood test. Sexually transmitted disease (STD) testing. Diabetes screening. This is done by checking your blood sugar (glucose) after you have not eaten for a while (fasting). You may have this done every 1-3 years. Discuss your test results, treatment options, and if necessary, the need for more tests with your health care provider. Vaccines  Your health care provider may recommend certain vaccines, such as: Influenza vaccine. This  is recommended every year. Tetanus, diphtheria, and acellular pertussis (Tdap, Td) vaccine. You may need a Td booster every 10 years. Zoster vaccine. You may need this after age 35. Pneumococcal 13-valent conjugate (PCV13) vaccine. You may need this if you have certain conditions and have not been vaccinated. Pneumococcal polysaccharide (PPSV23) vaccine. You may need one or  two doses if you smoke cigarettes or if you have certain conditions. Talk to your health care provider about which screenings and vaccines you need and how often you need them. This information is not intended to replace advice given to you by your health care provider. Make sure you discuss any questions you have with your health care provider. Document Released: 05/16/2015 Document Revised: 01/07/2016 Document Reviewed: 02/18/2015 Elsevier Interactive Patient Education  2017 [employer] Prevention in the Home Falls can cause injuries. They can happen to people of all ages. There are many things you can do to make your home safe and to help prevent falls. What can I do on the outside of my home? Regularly fix the edges of walkways and driveways and fix any cracks. Remove anything that might make you trip as you walk through a door, such as a raised step or threshold. Trim any bushes or trees on the path to your home. Use bright outdoor lighting. Clear any walking paths of anything that might make someone trip, such as rocks or tools. Regularly check to see if handrails are loose or broken. Make sure that both sides of any steps have handrails. Any raised decks and porches should have guardrails on the edges. Have any leaves, snow, or ice cleared regularly. Use sand or salt on walking paths during winter. Clean up any spills in your garage right away. This includes oil or grease spills. What can I do in the bathroom? Use night lights. Install grab bars by the toilet and in the tub and shower. Do not use towel bars as grab bars. Use non-skid mats or decals in the tub or shower. If you need to sit down in the shower, use a plastic, non-slip stool. Keep the floor dry. Clean up any water that spills on the floor as soon as it happens. Remove soap buildup in the tub or shower regularly. Attach bath mats securely with double-sided non-slip rug tape. Do not have throw rugs and other  things on the floor that can make you trip. What can I do in the bedroom? Use night lights. Make sure that you have a light by your bed that is easy to reach. Do not use any sheets or blankets that are too big for your bed. They should not hang down onto the floor. Have a firm chair that has side arms. You can use this for support while you get dressed. Do not have throw rugs and other things on the floor that can make you trip. What can I do in the kitchen? Clean up any spills right away. Avoid walking on wet floors. Keep items that you use a lot in easy-to-reach places. If you need to reach something above you, use a strong step stool that has a grab bar. Keep electrical cords out of the way. Do not use floor polish or wax that makes floors slippery. If you must use wax, use non-skid floor wax. Do not have throw rugs and other things on the floor that can make you trip. What can I do with my stairs? Do not leave any items on the stairs. Make  sure that there are handrails on both sides of the stairs and use them. Fix handrails that are broken or loose. Make sure that handrails are as long as the stairways. Check any carpeting to make sure that it is firmly attached to the stairs. Fix any carpet that is loose or worn. Avoid having throw rugs at the top or bottom of the stairs. If you do have throw rugs, attach them to the floor with carpet tape. Make sure that you have a light switch at the top of the stairs and the bottom of the stairs. If you do not have them, ask someone to add them for you. What else can I do to help prevent falls? Wear shoes that: Do not have high heels. Have rubber bottoms. Are comfortable and fit you well. Are closed at the toe. Do not wear sandals. If you use a stepladder: Make sure that it is fully opened. Do not climb a closed stepladder. Make sure that both sides of the stepladder are locked into place. Ask someone to hold it for you, if possible. Clearly  mark and make sure that you can see: Any grab bars or handrails. First and last steps. Where the edge of each step is. Use tools that help you move around (mobility aids) if they are needed. These include: Canes. Walkers. Scooters. Crutches. Turn on the lights when you go into a dark area. Replace any light bulbs as soon as they burn out. Set up your furniture so you have a clear path. Avoid moving your furniture around. If any of your floors are uneven, fix them. If there are any pets around you, be aware of where they are. Review your medicines with your doctor. Some medicines can make you feel dizzy. This can increase your chance of falling. Ask your doctor what other things that you can do to help prevent falls. This information is not intended to replace advice given to you by your health care provider. Make sure you discuss any questions you have with your health care provider. Document Released: 02/13/2009 Document Revised: 09/25/2015 Document Reviewed: 05/24/2014 Elsevier Interactive Patient Education  2017 [employer].

## 2022-07-09 ENCOUNTER — Ambulatory Visit (INDEPENDENT_AMBULATORY_CARE_PROVIDER_SITE_OTHER): Payer: Medicare Other | Admitting: Psychiatry

## 2022-07-09 DIAGNOSIS — Z62819 Personal history of unspecified abuse in childhood: Secondary | ICD-10-CM

## 2022-07-09 DIAGNOSIS — F1010 Alcohol abuse, uncomplicated: Secondary | ICD-10-CM

## 2022-07-09 DIAGNOSIS — Z636 Dependent relative needing care at home: Secondary | ICD-10-CM

## 2022-07-09 DIAGNOSIS — F331 Major depressive disorder, recurrent, moderate: Secondary | ICD-10-CM | POA: Diagnosis not present

## 2022-07-09 DIAGNOSIS — G8929 Other chronic pain: Secondary | ICD-10-CM

## 2022-07-09 DIAGNOSIS — Z630 Problems in relationship with spouse or partner: Secondary | ICD-10-CM | POA: Diagnosis not present
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
## 2022-07-12 NOTE — Progress Notes (Unsigned)
[place] Telephone:([phone]   Fax:([phone] [place] NOTE  Patient Care Team: [name], MD as PCP - General (Internal Medicine) Eustace Moore, MD as Consulting Physician (Neurosurgery) Illene Labrador, MD as Referring Physician (Neurosurgery)   CHIEF COMPLAINTS/PURPOSE OF CONSULTATION:  Easy bruising  HISTORY OF PRESENTING ILLNESS:  [name] 62 y.o. male with medical history significant for COPD, GERD, hyperlipidemia, arthritis and headaches presents to the hematology clinic for evaluation for easy bruising.  He is unaccompanied for this visit.  Mr. Meng reports that he has noticed easy bruising for the last 4 to 5 years that has progressively worsened.  He reports the bruising is predominantly in his arms and legs.  He adds that his skin is thin and he has noticed that he bleeds easily.  He denies any signs of active bleeding.  Patient otherwise feels good.  He reports his energy levels are fairly stable.  He is able to complete all his daily activities on his own.  He denies any appetite or weight changes.  He denies nausea, vomiting or abdominal pain.  His bowel habits are unchanged without any recurring episodes of diarrhea or constipation.  He denies fevers, chills, night sweats, shortness of breath, chest pain or cough.  He has no other complaints.  Rest of the 10 point ROS is below.  MEDICAL HISTORY:  Past Medical History:  Diagnosis Date   Allergy    Anxiety    Arthritis    Back   Chronic back pain    Colon polyps    COPD (chronic obstructive pulmonary disease) (HCC)    Depression    GERD (gastroesophageal reflux disease)    every now and then   Headache    Hyperlipidemia 09/26/2015   Lumbar surgical wound fluid collection    lumbar wound dehiscence   Pneumonia 2020    SURGICAL HISTORY: Past Surgical History:  Procedure Laterality Date   BACK SURGERY     COLONOSCOPY     COLONOSCOPY W/ BIOPSIES AND  POLYPECTOMY     EYE SURGERY     IR US GUIDE BX ASP/DRAIN  11/18/2020   KNEE ARTHROSCOPY WITH MEDIAL MENISECTOMY Left 01/23/2016   Procedure: KNEE ARTHROSCOPY WITH MEDIAL MENISECTOMY;  Surgeon: Melrose Nakayama, MD;  Location: [facility];  Service: Orthopedics;  Laterality: Left;   LUMBAR LAMINECTOMY/DECOMPRESSION MICRODISCECTOMY N/A 06/09/2020   Procedure: Decompressive laminectomy Lumbarone -two;  Surgeon: Eustace Moore, MD;  Location: [facility];  Service: Neurosurgery;  Laterality: N/A;   LUMBAR WOUND DEBRIDEMENT N/A 03/21/2014   Procedure: LUMBAR WOUND DEBRIDEMENT;  Surgeon: Eustace Moore, MD;  Location: [facility];  Service: Neurosurgery;  Laterality: N/A;   MAXIMUM ACCESS (MAS)POSTERIOR LUMBAR INTERBODY FUSION (PLIF) 1 LEVEL N/A 02/14/2014   Procedure: FOR MAXIMUM ACCESS SURGERY POSTERIOR LUMBAR INTERBODY FUSION LUMBAR TWO-THREE;  Surgeon: Eustace Moore, MD;  Location: [facility];  Service: Neurosurgery;  Laterality: N/A;   NECK SURGERY     SHOULDER SURGERY     SPINE SURGERY     TONSILLECTOMY      SOCIAL HISTORY: Social History   Socioeconomic History   Marital status: Married    Spouse name: cindy   Number of children: Not on file   Years of education: Not on file   Highest education level: Not on file  Occupational History   Occupation: produce manager/ disability    Employer: [employer]  Tobacco Use   Smoking status:  Every Day    Packs/day: 0.50    Years: 44.00    Additional pack years: 0.00    Total pack years: 22.00    Types: Cigarettes   Smokeless tobacco: Former    Types: Snuff  Vaping Use   Vaping Use: Never used  Substance and Sexual Activity   Alcohol use: Yes    Alcohol/week: 8.0 standard drinks of alcohol    Types: 8 Standard drinks or equivalent per week    Comment: social beer   Drug use: No   Sexual activity: Yes    Partners: Female  Other Topics Concern   Not on file  Social History Narrative   Exercise--no   Social Determinants  of Health   Financial Resource Strain: Low Risk  (07/08/2022)   Overall Financial Resource Strain (CARDIA)    Difficulty of Paying Living Expenses: Not hard at all  Food Insecurity: No Food Insecurity (07/13/2022)   Hunger Vital Sign    Worried About Running Out of Food in the Last Year: Never true    [place] in the Last Year: Never true  Transportation Needs: No Transportation Needs (07/13/2022)   PRAPARE - Hydrologist (Medical): No    Lack of Transportation (Non-Medical): No  Physical Activity: Sufficiently Active (07/08/2022)   Exercise Vital Sign    Days of Exercise per Week: 5 days    Minutes of Exercise per Session: 30 min  Stress: No Stress Concern Present (07/08/2022)   [place]    Feeling of Stress : Not at all  Social Connections: Unknown (07/08/2022)   Social Connection and Isolation Panel [NHANES]    Frequency of Communication with Friends and Family: More than three times a week    Frequency of Social Gatherings with Friends and Family: More than three times a week    Attends Religious Services: Patient declined    Marine scientist or Organizations: Patient declined    Attends Archivist Meetings: Patient declined    Marital Status: Married  Human resources officer Violence: Not At Risk (07/13/2022)   Humiliation, Afraid, Rape, and Kick questionnaire    Fear of Current or Ex-Partner: No    Emotionally Abused: No    Physically Abused: No    Sexually Abused: No    FAMILY HISTORY: Family History  Problem Relation Age of Onset   Colon cancer Father 66       pt thinks father had colon cancer   Stomach cancer Father    Hypertension Mother    Colon polyps Brother    Aneurysm Maternal Aunt 58       brain   Heart disease Maternal Aunt        d 64   Stomach cancer Paternal Uncle 57   Aneurysm Maternal Grandfather        stomach   Colon cancer Maternal  Grandfather        thinks grandfather had colon cancer   Stomach cancer Paternal Grandfather 2   Depression Brother 61   Suicidality Brother    Stomach cancer Paternal Uncle 72   Esophageal cancer Neg Hx    Rectal cancer Neg Hx    Diabetes Neg Hx     ALLERGIES:  is allergic to cefuroxime axetil.  MEDICATIONS:  Current Outpatient Medications  Medication Sig Dispense Refill   acetaminophen (TYLENOL) 325 MG tablet Take 650 mg by mouth every 6 (six) hours as  needed for mild pain, fever or headache.     ascorbic acid (VITAMIN C) 500 MG tablet Take 500 mg by mouth daily.     atorvastatin (LIPITOR) 10 MG tablet TAKE 1 TABLET BY MOUTH EVERY DAY 90 tablet 3   Cholecalciferol (VITAMIN D-3) 25 MCG (1000 UT) CAPS Take by mouth daily.     cyanocobalamin (VITAMIN B12) 1000 MCG tablet Take 1,000 mcg by mouth daily.     DULoxetine (CYMBALTA) 60 MG capsule Take 60 mg by mouth at bedtime.  2   folic acid (FOLVITE) Q000111Q MCG tablet Take 400 mcg by mouth daily.     gabapentin (NEURONTIN) 300 MG capsule Take 1 capsule (300 mg total) by mouth 3 (three) times daily. 90 capsule 3   meloxicam (MOBIC) 15 MG tablet Take 1 tablet (15 mg total) by mouth daily. 90 tablet 3   Multiple Vitamins-Minerals (CENTRUM SILVER 50+MEN) TABS Take by mouth.     Oxycodone HCl 10 MG TABS Take 1-2 tablets (10-20 mg total) by mouth every 4 (four) hours as needed (pain). 40 tablet 0   tiZANidine (ZANAFLEX) 4 MG tablet Take 4 mg by mouth 3 (three) times daily.   2   vitamin E 180 MG (400 UNITS) capsule Take 400 Units by mouth daily.     albuterol (VENTOLIN HFA) 108 (90 Base) MCG/ACT inhaler Inhale 1-2 puffs into the lungs every 6 (six) hours as needed for wheezing or shortness of breath. (Patient not taking: Reported on 07/13/2022) 8.5 each 11   cetirizine (ZYRTEC) 10 MG tablet Take 1 tablet (10 mg total) by mouth daily. 30 tablet 11   triamcinolone (NASACORT) 55 MCG/ACT AERO nasal inhaler Place 2 sprays into the nose daily. (Patient  not taking: Reported on 07/13/2022) 1 each 12   No current facility-administered medications for this visit.    REVIEW OF SYSTEMS:   Constitutional: ( - ) fevers, ( - )  chills , ( - ) night sweats Eyes: ( - ) blurriness of vision, ( - ) double vision, ( - ) watery eyes Ears, nose, mouth, throat, and face: ( - ) mucositis, ( - ) sore throat Respiratory: ( - ) cough, ( - ) dyspnea, ( - ) wheezes Cardiovascular: ( - ) palpitation, ( - ) chest discomfort, ( - ) lower extremity swelling Gastrointestinal:  ( - ) nausea, ( - ) heartburn, ( - ) change in bowel habits Skin: ( - ) abnormal skin rashes Lymphatics: ( - ) new lymphadenopathy, ( + ) easy bruising Neurological: ( - ) numbness, ( - ) tingling, ( - ) new weaknesses Behavioral/Psych: ( - ) mood change, ( - ) new changes  All other systems were reviewed with the patient and are negative.  PHYSICAL EXAMINATION: ECOG PERFORMANCE STATUS: 1 - Symptomatic but completely ambulatory  Vitals:   07/13/22 1109  BP: (!) 172/95  Pulse: 96  Resp: 17  Temp: 98.2 F (36.8 C)  SpO2: 100%   Filed Weights   07/13/22 1109  Weight: 259 lb 1.6 oz (117.5 kg)    GENERAL: well appearing male in NAD  SKIN: skin color, texture, turgor are normal. Diffuse bruising noted in both upper and lower extremities. EYES: conjunctiva are pink and non-injected, sclera clear OROPHARYNX: no exudate, no erythema; lips, buccal mucosa, and tongue normal  NECK: supple, non-tender LYMPH:  no palpable lymphadenopathy in the cervical or supraclavicular lymph nodes.  LUNGS: clear to auscultation and percussion with normal breathing effort HEART: regular rate & rhythm  and no murmurs and no lower extremity edema Musculoskeletal: no cyanosis of digits and no clubbing  PSYCH: alert & oriented x 3, fluent speech NEURO: no focal motor/sensory deficits  LABORATORY DATA:  I have reviewed the data as listed    Latest Ref Rng & Units 07/13/2022   12:20 PM 03/03/2022    9:44  AM 06/16/2021    8:45 AM  CBC  WBC 4.0 - 10.5 K/uL 12.7  10.6  12.7   Hemoglobin 13.0 - 17.0 g/dL 15.9  14.7  15.7   Hematocrit 39.0 - 52.0 % 47.3  44.4  47.0   Platelets 150 - 400 K/uL 302  312.0  313        Latest Ref Rng & Units 07/13/2022   12:20 PM 03/03/2022    9:44 AM 06/16/2021    8:45 AM  CMP  Glucose 70 - 99 mg/dL 93  115  107   BUN 6 - 20 mg/dL 13  19  21    Creatinine 0.61 - 1.24 mg/dL 0.81  0.76  0.81   Sodium 135 - 145 mmol/L 139  138  138   Potassium 3.5 - 5.1 mmol/L 4.1  4.4  5.3   Chloride 98 - 111 mmol/L 103  101  103   CO2 22 - 32 mmol/L 30  31  28    Calcium 8.9 - 10.3 mg/dL 10.0  9.4  9.9   Total Protein 6.5 - 8.1 g/dL 7.7  6.7    Total Bilirubin 0.3 - 1.2 mg/dL 0.7  0.8    Alkaline Phos 38 - 126 U/L 93  86    AST 15 - 41 U/L 17  18    ALT 0 - 44 U/L 23  23         ASSESSMENT & PLAN DARRETT PITCHER is a 62 y.o. male who presents to the hematology clinic for initial evaluation of easy bruising.   #Easy bruising: --Discussed possible etiologies including medication induced, bleeding disorder, skin disorder and underlying bone marrow disorder --Patient is on meloxicam and cymbalta, which can increase risk of bleeding/bruising.  --Patient will proceed with labs today to check CBC, CMP, PT/INR, PTT, SPEP/IFE, sFLC, Platelet function assay, von willebrand panel and peripheral smear.  -minimize ETOH intake --RTC once workup is complete.   Orders Placed This Encounter  Procedures   CBC with Differential ([facility] Only)    Standing Status:   Future    Number of Occurrences:   1    Standing Expiration Date:   07/12/2023   CMP ([facility] only)    Standing Status:   Future    Number of Occurrences:   1    Standing Expiration Date:   07/12/2023   Protime-INR    Standing Status:   Future    Number of Occurrences:   1    Standing Expiration Date:   07/12/2023   APTT    Standing Status:   Future    Number of Occurrences:   1    Standing Expiration  Date:   07/12/2023   Technologist smear review    Order Specific Question:   Clinical information:    Answer:   easy bruising   Von Willebrand panel    Standing Status:   Future    Number of Occurrences:   1    Standing Expiration Date:   07/13/2023   Platelet function assay    Standing Status:   Future    Number of Occurrences:  1    Standing Expiration Date:   07/13/2023   Multiple Myeloma Panel (SPEP&IFE w/QIG)    Standing Status:   Future    Number of Occurrences:   1    Standing Expiration Date:   07/13/2023   Kappa/lambda light chains    Standing Status:   Future    Number of Occurrences:   1    Standing Expiration Date:   07/13/2023    All questions were answered. The patient knows to call the clinic with any problems, questions or concerns.  I have spent a total of 60 minutes minutes of face-to-face and non-face-to-face time, preparing to see the patient, obtaining and/or reviewing separately obtained history, performing a medically appropriate examination, counseling and educating the patient, ordering tests/procedures, documenting clinical information in the electronic health record, and care coordination.   Dede Query, PA-C Department of Hematology/Oncology [place] [facility] Phone: [phone]  Patient was seen with Dr. Chryl Heck  .Patient was Personally and independently interviewed, examined and relevant elements of the history of present illness were reviewed in details and an assessment and plan was created. All elements of the patient's history of present illness , assessment and plan were discussed in details with Dede Query, PA-C. The above documentation reflects our combined findings assessment and plan.   Sullivan Lone MD MS

## 2022-07-13 ENCOUNTER — Encounter: Payer: Self-pay | Admitting: Physician Assistant

## 2022-07-13 ENCOUNTER — Other Ambulatory Visit: Payer: Self-pay

## 2022-07-13 ENCOUNTER — Inpatient Hospital Stay: Payer: Medicare Other | Attending: Physician Assistant | Admitting: Physician Assistant

## 2022-07-13 ENCOUNTER — Inpatient Hospital Stay: Payer: Medicare Other

## 2022-07-13 VITALS — BP 172/95 | HR 96 | Temp 98.2°F | Resp 17 | Wt 259.1 lb

## 2022-07-13 DIAGNOSIS — R233 Spontaneous ecchymoses: Secondary | ICD-10-CM | POA: Diagnosis not present

## 2022-07-13 DIAGNOSIS — Z800 Family history of malignant neoplasm of digestive organs: Secondary | ICD-10-CM | POA: Diagnosis not present

## 2022-07-13 DIAGNOSIS — E785 Hyperlipidemia, unspecified: Secondary | ICD-10-CM | POA: Insufficient documentation

## 2022-07-13 DIAGNOSIS — F17210 Nicotine dependence, cigarettes, uncomplicated: Secondary | ICD-10-CM | POA: Diagnosis not present

## 2022-07-13 DIAGNOSIS — K219 Gastro-esophageal reflux disease without esophagitis: Secondary | ICD-10-CM | POA: Insufficient documentation

## 2022-07-13 DIAGNOSIS — J449 Chronic obstructive pulmonary disease, unspecified: Secondary | ICD-10-CM | POA: Diagnosis not present

## 2022-07-13 DIAGNOSIS — M1990 Unspecified osteoarthritis, unspecified site: Secondary | ICD-10-CM | POA: Insufficient documentation

## 2022-07-13 LAB — CBC WITH DIFFERENTIAL (CANCER CENTER ONLY)
Abs Immature Granulocytes: 0.14 10*3/uL — ABNORMAL HIGH (ref 0.00–0.07)
Basophils Absolute: 0.1 10*3/uL (ref 0.0–0.1)
Basophils Relative: 1 %
Eosinophils Absolute: 0.1 10*3/uL (ref 0.0–0.5)
Eosinophils Relative: 1 %
HCT: 47.3 % (ref 39.0–52.0)
Hemoglobin: 15.9 g/dL (ref 13.0–17.0)
Immature Granulocytes: 1 %
Lymphocytes Relative: 19 %
Lymphs Abs: 2.5 10*3/uL (ref 0.7–4.0)
MCH: 32 pg (ref 26.0–34.0)
MCHC: 33.6 g/dL (ref 30.0–36.0)
MCV: 95.2 fL (ref 80.0–100.0)
Monocytes Absolute: 1 10*3/uL (ref 0.1–1.0)
Monocytes Relative: 8 %
Neutro Abs: 8.9 10*3/uL — ABNORMAL HIGH (ref 1.7–7.7)
Neutrophils Relative %: 70 %
Platelet Count: 302 10*3/uL (ref 150–400)
RBC: 4.97 MIL/uL (ref 4.22–5.81)
RDW: 14.1 % (ref 11.5–15.5)
WBC Count: 12.7 10*3/uL — ABNORMAL HIGH (ref 4.0–10.5)
nRBC: 0 % (ref 0.0–0.2)

## 2022-07-13 LAB — PROTIME-INR
INR: 0.9 (ref 0.8–1.2)
Prothrombin Time: 12.3 seconds (ref 11.4–15.2)

## 2022-07-13 LAB — APTT: aPTT: 26 seconds (ref 24–36)

## 2022-07-13 LAB — PLATELET FUNCTION ASSAY
Collagen / ADP: 85 seconds (ref 0–118)
Collagen / Epinephrine: 215 seconds — ABNORMAL HIGH (ref 0–193)

## 2022-07-13 LAB — CMP (CANCER CENTER ONLY)
ALT: 23 U/L (ref 0–44)
AST: 17 U/L (ref 15–41)
Albumin: 4.8 g/dL (ref 3.5–5.0)
Alkaline Phosphatase: 93 U/L (ref 38–126)
Anion gap: 6 (ref 5–15)
BUN: 13 mg/dL (ref 6–20)
CO2: 30 mmol/L (ref 22–32)
Calcium: 10 mg/dL (ref 8.9–10.3)
Chloride: 103 mmol/L (ref 98–111)
Creatinine: 0.81 mg/dL (ref 0.61–1.24)
GFR, Estimated: >60 mL/min (ref >60)
Glucose, Bld: 93 mg/dL (ref 70–99)
Potassium: 4.1 mmol/L (ref 3.5–5.1)
Sodium: 139 mmol/L (ref 135–145)
Total Bilirubin: 0.7 mg/dL (ref 0.3–1.2)
Total Protein: 7.7 g/dL (ref 6.5–8.1)

## 2022-07-13 LAB — TECHNOLOGIST SMEAR REVIEW: Plt Morphology: NORMAL

## 2022-07-14 LAB — COAG STUDIES INTERP REPORT

## 2022-07-14 LAB — VON WILLEBRAND PANEL
Coagulation Factor VIII: 168 % — ABNORMAL HIGH (ref 56–140)
Ristocetin Co-factor, Plasma: 125 % (ref 50–200)
Von Willebrand Antigen, Plasma: 173 % (ref 50–200)

## 2022-07-14 LAB — KAPPA/LAMBDA LIGHT CHAINS
Kappa free light chain: 15.1 mg/L (ref 3.3–19.4)
Kappa, lambda light chain ratio: 1.36 (ref 0.26–1.65)
Lambda free light chains: 11.1 mg/L (ref 5.7–26.3)

## 2022-07-19 LAB — MULTIPLE MYELOMA PANEL, SERUM
Albumin SerPl Elph-Mcnc: 4.1 g/dL (ref 2.9–4.4)
Albumin/Glob SerPl: 1.5 (ref 0.7–1.7)
Alpha 1: 0.3 g/dL (ref 0.0–0.4)
Alpha2 Glob SerPl Elph-Mcnc: 0.9 g/dL (ref 0.4–1.0)
B-Globulin SerPl Elph-Mcnc: 1.1 g/dL (ref 0.7–1.3)
Gamma Glob SerPl Elph-Mcnc: 0.7 g/dL (ref 0.4–1.8)
Globulin, Total: 2.9 g/dL (ref 2.2–3.9)
IgA: 121 mg/dL (ref 90–386)
IgG (Immunoglobin G), Serum: 657 mg/dL (ref 603–1613)
IgM (Immunoglobulin M), Srm: 90 mg/dL (ref 20–172)
Total Protein ELP: 7 g/dL (ref 6.0–8.5)

## 2022-07-20 ENCOUNTER — Ambulatory Visit: Payer: Medicare Other | Admitting: Psychiatry

## 2022-07-22 ENCOUNTER — Ambulatory Visit
Admission: EM | Admit: 2022-07-22 | Discharge: 2022-07-22 | Disposition: A | Discharge status: Home / Self Care | Payer: Medicare Other | Attending: Internal Medicine | Admitting: Internal Medicine

## 2022-07-22 ENCOUNTER — Telehealth: Payer: Self-pay | Admitting: Physician Assistant

## 2022-07-22 DIAGNOSIS — J441 Chronic obstructive pulmonary disease with (acute) exacerbation: Secondary | ICD-10-CM | POA: Diagnosis not present

## 2022-07-22 MED ORDER — AZITHROMYCIN 250 MG PO TABS: 250.0000 mg | ORAL_TABLET | Freq: Every day | ORAL | 0 refills | Status: DC

## 2022-07-22 MED ORDER — ALBUTEROL SULFATE HFA 108 (90 BASE) MCG/ACT IN AERS: 1.0000 | INHALATION_SPRAY | Freq: Four times a day (QID) | RESPIRATORY_TRACT | 0 refills | Status: DC | PRN

## 2022-07-22 MED ORDER — DEXAMETHASONE SODIUM PHOSPHATE 10 MG/ML IJ SOLN
10.0000 mg | Freq: Once | INTRAMUSCULAR | Status: AC
  Administered 2022-07-22: 10 mg via INTRAMUSCULAR

## 2022-07-22 MED ORDER — ALBUTEROL SULFATE HFA 108 (90 BASE) MCG/ACT IN AERS
2.0000 | INHALATION_SPRAY | Freq: Once | RESPIRATORY_TRACT | Status: AC
  Administered 2022-07-22: 2 via RESPIRATORY_TRACT

## 2022-07-22 MED ORDER — BENZONATATE 100 MG PO CAPS: 100.0000 mg | ORAL_CAPSULE | Freq: Three times a day (TID) | ORAL | 0 refills | Status: DC

## 2022-07-22 NOTE — ED Triage Notes (Signed)
Pt presents with productive cough with colored mucous and bilateral ear fullness and pain X 3 days.

## 2022-07-22 NOTE — Telephone Encounter (Signed)
I called Mr. [name] to review the lab results from 07/13/2022. CBC showed leukocytosis that was neutrophil predominant. Patient is a chronic daily smoker which is the likely etiology. Encouraged to smoking cessation. He has no infectious symptoms including fevers, chills or cough.   Remaining workup was negative including  normal PT/INR, PTT, SPEP/IFE, serum free light chain. No evidence of von willebrand disease. Peripheral smear showed normal WBC/RBC/PLT morphology.    Platelet function assay suggests drug induced as underlying etiology. Dr.  Limbo recommends to wean off meloxicam. Additionally, recommend to cut down or stop drinking as well.   No underlying bleeding or hematologic disorder was identified so patient can follow up with PCP moving forward.   Patient expressed understanding of the plan provided.

## 2022-07-22 NOTE — Discharge Instructions (Addendum)
I suspect that your symptoms are due to a COPD exacerbation.  Take azithromycin as prescribed (2 pills today, then 1 pill until finished).  You may take Tessalon Perles every 8 hours as needed for cough.  I gave you a shot of steroid in the clinic to help with your breathing and inflammation.  Please use albuterol every 4-6 hours as needed for cough, shortness of breath, and wheeze.  I heard some wheezing to your chest in the clinic and gave you some albuterol here.   If you develop any new or worsening symptoms or do not improve in the next 2 to 3 days, please return.  If your symptoms are severe, please go to the emergency room.  Follow-up with your primary care provider for further evaluation and management of your symptoms as well as ongoing wellness visits.  I hope you feel better!

## 2022-07-22 NOTE — ED Provider Notes (Signed)
[name]    CSN: [id number] Arrival date & time: 07/22/22  1152      History   Chief Complaint Chief Complaint  Patient presents with   Cough   Ear Fullness    HPI [name] is a 62 y.o. male.   Patient presents to urgent care for evaluation of productive cough with yellow/green phlegm and bilateral ear pressure for the last 3 days. He is a current everyday cigarette smoker with history of COPD. Denies orthopnea and leg swelling. No fever, chills, sore throat, headache, dizziness, nasal congestion, vision changes, neck pain, chest pain, shortness of breath, heart palpitations, or decreased appetite. No recent antibiotic or steroid use. No recent known sick contacts with similar symptoms. He has not attempted use of any over the counter medications before coming to urgent care.   Cough Ear Fullness    Past Medical History:  Diagnosis Date   Allergy    Anxiety    Arthritis    Back   Chronic back pain    Colon polyps    COPD (chronic obstructive pulmonary disease) (HCC)    Depression    GERD (gastroesophageal reflux disease)    every now and then   Headache    Hyperlipidemia 09/26/2015   Lumbar surgical wound fluid collection    lumbar wound dehiscence   Pneumonia 2020    Patient Active Problem List   Diagnosis Date Noted   Delayed wound healing 03/17/2022   Allergic rhinitis 03/03/2021   Protein-calorie malnutrition, severe [id number]   Cephalic vein thrombosis    Osteomyelitis, jaw acute 11/18/2020   Cough 11/18/2020   COPD with acute bronchitis ([place]) 11/18/2020   Abscess of mandible 11/18/2020   Leukocytosis 11/18/2020   Chronic obstructive pulmonary disease (HCC)    Neck swelling 11/17/2020   Neck pain on left side 11/17/2020   Left-sided face pain 11/17/2020   Swelling of left side of face 11/17/2020   Paresthesia of foot 10/21/2020   Infected tick bite of buttock 10/21/2020   Loss of transverse plantar arch 10/02/2020    Trochanteric bursitis of both hips 09/23/2020   Hallux rigidus of both feet 08/21/2020   Disc displacement, lumbar 07/09/2020   Pseudoarthrosis of lumbar spine 04/10/2020   Degenerative arthritis of knee, bilateral 02/26/2020   Rash 02/21/2020   Other chronic pain 02/12/2020   Throat pain in adult    Peritonsillar abscess    Paresthesia of skin 01/08/2020   Numbness and tingling of foot 01/08/2020   Trochanteric bursitis, left hip 09/25/2019   S/P lumbar fusion 09/10/2019   Elevated blood-pressure reading, without diagnosis of hypertension 08/20/2019   Neck pain 05/17/2019   Greater trochanteric pain syndrome 04/03/2019   Fibromatosis of plantar fascia 11/29/2018   Pronation deformity of both feet 11/01/2018   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   Plantar fasciitis 09/27/2018   Left hip pain 09/27/2018   Microhematuria 09/27/2018   Chronic cough 09/27/2018   Alcohol abuse 09/27/2018   Numbness of upper limb 05/15/2018   Low testosterone 12/10/2015   Polymyalgia ([place]) 11/11/2015   Easy bruising 10/30/2015   Bursitis of right shoulder 09/30/2015   Hyperglycemia 09/26/2015   Hyperlipidemia 09/26/2015   History of colonic polyps 09/26/2015   Acute medial meniscal tear 09/09/2015   Left knee pain 08/28/2015   Disorder of sacroiliac joint 06/05/2015   Shoulder pain 02/06/2015   Status post lumbar spine operation 04/02/2014   Dehiscence of surgical wound 03/18/2014   S/P lumbar spinal fusion 02/14/2014  Low back pain 02/11/2014   Hip pain 11/13/2013   Body mass index (BMI) 38.0-38.9, adult 07/12/2013   OA (osteoarthritis) 03/07/2012   Fatigue 03/07/2012   Encounter for well adult exam with abnormal findings 09/03/2010   Smoker 09/03/2010    Past Surgical History:  Procedure Laterality Date   BACK SURGERY     COLONOSCOPY     COLONOSCOPY W/ BIOPSIES AND POLYPECTOMY     EYE SURGERY     IR US GUIDE BX ASP/DRAIN  11/18/2020   KNEE ARTHROSCOPY WITH MEDIAL MENISECTOMY Left  01/23/2016   Procedure: KNEE ARTHROSCOPY WITH MEDIAL MENISECTOMY;  Surgeon: Melrose Nakayama, MD;  Location: [facility];  Service: Orthopedics;  Laterality: Left;   LUMBAR LAMINECTOMY/DECOMPRESSION MICRODISCECTOMY N/A 06/09/2020   Procedure: Decompressive laminectomy Lumbarone -two;  Surgeon: Eustace Moore, MD;  Location: [facility];  Service: Neurosurgery;  Laterality: N/A;   LUMBAR WOUND DEBRIDEMENT N/A 03/21/2014   Procedure: LUMBAR WOUND DEBRIDEMENT;  Surgeon: Eustace Moore, MD;  Location: [facility];  Service: Neurosurgery;  Laterality: N/A;   MAXIMUM ACCESS (MAS)POSTERIOR LUMBAR INTERBODY FUSION (PLIF) 1 LEVEL N/A 02/14/2014   Procedure: FOR MAXIMUM ACCESS SURGERY POSTERIOR LUMBAR INTERBODY FUSION LUMBAR TWO-THREE;  Surgeon: Eustace Moore, MD;  Location: [facility];  Service: Neurosurgery;  Laterality: N/A;   NECK SURGERY     SHOULDER SURGERY     SPINE SURGERY     TONSILLECTOMY         Home Medications    Prior to Admission medications   Medication Sig Start Date End Date Taking? Authorizing Provider  albuterol (VENTOLIN HFA) 108 (90 Base) MCG/ACT inhaler Inhale 1-2 puffs into the lungs every 6 (six) hours as needed for wheezing or shortness of breath. 07/22/22  Yes Jaime Dome, Stasia Cavalier, FNP  azithromycin (ZITHROMAX) 250 MG tablet Take 1 tablet (250 mg total) by mouth daily. Take first 2 tablets together, then 1 every day until finished. 07/22/22  Yes Tison Leibold, Stasia Cavalier, FNP  benzonatate (TESSALON) 100 MG capsule Take 1 capsule (100 mg total) by mouth every 8 (eight) hours. 07/22/22  Yes Talbot Grumbling, FNP  acetaminophen (TYLENOL) 325 MG tablet Take 650 mg by mouth every 6 (six) hours as needed for mild pain, fever or headache.    [provider]  albuterol (VENTOLIN HFA) 108 (90 Base) MCG/ACT inhaler Inhale 1-2 puffs into the lungs every 6 (six) hours as needed for wheezing or shortness of breath. Patient not taking: Reported on 07/13/2022 02/24/21   [name], MD  ascorbic acid (VITAMIN C) 500 MG tablet Take 500 mg by mouth daily.    [provider]  atorvastatin (LIPITOR) 10 MG tablet TAKE 1 TABLET BY MOUTH EVERY DAY 03/15/22   [name], MD  Cholecalciferol (VITAMIN D-3) 25 MCG (1000 UT) CAPS Take by mouth daily.    [provider]  cyanocobalamin (VITAMIN B12) 1000 MCG tablet Take 1,000 mcg by mouth daily.    [provider]  DULoxetine (CYMBALTA) 60 MG capsule Take 60 mg by mouth at bedtime. 11/05/14   [provider]  folic acid (FOLVITE) Q000111Q MCG tablet Take 400 mcg by mouth daily.    [provider]  gabapentin (NEURONTIN) 300 MG capsule Take 1 capsule (300 mg total) by mouth 3 (three) times daily. 04/21/17   Rosemarie Ax, MD  meloxicam (MOBIC) 15 MG tablet Take 1 tablet (15 mg total) by mouth daily. 09/27/18   [name], MD  Multiple Vitamins-Minerals (CENTRUM SILVER 50+MEN) TABS Take by mouth.    [provider]  Oxycodone HCl 10 MG TABS Take 1-2 tablets (10-20 mg total) by mouth every 4 (four) hours as needed (pain). 06/10/20   Eustace Moore, MD  tiZANidine (ZANAFLEX) 4 MG tablet Take 4 mg by mouth 3 (three) times daily.  11/05/14   [provider]  triamcinolone (NASACORT) 55 MCG/ACT AERO nasal inhaler Place 2 sprays into the nose daily. Patient not taking: Reported on 07/13/2022 02/24/21   [name], MD  vitamin E 180 MG (400 UNITS) capsule Take 400 Units by mouth daily.    [provider]    Family History Family History  Problem Relation Age of Onset   Colon cancer Father 48       pt thinks father had colon cancer   Stomach cancer Father    Hypertension Mother    Colon polyps Brother    Aneurysm Maternal Aunt 89       brain   Heart disease Maternal Aunt        d 22   Stomach cancer Paternal Uncle 47   Aneurysm Maternal Grandfather        stomach   Colon cancer Maternal Grandfather        thinks grandfather had colon cancer   Stomach  cancer Paternal Grandfather 63   Depression Brother 18   Suicidality Brother    Stomach cancer Paternal Uncle 32   Esophageal cancer Neg Hx    Rectal cancer Neg Hx    Diabetes Neg Hx     Social History Social History   Tobacco Use   Smoking status: Every Day    Packs/day: 0.50    Years: 44.00    Additional pack years: 0.00    Total pack years: 22.00    Types: Cigarettes   Smokeless tobacco: Former    Types: Snuff  Vaping Use   Vaping Use: Never used  Substance Use Topics   Alcohol use: Yes    Alcohol/week: 8.0 standard drinks of alcohol    Types: 8 Standard drinks or equivalent per week    Comment: social beer   Drug use: No     Allergies   Cefuroxime axetil   Review of Systems Review of Systems  Respiratory:  Positive for cough.   Per HPI   Physical Exam Triage Vital Signs ED Triage Vitals  Enc Vitals Group     BP 07/22/22 1240 (!) 173/98     Pulse Rate 07/22/22 1240 98     Resp 07/22/22 1240 17     Temp 07/22/22 1240 98.5 F (36.9 C)     Temp Source 07/22/22 1240 Oral     SpO2 07/22/22 1240 94 %     Weight --      Height --      Head Circumference --      Peak Flow --      Pain Score 07/22/22 1242 4     Pain Loc --      Pain Edu? --      Excl. in [place]? --    No data found.  Updated Vital Signs BP (!) 173/98 (BP Location: Left Arm)   Pulse 98   Temp 98.5 F (36.9 C) (Oral)   Resp 17   SpO2 94%   Visual Acuity Right Eye Distance:   Left Eye Distance:   Bilateral Distance:    Right Eye Near:   Left Eye Near:  Bilateral Near:     Physical Exam Vitals and nursing note reviewed.  Constitutional:      Appearance: He is not ill-appearing or toxic-appearing.  HENT:     Head: Normocephalic and atraumatic.     Right Ear: Hearing, tympanic membrane, ear canal and external ear normal.     Left Ear: Hearing, tympanic membrane, ear canal and external ear normal.     Nose: Nose normal.     Mouth/Throat:     Lips: Pink.     Mouth: Mucous  membranes are moist. No injury.     Tongue: No lesions. Tongue does not deviate from midline.     Palate: No mass and lesions.     Pharynx: Oropharynx is clear. Uvula midline. No pharyngeal swelling, oropharyngeal exudate, posterior oropharyngeal erythema or uvula swelling.     Tonsils: No tonsillar exudate or tonsillar abscesses.  Eyes:     General: Lids are normal. Vision grossly intact. Gaze aligned appropriately.     Extraocular Movements: Extraocular movements intact.     Conjunctiva/sclera: Conjunctivae normal.  Cardiovascular:     Rate and Rhythm: Normal rate and regular rhythm.     Heart sounds: Normal heart sounds, S1 normal and S2 normal.  Pulmonary:     Effort: Pulmonary effort is normal. No respiratory distress.     Breath sounds: Normal air entry. Wheezing present.     Comments: Low pitched wheezing heard to the lower lung fields. No respiratory distress or increased respiratory effort. Speaking in full sentences without difficulty.  Musculoskeletal:     Cervical back: Neck supple.  Lymphadenopathy:     Cervical: No cervical adenopathy.  Skin:    General: Skin is warm and dry.     Capillary Refill: Capillary refill takes less than 2 seconds.     Findings: No rash.  Neurological:     General: No focal deficit present.     Mental Status: He is alert and oriented to person, place, and time. Mental status is at baseline.     Cranial Nerves: No dysarthria or facial asymmetry.  Psychiatric:        Mood and Affect: Mood normal.        Speech: Speech normal.        Behavior: Behavior normal.        Thought Content: Thought content normal.        Judgment: Judgment normal.      UC Treatments / Results  Labs (all labs ordered are listed, but only abnormal results are displayed) Labs Reviewed - No data to display  EKG   Radiology No results found.  Procedures Procedures (including critical care time)  Medications Ordered in UC Medications  albuterol (VENTOLIN  HFA) 108 (90 Base) MCG/ACT inhaler 2 puff (2 puffs Inhalation Provided for home use 07/22/22 1341)  dexamethasone (DECADRON) injection 10 mg (10 mg Intramuscular Given 07/22/22 1342)    Initial Impression / Assessment and Plan / UC Course  I have reviewed the triage vital signs and the nursing notes.  Pertinent labs & imaging results that were available during my care of the patient were reviewed by me and considered in my medical decision making (see chart for details).   1. COPD exacerbation Presentation is consistent with COPD exacerbation. Low suspicion for COVID-19/influenza etiology, therefore deferred testing with patient's agreeance. Deferred imaging based on stable cardiopulmonary exam and hemodynamically stable vital signs. Will manage this with dexamethasone 10mg  IM, azithromycin, albuterol inhaler as needed, and tessalon perles as  needed for symptomatic relief to be taken as directed. Advised to follow-up with PCP/pulmonologist soon for reevaluation. Strict ER and urgent care return precautions discussed.   Discussed physical exam and available lab work findings in clinic with patient.  Counseled patient regarding appropriate use of medications and potential side effects for all medications recommended or prescribed today. Discussed red flag signs and symptoms of worsening condition,when to call the PCP office, return to urgent care, and when to seek higher level of care in the emergency department. Patient verbalizes understanding and agreement with plan. All questions answered. Patient discharged in stable condition.    Final Clinical Impressions(s) / UC Diagnoses   Final diagnoses:  COPD exacerbation [facility])     Discharge Instructions      I suspect that your symptoms are due to a COPD exacerbation.  Take azithromycin as prescribed (2 pills today, then 1 pill until finished).  You may take Tessalon Perles every 8 hours as needed for cough.  I gave you a shot of steroid in  the clinic to help with your breathing and inflammation.  Please use albuterol every 4-6 hours as needed for cough, shortness of breath, and wheeze.  I heard some wheezing to your chest in the clinic and gave you some albuterol here.   If you develop any new or worsening symptoms or do not improve in the next 2 to 3 days, please return.  If your symptoms are severe, please go to the emergency room.  Follow-up with your primary care provider for further evaluation and management of your symptoms as well as ongoing wellness visits.  I hope you feel better!   ED Prescriptions     Medication Sig Dispense Auth. Provider   azithromycin (ZITHROMAX) 250 MG tablet Take 1 tablet (250 mg total) by mouth daily. Take first 2 tablets together, then 1 every day until finished. 6 tablet Talbot Grumbling, FNP   albuterol (VENTOLIN HFA) 108 (90 Base) MCG/ACT inhaler Inhale 1-2 puffs into the lungs every 6 (six) hours as needed for wheezing or shortness of breath. 8 g Joella Prince M, FNP   benzonatate (TESSALON) 100 MG capsule Take 1 capsule (100 mg total) by mouth every 8 (eight) hours. 21 capsule Talbot Grumbling, FNP      PDMP not reviewed this encounter.   Talbot Grumbling, [place] 07/22/22 1542

## 2022-07-26 ENCOUNTER — Emergency Department (HOSPITAL_COMMUNITY): Payer: Medicare Other

## 2022-07-26 ENCOUNTER — Other Ambulatory Visit: Payer: Self-pay

## 2022-07-26 ENCOUNTER — Encounter (HOSPITAL_COMMUNITY): Payer: Self-pay

## 2022-07-26 ENCOUNTER — Emergency Department (HOSPITAL_COMMUNITY)
Admission: EM | Admit: 2022-07-26 | Discharge: 2022-07-26 | Disposition: A | Discharge status: Home / Self Care | Payer: Medicare Other | Attending: Emergency Medicine | Admitting: Emergency Medicine

## 2022-07-26 DIAGNOSIS — S51812A Laceration without foreign body of left forearm, initial encounter: Secondary | ICD-10-CM | POA: Diagnosis not present

## 2022-07-26 DIAGNOSIS — W133XXA Fall through floor, initial encounter: Secondary | ICD-10-CM | POA: Diagnosis not present

## 2022-07-26 DIAGNOSIS — S59912A Unspecified injury of left forearm, initial encounter: Secondary | ICD-10-CM | POA: Diagnosis present

## 2022-07-26 DIAGNOSIS — S61412A Laceration without foreign body of left hand, initial encounter: Secondary | ICD-10-CM | POA: Diagnosis not present

## 2022-07-26 DIAGNOSIS — W19XXXA Unspecified fall, initial encounter: Secondary | ICD-10-CM

## 2022-07-26 DIAGNOSIS — M7989 Other specified soft tissue disorders: Secondary | ICD-10-CM | POA: Diagnosis not present

## 2022-07-26 DIAGNOSIS — S50812A Abrasion of left forearm, initial encounter: Secondary | ICD-10-CM | POA: Diagnosis not present

## 2022-07-26 DIAGNOSIS — T148XXA Other injury of unspecified body region, initial encounter: Secondary | ICD-10-CM

## 2022-07-26 MED ORDER — HYDROCODONE-ACETAMINOPHEN 5-325 MG PO TABS
1.0000 | ORAL_TABLET | Freq: Once | ORAL | Status: AC
  Administered 2022-07-26: 1 via ORAL
  Filled 2022-07-26: qty 1

## 2022-07-26 MED ORDER — DOXYCYCLINE HYCLATE 100 MG PO TABS
100.0000 mg | ORAL_TABLET | Freq: Once | ORAL | Status: AC
  Administered 2022-07-26: 100 mg via ORAL
  Filled 2022-07-26: qty 1

## 2022-07-26 MED ORDER — BACITRACIN ZINC 500 UNIT/GM EX OINT
TOPICAL_OINTMENT | Freq: Once | CUTANEOUS | Status: AC
  Administered 2022-07-26: 1 via TOPICAL
  Filled 2022-07-26: qty 0.9

## 2022-07-26 MED ORDER — DOXYCYCLINE HYCLATE 100 MG PO CAPS: 100.0000 mg | ORAL_CAPSULE | Freq: Two times a day (BID) | ORAL | 0 refills | Status: AC

## 2022-07-26 MED ORDER — LIDOCAINE-EPINEPHRINE (PF) 2 %-1:200000 IJ SOLN
10.0000 mL | Freq: Once | INTRAMUSCULAR | Status: AC
  Administered 2022-07-26: 10 mL
  Filled 2022-07-26: qty 20

## 2022-07-26 NOTE — ED Notes (Signed)
Wounds dressed with bacitracin, xeroform, nonadhesive, and kerlix. Patient tolerated well. Patient given discharge instructions and follow up care. Patient verbalized understanding. Patient ambulatory out of ED with spouse.

## 2022-07-26 NOTE — ED Notes (Signed)
Patient unsure if he has had a tetanus shot within the last 5 years.

## 2022-07-26 NOTE — ED Triage Notes (Signed)
C/o mechanical fall onto wood floor with multiple abrasions and pain to left arm.  Bleeding controlled  Denies blood thinner usage

## 2022-07-26 NOTE — ED Provider Notes (Signed)
[place] [facility] Provider Note   CSN: [id number] Arrival date & time: 07/26/22  1743     History  Chief Complaint  Patient presents with   [name] is a 62 y.o. male.   Fall     62 year old man now with medical history significant for arthritis, GERD, HLD, anxiety, depression, chronic back pain who presents to the emergency department after a mechanical ground-level fall.  The patient states that he fell after riding on his mower while bring it back into the garage.  He states that he had had at least 4-5 beers and was intoxicated in addition to consuming his daily oxycodone.  He states that he is prone to skin tears.  He thinks that his tetanus is up-to-date and in the medical record it is due in 2030.  He presents with multiple superficial abrasions and a laceration to his left forearm.  He states that he is right-handed.  He denies any other injuries or complaints, denies any head trauma or loss of consciousness. Not on AC. Arrives GCS 15, ABC intact.  Home Medications Prior to Admission medications   Medication Sig Start Date End Date Taking? Authorizing Provider  doxycycline (VIBRAMYCIN) 100 MG capsule Take 1 capsule (100 mg total) by mouth 2 (two) times daily for 7 days. 07/26/22 08/02/22 Yes Regan Lemming, MD  acetaminophen (TYLENOL) 325 MG tablet Take 650 mg by mouth every 6 (six) hours as needed for mild pain, fever or headache.    [provider]  albuterol (VENTOLIN HFA) 108 (90 Base) MCG/ACT inhaler Inhale 1-2 puffs into the lungs every 6 (six) hours as needed for wheezing or shortness of breath. Patient not taking: Reported on 07/13/2022 02/24/21   [name], MD  albuterol (VENTOLIN HFA) [phone] Base) MCG/ACT inhaler Inhale 1-2 puffs into the lungs every 6 (six) hours as needed for wheezing or shortness of breath. 07/22/22   Talbot Grumbling, FNP  ascorbic acid (VITAMIN C) 500 MG tablet Take 500 mg  by mouth daily.    [provider]  atorvastatin (LIPITOR) 10 MG tablet TAKE 1 TABLET BY MOUTH EVERY DAY 03/15/22   [name], MD  azithromycin (ZITHROMAX) 250 MG tablet Take 1 tablet (250 mg total) by mouth daily. Take first 2 tablets together, then 1 every day until finished. 07/22/22   Talbot Grumbling, FNP  benzonatate (TESSALON) 100 MG capsule Take 1 capsule (100 mg total) by mouth every 8 (eight) hours. 07/22/22   Talbot Grumbling, FNP  Cholecalciferol (VITAMIN D-3) 25 MCG (1000 UT) CAPS Take by mouth daily.    [provider]  cyanocobalamin (VITAMIN B12) 1000 MCG tablet Take 1,000 mcg by mouth daily.    [provider]  DULoxetine (CYMBALTA) 60 MG capsule Take 60 mg by mouth at bedtime. 11/05/14   [provider]  folic acid (FOLVITE) [id number] MCG tablet Take 400 mcg by mouth daily.    [provider]  gabapentin (NEURONTIN) 300 MG capsule Take 1 capsule (300 mg total) by mouth 3 (three) times daily. 04/21/17   Rosemarie Ax, MD  meloxicam (MOBIC) 15 MG tablet Take 1 tablet (15 mg total) by mouth daily. 09/27/18   [name], MD  Multiple Vitamins-Minerals (CENTRUM SILVER 50+MEN) TABS Take by mouth.    [provider]  Oxycodone HCl 10 MG TABS Take 1-2 tablets (10-20 mg total) by mouth every 4 (four) hours as needed (  pain). 06/10/20   Eustace Moore, MD  tiZANidine (ZANAFLEX) 4 MG tablet Take 4 mg by mouth 3 (three) times daily.  11/05/14   [provider]  triamcinolone (NASACORT) 55 MCG/ACT AERO nasal inhaler Place 2 sprays into the nose daily. Patient not taking: Reported on 07/13/2022 02/24/21   [name], MD  vitamin E 180 MG (400 UNITS) capsule Take 400 Units by mouth daily.    [provider]      Allergies    Cefuroxime axetil    Review of Systems   Review of Systems  All other systems reviewed and are negative.   Physical Exam Updated Vital Signs BP 135/87 (BP Location: Right Arm)   Pulse  94   Temp 98.2 F (36.8 C) (Oral)   Resp 18   Ht 5\' 11"  (1.803 m)   Wt 117.9 kg   SpO2 98%   BMI 36.26 kg/m  Physical Exam Vitals and nursing note reviewed.  Constitutional:      General: He is not in acute distress. HENT:     Head: Normocephalic and atraumatic.  Eyes:     Conjunctiva/sclera: Conjunctivae normal.     Pupils: Pupils are equal, round, and reactive to light.  Cardiovascular:     Rate and Rhythm: Normal rate and regular rhythm.  Pulmonary:     Effort: Pulmonary effort is normal. No respiratory distress.  Abdominal:     General: There is no distension.     Tenderness: There is no guarding.  Musculoskeletal:        General: Signs of injury present. No deformity.     Cervical back: Neck supple.     Comments: Multiple superficial abrasions to the left forearm and dorsum of the left hand to the left 2nd digit. Laceration to the left forearm, 4 to 5 cm in length and deep, LUE NVI. No evidence of tendon injury.  Skin:    Findings: No lesion or rash.  Neurological:     General: No focal deficit present.     Mental Status: He is alert. Mental status is at baseline.     ED Results / Procedures / Treatments   Labs (all labs ordered are listed, but only abnormal results are displayed) Labs Reviewed - No data to display  EKG None  Radiology DG Hand Complete Left  Result Date: 07/26/2022 CLINICAL DATA:  Fall injury EXAM: LEFT HAND - COMPLETE 3+ VIEW COMPARISON:  None Available. FINDINGS: No fracture or malalignment. No radiopaque foreign body in the soft tissues. Moderate arthritis at the first MCP joint and mild arthritis at the first [facility] joint IMPRESSION: No acute osseous abnormality Electronically Signed   By: Donavan Foil M.D.   On: 07/26/2022 18:53   DG Forearm Left  Result Date: 07/26/2022 CLINICAL DATA:  Fall EXAM: LEFT FOREARM - 2 VIEW COMPARISON:  None Available. FINDINGS: There is no evidence of fracture or other focal bone lesions. Soft tissue swelling  of the distal forearm. IMPRESSION: No evidence of fracture or dislocation. Electronically Signed   By: Yetta Glassman M.D.   On: 07/26/2022 18:28    Procedures .Marland KitchenLaceration Repair  Date/Time: 07/26/2022 10:56 PM  Performed by: Regan Lemming, MD Authorized by: Regan Lemming, MD   Consent:    Consent obtained:  Verbal   Consent given by:  Patient   Risks discussed:  Infection, pain, poor cosmetic result and need for additional repair Laceration details:    Location:  Shoulder/arm   Shoulder/arm location:  L lower arm   Length (cm):  4   Depth (mm):  3 Pre-procedure details:    Preparation:  Imaging obtained to evaluate for foreign bodies Exploration:    Imaging obtained: x-ray     Imaging outcome: foreign body not noted     Wound exploration: wound explored through full range of motion and entire depth of wound visualized   Treatment:    Area cleansed with:  Soap and water   Amount of cleaning:  Standard Skin repair:    Repair method:  Sutures   Suture size:  3-0   Suture material:  Prolene   Suture technique:  Simple interrupted   Number of sutures:  8 Approximation:    Approximation:  Close Repair type:    Repair type:  Simple Post-procedure details:    Dressing:  Antibiotic ointment, non-adherent dressing and adhesive bandage   Procedure completion:  Tolerated     Medications Ordered in ED Medications  bacitracin ointment (has no administration in time range)  doxycycline (VIBRA-TABS) tablet 100 mg (has no administration in time range)  lidocaine-EPINEPHrine (XYLOCAINE W/EPI) 2 %-1:200000 (PF) injection 10 mL (10 mLs Infiltration Given 07/26/22 2204)  HYDROcodone-acetaminophen (NORCO/VICODIN) 5-325 MG per tablet 1 tablet (1 tablet Oral Given 07/26/22 2206)    ED Course/ Medical Decision Making/ A&P                             Medical Decision Making Amount and/or Complexity of Data Reviewed Radiology: ordered.  Risk OTC drugs. Prescription drug  management.     62 year old man now with medical history significant for arthritis, GERD, HLD, anxiety, depression, chronic back pain who presents to the emergency department after a mechanical ground-level fall.  The patient states that he fell after riding on his mower while bring it back into the garage.  He states that he had had at least 4-5 beers and was intoxicated in addition to consuming his daily oxycodone.  He states that he is prone to skin tears.  He thinks that his tetanus is up-to-date and in the medical record it is due in 2030.  He presents with multiple superficial abrasions and a laceration to his left forearm.  He states that he is right-handed.  He denies any other injuries or complaints, denies any head trauma or loss of consciousness. Not on AC. Arrives GCS 15, ABC intact.  On arrival, the patient was vitally stable, afebrile, hemodynamically stable.  Physical exam concerning for left forearm laceration with multiple abrasions along the left forearm and along the dorsum of the left second digit.  His tetanus is up-to-date upon review of the EMR.  He has no other signs of traumatic injury.  Imaging was performed of the left forearm and left hand which was negative for acute traumatic injury, no evidence of foreign body.  His wounds were cleaned by nursing staff bedside with saline and his 4 cm laceration was closed with eight 3-0 Prolene sutures as per the procedure note above.  The patient was provided with wound care instructions.  His wounds were extensively wrapped after bacitracin ointment was applied with Xeroform and subsequent gauze dressing.  Wound care instructions were provided.  Advised return precautions for infectious considerations, follow-up in 7 to 10 days for suture removal and wound reassessment.  Patient has allergy listed to cephalosporins, will discharge on a course of doxycycline.   Final Clinical Impression(s) / ED Diagnoses Final diagnoses:  Fall,  initial  encounter  Skin abrasion  Laceration of left forearm, initial encounter    Rx / DC Orders ED Discharge Orders          Ordered    doxycycline (VIBRAMYCIN) 100 MG capsule  2 times daily        07/26/22 2259              Regan Lemming, MD 07/26/22 2304

## 2022-07-26 NOTE — Discharge Instructions (Addendum)
Watch for signs of infection with fever, chills, purulent discharge, redness, worsening pain.

## 2022-07-26 NOTE — ED Notes (Signed)
Patient's wounds cleaned and irrigated at this time. Patient tolerated okay. Suture cart/tray at bedside.

## 2022-07-26 NOTE — ED Provider Triage Note (Signed)
Emergency Medicine Provider Triage Evaluation Note  [name] , a 62 y.o. male  was evaluated in triage.  Pt complains of fall, left arm injuries, patient was on his riding mower, and fell bringing it back into the garage.  Patient reports he was intoxicated, had at least 4-5 beers, as well as his normal daily oxycodone.  Patient prone to skin tears, skin injuries, there try to figure out why this is, he reports no history of liver failure, but is being worked up by hematology.  Review of Systems  Positive: Skin tears, fall, left arm injury Negative: Head injury  Physical Exam  BP 104/74   Pulse 100   Temp 98 F (36.7 C)   Resp 17   SpO2 93%  Gen:   Awake, no distress   Resp:  Normal effort  MSK:   Moves extremities without difficulty  Other:  Many excoriations throughout, at least one deeper laceration on forearm that will likely require sutures  Medical Decision Making  Medically screening exam initiated at 6:24 PM.  Appropriate orders placed.  Ac Glade Leeper was informed that the remainder of the evaluation will be completed by another provider, this initial triage assessment does not replace that evaluation, and the importance of remaining in the ED until their evaluation is complete.  Workup initiated in triage    Anselmo Pickler, [place] 07/26/22 1826

## 2022-07-28 ENCOUNTER — Telehealth: Payer: Self-pay

## 2022-07-28 NOTE — Telephone Encounter (Signed)
     Patient  visit on 07/26/2022  at [facility] was for fall.  Have you been able to follow up with your primary care physician? Yes  The patient was or was not able to obtain any needed medicine or equipment. Patient was able to obtain medication.  Are there diet recommendations that you are having difficulty following? No  Patient expresses understanding of discharge instructions and education provided has no other needs at this time. Yes   [place] Resource Care Guide   [email or web address]  ?? [phone]   Website: [email or web address]  [email or web address]

## 2022-08-06 ENCOUNTER — Encounter: Payer: Self-pay | Admitting: Internal Medicine

## 2022-08-06 ENCOUNTER — Ambulatory Visit (INDEPENDENT_AMBULATORY_CARE_PROVIDER_SITE_OTHER): Payer: Medicare Other | Admitting: Internal Medicine

## 2022-08-06 VITALS — BP 132/78 | HR 83 | Temp 98.0°F | Ht 71.0 in | Wt 261.0 lb

## 2022-08-06 DIAGNOSIS — S41112D Laceration without foreign body of left upper arm, subsequent encounter: Secondary | ICD-10-CM | POA: Insufficient documentation

## 2022-08-06 DIAGNOSIS — J42 Unspecified chronic bronchitis: Secondary | ICD-10-CM | POA: Diagnosis not present

## 2022-08-06 DIAGNOSIS — F1010 Alcohol abuse, uncomplicated: Secondary | ICD-10-CM

## 2022-08-06 NOTE — Progress Notes (Signed)
Patient ID: [name], male   DOB: 1960/11/30, 62 y.o.   MRN: [id number]        Chief Complaint: follow up left arm laceration, copd, ETOH abuse       HPI:  [name] is a 62 y.o. male here with c/o fall on wooden surface with some ETOH related possibly seen in ED 3/25 with multple abrasions, bruising, small cuts to both arms left > right, but also rather large deeper laceration to the mid post arm, tx with stitching, and antibiotic prophylaxis.  Pt here for f/u, overall doing quite well with typical healing of the wound without worsening s/s cellulitis.  Pt denies chest pain, increased sob or doe, wheezing, orthopnea, PND, increased LE swelling, palpitations, dizziness or syncope.   Pt denies polydipsia, polyuria, or new focal neuro s/s.  Pt denies fever, wt loss, night sweats, loss of appetite, or other constitutional symptoms         Wt Readings from Last 3 Encounters:  08/06/22 261 lb (118.4 kg)  07/26/22 260 lb (117.9 kg)  07/13/22 259 lb 1.6 oz (117.5 kg)   BP Readings from Last 3 Encounters:  08/06/22 132/78  07/26/22 (!) 143/82  07/22/22 (!) 173/98         Past Medical History:  Diagnosis Date   Allergy    Anxiety    Arthritis    Back   Chronic back pain    Colon polyps    COPD (chronic obstructive pulmonary disease)    Depression    GERD (gastroesophageal reflux disease)    every now and then   Headache    Hyperlipidemia 09/26/2015   Lumbar surgical wound fluid collection    lumbar wound dehiscence   Pneumonia 2020   Past Surgical History:  Procedure Laterality Date   BACK SURGERY     COLONOSCOPY     COLONOSCOPY W/ BIOPSIES AND POLYPECTOMY     EYE SURGERY     IR US GUIDE BX ASP/DRAIN  11/18/2020   KNEE ARTHROSCOPY WITH MEDIAL MENISECTOMY Left 01/23/2016   Procedure: KNEE ARTHROSCOPY WITH MEDIAL MENISECTOMY;  Surgeon: Marcene CorningPeter Dalldorf, MD;  Location: [facility];  Service: Orthopedics;  Laterality: Left;   LUMBAR LAMINECTOMY/DECOMPRESSION  MICRODISCECTOMY N/A 06/09/2020   Procedure: Decompressive laminectomy Lumbarone -two;  Surgeon: Tia AlertJones, David S, MD;  [facility];  Service: Neurosurgery;  Laterality: N/A;   LUMBAR WOUND DEBRIDEMENT N/A 03/21/2014   Procedure: LUMBAR WOUND DEBRIDEMENT;  Surgeon: Tia Alertavid S Jones, MD;  Location: [facility];  Service: Neurosurgery;  Laterality: N/A;   MAXIMUM ACCESS (MAS)POSTERIOR LUMBAR INTERBODY FUSION (PLIF) 1 LEVEL N/A 02/14/2014   Procedure: FOR MAXIMUM ACCESS SURGERY POSTERIOR LUMBAR INTERBODY FUSION LUMBAR TWO-THREE;  Surgeon: Tia Alertavid S Jones, MD;  Location: [facility];  Service: Neurosurgery;  Laterality: N/A;   NECK SURGERY     SHOULDER SURGERY     SPINE SURGERY     TONSILLECTOMY      reports that he has been smoking cigarettes. He has a 22.00 pack-year smoking history. He has quit using smokeless tobacco.  His smokeless tobacco use included snuff. He reports current alcohol use of about 8.0 standard drinks of alcohol per week. He reports that he does not use drugs. family history includes Aneurysm in his maternal grandfather; Aneurysm (age of onset: 4958) in his maternal aunt; Colon cancer in his maternal grandfather; Colon cancer (age of onset: 2565) in his father; Colon polyps in his brother; Depression (age of onset: 6820)  in his brother; Heart disease in his maternal aunt; Hypertension in his mother; Stomach cancer in his father; Stomach cancer (age of onset: 27) in his paternal uncle; Stomach cancer (age of onset: 87) in his paternal uncle; Stomach cancer (age of onset: 71) in his paternal grandfather; Suicidality in his brother. Allergies  Allergen Reactions   Cefuroxime Axetil Hives   Current Outpatient Medications on File Prior to Visit  Medication Sig Dispense Refill   acetaminophen (TYLENOL) 325 MG tablet Take 650 mg by mouth every 6 (six) hours as needed for mild pain, fever or headache.     albuterol (VENTOLIN HFA) 108 (90 Base) MCG/ACT inhaler Inhale 1-2 puffs into the lungs every  6 (six) hours as needed for wheezing or shortness of breath. 8 g 0   ascorbic acid (VITAMIN C) 500 MG tablet Take 500 mg by mouth daily.     atorvastatin (LIPITOR) 10 MG tablet TAKE 1 TABLET BY MOUTH EVERY DAY 90 tablet 3   azithromycin (ZITHROMAX) 250 MG tablet Take 1 tablet (250 mg total) by mouth daily. Take first 2 tablets together, then 1 every day until finished. 6 tablet 0   benzonatate (TESSALON) 100 MG capsule Take 1 capsule (100 mg total) by mouth every 8 (eight) hours. 21 capsule 0   Cholecalciferol (VITAMIN D-3) 25 MCG (1000 UT) CAPS Take by mouth daily.     cyanocobalamin (VITAMIN B12) 1000 MCG tablet Take 1,000 mcg by mouth daily.     DULoxetine (CYMBALTA) 60 MG capsule Take 60 mg by mouth at bedtime.  2   folic acid (FOLVITE) 800 MCG tablet Take 400 mcg by mouth daily.     gabapentin (NEURONTIN) 300 MG capsule Take 1 capsule (300 mg total) by mouth 3 (three) times daily. 90 capsule 3   meloxicam (MOBIC) 15 MG tablet Take 1 tablet (15 mg total) by mouth daily. 90 tablet 3   Multiple Vitamins-Minerals (CENTRUM SILVER 50+MEN) TABS Take by mouth.     mupirocin ointment (BACTROBAN) 2 % Apply topically.     Oxycodone HCl 10 MG TABS Take 1-2 tablets (10-20 mg total) by mouth every 4 (four) hours as needed (pain). 40 tablet 0   tiZANidine (ZANAFLEX) 4 MG tablet Take 4 mg by mouth 3 (three) times daily.   2   vitamin E 180 MG (400 UNITS) capsule Take 400 Units by mouth daily.     albuterol (VENTOLIN HFA) 108 (90 Base) MCG/ACT inhaler Inhale 1-2 puffs into the lungs every 6 (six) hours as needed for wheezing or shortness of breath. (Patient not taking: Reported on 07/13/2022) 8.5 each 11   triamcinolone (NASACORT) 55 MCG/ACT AERO nasal inhaler Place 2 sprays into the nose daily. (Patient not taking: Reported on 07/13/2022) 1 each 12   No current facility-administered medications on file prior to visit.        ROS:  All others reviewed and negative.  Objective        PE:  BP 132/78    Pulse 83   Temp 98 F (36.7 C) (Oral)   Ht 5\' 11"  (1.803 m)   Wt 261 lb (118.4 kg)   SpO2 97%   BMI 36.40 kg/m                 Constitutional: Pt appears in NAD               HENT: Head: NCAT.                Right  Ear: External ear normal.                 Left Ear: External ear normal.                Eyes: . Pupils are equal, round, and reactive to light. Conjunctivae and EOM are normal               Nose: without d/c or deformity               Neck: Neck supple. Gross normal ROM               Cardiovascular: Normal rate and regular rhythm.                 Pulmonary/Chest: Effort normal and breath sounds without rales or wheezing.                Abd:  Soft, NT, ND, + BS, no organomegaly               Neurological: Pt is alert. At baseline orientation, motor grossly intact               Skin: Skin is warm. LE edema - none, left mid post arm with approx 4 cm area laceration with edges intact and minor swelling only, no redness or redstreaks, no abscess or drainage                Psychiatric: Pt behavior is normal without agitation   Micro: none  Cardiac tracings I have personally interpreted today:  none  Pertinent Radiological findings (summarize): none   Lab Results  Component Value Date   WBC 12.7 (H) 07/13/2022   HGB 15.9 07/13/2022   HCT 47.3 07/13/2022   PLT 302 07/13/2022   GLUCOSE 93 07/13/2022   CHOL 149 03/03/2022   TRIG 117.0 03/03/2022   HDL 56.70 03/03/2022   LDLDIRECT 150.1 03/07/2012   LDLCALC 69 03/03/2022   ALT 23 07/13/2022   AST 17 07/13/2022   NA 139 07/13/2022   K 4.1 07/13/2022   CL 103 07/13/2022   CREATININE 0.81 07/13/2022   BUN 13 07/13/2022   CO2 30 07/13/2022   TSH 2.37 03/03/2022   PSA 0.49 03/03/2022   INR 0.9 07/13/2022   HGBA1C 6.1 03/03/2022   Assessment/Plan:  [name] is a 62 y.o. White or Caucasian [1] male with  has a past medical history of Allergy, Anxiety, Arthritis, Chronic back pain, Colon polyps, COPD (chronic  obstructive pulmonary disease), Depression, GERD (gastroesophageal reflux disease), Headache, Hyperlipidemia (09/26/2015), Lumbar surgical wound fluid collection, and Pneumonia (2020).  Arm laceration, left, subsequent encounter Good initial healing, finished antibx tx, no further antibx needed, .stitches removed,  to f/u any worsening symptoms or concerns  Chronic obstructive pulmonary disease (HCC) Stable, continue albuterol inhlaer prn  Alcohol abuse Pt encouraged to abstain, consider [facility] management  Followup: Return in about 6 months (around 02/05/2023).  Oliver BarreJames Rani Idler, MD 08/06/2022 1:19 PM [facility]

## 2022-08-06 NOTE — Assessment & Plan Note (Signed)
Good initial healing, finished antibx tx, no further antibx needed, .stitches removed,  to f/u any worsening symptoms or concerns

## 2022-08-06 NOTE — Patient Instructions (Addendum)
Your stitches were removed today  Please continue all other medications as before, and refills have been done if requested.  Please have the pharmacy call with any other refills you may need.  Please continue your efforts at being more active, low cholesterol diet, and weight control.  You are otherwise up to date with prevention measures today.  Please keep your appointments with your specialists as you may have planned  Please make an Appointment to return in 6 months, or sooner if needed

## 2022-08-06 NOTE — Assessment & Plan Note (Signed)
Stable, continue albuterol inhlaer prn

## 2022-08-06 NOTE — Assessment & Plan Note (Signed)
Pt encouraged to abstain, consider [facility] for further management

## 2022-08-16 ENCOUNTER — Encounter: Payer: Self-pay | Admitting: *Deleted

## 2022-08-19 ENCOUNTER — Ambulatory Visit (INDEPENDENT_AMBULATORY_CARE_PROVIDER_SITE_OTHER): Payer: Medicare Other

## 2022-08-19 ENCOUNTER — Ambulatory Visit: Payer: Medicare Other | Admitting: Sports Medicine

## 2022-08-19 VITALS — HR 105 | Ht 71.0 in | Wt 259.0 lb

## 2022-08-19 DIAGNOSIS — S46811A Strain of other muscles, fascia and tendons at shoulder and upper arm level, right arm, initial encounter: Secondary | ICD-10-CM | POA: Diagnosis not present

## 2022-08-19 DIAGNOSIS — M542 Cervicalgia: Secondary | ICD-10-CM

## 2022-08-19 DIAGNOSIS — M7062 Trochanteric bursitis, left hip: Secondary | ICD-10-CM

## 2022-08-19 DIAGNOSIS — M25552 Pain in left hip: Secondary | ICD-10-CM

## 2022-08-19 DIAGNOSIS — M47812 Spondylosis without myelopathy or radiculopathy, cervical region: Secondary | ICD-10-CM | POA: Diagnosis not present

## 2022-08-19 DIAGNOSIS — M1612 Unilateral primary osteoarthritis, left hip: Secondary | ICD-10-CM | POA: Diagnosis not present

## 2022-08-19 NOTE — Patient Instructions (Addendum)
Good to see you  Hip and trap HEP  3 week follow up

## 2022-08-19 NOTE — Progress Notes (Signed)
[name] [address] [place] [address] Phone: [phone]   Assessment and Plan:     1. Neck pain 2. Strain of right trapezius muscle, initial encounter -Chronic with exacerbation, initial sports medicine visit - Flare of neck pain radiating through right trapezius, consistent with strain of right trapezius occurring from fall suffered 3 weeks ago - X-ray obtained in clinic.  My interpretation: No acute fracture or vertebral collapse.  Hardware in place from prior cervical fusion - Patient has already been taking meloxicam, tizanidine, gabapentin, oxycodone, and these medications have overall not provided relief or improvement. - Patient elected for trigger point injections.  Tolerated well per note below. - Start HEP for trapezius  Trigger Point Injection: After informed consent was obtained, skin cleaned with alcohol  prep.  A total of 3 trigger points identified along right trapezius.  Injections given over area of pain for total injection of 1 mL Kenalog 40 mg/ML and 1 ml lidocaine 1% w/o epi.  Patient had relief after the injection without side effects.  Pt given signs of infection to watch for.   3. Left hip pain 4. Greater trochanteric bursitis of left hip -Acute, uncomplicated, initial visit - Most consistent with greater trochanteric bursitis likely occurring due to gluteal strain and potentially direct trauma from fall, though patient cannot remember the specifics of what hit during this fall - X-ray obtained in clinic.  My interpretation: No acute fracture or dislocation.  Mild degenerative changes in left hip.  Intact hardware in lumbar spine from prior fusion - Patient has already been taking meloxicam, tizanidine, gabapentin, oxycodone, and these medications have overall not provided relief or improvement. -Patient elected for greater trochanteric CSI.  Tolerated well per note below - Start HEP for  hip  Procedure: Greater trochanteric bursal injection Side: Left  Risks explained and consent was given verbally. The site was cleaned with alcohol prep. A steroid injection was performed with patient in the lateral side-lying position at area of maximum tenderness over greater trochanter using 2mL of 1% lidocaine without epinephrine and 1mL of kenalog /ml. This was well tolerated and resulted in symptomatic relief.  Needle was removed, hemostasis achieved, and post injection instructions were explained.  Pt was advised to call or return to clinic if these symptoms worsen or fail to improve as anticipated.      Pertinent previous records reviewed include  Hand x-ray 07/26/2022, forearm x-ray 07/26/2022, internal medicine note 08/06/2022, ER note 07/26/2022   Follow Up: 3 weeks for reevaluation.  Could consider physical therapy if no improvement   Subjective:   I, [name], am serving as a Neurosurgeon for Doctor Richardean Sale  Chief Complaint: left hip pain   HPI:   08/19/22 Patient is a 62 year old male complaining of left hip pain. Patient states 3 weeks ago he fell while he was putting his mower up, left hip , upper trap and neck pain , no numbness and tingling, he states his thigh is always numb though , oxycodone for the pain and that doesn't touch the pain, decreased ROM in the neck and hip due to pain   Relevant Historical Information: COPD,  Additional pertinent review of systems negative.   Current Outpatient Medications:    acetaminophen (TYLENOL) 325 MG tablet, Take 650 mg by mouth every 6 (six) hours as needed for mild pain, fever or headache., Disp: , Rfl:    albuterol (VENTOLIN HFA) 108 (90 Base) MCG/ACT inhaler,  Inhale 1-2 puffs into the lungs every 6 (six) hours as needed for wheezing or shortness of breath., Disp: 8.5 each, Rfl: 11   albuterol (VENTOLIN HFA) 108 (90 Base) MCG/ACT inhaler, Inhale 1-2 puffs into the lungs every 6 (six) hours as needed for wheezing or  shortness of breath., Disp: 8 g, Rfl: 0   ascorbic acid (VITAMIN C) 500 MG tablet, Take 500 mg by mouth daily., Disp: , Rfl:    atorvastatin (LIPITOR) 10 MG tablet, TAKE 1 TABLET BY MOUTH EVERY DAY, Disp: 90 tablet, Rfl: 3   azithromycin (ZITHROMAX) 250 MG tablet, Take 1 tablet (250 mg total) by mouth daily. Take first 2 tablets together, then 1 every day until finished., Disp: 6 tablet, Rfl: 0   benzonatate (TESSALON) 100 MG capsule, Take 1 capsule (100 mg total) by mouth every 8 (eight) hours., Disp: 21 capsule, Rfl: 0   Cholecalciferol (VITAMIN D-3) 25 MCG (1000 UT) CAPS, Take by mouth daily., Disp: , Rfl:    cyanocobalamin (VITAMIN B12) 1000 MCG tablet, Take 1,000 mcg by mouth daily., Disp: , Rfl:    DULoxetine (CYMBALTA) 60 MG capsule, Take 60 mg by mouth at bedtime., Disp: , Rfl: 2   folic acid (FOLVITE) 800 MCG tablet, Take 400 mcg by mouth daily., Disp: , Rfl:    gabapentin (NEURONTIN) 300 MG capsule, Take 1 capsule (300 mg total) by mouth 3 (three) times daily., Disp: 90 capsule, Rfl: 3   meloxicam (MOBIC) 15 MG tablet, Take 1 tablet (15 mg total) by mouth daily., Disp: 90 tablet, Rfl: 3   Multiple Vitamins-Minerals (CENTRUM SILVER 50+MEN) TABS, Take by mouth., Disp: , Rfl:    mupirocin ointment (BACTROBAN) 2 %, Apply topically., Disp: , Rfl:    Oxycodone HCl 10 MG TABS, Take 1-2 tablets (10-20 mg total) by mouth every 4 (four) hours as needed (pain)., Disp: 40 tablet, Rfl: 0   tiZANidine (ZANAFLEX) 4 MG tablet, Take 4 mg by mouth 3 (three) times daily. , Disp: , Rfl: 2   triamcinolone (NASACORT) 55 MCG/ACT AERO nasal inhaler, Place 2 sprays into the nose daily., Disp: 1 each, Rfl: 12   vitamin E 180 MG (400 UNITS) capsule, Take 400 Units by mouth daily., Disp: , Rfl:    Objective:     Vitals:   08/19/22 1015  Pulse: (!) 105  SpO2: 98%  Weight: 259 lb (117.5 kg)  Height: 5\' 11"  (1.803 m)      Body mass index is 36.12 kg/m.    Physical Exam:    Neck Exam: Cervical Spine-  Posture normal Skin- normal, intact  Neuro:  Strength-  Right Left   Deltoid (C5) 5/5 5/5  Bicep/Brachioradialis (C5/6) 5/5  5/5  Wrist Extension (C6) 5/5 5/5  Tricep (C7) 5/5 5/5  Wrist Flexion (C7) 5/5 5/5  Grip (C8) 5/5 5/5  Finger Abduction (T1) 5/5 5/5   Sensation: intact to light touch in upper extremities bilaterally  Spurling's:  negative bilaterally Neck ROM: Baseline active ROM with reduced rotation, flexion, extension with prior cervical fusion TTP: Cervical spinous processes, right cervical paraspinal, right thoracic paraspinal, right trapezius NTTP:  left cervical paraspinal, thoracic paraspinal, trapezius  General: awake, alert, and oriented no acute distress, nontoxic Skin: no suspicious lesions or rashes Neuro:sensation intact distally with no deficits, normal muscle tone, no atrophy, strength 5/5 in all tested lower ext groups Psych: normal mood and affect, speech clear   Left hip: No deformity, swelling or wasting ROM Flexion 90, ext 30, IR 45, ER  45 TTP greater trochanter, gluteal musculature NTTP over the hip flexors,   si joint, lumbar spine Negative log roll with FROM Positive FABER for external hip pain Positive FADIR for internal hip/groin pain  Gait normal   Electronically signed by:  [name] 10:57 AM 08/19/22

## 2022-08-25 ENCOUNTER — Ambulatory Visit: Payer: Medicare Other | Admitting: Psychiatry

## 2022-09-01 ENCOUNTER — Other Ambulatory Visit: Payer: Self-pay

## 2022-09-01 ENCOUNTER — Emergency Department (HOSPITAL_BASED_OUTPATIENT_CLINIC_OR_DEPARTMENT_OTHER): Payer: Medicare Other | Admitting: Radiology

## 2022-09-01 ENCOUNTER — Emergency Department (HOSPITAL_BASED_OUTPATIENT_CLINIC_OR_DEPARTMENT_OTHER)
Admission: EM | Admit: 2022-09-01 | Discharge: 2022-09-01 | Disposition: A | Discharge status: Home / Self Care | Payer: Medicare Other | Attending: Emergency Medicine | Admitting: Emergency Medicine

## 2022-09-01 ENCOUNTER — Encounter (HOSPITAL_BASED_OUTPATIENT_CLINIC_OR_DEPARTMENT_OTHER): Payer: Self-pay

## 2022-09-01 DIAGNOSIS — S8992XA Unspecified injury of left lower leg, initial encounter: Secondary | ICD-10-CM | POA: Diagnosis present

## 2022-09-01 DIAGNOSIS — W268XXA Contact with other sharp object(s), not elsewhere classified, initial encounter: Secondary | ICD-10-CM | POA: Insufficient documentation

## 2022-09-01 DIAGNOSIS — S81812A Laceration without foreign body, left lower leg, initial encounter: Secondary | ICD-10-CM | POA: Diagnosis not present

## 2022-09-01 MED ORDER — AMOXICILLIN-POT CLAVULANATE 875-125 MG PO TABS: 1.0000 | ORAL_TABLET | Freq: Two times a day (BID) | ORAL | 0 refills | Status: DC

## 2022-09-01 MED ORDER — LIDOCAINE-EPINEPHRINE (PF) 2 %-1:200000 IJ SOLN
20.0000 mL | Freq: Once | INTRAMUSCULAR | Status: AC
  Administered 2022-09-01: 20 mL
  Filled 2022-09-01: qty 20

## 2022-09-01 NOTE — ED Notes (Signed)
ED Provider at bedside. 

## 2022-09-01 NOTE — ED Notes (Signed)
Suture cart, suture tray, stapler, and lidocaine setup at bedside.

## 2022-09-01 NOTE — Discharge Instructions (Signed)
leftWOUND CARE Please have your stitches/staples removed in 10 days or sooner if you have concerns. You may do this at any available urgent care or at your primary care doctor's office.  Keep area clean and dry for 24 hours. Do not remove bandage, if applied.  After 24 hours, remove bandage and wash wound gently with mild soap and warm water. Reapply a new bandage after cleaning wound, if directed.  Continue daily cleansing with soap and water until stitches/staples are removed.  Do not apply any ointments or creams to the wound while stitches/staples are in place, as this may cause delayed healing.  Seek medical careif you experience any of the following signs of infection: Swelling, redness, pus drainage, streaking, fever >101.0 F  Seek care if you experience excessive bleeding that does not stop after 15-20 minutes of constant, firm pressure.

## 2022-09-01 NOTE — ED Triage Notes (Signed)
Patient here POV from Home.  Endorses Left Leg Laceration against Vinyl Siding that occurred 2 Hours ago. 4-5 cm. Bleeding controlled. No Anticoagulants. Tetanus UTD.  NAD Noted during Triage. A&Ox4. GCS 15. BIB Wheelchair.

## 2022-09-01 NOTE — ED Provider Notes (Signed)
[place] EMERGENCY DEPARTMENT AT [facility] Provider Note   CSN: [id number] Arrival date & time: 09/01/22  1624     History  Chief Complaint  Patient presents with   Laceration    [name] is a 62 y.o. male who presents emergency department chief complaint of laceration.  This occurred approximately 3 hours ago.  Patient cut the side of his left lateral lower extremity on vinyl siding.  He is up-to-date on his tetanus vaccination.  He denies numbness tingling.  Bleeding is controlled.   Laceration      Home Medications Prior to Admission medications   Medication Sig Start Date End Date Taking? Authorizing Provider  acetaminophen (TYLENOL) 325 MG tablet Take 650 mg by mouth every 6 (six) hours as needed for mild pain, fever or headache.    [provider]  albuterol (VENTOLIN HFA) 108 (90 Base) MCG/ACT inhaler Inhale 1-2 puffs into the lungs every 6 (six) hours as needed for wheezing or shortness of breath. 02/24/21   [name], MD  albuterol (VENTOLIN HFA) 108 (90 Base) MCG/ACT inhaler Inhale 1-2 puffs into the lungs every 6 (six) hours as needed for wheezing or shortness of breath. 07/22/22   Carlisle Beers, FNP  ascorbic acid (VITAMIN C) 500 MG tablet Take 500 mg by mouth daily.    [provider]  atorvastatin (LIPITOR) 10 MG tablet TAKE 1 TABLET BY MOUTH EVERY DAY 03/15/22   [name], MD  azithromycin (ZITHROMAX) 250 MG tablet Take 1 tablet (250 mg total) by mouth daily. Take first 2 tablets together, then 1 every day until finished. 07/22/22   Carlisle Beers, FNP  benzonatate (TESSALON) 100 MG capsule Take 1 capsule (100 mg total) by mouth every 8 (eight) hours. 07/22/22   Carlisle Beers, FNP  Cholecalciferol (VITAMIN D-3) 25 MCG (1000 UT) CAPS Take by mouth daily.    [provider]  cyanocobalamin (VITAMIN B12) 1000 MCG tablet Take 1,000 mcg by mouth daily.    [provider]  DULoxetine  (CYMBALTA) 60 MG capsule Take 60 mg by mouth at bedtime. 11/05/14   [provider]  folic acid (FOLVITE) 800 MCG tablet Take 400 mcg by mouth daily.    [provider]  gabapentin (NEURONTIN) 300 MG capsule Take 1 capsule (300 mg total) by mouth 3 (three) times daily. 04/21/17   Myra Rude, MD  meloxicam (MOBIC) 15 MG tablet Take 1 tablet (15 mg total) by mouth daily. 09/27/18   [name], MD  Multiple Vitamins-Minerals (CENTRUM SILVER 50+MEN) TABS Take by mouth.    [provider]  mupirocin ointment (BACTROBAN) 2 % Apply topically. 05/17/22   [provider]  Oxycodone HCl 10 MG TABS Take 1-2 tablets (10-20 mg total) by mouth every 4 (four) hours as needed (pain). 06/10/20   Tia Alert, MD  tiZANidine (ZANAFLEX) 4 MG tablet Take 4 mg by mouth 3 (three) times daily.  11/05/14   [provider]  triamcinolone (NASACORT) 55 MCG/ACT AERO nasal inhaler Place 2 sprays into the nose daily. 02/24/21   [name], MD  vitamin E 180 MG (400 UNITS) capsule Take 400 Units by mouth daily.    [provider]      Allergies    Cefuroxime axetil    Review of Systems   Review of Systems  Physical Exam Updated Vital Signs BP 124/66   Pulse 92   Temp 98.1 F (36.7 C)  Resp 18   Ht 5\' 11"  (1.803 m)   Wt 117.5 kg   SpO2 100%   BMI 36.13 kg/m  Physical Exam Vitals and nursing note reviewed.  Constitutional:      General: He is not in acute distress.    Appearance: He is well-developed. He is not diaphoretic.  HENT:     Head: Normocephalic and atraumatic.  Eyes:     General: No scleral icterus.    Conjunctiva/sclera: Conjunctivae normal.  Cardiovascular:     Rate and Rhythm: Normal rate and regular rhythm.     Heart sounds: Normal heart sounds.  Pulmonary:     Effort: Pulmonary effort is normal. No respiratory distress.     Breath sounds: Normal breath sounds.  Abdominal:     Palpations: Abdomen is soft.     Tenderness:  There is no abdominal tenderness.  Musculoskeletal:        General: Normal range of motion.     Cervical back: Normal range of motion and neck supple.  Skin:    General: Skin is warm and dry.     Comments: Laceration 9 cm noted.  Description: ragged edges. Neurovascular and tendon/fascia structures are intact.   Neurological:     Mental Status: He is alert and oriented to person, place, and time.     Deep Tendon Reflexes: Reflexes are normal and symmetric.  Psychiatric:        Behavior: Behavior normal.     ED Results / Procedures / Treatments   Labs (all labs ordered are listed, but only abnormal results are displayed) Labs Reviewed - No data to display  EKG None  Radiology DG Tibia/Fibula Left  Result Date: 09/01/2022 CLINICAL DATA:  Vinyl siding laceration of the left leg EXAM: LEFT TIBIA AND FIBULA - 2 VIEW COMPARISON:  None Available. FINDINGS: Bandaging overlies a volar soft tissue irregularity along the mid calf particularly visible laterally. No unexpected foreign body or underlying bony abnormality. Aside from the laceration itself we do not demonstrate a substantial amount of gas tracking in the soft tissues. Plantar calcaneal spur.  Mild dorsal talonavicular spurring. IMPRESSION: 1. Soft tissue laceration along the mid calf without underlying bony abnormality or unexpected foreign body. 2. Plantar calcaneal spur. Electronically Signed   By: Gaylyn Rong M.D.   On: 09/01/2022 18:32    Procedures .Marland KitchenLaceration Repair  Date/Time: 09/01/2022 8:37 PM  Performed by: Arthor Captain, PA-C Authorized by: Arthor Captain, PA-C   Consent:    Consent obtained:  Verbal   Consent given by:  Patient   Risks discussed:  Infection, need for additional repair, pain and poor cosmetic result   Alternatives discussed:  No treatment Universal protocol:    Patient identity confirmed:  Arm band Anesthesia:    Anesthesia method:  Local infiltration   Local anesthetic:  Lidocaine 2%  WITH epi Laceration details:    Location:  Leg   Leg location:  L lower leg   Length (cm):  9   Depth (mm):  10 Pre-procedure details:    Preparation:  Patient was prepped and draped in usual sterile fashion Exploration:    Limited defect created (wound extended): yes     Wound exploration: wound explored through full range of motion and entire depth of wound visualized     Wound extent: fascia not violated and no signs of injury     Contaminated: no   Treatment:    Area cleansed with:  Chlorhexidine and Shur-Clens   Amount of  cleaning:  Standard   Irrigation solution:  Sterile saline   Irrigation method:  Pressure wash Skin repair:    Repair method:  Sutures   Suture size:  3-0   Suture material:  Prolene   Suture technique:  Running locked, simple interrupted and horizontal mattress   Number of sutures:  12 Approximation:    Approximation:  Close Repair type:    Repair type:  Intermediate Post-procedure details:    Dressing:  Adhesive bandage   Procedure completion:  Tolerated well, no immediate complications    12  HM, RI, SI    Medications Ordered in ED Medications  lidocaine-EPINEPHrine (XYLOCAINE W/EPI) 2 %-1:200000 (PF) injection 20 mL (20 mLs Infiltration Given by Other 09/01/22 1930)    ED Course/ Medical Decision Making/ A&P                             Medical Decision Making Amount and/or Complexity of Data Reviewed Radiology: ordered.  Risk Prescription drug management.           Final Clinical Impression(s) / ED Diagnoses Final diagnoses:  Laceration of left lower extremity, initial encounter    Rx / DC Orders ED Discharge Orders     None         Arthor Captain, PA-C 09/01/22 2039    Maia Plan, MD 09/02/22 1515

## 2022-09-08 NOTE — Progress Notes (Unsigned)
Aleen Sells [facility] [address] [place] [address] Phone: [phone]   Assessment and Plan:     There are no diagnoses linked to this encounter.  ***   Pertinent previous records reviewed include ***   Follow Up: ***     Subjective:   I, [name], am serving as a Neurosurgeon for Doctor Richardean Sale   Chief Complaint: left hip pain    HPI:    08/19/22 Patient is a 62 year old male complaining of left hip pain. Patient states 3 weeks ago he fell while he was putting his mower up, left hip , upper trap and neck pain , no numbness and tingling, he states his thigh is always numb though , oxycodone for the pain and that doesn't touch the pain, decreased ROM in the neck and hip due to pain   09/09/2022 Patient states   Relevant Historical Information: COPD,  Additional pertinent review of systems negative.   Current Outpatient Medications:    acetaminophen (TYLENOL) 325 MG tablet, Take 650 mg by mouth every 6 (six) hours as needed for mild pain, fever or headache., Disp: , Rfl:    albuterol (VENTOLIN HFA) 108 (90 Base) MCG/ACT inhaler, Inhale 1-2 puffs into the lungs every 6 (six) hours as needed for wheezing or shortness of breath., Disp: 8.5 each, Rfl: 11   albuterol (VENTOLIN HFA) 108 (90 Base) MCG/ACT inhaler, Inhale 1-2 puffs into the lungs every 6 (six) hours as needed for wheezing or shortness of breath., Disp: 8 g, Rfl: 0   amoxicillin-clavulanate (AUGMENTIN) 875-125 MG tablet, Take 1 tablet by mouth every 12 (twelve) hours., Disp: 14 tablet, Rfl: 0   ascorbic acid (VITAMIN C) 500 MG tablet, Take 500 mg by mouth daily., Disp: , Rfl:    atorvastatin (LIPITOR) 10 MG tablet, TAKE 1 TABLET BY MOUTH EVERY DAY, Disp: 90 tablet, Rfl: 3   azithromycin (ZITHROMAX) 250 MG tablet, Take 1 tablet (250 mg total) by mouth daily. Take first 2 tablets together, then 1 every day until finished., Disp: 6 tablet, Rfl: 0   benzonatate  (TESSALON) 100 MG capsule, Take 1 capsule (100 mg total) by mouth every 8 (eight) hours., Disp: 21 capsule, Rfl: 0   Cholecalciferol (VITAMIN D-3) 25 MCG (1000 UT) CAPS, Take by mouth daily., Disp: , Rfl:    cyanocobalamin (VITAMIN B12) 1000 MCG tablet, Take 1,000 mcg by mouth daily., Disp: , Rfl:    DULoxetine (CYMBALTA) 60 MG capsule, Take 60 mg by mouth at bedtime., Disp: , Rfl: 2   folic acid (FOLVITE) 800 MCG tablet, Take 400 mcg by mouth daily., Disp: , Rfl:    gabapentin (NEURONTIN) 300 MG capsule, Take 1 capsule (300 mg total) by mouth 3 (three) times daily., Disp: 90 capsule, Rfl: 3   meloxicam (MOBIC) 15 MG tablet, Take 1 tablet (15 mg total) by mouth daily., Disp: 90 tablet, Rfl: 3   Multiple Vitamins-Minerals (CENTRUM SILVER 50+MEN) TABS, Take by mouth., Disp: , Rfl:    mupirocin ointment (BACTROBAN) 2 %, Apply topically., Disp: , Rfl:    Oxycodone HCl 10 MG TABS, Take 1-2 tablets (10-20 mg total) by mouth every 4 (four) hours as needed (pain)., Disp: 40 tablet, Rfl: 0   tiZANidine (ZANAFLEX) 4 MG tablet, Take 4 mg by mouth 3 (three) times daily. , Disp: , Rfl: 2   triamcinolone (NASACORT) 55 MCG/ACT AERO nasal inhaler, Place 2 sprays into the nose daily., Disp: 1 each, Rfl: 12  vitamin E 180 MG (400 UNITS) capsule, Take 400 Units by mouth daily., Disp: , Rfl:    Objective:     There were no vitals filed for this visit.    There is no height or weight on file to calculate BMI.    Physical Exam:    ***   Electronically signed by:  Aleen Sells [facility] 7:32 AM 09/08/22

## 2022-09-09 ENCOUNTER — Ambulatory Visit: Payer: Medicare Other | Admitting: Sports Medicine

## 2022-09-09 VITALS — HR 83 | Ht 71.0 in | Wt 261.0 lb

## 2022-09-09 DIAGNOSIS — M25552 Pain in left hip: Secondary | ICD-10-CM | POA: Diagnosis not present

## 2022-09-09 DIAGNOSIS — S46811A Strain of other muscles, fascia and tendons at shoulder and upper arm level, right arm, initial encounter: Secondary | ICD-10-CM | POA: Diagnosis not present

## 2022-09-09 DIAGNOSIS — M542 Cervicalgia: Secondary | ICD-10-CM

## 2022-09-09 MED ORDER — METHYLPREDNISOLONE 4 MG PO TBPK: ORAL_TABLET | ORAL | 0 refills | Status: DC

## 2022-09-09 NOTE — Patient Instructions (Signed)
Prednisone dos pak  Continue HEP  4-5 week follow up

## 2022-09-10 ENCOUNTER — Ambulatory Visit: Payer: Medicare Other | Admitting: Psychiatry

## 2022-09-10 ENCOUNTER — Other Ambulatory Visit: Payer: Self-pay | Admitting: Internal Medicine

## 2022-09-10 DIAGNOSIS — Z9689 Presence of other specified functional implants: Secondary | ICD-10-CM | POA: Diagnosis not present

## 2022-09-10 DIAGNOSIS — H538 Other visual disturbances: Secondary | ICD-10-CM

## 2022-09-10 DIAGNOSIS — M961 Postlaminectomy syndrome, not elsewhere classified: Secondary | ICD-10-CM | POA: Diagnosis not present

## 2022-09-10 DIAGNOSIS — M7061 Trochanteric bursitis, right hip: Secondary | ICD-10-CM | POA: Diagnosis not present

## 2022-09-13 ENCOUNTER — Encounter: Payer: Self-pay | Admitting: Family Medicine

## 2022-09-13 ENCOUNTER — Ambulatory Visit: Payer: Medicare Other | Admitting: Internal Medicine

## 2022-09-13 ENCOUNTER — Ambulatory Visit (INDEPENDENT_AMBULATORY_CARE_PROVIDER_SITE_OTHER): Payer: Medicare Other | Admitting: Family Medicine

## 2022-09-13 VITALS — BP 142/86 | HR 80 | Temp 97.8°F | Resp 20 | Ht 71.0 in | Wt 262.0 lb

## 2022-09-13 DIAGNOSIS — Z4802 Encounter for removal of sutures: Secondary | ICD-10-CM | POA: Diagnosis not present

## 2022-09-13 DIAGNOSIS — R030 Elevated blood-pressure reading, without diagnosis of hypertension: Secondary | ICD-10-CM

## 2022-09-13 NOTE — Patient Instructions (Signed)
BP goal 140/90

## 2022-09-13 NOTE — Progress Notes (Signed)
Assessment & Plan:  1. Visit for suture removal 12 sutures removed without complication; patient tolerated well.  Area cleansed with normal saline; antibiotic ointment applied.  2. Elevated BP without diagnosis of hypertension Encouraged patient to monitor his blood pressure at home and keep a log.  Education provided on the DASH diet.  Discussed that his blood pressure goal is less than 140/90 and if he finds he is consistently running higher than this he needs to schedule an appointment to address with his PCP.  His   Follow up plan: Return if symptoms worsen or fail to improve.  Deliah Boston, MSN, APRN, FNP-C  Subjective:  HPI: [name] is a 62 y.o. male presenting on 09/13/2022 for Suture / Staple Removal (Cut left leg on vinyl siding on 5/1 - went [facility] for sutures )  Patient is here for suture removal from his left leg.  He had cut his leg on a piece of vinyl siding on 09/01/2022 at which time he was seen at [facility] where 12 sutures were placed.  He is up-to-date on his tetanus vaccine.  He has completed a course of Augmentin.  Denies erythema, swelling, fever, and drainage.   ROS: Negative unless specifically indicated above in HPI.   Relevant past medical history reviewed and updated as indicated.   Allergies and medications reviewed and updated.   Current Outpatient Medications:    acetaminophen (TYLENOL) 325 MG tablet, Take 650 mg by mouth every 6 (six) hours as needed for mild pain, fever or headache., Disp: , Rfl:    albuterol (VENTOLIN HFA) 108 (90 Base) MCG/ACT inhaler, Inhale 1-2 puffs into the lungs every 6 (six) hours as needed for wheezing or shortness of breath., Disp: 8.5 each, Rfl: 11   ascorbic acid (VITAMIN C) 500 MG tablet, Take 500 mg by mouth daily., Disp: , Rfl:    atorvastatin (LIPITOR) 10 MG tablet, TAKE 1 TABLET BY MOUTH EVERY DAY, Disp: 90 tablet, Rfl: 3   Cholecalciferol (VITAMIN D-3) 25 MCG (1000 UT) CAPS, Take by mouth  daily., Disp: , Rfl:    cyanocobalamin (VITAMIN B12) 1000 MCG tablet, Take 1,000 mcg by mouth daily., Disp: , Rfl:    DULoxetine (CYMBALTA) 60 MG capsule, Take 60 mg by mouth at bedtime., Disp: , Rfl: 2   folic acid (FOLVITE) 800 MCG tablet, Take 400 mcg by mouth daily., Disp: , Rfl:    gabapentin (NEURONTIN) 300 MG capsule, Take 1 capsule (300 mg total) by mouth 3 (three) times daily., Disp: 90 capsule, Rfl: 3   meloxicam (MOBIC) 15 MG tablet, Take 1 tablet (15 mg total) by mouth daily., Disp: 90 tablet, Rfl: 3   methylPREDNISolone (MEDROL DOSEPAK) 4 MG TBPK tablet, Take 6 tablets on day 1.  Take 5 tablets on day 2.  Take 4 tablets on day 3.  Take 3 tablets on day 4.  Take 2 tablets on day 5.  Take 1 tablet on day 6., Disp: 21 tablet, Rfl: 0   Multiple Vitamins-Minerals (CENTRUM SILVER 50+MEN) TABS, Take by mouth., Disp: , Rfl:    mupirocin ointment (BACTROBAN) 2 %, Apply topically., Disp: , Rfl:    Oxycodone HCl 10 MG TABS, Take 1-2 tablets (10-20 mg total) by mouth every 4 (four) hours as needed (pain)., Disp: 40 tablet, Rfl: 0   tiZANidine (ZANAFLEX) 4 MG tablet, Take 4 mg by mouth 3 (three) times daily. , Disp: , Rfl: 2   triamcinolone (NASACORT) 55 MCG/ACT AERO nasal inhaler, Place  2 sprays into the nose daily., Disp: 1 each, Rfl: 12   vitamin E 180 MG (400 UNITS) capsule, Take 400 Units by mouth daily., Disp: , Rfl:   Allergies  Allergen Reactions   Cefuroxime Axetil Hives    Objective:   BP (!) 142/86   Pulse 80   Temp 97.8 F (36.6 C)   Resp 20   Ht 5\' 11"  (1.803 m)   Wt 262 lb (118.8 kg)   BMI 36.54 kg/m    Physical Exam Vitals reviewed.  Constitutional:      General: He is not in acute distress.    Appearance: Normal appearance. He is not ill-appearing, toxic-appearing or diaphoretic.  HENT:     Head: Normocephalic and atraumatic.  Eyes:     General: No scleral icterus.       Right eye: No discharge.        Left eye: No discharge.     Conjunctiva/sclera:  Conjunctivae normal.  Cardiovascular:     Rate and Rhythm: Normal rate.  Pulmonary:     Effort: Pulmonary effort is normal. No respiratory distress.  Musculoskeletal:        General: Normal range of motion.     Cervical back: Normal range of motion.  Skin:    General: Skin is warm and dry.     Findings: Laceration (LLE well approximated with sutures without erythema, warmth, swelling, or drainage.) present.  Neurological:     Mental Status: He is alert and oriented to person, place, and time. Mental status is at baseline.  Psychiatric:        Mood and Affect: Mood normal.        Behavior: Behavior normal.        Thought Content: Thought content normal.        Judgment: Judgment normal.

## 2022-09-15 ENCOUNTER — Ambulatory Visit: Payer: Medicare Other | Admitting: Family Medicine

## 2022-09-20 ENCOUNTER — Ambulatory Visit: Payer: Medicare Other | Admitting: Internal Medicine

## 2022-09-28 DIAGNOSIS — Z9689 Presence of other specified functional implants: Secondary | ICD-10-CM | POA: Diagnosis not present

## 2022-09-29 NOTE — Progress Notes (Deleted)
[name] D.[facility] [address] [place] [address] Phone: [phone]   Assessment and Plan:     There are no diagnoses linked to this encounter.  ***   Pertinent previous records reviewed include ***   Follow Up: ***     Subjective:   I, [name], am serving as a Neurosurgeon for Doctor Richardean Sale   Chief Complaint: left hip pain    HPI:    08/19/22 Patient is a 62 year old male complaining of left hip pain. Patient states 3 weeks ago he fell while he was putting his mower up, left hip , upper trap and neck pain , no numbness and tingling, he states his thigh is always numb though , oxycodone for the pain and that doesn't touch the pain, decreased ROM in the neck and hip due to pain    09/09/2022 Patient states that he still has pain in his neck and hip , says he got stiches in his leg from a pool accident    10/07/2022 Patient states    Relevant Historical Information: COPD,    Additional pertinent review of systems negative.   Current Outpatient Medications:    acetaminophen (TYLENOL) 325 MG tablet, Take 650 mg by mouth every 6 (six) hours as needed for mild pain, fever or headache., Disp: , Rfl:    albuterol (VENTOLIN HFA) 108 (90 Base) MCG/ACT inhaler, Inhale 1-2 puffs into the lungs every 6 (six) hours as needed for wheezing or shortness of breath., Disp: 8.5 each, Rfl: 11   ascorbic acid (VITAMIN C) 500 MG tablet, Take 500 mg by mouth daily., Disp: , Rfl:    atorvastatin (LIPITOR) 10 MG tablet, TAKE 1 TABLET BY MOUTH EVERY DAY, Disp: 90 tablet, Rfl: 3   Cholecalciferol (VITAMIN D-3) 25 MCG (1000 UT) CAPS, Take by mouth daily., Disp: , Rfl:    cyanocobalamin (VITAMIN B12) 1000 MCG tablet, Take 1,000 mcg by mouth daily., Disp: , Rfl:    DULoxetine (CYMBALTA) 60 MG capsule, Take 60 mg by mouth at bedtime., Disp: , Rfl: 2   folic acid (FOLVITE) 800 MCG tablet, Take 400 mcg by mouth daily., Disp: , Rfl:     gabapentin (NEURONTIN) 300 MG capsule, Take 1 capsule (300 mg total) by mouth 3 (three) times daily., Disp: 90 capsule, Rfl: 3   meloxicam (MOBIC) 15 MG tablet, Take 1 tablet (15 mg total) by mouth daily., Disp: 90 tablet, Rfl: 3   methylPREDNISolone (MEDROL DOSEPAK) 4 MG TBPK tablet, Take 6 tablets on day 1.  Take 5 tablets on day 2.  Take 4 tablets on day 3.  Take 3 tablets on day 4.  Take 2 tablets on day 5.  Take 1 tablet on day 6., Disp: 21 tablet, Rfl: 0   Multiple Vitamins-Minerals (CENTRUM SILVER 50+MEN) TABS, Take by mouth., Disp: , Rfl:    mupirocin ointment (BACTROBAN) 2 %, Apply topically., Disp: , Rfl:    Oxycodone HCl 10 MG TABS, Take 1-2 tablets (10-20 mg total) by mouth every 4 (four) hours as needed (pain)., Disp: 40 tablet, Rfl: 0   tiZANidine (ZANAFLEX) 4 MG tablet, Take 4 mg by mouth 3 (three) times daily. , Disp: , Rfl: 2   triamcinolone (NASACORT) 55 MCG/ACT AERO nasal inhaler, Place 2 sprays into the nose daily., Disp: 1 each, Rfl: 12   vitamin E 180 MG (400 UNITS) capsule, Take 400 Units by mouth daily., Disp: , Rfl:    Objective:  There were no vitals filed for this visit.    There is no height or weight on file to calculate BMI.    Physical Exam:    ***   Electronically signed by:  [name] [facility] 8:21 AM 09/29/22

## 2022-10-07 ENCOUNTER — Ambulatory Visit: Payer: Medicare Other | Admitting: Sports Medicine

## 2022-11-11 ENCOUNTER — Encounter: Payer: Self-pay | Admitting: Emergency Medicine

## 2022-12-20 ENCOUNTER — Other Ambulatory Visit: Payer: Self-pay | Admitting: Internal Medicine

## 2022-12-30 DIAGNOSIS — Z9689 Presence of other specified functional implants: Secondary | ICD-10-CM | POA: Diagnosis not present

## 2022-12-30 DIAGNOSIS — M961 Postlaminectomy syndrome, not elsewhere classified: Secondary | ICD-10-CM | POA: Diagnosis not present

## 2022-12-30 DIAGNOSIS — M7061 Trochanteric bursitis, right hip: Secondary | ICD-10-CM | POA: Diagnosis not present

## 2022-12-30 DIAGNOSIS — M5416 Radiculopathy, lumbar region: Secondary | ICD-10-CM | POA: Diagnosis not present

## 2023-01-04 ENCOUNTER — Emergency Department (HOSPITAL_BASED_OUTPATIENT_CLINIC_OR_DEPARTMENT_OTHER)
Admission: EM | Admit: 2023-01-04 | Discharge: 2023-01-05 | Disposition: A | Discharge status: Home / Self Care | Payer: Medicare Other | Attending: Emergency Medicine | Admitting: Emergency Medicine

## 2023-01-04 ENCOUNTER — Other Ambulatory Visit: Payer: Self-pay

## 2023-01-04 DIAGNOSIS — S81812A Laceration without foreign body, left lower leg, initial encounter: Secondary | ICD-10-CM | POA: Diagnosis not present

## 2023-01-04 DIAGNOSIS — S8012XA Contusion of left lower leg, initial encounter: Secondary | ICD-10-CM

## 2023-01-04 DIAGNOSIS — S81811A Laceration without foreign body, right lower leg, initial encounter: Secondary | ICD-10-CM | POA: Diagnosis not present

## 2023-01-04 DIAGNOSIS — S81819A Laceration without foreign body, unspecified lower leg, initial encounter: Secondary | ICD-10-CM

## 2023-01-04 DIAGNOSIS — W28XXXA Contact with powered lawn mower, initial encounter: Secondary | ICD-10-CM | POA: Insufficient documentation

## 2023-01-04 MED ORDER — BACITRACIN ZINC 500 UNIT/GM EX OINT: 1.0000 | TOPICAL_OINTMENT | Freq: Two times a day (BID) | CUTANEOUS | 0 refills | Status: DC

## 2023-01-04 MED ORDER — OXYCODONE-ACETAMINOPHEN 5-325 MG PO TABS
1.0000 | ORAL_TABLET | Freq: Once | ORAL | Status: AC
  Administered 2023-01-04: 1 via ORAL
  Filled 2023-01-04: qty 1

## 2023-01-04 MED ORDER — BACITRACIN ZINC 500 UNIT/GM EX OINT
TOPICAL_OINTMENT | Freq: Once | CUTANEOUS | Status: AC
  Administered 2023-01-04: 31.5 via TOPICAL
  Filled 2023-01-04: qty 28.35

## 2023-01-04 MED ORDER — LIDOCAINE-EPINEPHRINE-TETRACAINE (LET) TOPICAL GEL
3.0000 mL | Freq: Once | TOPICAL | Status: AC
  Administered 2023-01-04: 3 mL via TOPICAL
  Filled 2023-01-04: qty 3

## 2023-01-04 NOTE — ED Notes (Signed)
Areas cleansed with NS and wound cleanser. Bacitracin applied and xeroform gauze, covered with 4x4s and kerlic and secured with medipore tape.  Pt tolerated well

## 2023-01-04 NOTE — ED Provider Notes (Signed)
[place] EMERGENCY DEPARTMENT AT [facility] Provider Note   CSN: [id number] Arrival date & time: 01/04/23  1846     History  Chief Complaint  Patient presents with   Leg Injury    [name] is a 62 y.o. male.  Patient is a 62 year old male no significant past medical history presenting to the emergency department with skin tears of his bilateral legs.  He states that he was mowing the lawn today and that his lawnmower got stuck in a ditch.  He states that when he tried to pull it out he fell to the ground and scraped up his bilateral shins.  He states that he has had significant pain and bleeding.  He states that he was able to walk on his legs.  He denies hitting his head or losing consciousness.  He denies any numbness or weakness.  He denies any blood thinner use.  He states that his tetanus is up-to-date.  The history is provided by the patient and the spouse.       Home Medications Prior to Admission medications   Medication Sig Start Date End Date Taking? Authorizing Provider  bacitracin ointment Apply 1 Application topically 2 (two) times daily. 01/04/23  Yes Elayne Snare K, DO  acetaminophen (TYLENOL) 325 MG tablet Take 650 mg by mouth every 6 (six) hours as needed for mild pain, fever or headache.    [provider]  albuterol (VENTOLIN HFA) 108 (90 Base) MCG/ACT inhaler Inhale 1-2 puffs into the lungs every 6 (six) hours as needed for wheezing or shortness of breath. 02/24/21   [name], MD  ascorbic acid (VITAMIN C) 500 MG tablet Take 500 mg by mouth daily.    [provider]  atorvastatin (LIPITOR) 10 MG tablet TAKE 1 TABLET BY MOUTH EVERY DAY 03/15/22   [name], MD  Cholecalciferol (VITAMIN D-3) 25 MCG (1000 UT) CAPS Take by mouth daily.    [provider]  cyanocobalamin (VITAMIN B12) 1000 MCG tablet Take 1,000 mcg by mouth daily.    [provider]  DULoxetine (CYMBALTA) 60 MG capsule Take 60 mg by  mouth at bedtime. 11/05/14   [provider]  folic acid (FOLVITE) 800 MCG tablet Take 400 mcg by mouth daily.    [provider]  gabapentin (NEURONTIN) 300 MG capsule Take 1 capsule (300 mg total) by mouth 3 (three) times daily. 04/21/17   Myra Rude, MD  meloxicam (MOBIC) 15 MG tablet Take 1 tablet (15 mg total) by mouth daily. 09/27/18   [name], MD  methylPREDNISolone (MEDROL DOSEPAK) 4 MG TBPK tablet Take 6 tablets on day 1.  Take 5 tablets on day 2.  Take 4 tablets on day 3.  Take 3 tablets on day 4.  Take 2 tablets on day 5.  Take 1 tablet on day 6. 09/09/22   Richardean Sale, DO  Multiple Vitamins-Minerals (CENTRUM SILVER 50+MEN) TABS Take by mouth.    [provider]  mupirocin ointment (BACTROBAN) 2 % Apply topically. 05/17/22   [provider]  Oxycodone HCl 10 MG TABS Take 1-2 tablets (10-20 mg total) by mouth every 4 (four) hours as needed (pain). 06/10/20   Arman Bogus, MD  tiZANidine (ZANAFLEX) 4 MG tablet Take 4 mg by mouth 3 (three) times daily.  11/05/14   [provider]  triamcinolone (NASACORT) 55 MCG/ACT AERO nasal inhaler Place 2 sprays into the nose daily. 02/24/21   [name],  MD  vitamin E 180 MG (400 UNITS) capsule Take 400 Units by mouth daily.    [provider]      Allergies    Cefuroxime axetil    Review of Systems   Review of Systems  Physical Exam Updated Vital Signs BP 133/84 (BP Location: Right Arm)   Pulse 96   Temp 98.2 F (36.8 C)   Resp 20   Ht 5\' 11"  (1.803 m)   Wt 113.4 kg   SpO2 97%   BMI 34.87 kg/m  Physical Exam Vitals and nursing note reviewed.  Constitutional:      General: He is not in acute distress.    Appearance: Normal appearance.  HENT:     Head: Normocephalic and atraumatic.     Nose: Nose normal.     Mouth/Throat:     Mouth: Mucous membranes are moist.  Eyes:     Extraocular Movements: Extraocular movements intact.     Conjunctiva/sclera:  Conjunctivae normal.  Cardiovascular:     Rate and Rhythm: Normal rate.     Pulses: Normal pulses.  Pulmonary:     Effort: Pulmonary effort is normal.  Musculoskeletal:        General: Normal range of motion.     Cervical back: Normal range of motion.     Comments: No bony tenderness to palpation Palpable hematoma to left calf  Skin:    General: Skin is warm and dry.     Comments: Large skin tears to bilateral shins, no active bleeding  Neurological:     General: No focal deficit present.     Mental Status: He is alert and oriented to person, place, and time.     Sensory: No sensory deficit.     Motor: No weakness.  Psychiatric:        Mood and Affect: Mood normal.        Behavior: Behavior normal.        ED Results / Procedures / Treatments   Labs (all labs ordered are listed, but only abnormal results are displayed) Labs Reviewed - No data to display  EKG None  Radiology No results found.  Procedures Procedures    Medications Ordered in ED Medications  oxyCODONE-acetaminophen (PERCOCET/ROXICET) 5-325 MG per tablet 1 tablet (1 tablet Oral Given 01/04/23 2315)  bacitracin ointment (31.5 Applications Topical Given 01/04/23 2315)  lidocaine-EPINEPHrine-tetracaine (LET) topical gel (3 mLs Topical Given 01/04/23 2316)    ED Course/ Medical Decision Making/ A&P                                 Medical Decision Making This patient presents to the ED with chief complaint(s) of skin tears with no pertinent past medical history which further complicates the presenting complaint. The complaint involves an extensive differential diagnosis and also carries with it a high risk of complications and morbidity.    The differential diagnosis includes skin tear, laceration, hematoma, no bony tenderness making fracture dislocation unlikely, no evidence of neurovascular injury, no other traumatic injury seen on exam  Additional history obtained: Additional history obtained from  spouse Records reviewed N/A  ED Course and Reassessment: On patient's arrival he is hemodynamically stable in no acute distress and bleeding is well-controlled.  The patient does have significant skin tears to his bilateral lower extremities.  The wounds do appear to be superficial with evidence of superficial skin necrosis.  I discussed that we could attempt  some approximation of his skin with sutures but this may not be successful with how superficial his wounds are.  The patient is agreeable for wound care and will be dressed with bacitracin and nonstick dressing.  His wounds will be irrigated.  He does have a hematoma on his left shin and he will have an Ace wrap placed.  He was recommended close primary care follow-up for wound recheck.  Tetanus is up-to-date and does not require update today.  Independent labs interpretation:  N/A  Independent visualization of imaging: - N/A  Consultation: - Consulted or discussed management/test interpretation w/ external professional: N/A  Consideration for admission or further workup: Patient has no emergent conditions requiring admission or further work-up at this time and is stable for discharge home with primary care follow-up  Social Determinants of health: N/A    Risk OTC drugs. Prescription drug management.          Final Clinical Impression(s) / ED Diagnoses Final diagnoses:  Noninfected skin tear of lower extremity, unspecified laterality, initial encounter  Hematoma of left lower extremity, initial encounter    Rx / DC Orders ED Discharge Orders          Ordered    bacitracin ointment  2 times daily        01/04/23 2329              Rexford Maus, DO 01/04/23 2330

## 2023-01-04 NOTE — ED Notes (Addendum)
Let applied to areas and covered with a non-stick dsg.  Pain med given as well Will irrigate and clean wounds in 30 min

## 2023-01-04 NOTE — Discharge Instructions (Addendum)
You were seen in the emergency department for your skin tears.  These appeared to be superficial and was unlikely to benefit from stitches.  We washed out your wounds for you and placed a nonstick dressing.  You can continue to change the dressing daily and use antibiotic ointment and nonstick dressings such as ones called Xeroform with gauze wrap.  You can wash your legs in the shower with soap and water and should completely dry them afterwards.  You also have a hematoma on your left shin and you can continue to ice this, use the Ace wrap and keep your leg elevated to help with the swelling.  You should follow-up with your primary doctor in the next few days to have your wounds rechecked.  You should return to the emergency department if you are having spreading redness from your wounds, pus drainage, fevers or any other new or concerning symptoms.

## 2023-01-04 NOTE — ED Triage Notes (Signed)
Pt arrived POV for bilateral skin tears to LE, bleeding controlled, pt reports was mowing, and step down in a ditch, lost his footing and fell. Pt reports the skin tears are from the grass/dirt he slide down on the embankment. Pt is not on blood thinners. VSS, A&O x4.

## 2023-01-04 NOTE — ED Notes (Signed)
Pt was mowing his yard today and fell into a ditch.  Large bilateral skin tears to LE  Bleeding controlled

## 2023-01-17 DIAGNOSIS — M5416 Radiculopathy, lumbar region: Secondary | ICD-10-CM | POA: Diagnosis not present

## 2023-02-01 ENCOUNTER — Telehealth: Payer: Self-pay

## 2023-02-01 NOTE — Telephone Encounter (Signed)
Transition Care Management Unsuccessful Follow-up Telephone Call  Date of discharge and from where:  [facility] 9/4  Attempts:  1st Attempt  Reason for unsuccessful TCM follow-up call:  No answer/busy   Lenard Forth [place]  [facility], [facility] Guide, Phone: [phone] Website: [email or web address]

## 2023-02-02 ENCOUNTER — Telehealth: Payer: Self-pay

## 2023-02-02 NOTE — Telephone Encounter (Signed)
Transition Care Management Unsuccessful Follow-up Telephone Call  Date of discharge and from where:  [facility] 9/[facility]  Attempts:  2nd Attempt  Reason for unsuccessful TCM follow-up call:  No answer/busy   Lenard Forth [place]  [facility], [facility] Guide, Phone: [phone] Website: [email or web address]

## 2023-03-09 ENCOUNTER — Ambulatory Visit: Payer: Medicare Other

## 2023-03-09 ENCOUNTER — Encounter: Payer: Self-pay | Admitting: Internal Medicine

## 2023-03-09 ENCOUNTER — Ambulatory Visit (INDEPENDENT_AMBULATORY_CARE_PROVIDER_SITE_OTHER): Payer: Medicare Other | Admitting: Internal Medicine

## 2023-03-09 VITALS — BP 136/82 | HR 90 | Temp 99.7°F | Ht 71.0 in | Wt 253.0 lb

## 2023-03-09 DIAGNOSIS — J309 Allergic rhinitis, unspecified: Secondary | ICD-10-CM

## 2023-03-09 DIAGNOSIS — E559 Vitamin D deficiency, unspecified: Secondary | ICD-10-CM

## 2023-03-09 DIAGNOSIS — Z0001 Encounter for general adult medical examination with abnormal findings: Secondary | ICD-10-CM | POA: Diagnosis not present

## 2023-03-09 DIAGNOSIS — R739 Hyperglycemia, unspecified: Secondary | ICD-10-CM

## 2023-03-09 DIAGNOSIS — R109 Unspecified abdominal pain: Secondary | ICD-10-CM

## 2023-03-09 DIAGNOSIS — R10A2 Flank pain, left side: Secondary | ICD-10-CM | POA: Insufficient documentation

## 2023-03-09 DIAGNOSIS — Z125 Encounter for screening for malignant neoplasm of prostate: Secondary | ICD-10-CM

## 2023-03-09 DIAGNOSIS — R233 Spontaneous ecchymoses: Secondary | ICD-10-CM

## 2023-03-09 DIAGNOSIS — R079 Chest pain, unspecified: Secondary | ICD-10-CM

## 2023-03-09 DIAGNOSIS — E7800 Pure hypercholesterolemia, unspecified: Secondary | ICD-10-CM

## 2023-03-09 DIAGNOSIS — Z23 Encounter for immunization: Secondary | ICD-10-CM

## 2023-03-09 DIAGNOSIS — G8929 Other chronic pain: Secondary | ICD-10-CM | POA: Diagnosis not present

## 2023-03-09 DIAGNOSIS — R0781 Pleurodynia: Secondary | ICD-10-CM | POA: Diagnosis not present

## 2023-03-09 DIAGNOSIS — S2231XA Fracture of one rib, right side, initial encounter for closed fracture: Secondary | ICD-10-CM | POA: Diagnosis not present

## 2023-03-09 DIAGNOSIS — F17200 Nicotine dependence, unspecified, uncomplicated: Secondary | ICD-10-CM | POA: Diagnosis not present

## 2023-03-09 LAB — MICROALBUMIN / CREATININE URINE RATIO
Creatinine,U: 259 mg/dL
Microalb Creat Ratio: 1.1 mg/g (ref 0.0–30.0)
Microalb, Ur: 2.7 mg/dL — ABNORMAL HIGH (ref 0.0–1.9)

## 2023-03-09 LAB — CBC WITH DIFFERENTIAL/PLATELET
Basophils Absolute: 0.1 10*3/uL (ref 0.0–0.1)
Basophils Relative: 1 % (ref 0.0–3.0)
Eosinophils Absolute: 0.2 10*3/uL (ref 0.0–0.7)
Eosinophils Relative: 1.4 % (ref 0.0–5.0)
HCT: 46.2 % (ref 39.0–52.0)
Hemoglobin: 15.2 g/dL (ref 13.0–17.0)
Lymphocytes Relative: 18.5 % (ref 12.0–46.0)
Lymphs Abs: 2.2 10*3/uL (ref 0.7–4.0)
MCHC: 33 g/dL (ref 30.0–36.0)
MCV: 95.5 fL (ref 78.0–100.0)
Monocytes Absolute: 0.9 10*3/uL (ref 0.1–1.0)
Monocytes Relative: 7.6 % (ref 3.0–12.0)
Neutro Abs: 8.3 10*3/uL — ABNORMAL HIGH (ref 1.4–7.7)
Neutrophils Relative %: 71.5 % (ref 43.0–77.0)
Platelets: 370 10*3/uL (ref 150.0–400.0)
RBC: 4.83 Mil/uL (ref 4.22–5.81)
RDW: 14.4 % (ref 11.5–15.5)
WBC: 11.6 10*3/uL — ABNORMAL HIGH (ref 4.0–10.5)

## 2023-03-09 LAB — HEPATIC FUNCTION PANEL
ALT: 26 U/L (ref 0–53)
AST: 16 U/L (ref 0–37)
Albumin: 4.5 g/dL (ref 3.5–5.2)
Alkaline Phosphatase: 101 U/L (ref 39–117)
Bilirubin, Direct: 0.1 mg/dL (ref 0.0–0.3)
Total Bilirubin: 0.6 mg/dL (ref 0.2–1.2)
Total Protein: 6.7 g/dL (ref 6.0–8.3)

## 2023-03-09 LAB — LIPID PANEL
Cholesterol: 148 mg/dL (ref 0–200)
HDL: 55.5 mg/dL (ref >39.00)
LDL Cholesterol: 61 mg/dL (ref 0–99)
NonHDL: 92.24
Total CHOL/HDL Ratio: 3
Triglycerides: 154 mg/dL — ABNORMAL HIGH (ref 0.0–149.0)
VLDL: 30.8 mg/dL (ref 0.0–40.0)

## 2023-03-09 LAB — URINALYSIS, ROUTINE W REFLEX MICROSCOPIC
Leukocytes,Ua: NEGATIVE
Nitrite: NEGATIVE
Specific Gravity, Urine: 1.02 (ref 1.000–1.030)
Urine Glucose: NEGATIVE
Urobilinogen, UA: 1 (ref 0.0–1.0)
pH: 6.5 (ref 5.0–8.0)

## 2023-03-09 LAB — PSA: PSA: 0.71 ng/mL (ref 0.10–4.00)

## 2023-03-09 LAB — BASIC METABOLIC PANEL
BUN: 15 mg/dL (ref 6–23)
CO2: 27 meq/L (ref 19–32)
Calcium: 9.6 mg/dL (ref 8.4–10.5)
Chloride: 104 meq/L (ref 96–112)
Creatinine, Ser: 0.86 mg/dL (ref 0.40–1.50)
GFR: 93.6 mL/min (ref >60.00)
Glucose, Bld: 97 mg/dL (ref 70–99)
Potassium: 4.4 meq/L (ref 3.5–5.1)
Sodium: 140 meq/L (ref 135–145)

## 2023-03-09 LAB — VITAMIN D 25 HYDROXY (VIT D DEFICIENCY, FRACTURES): VITD: 38.7 ng/mL (ref 30.00–100.00)

## 2023-03-09 LAB — TSH: TSH: 2.07 u[IU]/mL (ref 0.35–5.50)

## 2023-03-09 LAB — HEMOGLOBIN A1C: Hgb A1c MFr Bld: 5.8 % (ref 4.6–6.5)

## 2023-03-09 NOTE — Patient Instructions (Addendum)
Please have your Shingrix (shingles) shots done at your local pharmacy, and RSV vaccine  You had the flu shot today  Please take OTC Nasacort for allergies, and mucinex for left ear fullness if needed  Please continue all other medications as before, and refills have been done if requested.  Please have the pharmacy call with any other refills you may need.  Please continue your efforts at being more active, low cholesterol diet, and weight control.  You are otherwise up to date with prevention measures today.  Please keep your appointments with your specialists as you may have planned - pain management  You will be contacted regarding the referral for: Kidney ultrasound  Please go to the XRAY Department in the first floor for the x-ray testing  Please go to the LAB at the blood drawing area for the tests to be done  You will be contacted by phone if any changes need to be made immediately.  Otherwise, you will receive a letter about your results with an explanation, but please check with MyChart first.  Please make an Appointment to return in 6 months, or sooner if needed

## 2023-03-09 NOTE — Progress Notes (Signed)
Patient ID: [name], male   DOB: 04/22/1961, 62 y.o.   MRN: [id number]         Chief Complaint:: wellness exam and Back Pain (Lower back pain due to kidney stone , also thinks he may have  broken his right rib )  , recurrent bruisin, right arm skin tear, right wound, chronic lbp, allergies, left eustachian dysfxn, left flank pain, right chest pain       HPI:  [name] is a 62 y.o. male here for wellness exam; for shingrix at pharmacy, o/w up to date                        Also Pt continues to have recurring LBP without change in severity, bowel or bladder change, fever, wt loss, but has recurrent accidents with yard work including recurring easy  bruising to extremities, right skin tear today near elbow, right pretibial large abrasion wound healing, left leg laceration recently healed, Does have several wks ongoing nasal allergy symptoms with clearish congestion, itch and sneezing, without fever, pain, ST, cough, swelling or wheezing.but has left ear popping and crackling as well.  Also has sharp and dull left flank pain somewhat sore to touch and move about.  Also has right anterolat chest wall pain with bending and twisting as well.  Still smoking, not ready to quit    Wt Readings from Last 3 Encounters:  03/09/23 253 lb (114.8 kg)  01/04/23 250 lb (113.4 kg)  09/13/22 262 lb (118.8 kg)   BP Readings from Last 3 Encounters:  03/09/23 136/82  01/04/23 133/84  09/13/22 (!) 142/86   Immunization History  Administered Date(s) Administered   Fluad Quad(high Dose 65+) 03/19/2021   Influenza Split 03/07/2012   Influenza, Seasonal, Injecte, Preservative Fre 03/09/2023   Influenza,inj,Quad PF,6+ Mos 01/13/2017, 01/23/2019, 02/21/2020, 01/26/2021   Influenza-Unspecified 05/03/2014, 01/31/2018, 03/08/2022   Moderna Sars-Covid-2 Vaccination 07/20/2019, 08/20/2019, 04/12/2020, 03/08/2022   Pfizer Covid-19 Vaccine Bivalent Booster 40yrs & up 01/26/2021   Pneumococcal  Polysaccharide-23 05/03/2008, 07/24/2020   Tdap 05/03/2008, 05/23/2018, 01/23/2019   Health Maintenance Due  Topic Date Due   Zoster Vaccines- Shingrix (1 of 2) Never done   Lung Cancer Screening  10/16/2019      Past Medical History:  Diagnosis Date   Allergy    Anxiety    Arthritis    Back   Chronic back pain    Colon polyps    COPD (chronic obstructive pulmonary disease) (HCC)    Depression    GERD (gastroesophageal reflux disease)    every now and then   Headache    Hyperlipidemia 09/26/2015   Lumbar surgical wound fluid collection    lumbar wound dehiscence   Pneumonia 2020   Past Surgical History:  Procedure Laterality Date   BACK SURGERY     COLONOSCOPY     COLONOSCOPY W/ BIOPSIES AND POLYPECTOMY     EYE SURGERY     IR US GUIDE BX ASP/DRAIN  11/18/2020   KNEE ARTHROSCOPY WITH MEDIAL MENISECTOMY Left 01/23/2016   Procedure: KNEE ARTHROSCOPY WITH MEDIAL MENISECTOMY;  Surgeon: Marcene Corning, MD;  Location: [facility];  Service: Orthopedics;  Laterality: Left;   LUMBAR LAMINECTOMY/DECOMPRESSION MICRODISCECTOMY N/A 06/09/2020   Procedure: Decompressive laminectomy Lumbarone -two;  Surgeon: Tia Alert, MD;  Location: [facility];  Service: Neurosurgery;  Laterality: N/A;   LUMBAR WOUND DEBRIDEMENT N/A 03/21/2014   Procedure: LUMBAR WOUND DEBRIDEMENT;  Surgeon: Tia Alert,  MD;  Location: [facility];  Service: Neurosurgery;  Laterality: N/A;   MAXIMUM ACCESS (MAS)POSTERIOR LUMBAR INTERBODY FUSION (PLIF) 1 LEVEL N/A 02/14/2014   Procedure: FOR MAXIMUM ACCESS SURGERY POSTERIOR LUMBAR INTERBODY FUSION LUMBAR TWO-THREE;  Surgeon: Tia Alert, MD;  Location: [facility];  Service: Neurosurgery;  Laterality: N/A;   NECK SURGERY     SHOULDER SURGERY     SPINE SURGERY     TONSILLECTOMY      reports that he has been smoking cigarettes. He has a 22 pack-year smoking history. He has quit using smokeless tobacco.  His smokeless tobacco use included snuff. He  reports current alcohol use of about 8.0 standard drinks of alcohol per week. He reports that he does not use drugs. family history includes Aneurysm in his maternal grandfather; Aneurysm (age of onset: 77) in his maternal aunt; Colon cancer in his maternal grandfather; Colon cancer (age of onset: 39) in his father; Colon polyps in his brother; Depression (age of onset: 32) in his brother; Heart disease in his maternal aunt; Hypertension in his mother; Stomach cancer in his father; Stomach cancer (age of onset: 74) in his paternal uncle; Stomach cancer (age of onset: 25) in his paternal uncle; Stomach cancer (age of onset: 84) in his paternal grandfather; Suicidality in his brother. Allergies  Allergen Reactions   Cefuroxime Axetil Hives   Current Outpatient Medications on File Prior to Visit  Medication Sig Dispense Refill   acetaminophen (TYLENOL) 325 MG tablet Take 650 mg by mouth every 6 (six) hours as needed for mild pain, fever or headache.     albuterol (VENTOLIN HFA) 108 (90 Base) MCG/ACT inhaler Inhale 1-2 puffs into the lungs every 6 (six) hours as needed for wheezing or shortness of breath. 8.5 each 11   ascorbic acid (VITAMIN C) 500 MG tablet Take 500 mg by mouth daily.     atorvastatin (LIPITOR) 10 MG tablet TAKE 1 TABLET BY MOUTH EVERY DAY 90 tablet 3   bacitracin ointment Apply 1 Application topically 2 (two) times daily. 120 g 0   Cholecalciferol (VITAMIN D-3) 25 MCG (1000 UT) CAPS Take by mouth daily.     cyanocobalamin (VITAMIN B12) 1000 MCG tablet Take 1,000 mcg by mouth daily.     DULoxetine (CYMBALTA) 60 MG capsule Take 60 mg by mouth at bedtime.  2   folic acid (FOLVITE) 800 MCG tablet Take 400 mcg by mouth daily.     gabapentin (NEURONTIN) 300 MG capsule Take 1 capsule (300 mg total) by mouth 3 (three) times daily. 90 capsule 3   meloxicam (MOBIC) 15 MG tablet Take 1 tablet (15 mg total) by mouth daily. 90 tablet 3   methylPREDNISolone (MEDROL DOSEPAK) 4 MG TBPK tablet Take  6 tablets on day 1.  Take 5 tablets on day 2.  Take 4 tablets on day 3.  Take 3 tablets on day 4.  Take 2 tablets on day 5.  Take 1 tablet on day 6. 21 tablet 0   Multiple Vitamins-Minerals (CENTRUM SILVER 50+MEN) TABS Take by mouth.     mupirocin ointment (BACTROBAN) 2 % Apply topically.     tiZANidine (ZANAFLEX) 4 MG tablet Take 4 mg by mouth 3 (three) times daily.   2   triamcinolone (NASACORT) 55 MCG/ACT AERO nasal inhaler Place 2 sprays into the nose daily. 1 each 12   vitamin E 180 MG (400 UNITS) capsule Take 400 Units by mouth daily.     Oxycodone HCl 10 MG TABS  Take 1-2 tablets (10-20 mg total) by mouth every 4 (four) hours as needed (pain). (Patient not taking: Reported on 03/09/2023) 40 tablet 0   No current facility-administered medications on file prior to visit.        ROS:  All others reviewed and negative.  Objective        PE:  BP 136/82 (BP Location: Right Arm, Patient Position: Sitting, Cuff Size: Normal)   Pulse 90   Temp 99.7 F (37.6 C) (Oral)   Ht 5\' 11"  (1.803 m)   Wt 253 lb (114.8 kg)   SpO2 98%   BMI 35.29 kg/m                 Constitutional: Pt appears in NAD               HENT: Head: NCAT.                Right Ear: External ear normal.                 Left Ear: External ear normal.                Eyes: . Pupils are equal, round, and reactive to light. Conjunctivae and EOM are normal               Nose: without d/c or deformity               Neck: Neck supple. Gross normal ROM               Cardiovascular: Normal rate and regular rhythm.                 Pulmonary/Chest: Effort normal and breath sounds without rales or wheezing.                Abd:  Soft, NT, ND, + BS, no organomegaly               Neurological: Pt is alert. At baseline orientation, motor grossly intact               Skin: Skin is warm. No rashes, no other new lesions, LE edema - none               Psychiatric: Pt behavior is normal without agitation   Micro: none  Cardiac tracings I  have personally interpreted today:  none  Pertinent Radiological findings (summarize): none   Lab Results  Component Value Date   WBC 11.6 (H) 03/09/2023   HGB 15.2 03/09/2023   HCT 46.2 03/09/2023   PLT 370.0 03/09/2023   GLUCOSE 97 03/09/2023   CHOL 148 03/09/2023   TRIG 154.0 (H) 03/09/2023   HDL 55.50 03/09/2023   LDLDIRECT 150.1 03/07/2012   LDLCALC 61 03/09/2023   ALT 26 03/09/2023   AST 16 03/09/2023   NA 140 03/09/2023   K 4.4 03/09/2023   CL 104 03/09/2023   CREATININE 0.86 03/09/2023   BUN 15 03/09/2023   CO2 27 03/09/2023   TSH 2.07 03/09/2023   PSA 0.71 03/09/2023   INR 0.9 07/13/2022   HGBA1C 5.8 03/09/2023   MICROALBUR 2.7 (H) 03/09/2023   Assessment/Plan:  Nacoma Caraveo Fedder is a 62 y.o. White or Caucasian [1] male with  has a past medical history of Allergy, Anxiety, Arthritis, Chronic back pain, Colon polyps, COPD (chronic obstructive pulmonary disease) (HCC), Depression, GERD (gastroesophageal reflux disease), Headache, Hyperlipidemia (09/26/2015), Lumbar surgical wound fluid collection, and Pneumonia (2020).  Encounter for well adult exam with abnormal findings Age and sex appropriate education and counseling updated with regular exercise and diet Referrals for preventative services - none needed Immunizations addressed - for shingrix at pharmacy Smoking counseling  - pt counsled to quit, pt not ready Evidence for depression or other mood disorder - none significant Most recent labs reviewed. I have personally reviewed and have noted: 1) the patient's medical and social history 2) The patient's current medications and supplements 3) The patient's height, weight, and BMI have been recorded in the chart   Left flank pain I suspect msk, but can't r/o underlying issue - for ua with labs, and renal u/s r/o hydro due to stone  Smoker Pt counsled to quit, pt not ready  Hyperlipidemia Lab Results  Component Value Date   LDLCALC 61 03/09/2023    Stable, pt to continue current statin lipitor 10 qd   Hyperglycemia Lab Results  Component Value Date   HGBA1C 5.8 03/09/2023   Stable, pt to continue current medical treatment  - diet, wt control   Easy bruising Consider tertiary care heme referral, declines for now  Other chronic pain Pt to continue pain management f/u  Allergic rhinitis Mild to mod, for nasacort asd,  to f/u any worsening symptoms or concerns  Chest pain Right anterolateral - for cxr, rib films but suspect msk strain  Followup: Return in about 6 months (around 09/06/2023).  Oliver Barre, MD 03/11/2023 8:16 PM [facility]  Primary Care - [facility]

## 2023-03-10 ENCOUNTER — Ambulatory Visit
Admission: RE | Admit: 2023-03-10 | Discharge: 2023-03-10 | Disposition: A | Discharge status: Home / Self Care | Payer: Medicare Other | Source: Ambulatory Visit | Attending: Internal Medicine | Admitting: Internal Medicine

## 2023-03-10 DIAGNOSIS — R109 Unspecified abdominal pain: Secondary | ICD-10-CM

## 2023-03-10 DIAGNOSIS — N200 Calculus of kidney: Secondary | ICD-10-CM | POA: Diagnosis not present

## 2023-03-11 ENCOUNTER — Encounter: Payer: Self-pay | Admitting: Internal Medicine

## 2023-03-11 DIAGNOSIS — R079 Chest pain, unspecified: Secondary | ICD-10-CM | POA: Insufficient documentation

## 2023-03-11 NOTE — Assessment & Plan Note (Signed)
Lab Results  Component Value Date   LDLCALC 61 03/09/2023   Stable, pt to continue current statin lipitor 10 qd

## 2023-03-11 NOTE — Assessment & Plan Note (Signed)
Mild to mod, for nasacort asd,  to f/u any worsening symptoms or concerns 

## 2023-03-11 NOTE — Assessment & Plan Note (Signed)
Lab Results  Component Value Date   HGBA1C 5.8 03/09/2023   Stable, pt to continue current medical treatment  - diet, wt control

## 2023-03-11 NOTE — Assessment & Plan Note (Signed)
Consider tertiary care heme referral, declines for now

## 2023-03-11 NOTE — Assessment & Plan Note (Signed)
Right anterolateral - for cxr, rib films but suspect msk strain

## 2023-03-11 NOTE — Assessment & Plan Note (Signed)
Pt counsled to quit, pt not ready °

## 2023-03-11 NOTE — Assessment & Plan Note (Signed)
Pt to continue pain management f/u

## 2023-03-11 NOTE — Assessment & Plan Note (Signed)
I suspect msk, but can't r/o underlying issue - for ua with labs, and renal u/s r/o hydro due to stone

## 2023-03-11 NOTE — Assessment & Plan Note (Signed)
Age and sex appropriate education and counseling updated with regular exercise and diet Referrals for preventative services - none needed Immunizations addressed - for shingrix at pharmacy Smoking counseling  - pt counsled to quit, pt not ready Evidence for depression or other mood disorder - none significant Most recent labs reviewed. I have personally reviewed and have noted: 1) the patient's medical and social history 2) The patient's current medications and supplements 3) The patient's height, weight, and BMI have been recorded in the chart

## 2023-03-22 ENCOUNTER — Encounter: Payer: Medicare Other | Admitting: Internal Medicine

## 2023-04-04 ENCOUNTER — Telehealth: Payer: Self-pay | Admitting: Internal Medicine

## 2023-04-04 DIAGNOSIS — M5416 Radiculopathy, lumbar region: Secondary | ICD-10-CM | POA: Diagnosis not present

## 2023-04-04 DIAGNOSIS — M7061 Trochanteric bursitis, right hip: Secondary | ICD-10-CM | POA: Diagnosis not present

## 2023-04-04 DIAGNOSIS — N200 Calculus of kidney: Secondary | ICD-10-CM

## 2023-04-04 NOTE — Telephone Encounter (Signed)
See below

## 2023-04-04 NOTE — Telephone Encounter (Signed)
Patient would like a referral to a urologist - he has spoken to Dr. Jonny Ruiz about this.

## 2023-04-04 NOTE — Telephone Encounter (Signed)
Ok referral done 

## 2023-04-06 DIAGNOSIS — X32XXXD Exposure to sunlight, subsequent encounter: Secondary | ICD-10-CM | POA: Diagnosis not present

## 2023-04-06 DIAGNOSIS — C44629 Squamous cell carcinoma of skin of left upper limb, including shoulder: Secondary | ICD-10-CM | POA: Diagnosis not present

## 2023-04-06 DIAGNOSIS — L570 Actinic keratosis: Secondary | ICD-10-CM | POA: Diagnosis not present

## 2023-04-12 NOTE — Progress Notes (Unsigned)
[name] [address] [place] [address] Phone: [phone] Subjective:   [name], am serving as a scribe for Dr. Antoine [name].  I'm seeing this patient by the request  of:  Richard Levins, MD  CC: Bilateral knee and back pain  [id number]  06/17/2022 Bilateral injections given, discussed placing patient in home exercises, which activities to do.  Hopeful that this chronic problem with worsening symptoms does improve.  Follow-up again in 2 months     Update 04/13/2023 [name] is a 62 y.o. male coming in with complaint of B knee and neck pain. Saw Dr. Jean [name] since last visit. Patient states that injections helped his knees in February. Pain is throughout the entire joint with movement. Using Tylenol for pain relief.   Pain in R side of neck with rotation. Pain stays in the trap and sometimes radiates into shoulder. Getting epidurals from [facility] in lower back.        Past Medical History:  Diagnosis Date   Allergy    Anxiety    Arthritis    Back   Chronic back pain    Colon polyps    COPD (chronic obstructive pulmonary disease) (HCC)    Depression    GERD (gastroesophageal reflux disease)    every now and then   Headache    Hyperlipidemia 09/26/2015   Lumbar surgical wound fluid collection    lumbar wound dehiscence   Pneumonia 2020   Past Surgical History:  Procedure Laterality Date   BACK SURGERY     COLONOSCOPY     COLONOSCOPY W/ BIOPSIES AND POLYPECTOMY     EYE SURGERY     IR US GUIDE BX ASP/DRAIN  11/18/2020   KNEE ARTHROSCOPY WITH MEDIAL MENISECTOMY Left 01/23/2016   Procedure: KNEE ARTHROSCOPY WITH MEDIAL MENISECTOMY;  Surgeon: Richard Corning, MD;  Location: [facility];  Service: Orthopedics;  Laterality: Left;   LUMBAR LAMINECTOMY/DECOMPRESSION MICRODISCECTOMY N/A 06/09/2020   Procedure: Decompressive laminectomy Lumbarone -two;  Surgeon: Richard Alert, MD;  Location: [facility];   Service: Neurosurgery;  Laterality: N/A;   LUMBAR WOUND DEBRIDEMENT N/A 03/21/2014   Procedure: LUMBAR WOUND DEBRIDEMENT;  Surgeon: Richard Alert, MD;  Location: [facility];  Service: Neurosurgery;  Laterality: N/A;   MAXIMUM ACCESS (MAS)POSTERIOR LUMBAR INTERBODY FUSION (PLIF) 1 LEVEL N/A 02/14/2014   Procedure: FOR MAXIMUM ACCESS SURGERY POSTERIOR LUMBAR INTERBODY FUSION LUMBAR TWO-THREE;  Surgeon: Richard Alert, MD;  Location: [facility];  Service: Neurosurgery;  Laterality: N/A;   NECK SURGERY     SHOULDER SURGERY     SPINE SURGERY     TONSILLECTOMY     Social History   Socioeconomic History   Marital status: Married    Spouse name: cindy   Number of children: Not on file   Years of education: Not on file   Highest education level: Not on file  Occupational History   Occupation: produce manager/ disability    Employer: [employer]  Tobacco Use   Smoking status: Every Day    Current packs/day: 0.50    Average packs/day: 0.5 packs/day for 44.0 years (22.0 ttl pk-yrs)    Types: Cigarettes   Smokeless tobacco: Former    Types: Snuff  Vaping Use   Vaping status: Never Used  Substance and Sexual Activity   Alcohol use: Yes    Alcohol/week: 8.0 standard drinks of alcohol    Types: 8 Standard drinks or equivalent  per week    Comment: social beer   Drug use: No   Sexual activity: Yes    Partners: Female  Other Topics Concern   Not on file  Social History Narrative   Exercise--no   Social Determinants of Health   Financial Resource Strain: Low Risk  (07/08/2022)   Overall Financial Resource Strain (CARDIA)    Difficulty of Paying Living Expenses: Not hard at all  Food Insecurity: No Food Insecurity (07/13/2022)   Hunger Vital Sign    Worried About Running Out of Food in the Last Year: Never true    Ran Out of Food in the Last Year: Never true  Transportation Needs: No Transportation Needs (07/13/2022)   PRAPARE - Administrator, Civil Service (Medical): No     Lack of Transportation (Non-Medical): No  Physical Activity: Sufficiently Active (07/08/2022)   Exercise Vital Sign    Days of Exercise per Week: 5 days    Minutes of Exercise per Session: 30 min  Stress: No Stress Concern Present (07/08/2022)   [employer] of Occupational Health - Occupational Stress Questionnaire    Feeling of Stress : Not at all  Social Connections: Unknown (07/08/2022)   Social Connection and Isolation Panel [NHANES]    Frequency of Communication with Friends and Family: More than three times a week    Frequency of Social Gatherings with Friends and Family: More than three times a week    Attends Religious Services: Patient declined    Database administrator or Organizations: Patient declined    Attends Engineer, structural: Patient declined    Marital Status: Married   Allergies  Allergen Reactions   Cefuroxime Axetil Hives   Family History  Problem Relation Age of Onset   Colon cancer Father 77       pt thinks father had colon cancer   Stomach cancer Father    Hypertension Mother    Colon polyps Brother    Aneurysm Maternal Aunt 58       brain   Heart disease Maternal Aunt        d 73   Stomach cancer Paternal Uncle 57   Aneurysm Maternal Grandfather        stomach   Colon cancer Maternal Grandfather        thinks grandfather had colon cancer   Stomach cancer Paternal Grandfather 58   Depression Brother 20   Suicidality Brother    Stomach cancer Paternal Uncle 19   Esophageal cancer Neg Hx    Rectal cancer Neg Hx    Diabetes Neg Hx     Current Outpatient Medications (Endocrine & Metabolic):    methylPREDNISolone (MEDROL DOSEPAK) 4 MG TBPK tablet, Take 6 tablets on day 1.  Take 5 tablets on day 2.  Take 4 tablets on day 3.  Take 3 tablets on day 4.  Take 2 tablets on day 5.  Take 1 tablet on day 6.  Current Outpatient Medications (Cardiovascular):    atorvastatin (LIPITOR) 10 MG tablet, TAKE 1 TABLET BY MOUTH EVERY DAY  Current  Outpatient Medications (Respiratory):    albuterol (VENTOLIN HFA) 108 (90 Base) MCG/ACT inhaler, Inhale 1-2 puffs into the lungs every 6 (six) hours as needed for wheezing or shortness of breath.   triamcinolone (NASACORT) 55 MCG/ACT AERO nasal inhaler, Place 2 sprays into the nose daily.  Current Outpatient Medications (Analgesics):    acetaminophen (TYLENOL) 325 MG tablet, Take 650 mg by mouth every 6 (six)  hours as needed for mild pain, fever or headache.   meloxicam (MOBIC) 15 MG tablet, Take 1 tablet (15 mg total) by mouth daily.   Oxycodone HCl 10 MG TABS, Take 1-2 tablets (10-20 mg total) by mouth every 4 (four) hours as needed (pain). (Patient not taking: Reported on 03/09/2023)  Current Outpatient Medications (Hematological):    cyanocobalamin (VITAMIN B12) 1000 MCG tablet, Take 1,000 mcg by mouth daily.   folic acid (FOLVITE) 800 MCG tablet, Take 400 mcg by mouth daily.  Current Outpatient Medications (Other):    ascorbic acid (VITAMIN C) 500 MG tablet, Take 500 mg by mouth daily.   bacitracin ointment, Apply 1 Application topically 2 (two) times daily.   Cholecalciferol (VITAMIN D-3) 25 MCG (1000 UT) CAPS, Take by mouth daily.   DULoxetine (CYMBALTA) 60 MG capsule, Take 60 mg by mouth at bedtime.   gabapentin (NEURONTIN) 300 MG capsule, Take 1 capsule (300 mg total) by mouth 3 (three) times daily.   Multiple Vitamins-Minerals (CENTRUM SILVER 50+MEN) TABS, Take by mouth.   mupirocin ointment (BACTROBAN) 2 %, Apply topically.   tiZANidine (ZANAFLEX) 4 MG tablet, Take 4 mg by mouth 3 (three) times daily.    vitamin E 180 MG (400 UNITS) capsule, Take 400 Units by mouth daily.   Reviewed prior external information including notes and imaging from  primary care provider As well as notes that were available from care everywhere and other healthcare systems.  Past medical history, social, surgical and family history all reviewed in electronic medical record.  No pertanent information  unless stated regarding to the chief complaint.   Review of Systems:  No headache, visual changes, nausea, vomiting, diarrhea, constipation, dizziness, abdominal pain, skin rash, fevers, chills, night sweats, weight loss, swollen lymph nodes, body aches, joint swelling, chest pain, shortness of breath, mood changes. POSITIVE muscle aches  Objective  Blood pressure 128/82, pulse 96, height 5\' 11"  (1.803 m), weight 262 lb (118.8 kg), SpO2 96%.   General: No apparent distress [name] and oriented x3 mood and affect normal, dressed appropriately.  HEENT: Pupils equal, extraocular movements intact  Respiratory: Patient's speak in full sentences and does not appear short of breath  Cardiovascular: No lower extremity edema, non tender, no erythema multiple bruises noted on his knees today. Back exam does have loss of lordosis noted.  Does have significant limitation in range of motion. Knee exam does have some crepitus noted.  Some instability with valgus and varus force.  Trace effusion noted.  After informed written and verbal consent, patient was seated on exam table. Right knee was prepped with alcohol swab and utilizing anterolateral approach, patient's right knee space was injected with 4:1  marcaine 0.5%: Kenalog 40mg /dL. Patient tolerated the procedure well without immediate complications.  After informed written and verbal consent, patient was seated on exam table. Left knee was prepped with alcohol swab and utilizing anterolateral approach, patient's left knee space was injected with 4:1  marcaine 0.5%: Kenalog 40mg /dL. Patient tolerated the procedure well without immediate complications.   Impression and Recommendations:    The above documentation has been reviewed and is accurate and complete Judi Saa, DO

## 2023-04-13 ENCOUNTER — Encounter: Payer: Self-pay | Admitting: Family Medicine

## 2023-04-13 ENCOUNTER — Ambulatory Visit: Payer: Medicare Other | Admitting: Family Medicine

## 2023-04-13 VITALS — BP 128/82 | HR 96 | Ht 71.0 in | Wt 262.0 lb

## 2023-04-13 DIAGNOSIS — M170 Bilateral primary osteoarthritis of knee: Secondary | ICD-10-CM | POA: Diagnosis not present

## 2023-04-13 NOTE — Assessment & Plan Note (Signed)
Was called to follow with exacerbation.  Patient has had a difficult year at the moment with all his different injuries when reviewing patient's chart.  Patient seems to be now coping with it and is here to focus more on his knees and back pain again.  Attempted the knee injections today and hopeful that this will make some improvement.  Has responded to viscosupplementation again to see if he can have approval.  Follow-up with me again in 6 to 8 weeks

## 2023-04-13 NOTE — Patient Instructions (Signed)
Injected both knees today New Year New You See me again in 6 weeks

## 2023-05-09 DIAGNOSIS — M5416 Radiculopathy, lumbar region: Secondary | ICD-10-CM | POA: Diagnosis not present

## 2023-05-11 ENCOUNTER — Other Ambulatory Visit: Payer: Self-pay | Admitting: Internal Medicine

## 2023-05-11 ENCOUNTER — Other Ambulatory Visit: Payer: Self-pay

## 2023-05-11 DIAGNOSIS — X32XXXD Exposure to sunlight, subsequent encounter: Secondary | ICD-10-CM | POA: Diagnosis not present

## 2023-05-11 DIAGNOSIS — L570 Actinic keratosis: Secondary | ICD-10-CM | POA: Diagnosis not present

## 2023-05-11 DIAGNOSIS — L820 Inflamed seborrheic keratosis: Secondary | ICD-10-CM | POA: Diagnosis not present

## 2023-05-11 DIAGNOSIS — Z85828 Personal history of other malignant neoplasm of skin: Secondary | ICD-10-CM | POA: Diagnosis not present

## 2023-05-11 DIAGNOSIS — Z08 Encounter for follow-up examination after completed treatment for malignant neoplasm: Secondary | ICD-10-CM | POA: Diagnosis not present

## 2023-05-19 ENCOUNTER — Ambulatory Visit: Payer: Self-pay | Admitting: Internal Medicine

## 2023-05-19 NOTE — Telephone Encounter (Signed)
Chief Complaint: Skin tear with possible infection Symptoms: Skin tear with appearance of infection, yellow drainage Frequency: constant since Christmas Disposition: [] ED /[] Urgent Care (no appt availability in office) / [x] Appointment(In office/virtual)/ []  [facility]/ [] Home Care/ [] Refused Recommended Disposition /[] [facility]/ []  Follow-up with PCP Additional Notes: Patient called in stating he was advised to follow up with PCP by his Home Health Nurse. Patient states he hit the outside of his right ankle on a bolt when getting out of his truck around Christmas time. This tore his skin open. Patient states he was keeping it clean and covered, but the scab would get removed when he would remove dressing. Patient states he is still keeping it clean, but there is yellow discharge and [facility] nurse states it appears infected. Patient is unsure of being vaccinated for Tetanus shot. Patient appointment made for tomorrow in-office for evaluation.   Copied from CRM [id number]. Topic: Clinical - Red Word Triage >> May 19, 2023  1:59 PM Almira Coaster wrote: Red Word that prompted transfer to Nurse Triage: Patient with a gash on the right leg since Christmas, surrounding bruised, home health nurse stated greenish and possible infection. Reason for Disposition  No prior tetanus shots (or is not fully vaccinated)  Answer Assessment - Initial Assessment Questions 1. APPEARANCE of INJURY: "What does the injury look like?"     Right above ankle on right side of right leg, scabbed over some with some leakage 2. SIZE: "How large is the cut?"      "Probably about two inches long, shaped like a triangle" 3. BLEEDING: "Is it bleeding now?" If Yes, ask: "Is it difficult to stop?"      No 4. LOCATION: "Where is the injury located?"      Right side of right leg above ankle 5. ONSET: "How long ago did the injury occur?"      Christmas  6. MECHANISM: "Tell me how it happened."      Was stepping out of  truck and hit leg on bolt and tore skin off 7. TETANUS: "When was the last tetanus booster?"     I don't know, I don't think I've had one  Protocols used: Cuts and Lacerations-A-AH

## 2023-05-20 ENCOUNTER — Ambulatory Visit (INDEPENDENT_AMBULATORY_CARE_PROVIDER_SITE_OTHER): Payer: Medicare Other | Admitting: Family Medicine

## 2023-05-20 ENCOUNTER — Encounter: Payer: Self-pay | Admitting: Family Medicine

## 2023-05-20 VITALS — BP 128/80 | HR 85 | Temp 97.8°F | Ht 71.0 in

## 2023-05-20 DIAGNOSIS — R296 Repeated falls: Secondary | ICD-10-CM

## 2023-05-20 DIAGNOSIS — S81801A Unspecified open wound, right lower leg, initial encounter: Secondary | ICD-10-CM | POA: Diagnosis not present

## 2023-05-20 DIAGNOSIS — S61412A Laceration without foreign body of left hand, initial encounter: Secondary | ICD-10-CM

## 2023-05-20 DIAGNOSIS — T148XXA Other injury of unspecified body region, initial encounter: Secondary | ICD-10-CM

## 2023-05-20 DIAGNOSIS — L089 Local infection of the skin and subcutaneous tissue, unspecified: Secondary | ICD-10-CM

## 2023-05-20 MED ORDER — CLINDAMYCIN HCL 300 MG PO CAPS: 300.0000 mg | ORAL_CAPSULE | Freq: Three times a day (TID) | ORAL | 0 refills | Status: DC

## 2023-05-20 NOTE — Progress Notes (Signed)
Subjective:     Patient ID: [name], male    DOB: [birth year], 63 y.o.   MRN: [id number]  Chief Complaint  Patient presents with   Wound Check    Right leg tear, HH nurse states it may be infected     Wound Check     History of Present Illness          Here with c/o RLE wound infection. States he hit his leg on his truck door Christmas night.  He also has a wound on his left hand that appears to be healing and not infected.  States this wound occurred at the same time. States at home Medicare wellness visit took place and the provider advised him that he needed to be seen due to the leg wound.  That prompted his call for today's visit.  He has multiple scabs on his right lower leg and states those were from a different fall a few days before Christmas.  He was seen in the ED for wounds of bilateral lower extremities in September.   He reports his falls are related to alcohol use.  States since Christmas he has only had 3 drinks and is cutting back.    Health Maintenance Due  Topic Date Due   Zoster Vaccines- Shingrix (1 of 2) Never done   Lung Cancer Screening  10/16/2019   Pneumococcal Vaccine 73-63 Years old (2 of 2 - PCV) 07/24/2021   COVID-19 Vaccine (6 - 2024-25 season) 01/02/2023   Medicare Annual Wellness (AWV)  07/08/2023    Past Medical History:  Diagnosis Date   Allergy    Anxiety    Arthritis    Back   Chronic back pain    Colon polyps    COPD (chronic obstructive pulmonary disease) (HCC)    Depression    GERD (gastroesophageal reflux disease)    every now and then   Headache    Hyperlipidemia 09/26/2015   Lumbar surgical wound fluid collection    lumbar wound dehiscence   Pneumonia 2020    Past Surgical History:  Procedure Laterality Date   BACK SURGERY     COLONOSCOPY     COLONOSCOPY W/ BIOPSIES AND POLYPECTOMY     EYE SURGERY     IR US GUIDE BX ASP/DRAIN  11/18/2020   KNEE ARTHROSCOPY WITH MEDIAL MENISECTOMY Left 01/23/2016    Procedure: KNEE ARTHROSCOPY WITH MEDIAL MENISECTOMY;  Surgeon: Marcene Corning, MD;  Location: [facility];  Service: Orthopedics;  Laterality: Left;   LUMBAR LAMINECTOMY/DECOMPRESSION MICRODISCECTOMY N/A 06/09/2020   Procedure: Decompressive laminectomy Lumbarone -two;  Surgeon: Tia Alert, MD;  Location: [facility] OR;  Service: Neurosurgery;  Laterality: N/A;   LUMBAR WOUND DEBRIDEMENT N/A 03/21/2014   Procedure: LUMBAR WOUND DEBRIDEMENT;  Surgeon: Tia Alert, MD;  Location: [facility];  Service: Neurosurgery;  Laterality: N/A;   MAXIMUM ACCESS (MAS)POSTERIOR LUMBAR INTERBODY FUSION (PLIF) 1 LEVEL N/A 02/14/2014   Procedure: FOR MAXIMUM ACCESS SURGERY POSTERIOR LUMBAR INTERBODY FUSION LUMBAR TWO-THREE;  Surgeon: Tia Alert, MD;  Location: [facility];  Service: Neurosurgery;  Laterality: N/A;   NECK SURGERY     SHOULDER SURGERY     SPINE SURGERY     TONSILLECTOMY      Family History  Problem Relation Age of Onset   Colon cancer Father 33       pt thinks father had colon cancer   Stomach cancer Father    Hypertension Mother  Colon polyps Brother    Aneurysm Maternal Aunt 58       brain   Heart disease Maternal Aunt        d 19   Stomach cancer Paternal Uncle 57   Aneurysm Maternal Grandfather        stomach   Colon cancer Maternal Grandfather        thinks grandfather had colon cancer   Stomach cancer Paternal Grandfather 58   Depression Brother 20   Suicidality Brother    Stomach cancer Paternal Uncle 48   Esophageal cancer Neg Hx    Rectal cancer Neg Hx    Diabetes Neg Hx     Social History   Socioeconomic History   Marital status: Married    Spouse name: cindy   Number of children: Not on file   Years of education: Not on file   Highest education level: Not on file  Occupational History   Occupation: produce manager/ disability    Employer: [employer]  Tobacco Use   Smoking status: Every Day    Current packs/day: 0.50    Average  packs/day: 0.5 packs/day for 44.0 years (22.0 ttl pk-yrs)    Types: Cigarettes   Smokeless tobacco: Former    Types: Snuff  Vaping Use   Vaping status: Never Used  Substance and Sexual Activity   Alcohol use: Yes    Alcohol/week: 8.0 standard drinks of alcohol    Types: 8 Standard drinks or equivalent per week    Comment: social beer   Drug use: No   Sexual activity: Yes    Partners: Female  Other Topics Concern   Not on file  Social History Narrative   Exercise--no   Social Drivers of Health   Financial Resource Strain: Low Risk  (07/08/2022)   Overall Financial Resource Strain (CARDIA)    Difficulty of Paying Living Expenses: Not hard at all  Food Insecurity: No Food Insecurity (07/13/2022)   Hunger Vital Sign    Worried About Running Out of Food in the Last Year: Never true    Ran Out of Food in the Last Year: Never true  Transportation Needs: No Transportation Needs (07/13/2022)   PRAPARE - Administrator, Civil Service (Medical): No    Lack of Transportation (Non-Medical): No  Physical Activity: Sufficiently Active (07/08/2022)   Exercise Vital Sign    Days of Exercise per Week: 5 days    Minutes of Exercise per Session: 30 min  Stress: No Stress Concern Present (07/08/2022)   [employer] of Occupational Health - Occupational Stress Questionnaire    Feeling of Stress : Not at all  Social Connections: Unknown (07/08/2022)   Social Connection and Isolation Panel [NHANES]    Frequency of Communication with Friends and Family: More than three times a week    Frequency of Social Gatherings with Friends and Family: More than three times a week    Attends Religious Services: Patient declined    Database administrator or Organizations: Patient declined    Attends Banker Meetings: Patient declined    Marital Status: Married  Catering manager Violence: Not At Risk (07/13/2022)   Humiliation, Afraid, Rape, and Kick questionnaire    Fear of Current or  Ex-Partner: No    Emotionally Abused: No    Physically Abused: No    Sexually Abused: No    Outpatient Medications Prior to Visit  Medication Sig Dispense Refill   acetaminophen (TYLENOL) 325 MG tablet  Take 650 mg by mouth every 6 (six) hours as needed for mild pain, fever or headache.     albuterol (VENTOLIN HFA) 108 (90 Base) MCG/ACT inhaler Inhale 1-2 puffs into the lungs every 6 (six) hours as needed for wheezing or shortness of breath. 8.5 each 11   ascorbic acid (VITAMIN C) 500 MG tablet Take 500 mg by mouth daily.     atorvastatin (LIPITOR) 10 MG tablet TAKE 1 TABLET BY MOUTH EVERY DAY 90 tablet 3   bacitracin ointment Apply 1 Application topically 2 (two) times daily. 120 g 0   Cholecalciferol (VITAMIN D-3) 25 MCG (1000 UT) CAPS Take by mouth daily.     cyanocobalamin (VITAMIN B12) 1000 MCG tablet Take 1,000 mcg by mouth daily.     DULoxetine (CYMBALTA) 60 MG capsule Take 60 mg by mouth at bedtime.  2   folic acid (FOLVITE) 800 MCG tablet Take 400 mcg by mouth daily.     gabapentin (NEURONTIN) 300 MG capsule Take 1 capsule (300 mg total) by mouth 3 (three) times daily. 90 capsule 3   meloxicam (MOBIC) 15 MG tablet Take 1 tablet (15 mg total) by mouth daily. 90 tablet 3   Multiple Vitamins-Minerals (CENTRUM SILVER 50+MEN) TABS Take by mouth.     tiZANidine (ZANAFLEX) 4 MG tablet Take 4 mg by mouth 3 (three) times daily.   2   triamcinolone (NASACORT) 55 MCG/ACT AERO nasal inhaler Place 2 sprays into the nose daily. 1 each 12   vitamin E 180 MG (400 UNITS) capsule Take 400 Units by mouth daily.     methylPREDNISolone (MEDROL DOSEPAK) 4 MG TBPK tablet Take 6 tablets on day 1.  Take 5 tablets on day 2.  Take 4 tablets on day 3.  Take 3 tablets on day 4.  Take 2 tablets on day 5.  Take 1 tablet on day 6. (Patient not taking: Reported on 05/20/2023) 21 tablet 0   mupirocin ointment (BACTROBAN) 2 % Apply topically. (Patient not taking: Reported on 05/20/2023)     Oxycodone HCl 10 MG TABS Take  1-2 tablets (10-20 mg total) by mouth every 4 (four) hours as needed (pain). (Patient not taking: Reported on 05/20/2023) 40 tablet 0   No facility-administered medications prior to visit.    Allergies  Allergen Reactions   Cefuroxime Axetil Hives    Review of Systems  Constitutional:  Negative for chills and fever.  Respiratory:  Negative for shortness of breath.   Cardiovascular:  Positive for leg swelling. Negative for chest pain and palpitations.  Gastrointestinal:  Negative for abdominal pain, constipation, diarrhea, nausea and vomiting.  Musculoskeletal:  Positive for back pain.  Neurological:  Negative for dizziness, focal weakness and headaches.       Objective:    Physical Exam   BP 128/80 (BP Location: Left Arm, Patient Position: Sitting, Cuff Size: Large)   Pulse 85   Temp 97.8 F (36.6 C) (Temporal)   Ht 5\' 11"  (1.803 m)   SpO2 98%   BMI 36.54 kg/m  Wt Readings from Last 3 Encounters:  04/13/23 262 lb (118.8 kg)  03/09/23 253 lb (114.8 kg)  01/04/23 250 lb (113.4 kg)   Alert and oriented and in no distress. Pharyngeal area is normal. Neck is supple with normal ROM. Cardiac exam shows RRR. Normal respirations. RLE with mild edema. Scars and scabs on knee and anterior lower leg. Normal sensation, cap refil and pulses.   RLE- lateral leg with a Richard triangular wound, eschar and  exudate noted. Wound culture obtained    Assessment & Plan:   Problem List Items Addressed This Visit   None Visit Diagnoses       Post-traumatic wound infection    -  Primary   Relevant Medications   clindamycin (CLEOCIN) 300 MG capsule   Other Relevant Orders   Ambulatory referral to Wound Clinic   Wound culture     Multiple falls         Wound of right lower extremity, initial encounter       Relevant Medications   clindamycin (CLEOCIN) 300 MG capsule     Laceration of left hand without foreign body, initial encounter          Wound culture obtained. Wound cleaned and  dressed with antibiotic ointment and covered with non adhesive dressing, gauze for padding and paper tape.  Clindamycin prescribed (due to allergies).  Advised to take a probiotic with the medication.  Referral to wound care.  Advised to follow up here in 7 days if he cannot get in to the wound clinic by then.  Use antibiotic ointment and keep covered.  Recommend cutting back on alcohol since he has had 3 falls since September and he admits alcohol was involved. He reports that he has cut back already.    I have discontinued Chadi Midyette. Bentz's Oxycodone HCl, mupirocin ointment, and methylPREDNISolone. I am also having him start on clindamycin. Additionally, I am having him maintain his DULoxetine, tiZANidine, gabapentin, meloxicam, acetaminophen, albuterol, triamcinolone, folic acid, vitamin E, cyanocobalamin, Centrum Silver 50+Men, Vitamin D-3, ascorbic acid, bacitracin, and atorvastatin.  Meds ordered this encounter  Medications   clindamycin (CLEOCIN) 300 MG capsule    Sig: Take 1 capsule (300 mg total) by mouth 3 (three) times daily.    Dispense:  21 capsule    Refill:  0    Supervising Provider:   Hillard Danker A [[id number]]

## 2023-05-20 NOTE — Patient Instructions (Signed)
Take the antibiotic as prescribed with plenty of water.   Follow up here in 7 days if you cannot get into the wound clinic by then.

## 2023-05-23 ENCOUNTER — Encounter: Payer: Self-pay | Admitting: Family Medicine

## 2023-05-23 LAB — WOUND CULTURE

## 2023-05-24 NOTE — Telephone Encounter (Unsigned)
Copied from CRM [id number]. Topic: General - Other >> May 24, 2023 11:31 AM Gurney Maxin H wrote: Reason for CRM: Patient called in to verify if needs to keep appointment with provider for a 1 week follow up on 1/24, patient states he was able to get an appointment with wound care on 1/31 but not sure if he still needs to see Dr. Jonny Ruiz as well on the 24th. Please reach out to patient for clarity, thanks.  [place] [phone]

## 2023-05-25 NOTE — Telephone Encounter (Signed)
Called and spoke with Pt

## 2023-05-27 ENCOUNTER — Ambulatory Visit: Payer: Medicare Other | Admitting: Internal Medicine

## 2023-05-30 NOTE — Progress Notes (Deleted)
[name] [address] [place] [address] Phone: [phone] Subjective:    I'm seeing this patient by the request  of:  [name], MD  CC: Bilateral knee pain  HQI:[id number]  04/13/2023 Was called to follow with exacerbation.  Patient has had a difficult year at the moment with all his different injuries when reviewing patient's chart.  Patient seems to be now coping with it and is here to focus more on his knees and back pain again.  Attempted the knee injections today and hopeful that this will make some improvement.  Has responded to viscosupplementation again to see if he can have approval.  Follow-up with me again in 6 to 8 weeks    Update 05/31/2023 [name] is a 63 y.o. male coming in with complaint of B knee pain. Patient states   Since we have seen patient patient was seen for a wound check given clindamycin at the time.    Past Medical History:  Diagnosis Date   Allergy    Anxiety    Arthritis    Back   Chronic back pain    Colon polyps    COPD (chronic obstructive pulmonary disease) (HCC)    Depression    GERD (gastroesophageal reflux disease)    every now and then   Headache    Hyperlipidemia 09/26/2015   Lumbar surgical wound fluid collection    lumbar wound dehiscence   Pneumonia 2020   Past Surgical History:  Procedure Laterality Date   BACK SURGERY     COLONOSCOPY     COLONOSCOPY W/ BIOPSIES AND POLYPECTOMY     EYE SURGERY     IR US GUIDE BX ASP/DRAIN  11/18/2020   KNEE ARTHROSCOPY WITH MEDIAL MENISECTOMY Left 01/23/2016   Procedure: KNEE ARTHROSCOPY WITH MEDIAL MENISECTOMY;  Surgeon: Marcene Corning, MD;  Location: [facility];  Service: Orthopedics;  Laterality: Left;   LUMBAR LAMINECTOMY/DECOMPRESSION MICRODISCECTOMY N/A 06/09/2020   Procedure: Decompressive laminectomy Lumbarone -two;  Surgeon: Tia Alert, MD;  Location: [facility];  Service: Neurosurgery;  Laterality: N/A;   LUMBAR  WOUND DEBRIDEMENT N/A 03/21/2014   Procedure: LUMBAR WOUND DEBRIDEMENT;  Surgeon: Tia Alert, MD;  Location: [facility];  Service: Neurosurgery;  Laterality: N/A;   MAXIMUM ACCESS (MAS)POSTERIOR LUMBAR INTERBODY FUSION (PLIF) 1 LEVEL N/A 02/14/2014   Procedure: FOR MAXIMUM ACCESS SURGERY POSTERIOR LUMBAR INTERBODY FUSION LUMBAR TWO-THREE;  Surgeon: Tia Alert, MD;  Location: [facility];  Service: Neurosurgery;  Laterality: N/A;   NECK SURGERY     SHOULDER SURGERY     SPINE SURGERY     TONSILLECTOMY     Social History   Socioeconomic History   Marital status: Married    Spouse name: cindy   Number of children: Not on file   Years of education: Not on file   Highest education level: Not on file  Occupational History   Occupation: produce manager/ disability    Employer: [employer]  Tobacco Use   Smoking status: Every Day    Current packs/day: 0.50    Average packs/day: 0.5 packs/day for 44.0 years (22.0 ttl pk-yrs)    Types: Cigarettes   Smokeless tobacco: Former    Types: Snuff  Vaping Use   Vaping status: Never Used  Substance and Sexual Activity   Alcohol use: Yes    Alcohol/week: 8.0 standard drinks of alcohol    Types: 8 Standard drinks or equivalent per week  Comment: social beer   Drug use: No   Sexual activity: Yes    Partners: Female  Other Topics Concern   Not on file  Social History Narrative   Exercise--no   Social Drivers of Health   Financial Resource Strain: Low Risk  (07/08/2022)   Overall Financial Resource Strain (CARDIA)    Difficulty of Paying Living Expenses: Not hard at all  Food Insecurity: No Food Insecurity (07/13/2022)   Hunger Vital Sign    Worried About Running Out of Food in the Last Year: Never true    Ran Out of Food in the Last Year: Never true  Transportation Needs: No Transportation Needs (07/13/2022)   PRAPARE - Administrator, Civil Service (Medical): No    Lack of Transportation (Non-Medical): No  Physical  Activity: Sufficiently Active (07/08/2022)   Exercise Vital Sign    Days of Exercise per Week: 5 days    Minutes of Exercise per Session: 30 min  Stress: No Stress Concern Present (07/08/2022)   [employer] of Occupational Health - Occupational Stress Questionnaire    Feeling of Stress : Not at all  Social Connections: Unknown (07/08/2022)   Social Connection and Isolation Panel [NHANES]    Frequency of Communication with Friends and Family: More than three times a week    Frequency of Social Gatherings with Friends and Family: More than three times a week    Attends Religious Services: Patient declined    Database administrator or Organizations: Patient declined    Attends Engineer, structural: Patient declined    Marital Status: Married   Allergies  Allergen Reactions   Cefuroxime Axetil Hives   Family History  Problem Relation Age of Onset   Colon cancer Father 16       pt thinks father had colon cancer   Stomach cancer Father    Hypertension Mother    Colon polyps Brother    Aneurysm Maternal Aunt 58       brain   Heart disease Maternal Aunt        d 16   Stomach cancer Paternal Uncle 57   Aneurysm Maternal Grandfather        stomach   Colon cancer Maternal Grandfather        thinks grandfather had colon cancer   Stomach cancer Paternal Grandfather 25   Depression Brother 20   Suicidality Brother    Stomach cancer Paternal Uncle 38   Esophageal cancer Neg Hx    Rectal cancer Neg Hx    Diabetes Neg Hx      Current Outpatient Medications (Cardiovascular):    atorvastatin (LIPITOR) 10 MG tablet, TAKE 1 TABLET BY MOUTH EVERY DAY  Current Outpatient Medications (Respiratory):    albuterol (VENTOLIN HFA) 108 (90 Base) MCG/ACT inhaler, Inhale 1-2 puffs into the lungs every 6 (six) hours as needed for wheezing or shortness of breath.   triamcinolone (NASACORT) 55 MCG/ACT AERO nasal inhaler, Place 2 sprays into the nose daily.  Current Outpatient Medications  (Analgesics):    acetaminophen (TYLENOL) 325 MG tablet, Take 650 mg by mouth every 6 (six) hours as needed for mild pain, fever or headache.   meloxicam (MOBIC) 15 MG tablet, Take 1 tablet (15 mg total) by mouth daily.  Current Outpatient Medications (Hematological):    cyanocobalamin (VITAMIN B12) 1000 MCG tablet, Take 1,000 mcg by mouth daily.   folic acid (FOLVITE) 800 MCG tablet, Take 400 mcg by mouth daily.  Current Outpatient  Medications (Other):    ascorbic acid (VITAMIN C) 500 MG tablet, Take 500 mg by mouth daily.   bacitracin ointment, Apply 1 Application topically 2 (two) times daily.   Cholecalciferol (VITAMIN D-3) 25 MCG (1000 UT) CAPS, Take by mouth daily.   clindamycin (CLEOCIN) 300 MG capsule, Take 1 capsule (300 mg total) by mouth 3 (three) times daily.   DULoxetine (CYMBALTA) 60 MG capsule, Take 60 mg by mouth at bedtime.   gabapentin (NEURONTIN) 300 MG capsule, Take 1 capsule (300 mg total) by mouth 3 (three) times daily.   Multiple Vitamins-Minerals (CENTRUM SILVER 50+MEN) TABS, Take by mouth.   tiZANidine (ZANAFLEX) 4 MG tablet, Take 4 mg by mouth 3 (three) times daily.    vitamin E 180 MG (400 UNITS) capsule, Take 400 Units by mouth daily.   Reviewed prior external information including notes and imaging from  primary care provider As well as notes that were available from care everywhere and other healthcare systems.  Past medical history, social, surgical and family history all reviewed in electronic medical record.  No pertanent information unless stated regarding to the chief complaint.   Review of Systems:  No headache, visual changes, nausea, vomiting, diarrhea, constipation, dizziness, abdominal pain, skin rash, fevers, chills, night sweats, weight loss, swollen lymph nodes, body aches, joint swelling, chest pain, shortness of breath, mood changes. POSITIVE muscle aches  Objective  There were no vitals taken for this visit.   General: No apparent distress  alert and oriented x3 mood and affect normal, dressed appropriately.  HEENT: Pupils equal, extraocular movements intact  Respiratory: Patient's speak in full sentences and does not appear short of breath  Cardiovascular: No lower extremity edema, non tender, no erythema  Bilateral knee exam shows    Impression and Recommendations:    The above documentation has been reviewed and is accurate and complete Judi Saa, DO

## 2023-05-31 ENCOUNTER — Ambulatory Visit: Payer: Medicare Other | Admitting: Family Medicine

## 2023-06-02 ENCOUNTER — Encounter (HOSPITAL_BASED_OUTPATIENT_CLINIC_OR_DEPARTMENT_OTHER): Payer: Medicare Other | Attending: Internal Medicine | Admitting: Internal Medicine

## 2023-06-02 DIAGNOSIS — F17210 Nicotine dependence, cigarettes, uncomplicated: Secondary | ICD-10-CM | POA: Diagnosis not present

## 2023-06-02 DIAGNOSIS — I87313 Chronic venous hypertension (idiopathic) with ulcer of bilateral lower extremity: Secondary | ICD-10-CM | POA: Diagnosis not present

## 2023-06-02 DIAGNOSIS — L97822 Non-pressure chronic ulcer of other part of left lower leg with fat layer exposed: Secondary | ICD-10-CM | POA: Insufficient documentation

## 2023-06-02 DIAGNOSIS — I87312 Chronic venous hypertension (idiopathic) with ulcer of left lower extremity: Secondary | ICD-10-CM | POA: Diagnosis not present

## 2023-06-02 DIAGNOSIS — T798XXA Other early complications of trauma, initial encounter: Secondary | ICD-10-CM | POA: Diagnosis not present

## 2023-06-02 DIAGNOSIS — L97812 Non-pressure chronic ulcer of other part of right lower leg with fat layer exposed: Secondary | ICD-10-CM | POA: Diagnosis not present

## 2023-06-02 DIAGNOSIS — J4489 Other specified chronic obstructive pulmonary disease: Secondary | ICD-10-CM | POA: Insufficient documentation

## 2023-06-02 DIAGNOSIS — I87311 Chronic venous hypertension (idiopathic) with ulcer of right lower extremity: Secondary | ICD-10-CM | POA: Diagnosis not present

## 2023-06-06 DIAGNOSIS — R1084 Generalized abdominal pain: Secondary | ICD-10-CM | POA: Diagnosis not present

## 2023-06-06 DIAGNOSIS — N200 Calculus of kidney: Secondary | ICD-10-CM | POA: Diagnosis not present

## 2023-06-10 ENCOUNTER — Encounter (HOSPITAL_BASED_OUTPATIENT_CLINIC_OR_DEPARTMENT_OTHER): Payer: Medicare Other | Attending: Internal Medicine | Admitting: Internal Medicine

## 2023-06-10 DIAGNOSIS — I87312 Chronic venous hypertension (idiopathic) with ulcer of left lower extremity: Secondary | ICD-10-CM | POA: Insufficient documentation

## 2023-06-10 DIAGNOSIS — L97822 Non-pressure chronic ulcer of other part of left lower leg with fat layer exposed: Secondary | ICD-10-CM | POA: Diagnosis not present

## 2023-06-10 DIAGNOSIS — J4489 Other specified chronic obstructive pulmonary disease: Secondary | ICD-10-CM | POA: Diagnosis not present

## 2023-06-10 DIAGNOSIS — S61402A Unspecified open wound of left hand, initial encounter: Secondary | ICD-10-CM | POA: Diagnosis not present

## 2023-06-10 DIAGNOSIS — T798XXA Other early complications of trauma, initial encounter: Secondary | ICD-10-CM | POA: Insufficient documentation

## 2023-06-10 DIAGNOSIS — X58XXXA Exposure to other specified factors, initial encounter: Secondary | ICD-10-CM | POA: Diagnosis not present

## 2023-06-10 DIAGNOSIS — L97812 Non-pressure chronic ulcer of other part of right lower leg with fat layer exposed: Secondary | ICD-10-CM | POA: Diagnosis not present

## 2023-06-10 DIAGNOSIS — I87311 Chronic venous hypertension (idiopathic) with ulcer of right lower extremity: Secondary | ICD-10-CM | POA: Insufficient documentation

## 2023-06-17 ENCOUNTER — Encounter (HOSPITAL_BASED_OUTPATIENT_CLINIC_OR_DEPARTMENT_OTHER): Payer: Medicare Other | Admitting: Internal Medicine

## 2023-06-17 DIAGNOSIS — S61402A Unspecified open wound of left hand, initial encounter: Secondary | ICD-10-CM | POA: Diagnosis not present

## 2023-06-17 DIAGNOSIS — L97812 Non-pressure chronic ulcer of other part of right lower leg with fat layer exposed: Secondary | ICD-10-CM | POA: Diagnosis not present

## 2023-06-17 DIAGNOSIS — I87311 Chronic venous hypertension (idiopathic) with ulcer of right lower extremity: Secondary | ICD-10-CM | POA: Diagnosis not present

## 2023-06-17 DIAGNOSIS — L97822 Non-pressure chronic ulcer of other part of left lower leg with fat layer exposed: Secondary | ICD-10-CM | POA: Diagnosis not present

## 2023-06-17 DIAGNOSIS — J4489 Other specified chronic obstructive pulmonary disease: Secondary | ICD-10-CM | POA: Diagnosis not present

## 2023-06-17 DIAGNOSIS — I87312 Chronic venous hypertension (idiopathic) with ulcer of left lower extremity: Secondary | ICD-10-CM | POA: Diagnosis not present

## 2023-06-17 DIAGNOSIS — T798XXA Other early complications of trauma, initial encounter: Secondary | ICD-10-CM | POA: Diagnosis not present

## 2023-06-23 ENCOUNTER — Telehealth: Payer: Self-pay | Admitting: *Deleted

## 2023-06-23 ENCOUNTER — Other Ambulatory Visit: Payer: Self-pay | Admitting: *Deleted

## 2023-06-23 DIAGNOSIS — Z122 Encounter for screening for malignant neoplasm of respiratory organs: Secondary | ICD-10-CM

## 2023-06-23 DIAGNOSIS — F17210 Nicotine dependence, cigarettes, uncomplicated: Secondary | ICD-10-CM

## 2023-06-23 DIAGNOSIS — Z87891 Personal history of nicotine dependence: Secondary | ICD-10-CM

## 2023-06-23 NOTE — Telephone Encounter (Signed)
Lung Cancer Screening Narrative/Criteria Questionnaire (Cigarette Smokers Only- No Cigars/Pipes/vapes)   [name]   SDMV:07/06/23 8:45- [name]                                           21-Aug-1960              LDCT: 07/07/23 DWB 8:30    62 y.o.   Phone: [phone]  Lung Screening Narrative (confirm age 74-77 yrs Medicare / 50-80 yrs Private pay insurance)   Insurance information:[employer] [facility]   Referring Provider:Dr Jonny Ruiz   This screening involves an initial phone call with a team member from our program. It is called a shared decision making visit. The initial meeting is required by insurance and Medicare to make sure you understand the program. This appointment takes about 15-20 minutes to complete. The CT scan will completed at a separate date/time. This scan takes about 5-10 minutes to complete and you may eat and drink before and after the scan.  Criteria questions for Lung Cancer Screening:   Are you a current or former smoker? Current Age began smoking: 16   If you are a former smoker, what year did you quit smoking? (within 15 yrs)   To calculate your smoking history, I need an accurate estimate of how many packs of cigarettes you smoked per day and for how many years. (Not just the number of PPD you are now smoking)   Years smoking 46 x Packs per day 1/2-3/4 = Pack years 25   (at least 20 pack yrs)   (Make sure they understand that we need to know how much they have smoked in the past, not just the number of PPD they are smoking now)  Do you have a personal history of cancer?  No    Do you have a family history of cancer? Yes  (cancer type and and relative) father (stomach)  Are you coughing up blood?  No  Have you had unexplained weight loss of 15 lbs or more in the last 6 months? No  It looks like you meet all criteria.     Additional information: N/A

## 2023-06-24 ENCOUNTER — Encounter (HOSPITAL_BASED_OUTPATIENT_CLINIC_OR_DEPARTMENT_OTHER): Payer: Medicare Other | Admitting: Internal Medicine

## 2023-06-24 DIAGNOSIS — S61402A Unspecified open wound of left hand, initial encounter: Secondary | ICD-10-CM | POA: Diagnosis not present

## 2023-06-24 DIAGNOSIS — L97822 Non-pressure chronic ulcer of other part of left lower leg with fat layer exposed: Secondary | ICD-10-CM | POA: Diagnosis not present

## 2023-06-24 DIAGNOSIS — L97812 Non-pressure chronic ulcer of other part of right lower leg with fat layer exposed: Secondary | ICD-10-CM

## 2023-06-24 DIAGNOSIS — I87311 Chronic venous hypertension (idiopathic) with ulcer of right lower extremity: Secondary | ICD-10-CM

## 2023-06-24 DIAGNOSIS — I87312 Chronic venous hypertension (idiopathic) with ulcer of left lower extremity: Secondary | ICD-10-CM | POA: Diagnosis not present

## 2023-06-24 DIAGNOSIS — T798XXA Other early complications of trauma, initial encounter: Secondary | ICD-10-CM | POA: Diagnosis not present

## 2023-06-24 DIAGNOSIS — J4489 Other specified chronic obstructive pulmonary disease: Secondary | ICD-10-CM | POA: Diagnosis not present

## 2023-07-01 ENCOUNTER — Encounter (HOSPITAL_BASED_OUTPATIENT_CLINIC_OR_DEPARTMENT_OTHER): Payer: Medicare Other | Admitting: Internal Medicine

## 2023-07-01 DIAGNOSIS — I87311 Chronic venous hypertension (idiopathic) with ulcer of right lower extremity: Secondary | ICD-10-CM | POA: Diagnosis not present

## 2023-07-01 DIAGNOSIS — I87312 Chronic venous hypertension (idiopathic) with ulcer of left lower extremity: Secondary | ICD-10-CM | POA: Diagnosis not present

## 2023-07-01 DIAGNOSIS — L97812 Non-pressure chronic ulcer of other part of right lower leg with fat layer exposed: Secondary | ICD-10-CM

## 2023-07-01 DIAGNOSIS — J4489 Other specified chronic obstructive pulmonary disease: Secondary | ICD-10-CM | POA: Diagnosis not present

## 2023-07-01 DIAGNOSIS — L97822 Non-pressure chronic ulcer of other part of left lower leg with fat layer exposed: Secondary | ICD-10-CM | POA: Diagnosis not present

## 2023-07-01 DIAGNOSIS — T798XXA Other early complications of trauma, initial encounter: Secondary | ICD-10-CM | POA: Diagnosis not present

## 2023-07-01 DIAGNOSIS — S61402A Unspecified open wound of left hand, initial encounter: Secondary | ICD-10-CM | POA: Diagnosis not present

## 2023-07-06 ENCOUNTER — Encounter: Payer: Self-pay | Admitting: Adult Health

## 2023-07-06 ENCOUNTER — Ambulatory Visit: Payer: Medicare Other | Admitting: Adult Health

## 2023-07-06 DIAGNOSIS — F17210 Nicotine dependence, cigarettes, uncomplicated: Secondary | ICD-10-CM | POA: Diagnosis not present

## 2023-07-06 NOTE — Patient Instructions (Signed)

## 2023-07-06 NOTE — Progress Notes (Signed)
  Virtual Visit via Telephone Note  I connected with [name] , 07/06/23 8:45 AM by a telemedicine application and verified that I am speaking with the correct person using two identifiers.  Location: Patient: home Provider: home   I discussed the limitations of evaluation and management by telemedicine and the availability of in person appointments. The patient expressed understanding and agreed to proceed.   Shared Decision Making Visit Lung Cancer Screening Program [phone])   Eligibility: 63 y.o. Pack Years Smoking History Calculation = 25 pack years (# packs/per year x # years smoked) Recent History of coughing up blood  no Unexplained weight loss? no ( >Than 15 pounds within the last 6 months ) Prior History Lung / other cancer no (Diagnosis within the last 5 years already requiring surveillance chest CT Scans). Smoking Status Current Smoker  Visit Components: Discussion included one or more decision making aids. YES Discussion included risk/benefits of screening. YES Discussion included potential follow up diagnostic testing for abnormal scans. YES Discussion included meaning and risk of over diagnosis. YES Discussion included meaning and risk of False Positives. YES Discussion included meaning of total radiation exposure. YES  Counseling Included: Importance of adherence to annual lung cancer LDCT screening. YES Impact of comorbidities on ability to participate in the program. YES Ability and willingness to under diagnostic treatment. YES  Smoking Cessation Counseling: Current Smokers:  Discussed importance of smoking cessation. yes Information about tobacco cessation classes and interventions provided to patient. yes Patient provided with "ticket" for LDCT Scan. yes Symptomatic Patient. NO Diagnosis Code: Tobacco Use Z72.0 Asymptomatic Patient yes  Counseling (Intermediate counseling: > three minutes counseling) H0865 (CT Chest Lung Cancer Screening  Low Dose W/O CM) [id number]  Z12.2-Screening of respiratory organs Z87.891-Personal history of nicotine dependence   Danford Bad 07/06/23

## 2023-07-07 ENCOUNTER — Ambulatory Visit (HOSPITAL_BASED_OUTPATIENT_CLINIC_OR_DEPARTMENT_OTHER)
Admission: RE | Admit: 2023-07-07 | Discharge: 2023-07-07 | Disposition: A | Discharge status: Home / Self Care | Payer: Medicare Other | Source: Ambulatory Visit | Attending: Internal Medicine | Admitting: Internal Medicine

## 2023-07-07 DIAGNOSIS — Z87891 Personal history of nicotine dependence: Secondary | ICD-10-CM | POA: Diagnosis not present

## 2023-07-07 DIAGNOSIS — F17210 Nicotine dependence, cigarettes, uncomplicated: Secondary | ICD-10-CM | POA: Insufficient documentation

## 2023-07-07 DIAGNOSIS — Z122 Encounter for screening for malignant neoplasm of respiratory organs: Secondary | ICD-10-CM | POA: Diagnosis not present

## 2023-07-08 ENCOUNTER — Encounter (HOSPITAL_BASED_OUTPATIENT_CLINIC_OR_DEPARTMENT_OTHER): Payer: Medicare Other | Attending: Internal Medicine | Admitting: Internal Medicine

## 2023-07-08 DIAGNOSIS — L97812 Non-pressure chronic ulcer of other part of right lower leg with fat layer exposed: Secondary | ICD-10-CM | POA: Diagnosis not present

## 2023-07-08 DIAGNOSIS — I87311 Chronic venous hypertension (idiopathic) with ulcer of right lower extremity: Secondary | ICD-10-CM | POA: Diagnosis not present

## 2023-07-08 DIAGNOSIS — J449 Chronic obstructive pulmonary disease, unspecified: Secondary | ICD-10-CM | POA: Diagnosis not present

## 2023-07-08 DIAGNOSIS — T798XXA Other early complications of trauma, initial encounter: Secondary | ICD-10-CM | POA: Insufficient documentation

## 2023-07-11 ENCOUNTER — Ambulatory Visit (INDEPENDENT_AMBULATORY_CARE_PROVIDER_SITE_OTHER): Payer: Medicare Other

## 2023-07-11 VITALS — Ht 71.0 in | Wt 262.0 lb

## 2023-07-11 DIAGNOSIS — Z0000 Encounter for general adult medical examination without abnormal findings: Secondary | ICD-10-CM | POA: Diagnosis not present

## 2023-07-11 NOTE — Patient Instructions (Signed)
 [name] , Thank you for taking time to come for your Medicare Wellness Visit. I appreciate your ongoing commitment to your health goals. Please review the following plan we discussed and let me know if I can assist you in the future.   Referrals/Orders/Follow-Ups/Clinician Recommendations: It was nice talking with you today.  You are due for a Shingles vaccine and a Pneumonia vaccine.    This is a list of the screening recommended for you and due dates:  Health Maintenance  Topic Date Due   Zoster (Shingles) Vaccine (1 of 2) Never done   Screening for Lung Cancer  10/16/2019   Pneumococcal Vaccination (2 of 2 - PCV) 07/24/2021   COVID-19 Vaccine (6 - 2024-25 season) 01/02/2023   Medicare Annual Wellness Visit  07/10/2024   Colon Cancer Screening  06/04/2025   DTaP/Tdap/Td vaccine (4 - Td or Tdap) 01/22/2029   Flu Shot  Completed   Hepatitis C Screening  Completed   HIV Screening  Completed   HPV Vaccine  Aged Out    Advanced directives: (Copy Requested) Please bring a copy of your health care power of attorney and living will to the office to be added to your chart at your convenience. You can mail to [facility] [address] [place], [place] [address] or email to [email or web address]  Next Medicare Annual Wellness Visit scheduled for next year: Yes

## 2023-07-11 NOTE — Progress Notes (Signed)
 Subjective:   [name] is a 63 y.o. who presents for a Medicare Wellness preventive visit.  Visit Complete: Virtual I connected with  [name] on 07/11/23 by a audio enabled telemedicine application and verified that I am speaking with the correct person using two identifiers.  Patient Location: Home  Provider Location: Home Office  I discussed the limitations of evaluation and management by telemedicine. The patient expressed understanding and agreed to proceed.  Vital Signs: Because this visit was a virtual/telehealth visit, some criteria may be missing or patient reported. Any vitals not documented were not able to be obtained and vitals that have been documented are patient reported.  VideoDeclined- This patient declined Librarian, academic. Therefore the visit was completed with audio only.  AWV Questionnaire: No: Patient Medicare AWV questionnaire was not completed prior to this visit.  Cardiac Risk Factors include: advanced age (>27men, >53 women);male gender;Other (see comment), Risk factor comments: COPD     Objective:    Today's Vitals   07/11/23 1353  Weight: 262 lb (118.8 kg)  Height: 5\' 11"  (1.803 m)   Body mass index is 36.54 kg/m.     07/11/2023    1:57 PM 01/04/2023    7:06 PM 09/01/2022    4:38 PM 07/26/2022    9:07 PM 07/08/2022   10:21 AM 11/18/2020   11:33 AM 11/18/2020   11:00 AM  Advanced Directives  Does Patient Have a Medical Advance Directive? Yes No No Yes Yes  No  Type of Estate agent of [place];Living will   Living will Healthcare Power of [place];Living will    Does patient want to make changes to medical advance directive?    No - Patient declined     Copy of Healthcare Power of Attorney in Chart? No - copy requested    No - copy requested    Would patient like information on creating a medical advance directive?   No - Patient declined   No - Patient declined     Current  Medications (verified) Outpatient Encounter Medications as of 07/11/2023  Medication Sig   acetaminophen (TYLENOL) 325 MG tablet Take 650 mg by mouth every 6 (six) hours as needed for mild pain, fever or headache.   albuterol (VENTOLIN HFA) 108 (90 Base) MCG/ACT inhaler Inhale 1-2 puffs into the lungs every 6 (six) hours as needed for wheezing or shortness of breath.   ascorbic acid (VITAMIN C) 500 MG tablet Take 500 mg by mouth daily.   atorvastatin (LIPITOR) 10 MG tablet TAKE 1 TABLET BY MOUTH EVERY DAY   bacitracin ointment Apply 1 Application topically 2 (two) times daily.   Cholecalciferol (VITAMIN D-3) 25 MCG (1000 UT) CAPS Take by mouth daily.   clindamycin (CLEOCIN) 300 MG capsule Take 1 capsule (300 mg total) by mouth 3 (three) times daily.   cyanocobalamin (VITAMIN B12) 1000 MCG tablet Take 1,000 mcg by mouth daily.   DULoxetine (CYMBALTA) 60 MG capsule Take 60 mg by mouth at bedtime.   folic acid (FOLVITE) 800 MCG tablet Take 400 mcg by mouth daily.   gabapentin (NEURONTIN) 300 MG capsule Take 1 capsule (300 mg total) by mouth 3 (three) times daily.   meloxicam (MOBIC) 15 MG tablet Take 1 tablet (15 mg total) by mouth daily.   Multiple Vitamins-Minerals (CENTRUM SILVER 50+MEN) TABS Take by mouth.   tiZANidine (ZANAFLEX) 4 MG tablet Take 4 mg by mouth 3 (three) times daily.    triamcinolone (NASACORT)  55 MCG/ACT AERO nasal inhaler Place 2 sprays into the nose daily.   vitamin E 180 MG (400 UNITS) capsule Take 400 Units by mouth daily.   No facility-administered encounter medications on file as of 07/11/2023.    Allergies (verified) Cefuroxime axetil   History: Past Medical History:  Diagnosis Date   Allergy    Anxiety    Arthritis    Back   Chronic back pain    Colon polyps    COPD (chronic obstructive pulmonary disease) (HCC)    Depression    GERD (gastroesophageal reflux disease)    every now and then   Headache    Hyperlipidemia 09/26/2015   Lumbar surgical wound  fluid collection    lumbar wound dehiscence   Pneumonia 2020   Past Surgical History:  Procedure Laterality Date   BACK SURGERY     COLONOSCOPY     COLONOSCOPY W/ BIOPSIES AND POLYPECTOMY     EYE SURGERY     IR US GUIDE BX ASP/DRAIN  11/18/2020   KNEE ARTHROSCOPY WITH MEDIAL MENISECTOMY Left 01/23/2016   Procedure: KNEE ARTHROSCOPY WITH MEDIAL MENISECTOMY;  Surgeon: Marcene Corning, MD;  Location: [facility];  Service: Orthopedics;  Laterality: Left;   LUMBAR LAMINECTOMY/DECOMPRESSION MICRODISCECTOMY N/A 06/09/2020   Procedure: Decompressive laminectomy Lumbarone -two;  Surgeon: Tia Alert, MD;  Location: [facility] OR;  Service: Neurosurgery;  Laterality: N/A;   LUMBAR WOUND DEBRIDEMENT N/A 03/21/2014   Procedure: LUMBAR WOUND DEBRIDEMENT;  Surgeon: Tia Alert, MD;  Location: [facility];  Service: Neurosurgery;  Laterality: N/A;   MAXIMUM ACCESS (MAS)POSTERIOR LUMBAR INTERBODY FUSION (PLIF) 1 LEVEL N/A 02/14/2014   Procedure: FOR MAXIMUM ACCESS SURGERY POSTERIOR LUMBAR INTERBODY FUSION LUMBAR TWO-THREE;  Surgeon: Tia Alert, MD;  Location: [facility];  Service: Neurosurgery;  Laterality: N/A;   NECK SURGERY     SHOULDER SURGERY     SPINE SURGERY     TONSILLECTOMY     Family History  Problem Relation Age of Onset   Colon cancer Father 7       pt thinks father had colon cancer   Stomach cancer Father    Hypertension Mother    Colon polyps Brother    Aneurysm Maternal Aunt 1       brain   Heart disease Maternal Aunt        d 83   Stomach cancer Paternal Uncle 94   Aneurysm Maternal Grandfather        stomach   Colon cancer Maternal Grandfather        thinks grandfather had colon cancer   Stomach cancer Paternal Grandfather 71   Depression Brother 20   Suicidality Brother    Stomach cancer Paternal Uncle 74   Esophageal cancer Neg Hx    Rectal cancer Neg Hx    Diabetes Neg Hx    Social History   Socioeconomic History   Marital status: Married     Spouse name: cindy   Number of children: Not on file   Years of education: Not on file   Highest education level: Not on file  Occupational History   Occupation: produce manager/ disability    Employer: [employer]  Tobacco Use   Smoking status: Every Day    Current packs/day: 0.50    Average packs/day: 0.5 packs/day for 44.0 years (22.0 ttl pk-yrs)    Types: Cigarettes   Smokeless tobacco: Former    Types: Snuff  Vaping Use   Vaping  status: Never Used  Substance and Sexual Activity   Alcohol use: Yes    Alcohol/week: 8.0 standard drinks of alcohol    Types: 8 Standard drinks or equivalent per week    Comment: social beer   Drug use: No   Sexual activity: Yes    Partners: Female  Other Topics Concern   Not on file  Social History Narrative   Exercise--no      Lives at home with his wife, his mother and sister-in-law.   Social Drivers of Corporate investment banker Strain: Low Risk  (07/11/2023)   Overall Financial Resource Strain (CARDIA)    Difficulty of Paying Living Expenses: Not very hard  Food Insecurity: No Food Insecurity (07/11/2023)   Hunger Vital Sign    Worried About Running Out of Food in the Last Year: Never true    Ran Out of Food in the Last Year: Never true  Transportation Needs: No Transportation Needs (07/11/2023)   PRAPARE - Administrator, Civil Service (Medical): No    Lack of Transportation (Non-Medical): No  Physical Activity: Insufficiently Active (07/11/2023)   Exercise Vital Sign    Days of Exercise per Week: 3 days    Minutes of Exercise per Session: 20 min  Stress: No Stress Concern Present (07/11/2023)   [employer] of Occupational Health - Occupational Stress Questionnaire    Feeling of Stress : Not at all  Social Connections: Moderately Isolated (07/11/2023)   Social Connection and Isolation Panel [NHANES]    Frequency of Communication with Friends and Family: Once a week    Frequency of Social Gatherings with Friends  and Family: Twice a week    Attends Religious Services: Never    Diplomatic Services operational officer: No    Attends Engineer, structural: Never    Marital Status: Married    Tobacco Counseling Ready to quit: Not Answered Counseling given: Not Answered    Clinical Intake:  Pre-visit preparation completed: Yes  Pain : No/denies pain     BMI - recorded: 36.54 Nutritional Status: BMI > 30  Obese Nutritional Risks: None Diabetes: No  How often do you need to have someone help you when you read instructions, pamphlets, or other written materials from your doctor or pharmacy?: 1 - Never  Interpreter Needed?: No  Information entered by :: Keiffer Piper, RMA   Activities of Daily Living     07/11/2023    1:55 PM  In your present state of health, do you have any difficulty performing the following activities:  Hearing? 0  Vision? 0  Difficulty concentrating or making decisions? 0  Walking or climbing stairs? 0  Dressing or bathing? 0  Doing errands, shopping? 0  Preparing Food and eating ? N  Using the Toilet? N  In the past six months, have you accidently leaked urine? N  Do you have problems with loss of bowel control? N  Managing your Medications? N  Managing your Finances? N  Housekeeping or managing your Housekeeping? N    Patient Care Team: [name], MD as PCP - General (Internal Medicine) Arman Bogus, MD as Consulting Physician (Neurosurgery) Lavena Bullion, MD as Referring Physician (Neurosurgery) [facility] (Ophthalmology)  Indicate any recent Medical Services you may have received from other than [facility] providers in the past year (date may be approximate).     Assessment:   This is a routine wellness examination for Sie.  Hearing/Vision screen Hearing Screening - Comments:: Denies hearing difficulties   Vision Screening - Comments:: Wears eyeglasses   Goals Addressed                This Visit's Progress     My goal for 2024 is to get down to 200 pounds. (pt-stated)        STILL WORKING ON THAT GOAL.        Depression Screen     07/11/2023    2:02 PM 05/20/2023    8:13 AM 03/09/2023    8:14 AM 09/13/2022    8:53 AM 07/13/2022   11:24 AM 07/08/2022   10:25 AM 03/17/2022    2:02 PM  PHQ 2/9 Scores  PHQ - 2 Score 0 0 0 0 0 3 3  PHQ- 9 Score 0  0   4 9    Fall Risk     07/11/2023    1:57 PM 05/20/2023    8:12 AM 03/09/2023    8:14 AM 09/13/2022    8:52 AM 07/08/2022   10:22 AM  Fall Risk   Falls in the past year? 1 1 0 1 1  Number falls in past yr: 1 1 0 0 1  Injury with Fall? 1 1 0 1 1  Risk for fall due to :  Other (Comment) No Fall Risks No Fall Risks History of fall(s);Impaired balance/gait;Orthopedic patient  Risk for fall due to: Comment  alcohol     Follow up Falls evaluation completed;Falls prevention discussed Falls prevention discussed Falls evaluation completed Falls evaluation completed Education provided;Falls prevention discussed  Comment  NP discussed need to cut back on alcohol       MEDICARE RISK AT HOME:  Medicare Risk at Home Any stairs in or around the home?: Yes If so, are there any without handrails?: Yes Home free of loose throw rugs in walkways, pet beds, electrical cords, etc?: Yes Adequate lighting in your home to reduce risk of falls?: Yes Life alert?: No Use of a cane, walker or w/c?: No Grab bars in the bathroom?: No Shower chair or bench in shower?: Yes Elevated toilet seat or a handicapped toilet?: Yes  TIMED UP AND GO:  Was the test performed?  No  Cognitive Function: 6CIT completed        07/11/2023    1:59 PM 07/08/2022   10:28 AM  6CIT Screen  What Year? 0 points 0 points  What month? 0 points 0 points  What time? 0 points 0 points  Count back from 20 0 points 0 points  Months in reverse 2 points 0 points  Repeat phrase 0 points 0 points  Total Score 2 points 0 points    Immunizations Immunization History   Administered Date(s) Administered   Fluad Quad(high Dose 65+) 03/19/2021   Influenza Split 03/07/2012   Influenza, Seasonal, Injecte, Preservative Fre 03/09/2023   Influenza,inj,Quad PF,6+ Mos 01/13/2017, 01/23/2019, 02/21/2020, 01/26/2021   Influenza-Unspecified 05/03/2014, 01/31/2018, 03/08/2022   Moderna Sars-Covid-2 Vaccination 07/20/2019, 08/20/2019, 04/12/2020, 03/08/2022   Pfizer Covid-19 Vaccine Bivalent Booster 14yrs & up 01/26/2021   Pneumococcal Polysaccharide-23 05/03/2008, 07/24/2020   Tdap 05/03/2008, 05/23/2018, 01/23/2019    Screening Tests Health Maintenance  Topic Date Due   Zoster Vaccines- Shingrix (1 of 2) Never done   Lung Cancer Screening  10/16/2019   Pneumococcal Vaccine 70-32 Years old (2 of 2 - PCV) 07/24/2021   COVID-19 Vaccine (6 - 2024-25 season) 01/02/2023   Medicare Annual Wellness (AWV)  07/10/2024  Colonoscopy  06/04/2025   DTaP/Tdap/Td (4 - Td or Tdap) 01/22/2029   INFLUENZA VACCINE  Completed   Hepatitis C Screening  Completed   HIV Screening  Completed   HPV VACCINES  Aged Out    Health Maintenance  Health Maintenance Due  Topic Date Due   Zoster Vaccines- Shingrix (1 of 2) Never done   Lung Cancer Screening  10/16/2019   Pneumococcal Vaccine 67-40 Years old (2 of 2 - PCV) 07/24/2021   COVID-19 Vaccine (6 - 2024-25 season) 01/02/2023   Health Maintenance Items Addressed: See Nurse Notes  Additional Screening:  Vision Screening: Recommended annual ophthalmology exams for early detection of glaucoma and other disorders of the eye.  Dental Screening: Recommended annual dental exams for proper oral hygiene  Community Resource Referral / Chronic Care Management: CRR required this visit?  No   CCM required this visit?  No     Plan:     I have personally reviewed and noted the following in the patient's chart:   Medical and social history Use of alcohol, tobacco or illicit drugs  Current medications and supplements including  opioid prescriptions. Patient is not currently taking opioid prescriptions. Functional ability and status Nutritional status Physical activity Advanced directives List of other physicians Hospitalizations, surgeries, and ER visits in previous 12 months Vitals Screenings to include cognitive, depression, and falls Referrals and appointments  In addition, I have reviewed and discussed with patient certain preventive protocols, quality metrics, and best practice recommendations. A written personalized care plan for preventive services as well as general preventive health recommendations were provided to patient.     Keryn Nessler L Adyn Hoes, CMA   07/11/2023   After Visit Summary: (MyChart) Due to this being a telephonic visit, the after visit summary with patients personalized plan was offered to patient via MyChart   Notes: Please refer to Routing Comments.

## 2023-07-12 ENCOUNTER — Telehealth: Payer: Self-pay

## 2023-07-12 NOTE — Telephone Encounter (Signed)
 Hello - the CT scan was done mar 6 and results are pending.  Please be aware that the ordering provider is the one to give the results - Jefm Bryant NP

## 2023-07-12 NOTE — Telephone Encounter (Unsigned)
 Copied from CRM [id number]. Topic: General - Other >> Jul 11, 2023  5:20 PM Eunice Blase wrote: Reason for CRM: Pt called for imaging results for CT CHEST LUNG CA SCREEN LOW DOSE W/O CM (Order [id number]). Please call pt at [phone].

## 2023-07-13 NOTE — Telephone Encounter (Signed)
 Called and let Pt know

## 2023-07-15 ENCOUNTER — Encounter (HOSPITAL_BASED_OUTPATIENT_CLINIC_OR_DEPARTMENT_OTHER): Payer: Medicare Other | Admitting: Internal Medicine

## 2023-07-15 DIAGNOSIS — L97812 Non-pressure chronic ulcer of other part of right lower leg with fat layer exposed: Secondary | ICD-10-CM | POA: Diagnosis not present

## 2023-07-15 DIAGNOSIS — T798XXA Other early complications of trauma, initial encounter: Secondary | ICD-10-CM | POA: Diagnosis not present

## 2023-07-15 DIAGNOSIS — J4489 Other specified chronic obstructive pulmonary disease: Secondary | ICD-10-CM | POA: Diagnosis not present

## 2023-07-15 DIAGNOSIS — J449 Chronic obstructive pulmonary disease, unspecified: Secondary | ICD-10-CM | POA: Diagnosis not present

## 2023-07-15 DIAGNOSIS — I87311 Chronic venous hypertension (idiopathic) with ulcer of right lower extremity: Secondary | ICD-10-CM | POA: Diagnosis not present

## 2023-07-22 ENCOUNTER — Encounter (HOSPITAL_BASED_OUTPATIENT_CLINIC_OR_DEPARTMENT_OTHER): Admitting: Internal Medicine

## 2023-07-22 DIAGNOSIS — I87311 Chronic venous hypertension (idiopathic) with ulcer of right lower extremity: Secondary | ICD-10-CM

## 2023-07-22 DIAGNOSIS — L97812 Non-pressure chronic ulcer of other part of right lower leg with fat layer exposed: Secondary | ICD-10-CM

## 2023-07-22 DIAGNOSIS — J449 Chronic obstructive pulmonary disease, unspecified: Secondary | ICD-10-CM | POA: Diagnosis not present

## 2023-07-22 DIAGNOSIS — J4489 Other specified chronic obstructive pulmonary disease: Secondary | ICD-10-CM | POA: Diagnosis not present

## 2023-07-22 DIAGNOSIS — T798XXA Other early complications of trauma, initial encounter: Secondary | ICD-10-CM

## 2023-08-01 ENCOUNTER — Other Ambulatory Visit: Payer: Self-pay | Admitting: Acute Care

## 2023-08-01 DIAGNOSIS — Z87891 Personal history of nicotine dependence: Secondary | ICD-10-CM

## 2023-08-01 DIAGNOSIS — F17210 Nicotine dependence, cigarettes, uncomplicated: Secondary | ICD-10-CM

## 2023-08-01 DIAGNOSIS — Z122 Encounter for screening for malignant neoplasm of respiratory organs: Secondary | ICD-10-CM

## 2023-08-04 ENCOUNTER — Ambulatory Visit (INDEPENDENT_AMBULATORY_CARE_PROVIDER_SITE_OTHER): Admitting: Internal Medicine

## 2023-08-04 ENCOUNTER — Encounter: Payer: Self-pay | Admitting: Internal Medicine

## 2023-08-04 VITALS — BP 148/86 | HR 80 | Temp 98.8°F | Ht 71.0 in | Wt 270.0 lb

## 2023-08-04 DIAGNOSIS — E66812 Obesity, class 2: Secondary | ICD-10-CM | POA: Diagnosis not present

## 2023-08-04 DIAGNOSIS — F17200 Nicotine dependence, unspecified, uncomplicated: Secondary | ICD-10-CM

## 2023-08-04 DIAGNOSIS — R739 Hyperglycemia, unspecified: Secondary | ICD-10-CM

## 2023-08-04 DIAGNOSIS — R062 Wheezing: Secondary | ICD-10-CM | POA: Diagnosis not present

## 2023-08-04 DIAGNOSIS — R051 Acute cough: Secondary | ICD-10-CM | POA: Diagnosis not present

## 2023-08-04 DIAGNOSIS — Z6838 Body mass index (BMI) 38.0-38.9, adult: Secondary | ICD-10-CM

## 2023-08-04 MED ORDER — PHENTERMINE HCL 30 MG PO CAPS: 30.0000 mg | ORAL_CAPSULE | ORAL | 5 refills | Status: DC

## 2023-08-04 MED ORDER — AZITHROMYCIN 250 MG PO TABS: ORAL_TABLET | ORAL | 1 refills | Status: AC

## 2023-08-04 MED ORDER — ALBUTEROL SULFATE HFA 108 (90 BASE) MCG/ACT IN AERS: 1.0000 | INHALATION_SPRAY | Freq: Four times a day (QID) | RESPIRATORY_TRACT | 11 refills | Status: DC | PRN

## 2023-08-04 MED ORDER — HYDROCODONE BIT-HOMATROP MBR 5-1.5 MG/5ML PO SOLN: 5.0000 mL | Freq: Four times a day (QID) | ORAL | 0 refills | Status: AC | PRN

## 2023-08-04 MED ORDER — PREDNISONE 10 MG PO TABS: ORAL_TABLET | ORAL | 0 refills | Status: DC

## 2023-08-04 NOTE — Patient Instructions (Signed)
 Please take all new medication as prescribed- the antibiotic, cough medicine, prednisone, and inhaler as needed  Please take all new medication as prescribed - the phentermine 30 mg per day  Please continue all other medications as before, and refills have been done if requested.  Please have the pharmacy call with any other refills you may need.  Please keep your appointments with your specialists as you may have planned

## 2023-08-04 NOTE — Progress Notes (Signed)
 Patient ID: [name], male   DOB: 1961/01/12, 63 y.o.   MRN: [id number]        Chief Complaint: follow up cough, wheezing, htn, obesity       HPI:  [name] is a 63 y.o. male Here with acute onset mild to mod 2-3 days ST, HA, general weakness and malaise, with prod cough greenish sputum, but Pt denies chest pain, orthopnea, PND, increased LE swelling, palpitations, dizziness or syncope, but has had mild sob wheezing  as well.   Pt denies polydipsia, polyuria, or new focal neuro s/s.   Hard to lose wt, makes breathing worse.  Good compliance with BP meds        Wt Readings from Last 3 Encounters:  08/04/23 270 lb (122.5 kg)  07/11/23 262 lb (118.8 kg)  04/13/23 262 lb (118.8 kg)   BP Readings from Last 3 Encounters:  08/04/23 (!) 148/86  05/20/23 128/80  04/13/23 128/82         Past Medical History:  Diagnosis Date   Allergy    Anxiety    Arthritis    Back   Chronic back pain    Colon polyps    COPD (chronic obstructive pulmonary disease) (HCC)    Depression    GERD (gastroesophageal reflux disease)    every now and then   Headache    Hyperlipidemia 09/26/2015   Lumbar surgical wound fluid collection    lumbar wound dehiscence   Pneumonia 2020   Past Surgical History:  Procedure Laterality Date   BACK SURGERY     COLONOSCOPY     COLONOSCOPY W/ BIOPSIES AND POLYPECTOMY     EYE SURGERY     IR US GUIDE BX ASP/DRAIN  11/18/2020   KNEE ARTHROSCOPY WITH MEDIAL MENISECTOMY Left 01/23/2016   Procedure: KNEE ARTHROSCOPY WITH MEDIAL MENISECTOMY;  Surgeon: Marcene Corning, MD;  Location: [facility];  Service: Orthopedics;  Laterality: Left;   LUMBAR LAMINECTOMY/DECOMPRESSION MICRODISCECTOMY N/A 06/09/2020   Procedure: Decompressive laminectomy Lumbarone -two;  Surgeon: Tia Alert, MD;  [facility];  Service: Neurosurgery;  Laterality: N/A;   LUMBAR WOUND DEBRIDEMENT N/A 03/21/2014   Procedure: LUMBAR WOUND DEBRIDEMENT;  Surgeon: Tia Alert,  MD;  Location: [facility];  Service: Neurosurgery;  Laterality: N/A;   MAXIMUM ACCESS (MAS)POSTERIOR LUMBAR INTERBODY FUSION (PLIF) 1 LEVEL N/A 02/14/2014   Procedure: FOR MAXIMUM ACCESS SURGERY POSTERIOR LUMBAR INTERBODY FUSION LUMBAR TWO-THREE;  Surgeon: Tia Alert, MD;  Location: [facility];  Service: Neurosurgery;  Laterality: N/A;   NECK SURGERY     SHOULDER SURGERY     SPINE SURGERY     TONSILLECTOMY      reports that he has been smoking cigarettes. He has a 22 pack-year smoking history. He has quit using smokeless tobacco.  His smokeless tobacco use included snuff. He reports current alcohol use of about 8.0 standard drinks of alcohol per week. He reports that he does not use drugs. family history includes Aneurysm in his maternal grandfather; Aneurysm (age of onset: 61) in his maternal aunt; Colon cancer in his maternal grandfather; Colon cancer (age of onset: 26) in his father; Colon polyps in his brother; Depression (age of onset: 3) in his brother; Heart disease in his maternal aunt; Hypertension in his mother; Stomach cancer in his father; Stomach cancer (age of onset: 52) in his paternal uncle; Stomach cancer (age of onset: 47) in his paternal uncle; Stomach cancer (age of onset: 39) in  his paternal grandfather; Suicidality in his brother. Allergies  Allergen Reactions   Cefuroxime Axetil Hives   Current Outpatient Medications on File Prior to Visit  Medication Sig Dispense Refill   acetaminophen (TYLENOL) 325 MG tablet Take 650 mg by mouth every 6 (six) hours as needed for mild pain, fever or headache.     ascorbic acid (VITAMIN C) 500 MG tablet Take 500 mg by mouth daily.     atorvastatin (LIPITOR) 10 MG tablet TAKE 1 TABLET BY MOUTH EVERY DAY 90 tablet 3   bacitracin ointment Apply 1 Application topically 2 (two) times daily. 120 g 0   Cholecalciferol (VITAMIN D-3) 25 MCG (1000 UT) CAPS Take by mouth daily.     clindamycin (CLEOCIN) 300 MG capsule Take 1 capsule (300 mg  total) by mouth 3 (three) times daily. 21 capsule 0   cyanocobalamin (VITAMIN B12) 1000 MCG tablet Take 1,000 mcg by mouth daily.     DULoxetine (CYMBALTA) 60 MG capsule Take 60 mg by mouth at bedtime.  2   folic acid (FOLVITE) 800 MCG tablet Take 400 mcg by mouth daily.     gabapentin (NEURONTIN) 300 MG capsule Take 1 capsule (300 mg total) by mouth 3 (three) times daily. 90 capsule 3   meloxicam (MOBIC) 15 MG tablet Take 1 tablet (15 mg total) by mouth daily. 90 tablet 3   Multiple Vitamins-Minerals (CENTRUM SILVER 50+MEN) TABS Take by mouth.     tiZANidine (ZANAFLEX) 4 MG tablet Take 4 mg by mouth 3 (three) times daily.   2   triamcinolone (NASACORT) 55 MCG/ACT AERO nasal inhaler Place 2 sprays into the nose daily. 1 each 12   vitamin E 180 MG (400 UNITS) capsule Take 400 Units by mouth daily.     No current facility-administered medications on file prior to visit.        ROS:  All others reviewed and negative.  Objective        PE:  BP (!) 148/86 (BP Location: Left Arm, Patient Position: Sitting, Cuff Size: Normal)   Pulse 80   Temp 98.8 F (37.1 C) (Oral)   Ht 5\' 11"  (1.803 m)   Wt 270 lb (122.5 kg)   SpO2 96%   BMI 37.66 kg/m                 Constitutional: Pt appears mild ill               HENT: Head: NCAT.                Right Ear: External ear normal.                 Left Ear: External ear normal. Bilat tm's with mild erythema.  Max sinus areas mild tender.  Pharynx with mild erythema, no exudate                 Eyes: . Pupils are equal, round, and reactive to light. Conjunctivae and EOM are normal               Nose: without d/c or deformity               Neck: Neck supple. Gross normal ROM               Cardiovascular: Normal rate and regular rhythm.                 Pulmonary/Chest: Effort normal and breath sounds without rales but with mild  bilateral wheezing.                Abd:  Soft, NT, ND, + BS, no organomegaly               Neurological: Pt is alert. At baseline  orientation, motor grossly intact               Skin: Skin is warm. No rashes, no other new lesions, LE edema - none               Psychiatric: Pt behavior is normal without agitation   Micro: none  Cardiac tracings I have personally interpreted today:  none  Pertinent Radiological findings (summarize): none   Lab Results  Component Value Date   WBC 11.6 (H) 03/09/2023   HGB 15.2 03/09/2023   HCT 46.2 03/09/2023   PLT 370.0 03/09/2023   GLUCOSE 97 03/09/2023   CHOL 148 03/09/2023   TRIG 154.0 (H) 03/09/2023   HDL 55.50 03/09/2023   LDLDIRECT 150.1 03/07/2012   LDLCALC 61 03/09/2023   ALT 26 03/09/2023   AST 16 03/09/2023   NA 140 03/09/2023   K 4.4 03/09/2023   CL 104 03/09/2023   CREATININE 0.86 03/09/2023   BUN 15 03/09/2023   CO2 27 03/09/2023   TSH 2.07 03/09/2023   PSA 0.71 03/09/2023   INR 0.9 07/13/2022   HGBA1C 5.8 03/09/2023   MICROALBUR 2.7 (H) 03/09/2023   Assessment/Plan:  Alphonza Tramell Vanatta is a 63 y.o. White or Caucasian [1] male with  has a past medical history of Allergy, Anxiety, Arthritis, Chronic back pain, Colon polyps, COPD (chronic obstructive pulmonary disease) (HCC), Depression, GERD (gastroesophageal reflux disease), Headache, Hyperlipidemia (09/26/2015), Lumbar surgical wound fluid collection, and Pneumonia (2020).  Smoker Pt counsled to quit, pt not ready  Hyperglycemia Lab Results  Component Value Date   HGBA1C 5.8 03/09/2023   Stable, pt to continue current medical treatment - diet, wt control   Cough Mild to mod, for antibx course zpack, cough med prn, declines cxr,,  to f/u any worsening symptoms or concerns  Wheezing Mild to mod, for prednisone taper,albuterol hfa prn,  to f/u any worsening symptoms or concerns  Obesity For phentermine 30 mg every day, f/u next visit  Followup: Return in about 6 months (around 02/03/2024), or if symptoms worsen or fail to improve.  Oliver Barre, MD 08/06/2023 6:50 PM [facility]

## 2023-08-06 ENCOUNTER — Encounter: Payer: Self-pay | Admitting: Internal Medicine

## 2023-08-06 DIAGNOSIS — R062 Wheezing: Secondary | ICD-10-CM | POA: Insufficient documentation

## 2023-08-06 DIAGNOSIS — E669 Obesity, unspecified: Secondary | ICD-10-CM | POA: Insufficient documentation

## 2023-08-06 NOTE — Assessment & Plan Note (Signed)
Mild to mod, for antibx course zpack, cough med prn, declines cxr,,  to f/u any worsening symptoms or concerns

## 2023-08-06 NOTE — Assessment & Plan Note (Signed)
 For phentermine 30 mg every day, f/u next visit

## 2023-08-06 NOTE — Assessment & Plan Note (Signed)
" >>  ASSESSMENT AND PLAN FOR OBESITY WRITTEN ON 08/06/2023  6:49 PM BY NORLEEN LYNWOOD ORN, MD  For phentermine  30 mg every day, f/u next visit "

## 2023-08-06 NOTE — Assessment & Plan Note (Signed)
 Pt counsled to quit, pt not ready

## 2023-08-06 NOTE — Assessment & Plan Note (Signed)
 Lab Results  Component Value Date   HGBA1C 5.8 03/09/2023   Stable, pt to continue current medical treatment  - diet, wt control

## 2023-08-06 NOTE — Assessment & Plan Note (Addendum)
Mild to mod, for prednisone taper, albuterol hfa prn,  to f/u any worsening symptoms or concerns

## 2023-08-08 DIAGNOSIS — M5416 Radiculopathy, lumbar region: Secondary | ICD-10-CM | POA: Diagnosis not present

## 2023-08-13 DIAGNOSIS — H524 Presbyopia: Secondary | ICD-10-CM | POA: Diagnosis not present

## 2023-09-12 DIAGNOSIS — M7061 Trochanteric bursitis, right hip: Secondary | ICD-10-CM | POA: Diagnosis not present

## 2023-11-08 DIAGNOSIS — M5416 Radiculopathy, lumbar region: Secondary | ICD-10-CM | POA: Diagnosis not present

## 2023-11-09 DIAGNOSIS — Z08 Encounter for follow-up examination after completed treatment for malignant neoplasm: Secondary | ICD-10-CM | POA: Diagnosis not present

## 2023-11-09 DIAGNOSIS — L820 Inflamed seborrheic keratosis: Secondary | ICD-10-CM | POA: Diagnosis not present

## 2023-11-09 DIAGNOSIS — Z85828 Personal history of other malignant neoplasm of skin: Secondary | ICD-10-CM | POA: Diagnosis not present

## 2023-12-01 ENCOUNTER — Other Ambulatory Visit (HOSPITAL_COMMUNITY): Payer: Self-pay | Admitting: Neurological Surgery

## 2023-12-01 DIAGNOSIS — M5416 Radiculopathy, lumbar region: Secondary | ICD-10-CM | POA: Diagnosis not present

## 2023-12-08 ENCOUNTER — Other Ambulatory Visit (HOSPITAL_COMMUNITY): Payer: Self-pay | Admitting: Student

## 2023-12-08 ENCOUNTER — Ambulatory Visit (HOSPITAL_COMMUNITY)

## 2023-12-08 DIAGNOSIS — Z9689 Presence of other specified functional implants: Secondary | ICD-10-CM

## 2023-12-21 ENCOUNTER — Ambulatory Visit (HOSPITAL_COMMUNITY)

## 2023-12-21 ENCOUNTER — Ambulatory Visit (HOSPITAL_COMMUNITY): Admission: RE | Admit: 2023-12-21 | Source: Ambulatory Visit

## 2023-12-21 ENCOUNTER — Encounter (HOSPITAL_COMMUNITY): Payer: Self-pay

## 2023-12-26 ENCOUNTER — Ambulatory Visit (HOSPITAL_COMMUNITY)
Admission: RE | Admit: 2023-12-26 | Discharge: 2023-12-26 | Disposition: A | Discharge status: Home / Self Care | Source: Ambulatory Visit | Attending: Neurological Surgery | Admitting: Neurological Surgery

## 2023-12-26 DIAGNOSIS — M5416 Radiculopathy, lumbar region: Secondary | ICD-10-CM | POA: Diagnosis not present

## 2023-12-26 DIAGNOSIS — M7061 Trochanteric bursitis, right hip: Secondary | ICD-10-CM | POA: Diagnosis not present

## 2023-12-26 MED ORDER — GADOBUTROL 1 MMOL/ML IV SOLN
10.0000 mL | Freq: Once | INTRAVENOUS | Status: AC | PRN
  Administered 2023-12-26: 10 mL via INTRAVENOUS

## 2023-12-28 ENCOUNTER — Encounter: Payer: Self-pay | Admitting: Internal Medicine

## 2023-12-28 ENCOUNTER — Ambulatory Visit (INDEPENDENT_AMBULATORY_CARE_PROVIDER_SITE_OTHER): Admitting: Internal Medicine

## 2023-12-28 ENCOUNTER — Ambulatory Visit (INDEPENDENT_AMBULATORY_CARE_PROVIDER_SITE_OTHER)

## 2023-12-28 VITALS — BP 164/94 | HR 96 | Temp 98.4°F | Ht 71.0 in | Wt 264.0 lb

## 2023-12-28 DIAGNOSIS — R062 Wheezing: Secondary | ICD-10-CM | POA: Diagnosis not present

## 2023-12-28 DIAGNOSIS — R051 Acute cough: Secondary | ICD-10-CM | POA: Diagnosis not present

## 2023-12-28 DIAGNOSIS — F17200 Nicotine dependence, unspecified, uncomplicated: Secondary | ICD-10-CM

## 2023-12-28 DIAGNOSIS — R059 Cough, unspecified: Secondary | ICD-10-CM | POA: Diagnosis not present

## 2023-12-28 DIAGNOSIS — R739 Hyperglycemia, unspecified: Secondary | ICD-10-CM

## 2023-12-28 DIAGNOSIS — L02214 Cutaneous abscess of groin: Secondary | ICD-10-CM | POA: Diagnosis not present

## 2023-12-28 MED ORDER — PREDNISONE 10 MG PO TABS: ORAL_TABLET | ORAL | 0 refills | Status: DC

## 2023-12-28 MED ORDER — ALBUTEROL SULFATE HFA 108 (90 BASE) MCG/ACT IN AERS: 1.0000 | INHALATION_SPRAY | Freq: Four times a day (QID) | RESPIRATORY_TRACT | 11 refills | Status: AC | PRN

## 2023-12-28 MED ORDER — LEVOFLOXACIN 500 MG PO TABS: 500.0000 mg | ORAL_TABLET | Freq: Every day | ORAL | 0 refills | Status: DC

## 2023-12-28 MED ORDER — HYDROCODONE BIT-HOMATROP MBR 5-1.5 MG/5ML PO SOLN: 5.0000 mL | Freq: Four times a day (QID) | ORAL | 0 refills | Status: AC | PRN

## 2023-12-28 NOTE — Patient Instructions (Signed)
 Please take all new medication as prescribed - the antibiotic, cough medicine, prednisone , and inhaler  Please continue all other medications as before, and refills have been done if requested.  Please have the pharmacy call with any other refills you may need.  Please keep your appointments with your specialists as you may have planned  Please go to the XRAY Department in the first floor for the x-ray testing  You will be contacted by phone if any changes need to be made immediately.  Otherwise, you will receive a letter about your results with an explanation, but please check with MyChart first.

## 2023-12-29 ENCOUNTER — Encounter: Payer: Self-pay | Admitting: Internal Medicine

## 2023-12-29 DIAGNOSIS — L02214 Cutaneous abscess of groin: Secondary | ICD-10-CM | POA: Insufficient documentation

## 2023-12-29 DIAGNOSIS — M5416 Radiculopathy, lumbar region: Secondary | ICD-10-CM | POA: Diagnosis not present

## 2023-12-29 NOTE — Assessment & Plan Note (Signed)
 Pt counsled to quit, pt not ready

## 2023-12-29 NOTE — Addendum Note (Signed)
 Addended by: NORLEEN LYNWOOD ORN on: 12/29/2023 08:06 PM   Modules accepted: Level of Service

## 2023-12-29 NOTE — Assessment & Plan Note (Signed)
 Mild to mod, for prednisone taper, inhaler prn,  to f/u any worsening symptoms or concerns

## 2023-12-29 NOTE — Assessment & Plan Note (Signed)
 Mild to mod, c/w bronchitis vs pna, for antibx course levaquin  500 mg every day, cough med prn, for CXR,   to f/u any worsening symptoms or concerns

## 2023-12-29 NOTE — Progress Notes (Addendum)
 Patient ID: [name], male   DOB: [birth year], 63 y.o.   MRN: [id number]        Chief Complaint: follow up cough, wheezing, left groin abscess, hyperglycemia, smoker       HPI:  [name] is a 63 y.o. male Here with acute onset mild to mod 2-3 days ST, HA, general weakness and malaise, with prod cough greenish sputum, but Pt denies chest pain, increased sob or doe, wheezing, orthopnea, PND, increased LE swelling, palpitations, dizziness or syncope, except for onset mild wheezing, sob doe since yesterday.   Pt denies polydipsia, polyuria, or new focal neuro s/s.   Also has incidental left groin 1.5 cm area red, tender, swelling.         Wt Readings from Last 3 Encounters:  12/28/23 264 lb (119.7 kg)  08/04/23 270 lb (122.5 kg)  07/11/23 262 lb (118.8 kg)   BP Readings from Last 3 Encounters:  12/28/23 (!) 164/94  08/04/23 (!) 148/86  05/20/23 128/80         Past Medical History:  Diagnosis Date   Allergy    Anxiety    Arthritis    Back   Chronic back pain    Colon polyps    COPD (chronic obstructive pulmonary disease) (HCC)    Depression    GERD (gastroesophageal reflux disease)    every now and then   Headache    Hyperlipidemia 09/26/2015   Lumbar surgical wound fluid collection    lumbar wound dehiscence   Pneumonia 2020   Past Surgical History:  Procedure Laterality Date   BACK SURGERY     COLONOSCOPY     COLONOSCOPY W/ BIOPSIES AND POLYPECTOMY     EYE SURGERY     IR US  GUIDE BX ASP/DRAIN  11/18/2020   KNEE ARTHROSCOPY WITH MEDIAL MENISECTOMY Left 01/23/2016   Procedure: KNEE ARTHROSCOPY WITH MEDIAL MENISECTOMY;  Surgeon: Maude Herald, MD;  Location: [facility];  Service: Orthopedics;  Laterality: Left;   LUMBAR LAMINECTOMY/DECOMPRESSION MICRODISCECTOMY N/A 06/09/2020   Procedure: Decompressive laminectomy Lumbarone -two;  Surgeon: Joshua Alm RAMAN, MD;  Location: [facility] OR;  Service: Neurosurgery;  Laterality: N/A;   LUMBAR WOUND DEBRIDEMENT  N/A 03/21/2014   Procedure: LUMBAR WOUND DEBRIDEMENT;  Surgeon: Alm RAMAN Joshua, MD;  Location: [facility];  Service: Neurosurgery;  Laterality: N/A;   MAXIMUM ACCESS (MAS)POSTERIOR LUMBAR INTERBODY FUSION (PLIF) 1 LEVEL N/A 02/14/2014   Procedure: FOR MAXIMUM ACCESS SURGERY POSTERIOR LUMBAR INTERBODY FUSION LUMBAR TWO-THREE;  Surgeon: Alm RAMAN Joshua, MD;  Location: [facility];  Service: Neurosurgery;  Laterality: N/A;   NECK SURGERY     SHOULDER SURGERY     SPINE SURGERY     TONSILLECTOMY      reports that he has been smoking cigarettes. He has a 22 pack-year smoking history. He has quit using smokeless tobacco.  His smokeless tobacco use included snuff. He reports current alcohol use of about 8.0 standard drinks of alcohol per week. He reports that he does not use drugs. family history includes Aneurysm in his maternal grandfather; Aneurysm (age of onset: 2) in his maternal aunt; Colon cancer in his maternal grandfather; Colon cancer (age of onset: 76) in his father; Colon polyps in his brother; Depression (age of onset: 32) in his brother; Heart disease in his maternal aunt; Hypertension in his mother; Stomach cancer in his father; Stomach cancer (age of onset: 80) in his paternal uncle; Stomach cancer (age of onset: 36) in his paternal  uncle; Stomach cancer (age of onset: 9) in his paternal grandfather; Suicidality in his brother. Allergies  Allergen Reactions   Cefuroxime Axetil Hives   Current Outpatient Medications on File Prior to Visit  Medication Sig Dispense Refill   acetaminophen  (TYLENOL ) 325 MG tablet Take 650 mg by mouth every 6 (six) hours as needed for mild pain, fever or headache.     ascorbic acid  (VITAMIN C) 500 MG tablet Take 500 mg by mouth daily.     atorvastatin  (LIPITOR) 10 MG tablet TAKE 1 TABLET BY MOUTH EVERY DAY 90 tablet 3   bacitracin  ointment Apply 1 Application topically 2 (two) times daily. 120 g 0   Cholecalciferol  (VITAMIN D -3) 25 MCG (1000 UT) CAPS Take by  mouth daily.     clindamycin  (CLEOCIN ) 300 MG capsule Take 1 capsule (300 mg total) by mouth 3 (three) times daily. 21 capsule 0   cyanocobalamin  (VITAMIN B12) 1000 MCG tablet Take 1,000 mcg by mouth daily.     DULoxetine  (CYMBALTA ) 60 MG capsule Take 60 mg by mouth at bedtime.  2   folic acid  (FOLVITE ) 800 MCG tablet Take 400 mcg by mouth daily.     gabapentin  (NEURONTIN ) 300 MG capsule Take 1 capsule (300 mg total) by mouth 3 (three) times daily. 90 capsule 3   meloxicam  (MOBIC ) 15 MG tablet Take 1 tablet (15 mg total) by mouth daily. 90 tablet 3   Multiple Vitamins-Minerals (CENTRUM SILVER 50+MEN) TABS Take by mouth.     phentermine  30 MG capsule Take 1 capsule (30 mg total) by mouth every morning. 30 capsule 5   tiZANidine  (ZANAFLEX ) 4 MG tablet Take 4 mg by mouth 3 (three) times daily.   2   triamcinolone  (NASACORT ) 55 MCG/ACT AERO nasal inhaler Place 2 sprays into the nose daily. 1 each 12   vitamin E  180 MG (400 UNITS) capsule Take 400 Units by mouth daily.     No current facility-administered medications on file prior to visit.        ROS:  All others reviewed and negative.  Objective        PE:  BP (!) 164/94   Pulse 96   Temp 98.4 F (36.9 C)   Ht 5' 11 (1.803 m)   Wt 264 lb (119.7 kg)   SpO2 97%   BMI 36.82 kg/m                 Constitutional: Pt appears mild ill               HENT: Head: NCAT.                Right Ear: External ear normal.                 Left Ear: External ear normal. Bilat tm's with mild erythema.  Max sinus areas non tender.  Pharynx with mild erythema, no exudate               Eyes: . Pupils are equal, round, and reactive to light. Conjunctivae and EOM are normal               Nose: without d/c or deformity               Neck: Neck supple. Gross normal ROM               Cardiovascular: Normal rate and regular rhythm.  Pulmonary/Chest: Effort normal and breath sounds without rales but with few mild bilateral wheezing.                 Neurological: Pt is alert. At baseline orientation, motor grossly intact               Skin: Skin is warm. No rashes, LE edema - none, left groin inguinal area with 1.5 cm red,tender swelling without abscess or drainage               Psychiatric: Pt behavior is normal without agitation   Micro: none  Cardiac tracings I have personally interpreted today:  none  Pertinent Radiological findings (summarize): none   Lab Results  Component Value Date   WBC 11.6 (H) 03/09/2023   HGB 15.2 03/09/2023   HCT 46.2 03/09/2023   PLT 370.0 03/09/2023   GLUCOSE 97 03/09/2023   CHOL 148 03/09/2023   TRIG 154.0 (H) 03/09/2023   HDL 55.50 03/09/2023   LDLDIRECT 150.1 03/07/2012   LDLCALC 61 03/09/2023   ALT 26 03/09/2023   AST 16 03/09/2023   NA 140 03/09/2023   K 4.4 03/09/2023   CL 104 03/09/2023   CREATININE 0.86 03/09/2023   BUN 15 03/09/2023   CO2 27 03/09/2023   TSH 2.07 03/09/2023   PSA 0.71 03/09/2023   INR 0.9 07/13/2022   HGBA1C 5.8 03/09/2023   Assessment/Plan:  March Steyer Omary is a 63 y.o. White or Caucasian [1] male with  has a past medical history of Allergy, Anxiety, Arthritis, Chronic back pain, Colon polyps, COPD (chronic obstructive pulmonary disease) (HCC), Depression, GERD (gastroesophageal reflux disease), Headache, Hyperlipidemia (09/26/2015), Lumbar surgical wound fluid collection, and Pneumonia (2020).  Cough Mild to mod, c/w bronchitis vs pna, for antibx course levaquin  500 mg every day, cough med prn, for CXR,   to f/u any worsening symptoms or concerns  Hyperglycemia Lab Results  Component Value Date   HGBA1C 5.8 03/09/2023   Stable, pt to continue current medical treatment  - diet, wt control   Smoker Pt counsled to quit, pt not ready  Wheezing Mild to mod, for prednisone  taper, inhaler prn, to f/u any worsening symptoms or concerns  Groin abscess Early, non fluctuant or drainage, for levaquin  500 every day as above  Followup: Return if symptoms  worsen or fail to improve.  Lynwood Rush, MD 12/29/2023 8:06 PM [facility] Medicine

## 2023-12-29 NOTE — Assessment & Plan Note (Signed)
 Early, non fluctuant or drainage, for levaquin  500 every day as above

## 2023-12-29 NOTE — Assessment & Plan Note (Signed)
 Lab Results  Component Value Date   HGBA1C 5.8 03/09/2023   Stable, pt to continue current medical treatment  - diet, wt control

## 2024-01-05 DIAGNOSIS — M47816 Spondylosis without myelopathy or radiculopathy, lumbar region: Secondary | ICD-10-CM | POA: Diagnosis not present

## 2024-01-09 ENCOUNTER — Ambulatory Visit: Payer: Self-pay | Admitting: Internal Medicine

## 2024-02-09 DIAGNOSIS — M5416 Radiculopathy, lumbar region: Secondary | ICD-10-CM | POA: Diagnosis not present

## 2024-03-12 ENCOUNTER — Other Ambulatory Visit: Payer: Self-pay | Admitting: Neurological Surgery

## 2024-03-13 ENCOUNTER — Other Ambulatory Visit: Payer: Self-pay | Admitting: Internal Medicine

## 2024-03-19 ENCOUNTER — Telehealth: Payer: Self-pay

## 2024-03-19 NOTE — Telephone Encounter (Signed)
 Copied from CRM [id number]. Topic: General - Call Back - No Documentation >> Mar 15, 2024  4:07 PM Alfonso ORN wrote: Reason for CRM:  [facility] took bp during home visit and called to share: bp reading 160/90 later on 127/99 last 137/76

## 2024-03-19 NOTE — Telephone Encounter (Signed)
 Ok to ask pt to check BP twice per day (at random times) and let us  know in 1 week if the average is too high, such as > 130/80.  Or we can see in office as well if he prefers.     Thanks

## 2024-03-20 NOTE — Telephone Encounter (Signed)
 Called and let Pt know

## 2024-04-19 NOTE — Pre-Procedure Instructions (Signed)
 Surgical Instructions   Your procedure is scheduled on April 30, 2024. Report to [facility] Main Entrance A at 7:45 A.M., then check in with the Admitting office. Any questions or running late day of surgery: call [phone]  Questions prior to your surgery date: call [phone], Monday-Friday, 8am-4pm. If you experience any cold or flu symptoms such as cough, fever, chills, shortness of breath, etc. between now and your scheduled surgery, please notify us  at the above number.     Remember:  Do not eat or drink after midnight the night before your surgery   Take these medicines the morning of surgery with A SIP OF WATER: gabapentin  (NEURONTIN )  tiZANidine  (ZANAFLEX )    May take these medicines IF NEEDED: acetaminophen  (TYLENOL )  albuterol  (VENTOLIN  HFA) inhaler - please bring inhaler with you morning of surgery triamcinolone  (NASACORT ) nasal inhaler    STOP taking your phentermine  AS SOON AS POSSIBLE prior to surgery.   One week prior to surgery, STOP taking any Aspirin (unless otherwise instructed by your surgeon) Aleve, Naproxen, Ibuprofen , Motrin , Advil , Goody's, BC's, all herbal medications, fish oil, and non-prescription vitamins. This includes your medication: meloxicam  (MOBIC )                      Do NOT Smoke (Tobacco/Vaping) for 24 hours prior to your procedure.  If you use a CPAP at night, you may bring your mask/headgear for your overnight stay.   You will be asked to remove any contacts, glasses, piercing's, hearing aid's, dentures/partials prior to surgery. Please bring cases for these items if needed.    Patients discharged the day of surgery will not be allowed to drive home, and someone needs to stay with them for 24 hours.  SURGICAL WAITING ROOM VISITATION Patients may have no more than 2 support people in the waiting area - these visitors may rotate.   Pre-op nurse will coordinate an appropriate time for 1 ADULT support person, who may not rotate,  to accompany patient in pre-op.  Children under the age of 31 must have an adult with them who is not the patient and must remain in the main waiting area with an adult.  If the patient needs to stay at the hospital during part of their recovery, the visitor guidelines for inpatient rooms apply.  Please refer to the [facility] website for the visitor guidelines for any additional information.   If you received a COVID test during your pre-op visit  it is requested that you wear a mask when out in public, stay away from anyone that may not be feeling well and notify your surgeon if you develop symptoms. If you have been in contact with anyone that has tested positive in the last 10 days please notify you surgeon.      Pre-operative 4 CHG Bathing Instructions   You can play a key role in reducing the risk of infection after surgery. Your skin needs to be as free of germs as possible. You can reduce the number of germs on your skin by washing with CHG (chlorhexidine  gluconate) soap before surgery. CHG is an antiseptic soap that kills germs and continues to kill germs even after washing.   DO NOT use if you have an allergy to chlorhexidine /CHG or antibacterial soaps. If your skin becomes reddened or irritated, stop using the CHG and notify one of our RNs at [phone].   Please shower with the CHG soap starting 4 days before surgery using the following schedule:  Please keep in mind the following:  DO NOT shave, including legs and underarms, starting the day of your first shower.   You may shave your face at any point before/day of surgery.  Place clean sheets on your bed the day you start using CHG soap. Use a clean washcloth (not used since being washed) for each shower. DO NOT sleep with pets once you start using the CHG.   CHG Shower Instructions:  Wash your face and private area with normal soap. If you choose to wash your hair, wash first with your normal shampoo.  After you  use shampoo/soap, rinse your hair and body thoroughly to remove shampoo/soap residue.  Turn the water OFF and apply  bottle of CHG soap to a CLEAN washcloth.  Apply CHG soap ONLY FROM YOUR NECK DOWN TO YOUR TOES (washing for 3-5 minutes)  DO NOT use CHG soap on face, private areas, open wounds, or sores.  Pay special attention to the area where your surgery is being performed.  If you are having back surgery, having someone wash your back for you may be helpful. Wait 2 minutes after CHG soap is applied, then you may rinse off the CHG soap.  Pat dry with a clean towel  Put on clean clothes/pajamas   If you choose to wear lotion, please use ONLY the CHG-compatible lotions that are listed below.  Additional instructions for the day of surgery:  If you choose, you may shower the morning of surgery with an antibacterial soap.  DO NOT APPLY any lotions, deodorants, cologne, or perfumes.   Do not bring valuables to the hospital. [facility] is not responsible for any belongings/valuables. Do not wear nail polish, gel polish, artificial nails, or any other type of covering on natural nails (fingers and toes) Do not wear jewelry or makeup Put on clean/comfortable clothes.  Please brush your teeth.  Ask your nurse before applying any prescription medications to the skin.     CHG Compatible Lotions   Aveeno Moisturizing lotion  Cetaphil Moisturizing Cream  Cetaphil Moisturizing Lotion  Clairol Herbal Essence Moisturizing Lotion, Dry Skin  Clairol Herbal Essence Moisturizing Lotion, Extra Dry Skin  Clairol Herbal Essence Moisturizing Lotion, Normal Skin  Curel Age Defying Therapeutic Moisturizing Lotion with Alpha Hydroxy  Curel Extreme Care Body Lotion  Curel Soothing Hands Moisturizing Hand Lotion  Curel Therapeutic Moisturizing Cream, Fragrance-Free  Curel Therapeutic Moisturizing Lotion, Fragrance-Free  Curel Therapeutic Moisturizing Lotion, Original Formula  Eucerin Daily  Replenishing Lotion  Eucerin Dry Skin Therapy Plus Alpha Hydroxy Crme  Eucerin Dry Skin Therapy Plus Alpha Hydroxy Lotion  Eucerin Original Crme  Eucerin Original Lotion  Eucerin Plus Crme Eucerin Plus Lotion  Eucerin TriLipid Replenishing Lotion  Keri Anti-Bacterial Hand Lotion  Keri Deep Conditioning Original Lotion Dry Skin Formula Softly Scented  Keri Deep Conditioning Original Lotion, Fragrance Free Sensitive Skin Formula  Keri Lotion Fast Absorbing Fragrance Free Sensitive Skin Formula  Keri Lotion Fast Absorbing Softly Scented Dry Skin Formula  Keri Original Lotion  Keri Skin Renewal Lotion Keri Silky Smooth Lotion  Keri Silky Smooth Sensitive Skin Lotion  Nivea Body Creamy Conditioning Oil  Nivea Body Extra Enriched Lotion  Nivea Body Original Lotion  Nivea Body Sheer Moisturizing Lotion Nivea Crme  Nivea Skin Firming Lotion  NutraDerm 30 Skin Lotion  NutraDerm Skin Lotion  NutraDerm Therapeutic Skin Cream  NutraDerm Therapeutic Skin Lotion  ProShield Protective Hand Cream  Provon moisturizing lotion  Please read over the following fact sheets that  you were given.

## 2024-04-20 ENCOUNTER — Encounter (HOSPITAL_COMMUNITY)
Admission: RE | Admit: 2024-04-20 | Discharge: 2024-04-20 | Disposition: A | Discharge status: Home / Self Care | Source: Ambulatory Visit | Attending: Neurological Surgery | Admitting: Neurological Surgery

## 2024-04-20 ENCOUNTER — Encounter (HOSPITAL_COMMUNITY): Payer: Self-pay

## 2024-04-20 ENCOUNTER — Other Ambulatory Visit: Payer: Self-pay

## 2024-04-20 VITALS — BP 170/97 | HR 92 | Temp 98.2°F | Resp 18 | Ht 71.0 in | Wt 263.8 lb

## 2024-04-20 DIAGNOSIS — Z01818 Encounter for other preprocedural examination: Secondary | ICD-10-CM

## 2024-04-20 DIAGNOSIS — Z01812 Encounter for preprocedural laboratory examination: Secondary | ICD-10-CM | POA: Diagnosis present

## 2024-04-20 HISTORY — DX: Personal history of urinary calculi: Z87.442

## 2024-04-20 LAB — CBC
HCT: 47.3 % (ref 39.0–52.0)
Hemoglobin: 15.7 g/dL (ref 13.0–17.0)
MCH: 32 pg (ref 26.0–34.0)
MCHC: 33.2 g/dL (ref 30.0–36.0)
MCV: 96.3 fL (ref 80.0–100.0)
Platelets: 300 K/uL (ref 150–400)
RBC: 4.91 MIL/uL (ref 4.22–5.81)
RDW: 13.7 % (ref 11.5–15.5)
WBC: 13 K/uL — ABNORMAL HIGH (ref 4.0–10.5)
nRBC: 0 % (ref 0.0–0.2)

## 2024-04-20 LAB — TYPE AND SCREEN
ABO/RH(D): O POS
Antibody Screen: NEGATIVE

## 2024-04-20 LAB — BASIC METABOLIC PANEL WITH GFR
Anion gap: 10 (ref 5–15)
BUN: 16 mg/dL (ref 8–23)
CO2: 25 mmol/L (ref 22–32)
Calcium: 9.5 mg/dL (ref 8.9–10.3)
Chloride: 104 mmol/L (ref 98–111)
Creatinine, Ser: 0.77 mg/dL (ref 0.61–1.24)
GFR, Estimated: >60 mL/min
Glucose, Bld: 112 mg/dL — ABNORMAL HIGH (ref 70–99)
Potassium: 4.4 mmol/L (ref 3.5–5.1)
Sodium: 139 mmol/L (ref 135–145)

## 2024-04-20 LAB — SURGICAL PCR SCREEN
MRSA, PCR: NEGATIVE
Staphylococcus aureus: NEGATIVE

## 2024-04-20 LAB — PROTIME-INR
INR: 0.9 (ref 0.8–1.2)
Prothrombin Time: 12.8 s (ref 11.4–15.2)

## 2024-04-20 NOTE — Progress Notes (Signed)
 PCP - Dr. Lynwood Rush Cardiologist - Pt saw a cardiologist in 2002 after an allergic reaction to a medication was thought to be a heart attack. Pt completed a stress test which was normal. No additional follow-up needed  PPM/ICD - Denies Device Orders - n/a Rep Notified - n/a  Chest x-ray - n/a EKG - Denies any within last year Stress Test - 07/16/2000 ECHO - Denies Cardiac Cath - Denies  Sleep Study - Denies CPAP - n/a  No DM  Last dose of GLP1 agonist- n/a GLP1 instructions: n/a  Blood Thinner Instructions: n/a Aspirin Instructions: n/a  NPO after midnight  COVID TEST- n/a   Anesthesia review: No. Pt has not taken his phentermine  for a few days due to medication not working. Pt still instructed to not restart medication prior to surgery   Patient denies shortness of breath, fever, cough and chest pain at PAT appointment. Pt denies any respiratory illness/infection in the last two months.    All instructions explained to the patient, with a verbal understanding of the material. Patient agrees to go over the instructions while at home for a better understanding. Patient also instructed to self quarantine after being tested for COVID-19. The opportunity to ask questions was provided.

## 2024-04-30 ENCOUNTER — Ambulatory Visit (HOSPITAL_COMMUNITY): Payer: Self-pay | Admitting: Certified Registered Nurse Anesthetist

## 2024-04-30 ENCOUNTER — Encounter (HOSPITAL_COMMUNITY): Payer: Self-pay | Admitting: Neurological Surgery

## 2024-04-30 ENCOUNTER — Observation Stay (HOSPITAL_COMMUNITY)
Admission: RE | Admit: 2024-04-30 | Discharge: 2024-05-01 | Disposition: A | Discharge status: Home / Self Care | Source: Ambulatory Visit | Attending: Neurological Surgery | Admitting: Neurological Surgery

## 2024-04-30 ENCOUNTER — Ambulatory Visit (HOSPITAL_COMMUNITY)

## 2024-04-30 ENCOUNTER — Other Ambulatory Visit: Payer: Self-pay

## 2024-04-30 ENCOUNTER — Encounter (HOSPITAL_COMMUNITY)
Admission: RE | Disposition: A | Discharge status: Home / Self Care | Payer: Self-pay | Source: Ambulatory Visit | Attending: Neurological Surgery

## 2024-04-30 DIAGNOSIS — M51369 Other intervertebral disc degeneration, lumbar region without mention of lumbar back pain or lower extremity pain: Secondary | ICD-10-CM

## 2024-04-30 DIAGNOSIS — J449 Chronic obstructive pulmonary disease, unspecified: Secondary | ICD-10-CM | POA: Insufficient documentation

## 2024-04-30 DIAGNOSIS — M4316 Spondylolisthesis, lumbar region: Secondary | ICD-10-CM | POA: Insufficient documentation

## 2024-04-30 DIAGNOSIS — M532X6 Spinal instabilities, lumbar region: Secondary | ICD-10-CM | POA: Insufficient documentation

## 2024-04-30 DIAGNOSIS — M51361 Other intervertebral disc degeneration, lumbar region with lower extremity pain only: Secondary | ICD-10-CM | POA: Diagnosis present

## 2024-04-30 DIAGNOSIS — F418 Other specified anxiety disorders: Secondary | ICD-10-CM

## 2024-04-30 DIAGNOSIS — F17210 Nicotine dependence, cigarettes, uncomplicated: Secondary | ICD-10-CM | POA: Diagnosis not present

## 2024-04-30 DIAGNOSIS — Z981 Arthrodesis status: Principal | ICD-10-CM

## 2024-04-30 HISTORY — PX: LAMINECTOMY WITH POSTERIOR LATERAL ARTHRODESIS LEVEL 3: SHX6337

## 2024-04-30 HISTORY — PX: SPINAL CORD STIMULATOR REMOVAL: SHX5379

## 2024-04-30 SURGERY — LAMINECTOMY WITH POSTERIOR LATERAL ARTHRODESIS LEVEL 3
Anesthesia: General | Site: Back

## 2024-04-30 MED ORDER — TIZANIDINE HCL 4 MG PO TABS
4.0000 mg | ORAL_TABLET | Freq: Three times a day (TID) | ORAL | Status: DC
  Administered 2024-04-30 – 2024-05-01 (×3): 4 mg via ORAL
  Filled 2024-04-30 (×3): qty 1

## 2024-04-30 MED ORDER — POTASSIUM CHLORIDE IN NACL 20-0.9 MEQ/L-% IV SOLN
INTRAVENOUS | Status: DC
  Filled 2024-04-30 (×2): qty 1000

## 2024-04-30 MED ORDER — DEXMEDETOMIDINE HCL IN NACL 80 MCG/20ML IV SOLN
INTRAVENOUS | Status: AC
  Filled 2024-04-30: qty 20

## 2024-04-30 MED ORDER — SODIUM CHLORIDE 0.9 % IV SOLN: 250.0000 mL | INTRAVENOUS | Status: DC

## 2024-04-30 MED ORDER — VANCOMYCIN HCL 1500 MG/300ML IV SOLN
1500.0000 mg | INTRAVENOUS | Status: AC
  Administered 2024-04-30: 1500 mg via INTRAVENOUS
  Filled 2024-04-30: qty 300

## 2024-04-30 MED ORDER — HYDROMORPHONE HCL 1 MG/ML IJ SOLN
INTRAMUSCULAR | Status: AC
  Filled 2024-04-30: qty 0.5

## 2024-04-30 MED ORDER — DEXAMETHASONE SOD PHOSPHATE PF 10 MG/ML IJ SOLN
INTRAMUSCULAR | Status: DC | PRN
  Administered 2024-04-30: 10 mg via INTRAVENOUS

## 2024-04-30 MED ORDER — THROMBIN 20000 UNITS EX SOLR
CUTANEOUS | Status: AC
  Filled 2024-04-30: qty 20000

## 2024-04-30 MED ORDER — SUGAMMADEX SODIUM 200 MG/2ML IV SOLN
INTRAVENOUS | Status: DC | PRN
  Administered 2024-04-30: 250 mg via INTRAVENOUS

## 2024-04-30 MED ORDER — ACETAMINOPHEN 500 MG PO TABS
1000.0000 mg | ORAL_TABLET | ORAL | Status: AC
  Administered 2024-04-30: 1000 mg via ORAL
  Filled 2024-04-30: qty 2

## 2024-04-30 MED ORDER — FENTANYL CITRATE (PF) 100 MCG/2ML IJ SOLN
INTRAMUSCULAR | Status: AC
  Filled 2024-04-30: qty 2

## 2024-04-30 MED ORDER — ALBUTEROL SULFATE (2.5 MG/3ML) 0.083% IN NEBU: 2.5000 mg | INHALATION_SOLUTION | Freq: Four times a day (QID) | RESPIRATORY_TRACT | Status: DC | PRN

## 2024-04-30 MED ORDER — HYDROMORPHONE HCL 1 MG/ML IJ SOLN
INTRAMUSCULAR | Status: DC | PRN
  Administered 2024-04-30 (×2): .5 mg via INTRAVENOUS

## 2024-04-30 MED ORDER — ACETAMINOPHEN 500 MG PO TABS: 1000.0000 mg | ORAL_TABLET | Freq: Once | ORAL | Status: DC

## 2024-04-30 MED ORDER — LIDOCAINE 2% (20 MG/ML) 5 ML SYRINGE
INTRAMUSCULAR | Status: DC | PRN
  Administered 2024-04-30: 60 mg via INTRAVENOUS

## 2024-04-30 MED ORDER — PHENYLEPHRINE HCL-NACL 20-0.9 MG/250ML-% IV SOLN
INTRAVENOUS | Status: DC | PRN
  Administered 2024-04-30: 20 ug/min via INTRAVENOUS

## 2024-04-30 MED ORDER — ONDANSETRON HCL 4 MG/2ML IJ SOLN
INTRAMUSCULAR | Status: AC
  Filled 2024-04-30: qty 2

## 2024-04-30 MED ORDER — DULOXETINE HCL 60 MG PO CPEP
60.0000 mg | ORAL_CAPSULE | Freq: Every day | ORAL | Status: DC
  Administered 2024-04-30: 60 mg via ORAL
  Filled 2024-04-30: qty 1

## 2024-04-30 MED ORDER — ORAL CARE MOUTH RINSE: 15.0000 mL | Freq: Once | OROMUCOSAL | Status: AC

## 2024-04-30 MED ORDER — BUPIVACAINE HCL (PF) 0.25 % IJ SOLN
INTRAMUSCULAR | Status: AC
  Filled 2024-04-30: qty 30

## 2024-04-30 MED ORDER — GABAPENTIN 300 MG PO CAPS: 300.0000 mg | ORAL_CAPSULE | ORAL | Status: DC

## 2024-04-30 MED ORDER — OXYCODONE HCL 5 MG PO TABS
ORAL_TABLET | ORAL | Status: AC
  Filled 2024-04-30: qty 1

## 2024-04-30 MED ORDER — SENNA 8.6 MG PO TABS
1.0000 | ORAL_TABLET | Freq: Two times a day (BID) | ORAL | Status: DC
  Administered 2024-04-30 – 2024-05-01 (×2): 8.6 mg via ORAL
  Filled 2024-04-30 (×2): qty 1

## 2024-04-30 MED ORDER — AMISULPRIDE (ANTIEMETIC) 5 MG/2ML IV SOLN: 10.0000 mg | Freq: Once | INTRAVENOUS | Status: DC | PRN

## 2024-04-30 MED ORDER — PHENYLEPHRINE 80 MCG/ML (10ML) SYRINGE FOR IV PUSH (FOR BLOOD PRESSURE SUPPORT)
PREFILLED_SYRINGE | INTRAVENOUS | Status: AC
  Filled 2024-04-30: qty 10

## 2024-04-30 MED ORDER — CHLORHEXIDINE GLUCONATE CLOTH 2 % EX PADS: 6.0000 | MEDICATED_PAD | Freq: Once | CUTANEOUS | Status: DC

## 2024-04-30 MED ORDER — ROCURONIUM BROMIDE 10 MG/ML (PF) SYRINGE
PREFILLED_SYRINGE | INTRAVENOUS | Status: DC | PRN
  Administered 2024-04-30: 20 mg via INTRAVENOUS
  Administered 2024-04-30: 60 mg via INTRAVENOUS
  Administered 2024-04-30: 40 mg via INTRAVENOUS

## 2024-04-30 MED ORDER — ONDANSETRON HCL 4 MG/2ML IJ SOLN: 4.0000 mg | Freq: Four times a day (QID) | INTRAMUSCULAR | Status: DC | PRN

## 2024-04-30 MED ORDER — MENTHOL 3 MG MT LOZG: 1.0000 | LOZENGE | OROMUCOSAL | Status: DC | PRN

## 2024-04-30 MED ORDER — THROMBIN 5000 UNITS EX KIT
PACK | CUTANEOUS | Status: AC
  Filled 2024-04-30: qty 1

## 2024-04-30 MED ORDER — HYDROMORPHONE HCL 1 MG/ML IJ SOLN: 0.5000 mg | INTRAMUSCULAR | Status: DC | PRN

## 2024-04-30 MED ORDER — FENTANYL CITRATE (PF) 100 MCG/2ML IJ SOLN
25.0000 ug | INTRAMUSCULAR | Status: DC | PRN
  Administered 2024-04-30: 25 ug via INTRAVENOUS
  Administered 2024-04-30 (×2): 50 ug via INTRAVENOUS
  Administered 2024-04-30: 25 ug via INTRAVENOUS

## 2024-04-30 MED ORDER — BUPIVACAINE HCL (PF) 0.25 % IJ SOLN
INTRAMUSCULAR | Status: DC | PRN
  Administered 2024-04-30: 10 mL

## 2024-04-30 MED ORDER — ONDANSETRON HCL 4 MG PO TABS: 4.0000 mg | ORAL_TABLET | Freq: Four times a day (QID) | ORAL | Status: DC | PRN

## 2024-04-30 MED ORDER — SUGAMMADEX SODIUM 200 MG/2ML IV SOLN
INTRAVENOUS | Status: AC
  Filled 2024-04-30: qty 4

## 2024-04-30 MED ORDER — PROPOFOL 10 MG/ML IV BOLUS
INTRAVENOUS | Status: DC | PRN
  Administered 2024-04-30: 160 mg via INTRAVENOUS

## 2024-04-30 MED ORDER — OXYCODONE HCL 5 MG PO TABS
5.0000 mg | ORAL_TABLET | Freq: Once | ORAL | Status: AC | PRN
  Administered 2024-04-30: 5 mg via ORAL

## 2024-04-30 MED ORDER — ROCURONIUM BROMIDE 10 MG/ML (PF) SYRINGE
PREFILLED_SYRINGE | INTRAVENOUS | Status: AC
  Filled 2024-04-30: qty 10

## 2024-04-30 MED ORDER — SURGIPHOR WOUND IRRIGATION SYSTEM - OPTIME
TOPICAL | Status: DC | PRN
  Administered 2024-04-30: 450 mL via TOPICAL

## 2024-04-30 MED ORDER — MIDAZOLAM HCL (PF) 2 MG/2ML IJ SOLN
INTRAMUSCULAR | Status: DC | PRN
  Administered 2024-04-30: 2 mg via INTRAVENOUS

## 2024-04-30 MED ORDER — 0.9 % SODIUM CHLORIDE (POUR BTL) OPTIME
TOPICAL | Status: DC | PRN
  Administered 2024-04-30: 1000 mL

## 2024-04-30 MED ORDER — FENTANYL CITRATE (PF) 100 MCG/2ML IJ SOLN
INTRAMUSCULAR | Status: DC | PRN
  Administered 2024-04-30: 100 ug via INTRAVENOUS
  Administered 2024-04-30 (×2): 50 ug via INTRAVENOUS

## 2024-04-30 MED ORDER — ACETAMINOPHEN 500 MG PO TABS
1000.0000 mg | ORAL_TABLET | Freq: Four times a day (QID) | ORAL | Status: DC
  Administered 2024-04-30 – 2024-05-01 (×3): 1000 mg via ORAL
  Filled 2024-04-30 (×3): qty 2

## 2024-04-30 MED ORDER — SODIUM CHLORIDE 0.9 % IV SOLN: 12.5000 mg | INTRAVENOUS | Status: DC | PRN

## 2024-04-30 MED ORDER — CEFAZOLIN SODIUM-DEXTROSE 2-4 GM/100ML-% IV SOLN: 2.0000 g | Freq: Three times a day (TID) | INTRAVENOUS | Status: DC

## 2024-04-30 MED ORDER — OXYCODONE HCL 5 MG/5ML PO SOLN: 5.0000 mg | Freq: Once | ORAL | Status: AC | PRN

## 2024-04-30 MED ORDER — CELECOXIB 200 MG PO CAPS
200.0000 mg | ORAL_CAPSULE | Freq: Two times a day (BID) | ORAL | Status: DC
  Administered 2024-04-30 – 2024-05-01 (×2): 200 mg via ORAL
  Filled 2024-04-30 (×2): qty 1

## 2024-04-30 MED ORDER — OXYCODONE HCL 5 MG PO TABS
10.0000 mg | ORAL_TABLET | ORAL | Status: DC | PRN
  Administered 2024-04-30 – 2024-05-01 (×5): 10 mg via ORAL
  Filled 2024-04-30 (×5): qty 2

## 2024-04-30 MED ORDER — OXYCODONE HCL 5 MG PO TABS: 5.0000 mg | ORAL_TABLET | ORAL | Status: DC | PRN

## 2024-04-30 MED ORDER — ONDANSETRON HCL 4 MG/2ML IJ SOLN
INTRAMUSCULAR | Status: DC | PRN
  Administered 2024-04-30: 4 mg via INTRAVENOUS

## 2024-04-30 MED ORDER — LIDOCAINE 2% (20 MG/ML) 5 ML SYRINGE
INTRAMUSCULAR | Status: AC
  Filled 2024-04-30: qty 5

## 2024-04-30 MED ORDER — DEXMEDETOMIDINE HCL IN NACL 80 MCG/20ML IV SOLN
INTRAVENOUS | Status: DC | PRN
  Administered 2024-04-30: 20 ug via INTRAVENOUS

## 2024-04-30 MED ORDER — THROMBIN 5000 UNITS EX SOLR
OROMUCOSAL | Status: DC | PRN
  Administered 2024-04-30: 5 mL via TOPICAL

## 2024-04-30 MED ORDER — PROPOFOL 10 MG/ML IV BOLUS
INTRAVENOUS | Status: AC
  Filled 2024-04-30: qty 20

## 2024-04-30 MED ORDER — SODIUM CHLORIDE 0.9% FLUSH
3.0000 mL | Freq: Two times a day (BID) | INTRAVENOUS | Status: DC
  Administered 2024-04-30: 3 mL via INTRAVENOUS

## 2024-04-30 MED ORDER — GABAPENTIN 300 MG PO CAPS
300.0000 mg | ORAL_CAPSULE | Freq: Three times a day (TID) | ORAL | Status: DC
  Administered 2024-04-30 – 2024-05-01 (×3): 300 mg via ORAL
  Filled 2024-04-30 (×3): qty 1

## 2024-04-30 MED ORDER — LACTATED RINGERS IV SOLN: INTRAVENOUS | Status: DC

## 2024-04-30 MED ORDER — CELECOXIB 200 MG PO CAPS
200.0000 mg | ORAL_CAPSULE | Freq: Once | ORAL | Status: AC
  Administered 2024-04-30: 200 mg via ORAL
  Filled 2024-04-30: qty 1

## 2024-04-30 MED ORDER — THROMBIN 20000 UNITS EX SOLR
CUTANEOUS | Status: DC | PRN
  Administered 2024-04-30: 20 mL via TOPICAL

## 2024-04-30 MED ORDER — ADULT MULTIVITAMIN W/MINERALS CH
1.0000 | ORAL_TABLET | Freq: Every day | ORAL | Status: DC
  Administered 2024-04-30: 1 via ORAL
  Filled 2024-04-30: qty 1

## 2024-04-30 MED ORDER — PHENOL 1.4 % MT LIQD: 1.0000 | OROMUCOSAL | Status: DC | PRN

## 2024-04-30 MED ORDER — CHLORHEXIDINE GLUCONATE 0.12 % MT SOLN
15.0000 mL | Freq: Once | OROMUCOSAL | Status: AC
  Administered 2024-04-30: 15 mL via OROMUCOSAL
  Filled 2024-04-30: qty 15

## 2024-04-30 MED ORDER — SODIUM CHLORIDE 0.9% FLUSH: 3.0000 mL | INTRAVENOUS | Status: DC | PRN

## 2024-04-30 MED ORDER — VANCOMYCIN HCL IN DEXTROSE 1-5 GM/200ML-% IV SOLN
1000.0000 mg | Freq: Once | INTRAVENOUS | Status: AC
  Administered 2024-04-30: 1000 mg via INTRAVENOUS
  Filled 2024-04-30: qty 200

## 2024-04-30 MED ORDER — MIDAZOLAM HCL 2 MG/2ML IJ SOLN
INTRAMUSCULAR | Status: AC
  Filled 2024-04-30: qty 2

## 2024-04-30 SURGICAL SUPPLY — 57 items
ALLOGRAFT BONE FIBER KORE 5 (Bone Implant) IMPLANT
BAG COUNTER SPONGE SURGICOUNT (BAG) ×2 IMPLANT
BASKET BONE COLLECTION (BASKET) IMPLANT
BENZOIN TINCTURE PRP APPL 2/3 (GAUZE/BANDAGES/DRESSINGS) ×2 IMPLANT
BIT DRILL MAS PLIF 4.5 DISP (DRILL) IMPLANT
BIT DRILL PLIF MAS 5.0MM DISP (DRILL) IMPLANT
BLADE BONE MILL MEDIUM (MISCELLANEOUS) IMPLANT
BLADE CLIPPER SURG (BLADE) IMPLANT
BUR CARBIDE MATCH 3.0 (BURR) ×1 IMPLANT
CANISTER SUCTION 3000ML PPV (SUCTIONS) ×2 IMPLANT
CNTNR URN SCR LID CUP LEK RST (MISCELLANEOUS) ×1 IMPLANT
COVER BACK TABLE 60X90IN (DRAPES) ×1 IMPLANT
DERMABOND ADVANCED .7 DNX12 (GAUZE/BANDAGES/DRESSINGS) ×1 IMPLANT
DRAPE C-ARM 42X72 X-RAY (DRAPES) ×2 IMPLANT
DRAPE LAPAROTOMY 100X72X124 (DRAPES) ×2 IMPLANT
DRAPE SURG 17X23 STRL (DRAPES) ×2 IMPLANT
DRSG AQUACEL AG ADV 3.5X 6 (GAUZE/BANDAGES/DRESSINGS) ×1 IMPLANT
DURAPREP 26ML APPLICATOR (WOUND CARE) ×2 IMPLANT
ELECTRODE REM PT RTRN 9FT ADLT (ELECTROSURGICAL) ×2 IMPLANT
EVACUATOR 1/8 PVC DRAIN (DRAIN) IMPLANT
GAUZE 4X4 16PLY ~~LOC~~+RFID DBL (SPONGE) IMPLANT
GLOVE BIO SURGEON STRL SZ7 (GLOVE) ×1 IMPLANT
GLOVE BIO SURGEON STRL SZ8 (GLOVE) ×3 IMPLANT
GLOVE BIOGEL PI IND STRL 7.0 (GLOVE) IMPLANT
GOWN STRL REUS W/ TWL LRG LVL3 (GOWN DISPOSABLE) IMPLANT
GOWN STRL REUS W/ TWL XL LVL3 (GOWN DISPOSABLE) ×3 IMPLANT
GOWN STRL REUS W/TWL 2XL LVL3 (GOWN DISPOSABLE) IMPLANT
GRAFT BN 5X1XSPNE CVD POST DBM (Bone Implant) IMPLANT
HEMOSTAT POWDER KIT SURGIFOAM (HEMOSTASIS) ×1 IMPLANT
KIT BASIN OR (CUSTOM PROCEDURE TRAY) ×2 IMPLANT
KIT INFUSE SMALL (Orthopedic Implant) IMPLANT
KIT TURNOVER KIT B (KITS) ×2 IMPLANT
MILL BONE PREP (MISCELLANEOUS) IMPLANT
NEEDLE HYPO 25X1 1.5 SAFETY (NEEDLE) ×2 IMPLANT
NEEDLE SPNL 20GX3.5 QUINCKE YW (NEEDLE) IMPLANT
PACK LAMINECTOMY NEURO (CUSTOM PROCEDURE TRAY) ×2 IMPLANT
PAD ARMBOARD POSITIONER FOAM (MISCELLANEOUS) ×6 IMPLANT
POWDER MORCELSS FINE 2000MG (Miscellaneous) IMPLANT
RASP 3.0MM (RASP) IMPLANT
ROD PREBENT 45MM LUMBAR (Rod) IMPLANT
ROD SPNL 55XPREBNT NS MAS (Rod) IMPLANT
SCREW LOCK FXNS SPNE MAS PL (Screw) IMPLANT
SCREW SHANK 5.0X40MM (Screw) IMPLANT
SCREW SHANK 5.5X40MM (Screw) IMPLANT
SCREW TULIP 5.5 (Screw) IMPLANT
SOLN 0.9% NACL POUR BTL 1000ML (IV SOLUTION) ×2 IMPLANT
SOLN STERILE WATER BTL 1000 ML (IV SOLUTION) ×2 IMPLANT
SOLUTION IRRIG SURGIPHOR (IV SOLUTION) ×1 IMPLANT
SPONGE SURGIFOAM ABS GEL 100 (HEMOSTASIS) ×1 IMPLANT
SPONGE T-LAP 4X18 ~~LOC~~+RFID (SPONGE) IMPLANT
STRIP CLOSURE SKIN 1/2X4 (GAUZE/BANDAGES/DRESSINGS) ×3 IMPLANT
SUT VIC AB 0 CT1 18XCR BRD8 (SUTURE) ×2 IMPLANT
SUT VIC AB 2-0 CP2 18 (SUTURE) ×2 IMPLANT
SUT VIC AB 3-0 SH 8-18 (SUTURE) ×3 IMPLANT
TOWEL GREEN STERILE (TOWEL DISPOSABLE) ×2 IMPLANT
TOWEL GREEN STERILE FF (TOWEL DISPOSABLE) ×2 IMPLANT
TRAY FOLEY MTR SLVR 16FR STAT (SET/KITS/TRAYS/PACK) IMPLANT

## 2024-04-30 NOTE — H&P (Signed)
 Subjective: Patient is a 63 y.o. male admitted for back pain. Onset of symptoms was several months ago, gradually worsening since that time.  The pain is rated severe, and is located at the across the lower back and radiates to hips. The pain is described as aching and occurs all day. The symptoms have been progressive. Symptoms are exacerbated by exercise and standing. MRI or CT showed adjacent level degeneration. Also has a malfunctioning SCS that needs to be removed as it doesn;t help his pain and causes electrical shock-like sensations  Past Medical History:  Diagnosis Date   Allergy    Anxiety    Arthritis    Back   Chronic back pain    Colon polyps    COPD (chronic obstructive pulmonary disease) (HCC)    Depression    GERD (gastroesophageal reflux disease)    every now and then   Headache    Migraines that resolved after cervical spine surgery   History of kidney stones    Hyperlipidemia 09/26/2015   Lumbar surgical wound fluid collection    lumbar wound dehiscence   Pneumonia 2020    Past Surgical History:  Procedure Laterality Date   CERVICAL SPINE SURGERY     COLONOSCOPY     COLONOSCOPY W/ BIOPSIES AND POLYPECTOMY     EYE SURGERY     as a teenager   IR US  GUIDE BX ASP/DRAIN  11/18/2020   KNEE ARTHROSCOPY WITH MEDIAL MENISECTOMY Left 01/23/2016   Procedure: KNEE ARTHROSCOPY WITH MEDIAL MENISECTOMY;  Surgeon: Maude Herald, MD;  Location: [facility];  Service: Orthopedics;  Laterality: Left;   LUMBAR LAMINECTOMY/DECOMPRESSION MICRODISCECTOMY N/A 06/09/2020   Procedure: Decompressive laminectomy Lumbarone -two;  Surgeon: Joshua Alm RAMAN, MD;  Location: [facility] OR;  Service: Neurosurgery;  Laterality: N/A;   LUMBAR WOUND DEBRIDEMENT N/A 03/21/2014   Procedure: LUMBAR WOUND DEBRIDEMENT;  Surgeon: Alm RAMAN Joshua, MD;  Location: [facility];  Service: Neurosurgery;  Laterality: N/A;   MAXIMUM ACCESS (MAS)POSTERIOR LUMBAR INTERBODY FUSION (PLIF) 1 LEVEL N/A 02/14/2014    Procedure: FOR MAXIMUM ACCESS SURGERY POSTERIOR LUMBAR INTERBODY FUSION LUMBAR TWO-THREE;  Surgeon: Alm RAMAN Joshua, MD;  Location: [facility];  Service: Neurosurgery;  Laterality: N/A;   NECK SURGERY     SHOULDER SURGERY Right    Reconstructed   TONSILLECTOMY      Prior to Admission medications  Medication Sig Start Date End Date Taking? Authorizing Provider  acetaminophen  (TYLENOL ) 500 MG tablet Take 500-1,000 mg by mouth every 6 (six) hours as needed.   Yes [provider]  albuterol  (VENTOLIN  HFA) 108 (90 Base) MCG/ACT inhaler Inhale 1-2 puffs into the lungs every 6 (six) hours as needed for wheezing or shortness of breath. 12/28/23  Yes Norleen Lynwood ORN, MD  Ascorbic Acid  (VITAMIN C PO) Take 1 tablet by mouth daily.   Yes [provider]  atorvastatin  (LIPITOR) 10 MG tablet TAKE 1 TABLET BY MOUTH EVERY DAY Patient taking differently: Take 10 mg by mouth at bedtime. 05/11/23  Yes Norleen Lynwood ORN, MD  Cholecalciferol  (VITAMIN D -3 PO) Take 1 tablet by mouth daily.   Yes [provider]  Cyanocobalamin  (VITAMIN B-12 PO) Take 1 tablet by mouth daily.   Yes [provider]  DULoxetine  (CYMBALTA ) 60 MG capsule Take 60 mg by mouth at bedtime. 11/05/14  Yes [provider]  FOLIC ACID  PO Take 1 tablet by mouth daily.   Yes [provider]  gabapentin  (NEURONTIN ) 300 MG capsule Take 300  mg by mouth in the morning, at noon, in the evening, and at bedtime.   Yes [provider]  meloxicam  (MOBIC ) 15 MG tablet Take 1 tablet (15 mg total) by mouth daily. Patient taking differently: Take 15 mg by mouth at bedtime. 09/27/18  Yes Norleen Lynwood ORN, MD  Multiple Vitamin (MULTIVITAMIN WITH MINERALS) TABS tablet Take 1 tablet by mouth daily.   Yes [provider]  phentermine  30 MG capsule TAKE 1 CAPSULE BY MOUTH EVERY DAY IN THE MORNING 03/13/24  Yes Norleen Lynwood ORN, MD  tiZANidine  (ZANAFLEX ) 4 MG tablet Take 4 mg by mouth 3 (three) times daily.   11/05/14  Yes [provider]  VITAMIN E  PO Take 1 capsule by mouth daily.   Yes [provider]  triamcinolone  (NASACORT ) 55 MCG/ACT AERO nasal inhaler Place 2 sprays into the nose daily. Patient taking differently: Place 2 sprays into the nose daily as needed (allegies/congestion.). 02/24/21   Norleen Lynwood ORN, MD   Allergies[1]  Social History   Tobacco Use   Smoking status: Every Day    Current packs/day: 0.50    Average packs/day: 0.5 packs/day for 44.0 years (22.0 ttl pk-yrs)    Types: Cigarettes   Smokeless tobacco: Current    Types: Snuff  Substance Use Topics   Alcohol use: Yes    Alcohol/week: 8.0 standard drinks of alcohol    Types: 8 Standard drinks or equivalent per week    Comment: social beer    Family History  Problem Relation Age of Onset   Colon cancer Father 16       pt thinks father had colon cancer   Stomach cancer Father    Hypertension Mother    Colon polyps Brother    Aneurysm Maternal Aunt 37       brain   Heart disease Maternal Aunt        d 18   Stomach cancer Paternal Uncle 38   Aneurysm Maternal Grandfather        stomach   Colon cancer Maternal Grandfather        thinks grandfather had colon cancer   Stomach cancer Paternal Grandfather 46   Depression Brother 20   Suicidality Brother    Stomach cancer Paternal Uncle 19   Esophageal cancer Neg Hx    Rectal cancer Neg Hx    Diabetes Neg Hx      Review of Systems  Positive ROS: neg  All other systems have been reviewed and were otherwise negative with the exception of those mentioned in the HPI and as above.  Objective: Vital signs in last 24 hours: Temp:  [98.4 F (36.9 C)] 98.4 F (36.9 C) (12/29 0757) Pulse Rate:  [84] 84 (12/29 0757) Resp:  [18] 18 (12/29 0757) BP: (155)/(89) 155/89 (12/29 0757) SpO2:  [96 %] 96 % (12/29 0757) Weight:  [117.9 kg] 117.9 kg (12/29 0757)  General Appearance: Alert, cooperative, no distress, appears stated age Head: Normocephalic,  without obvious abnormality, atraumatic Eyes: PERRL, conjunctiva/corneas clear, EOM's intact    Neck: Supple, symmetrical, trachea midline Back: Symmetric, no curvature, ROM normal, no CVA tenderness Lungs:  respirations unlabored Heart: Regular rate and rhythm Abdomen: Soft, non-tender Extremities: Extremities normal, atraumatic, no cyanosis or edema Pulses: 2+ and symmetric all extremities Skin: Skin color, texture, turgor normal, no rashes or lesions  NEUROLOGIC:   Mental status: Alert and oriented x4,  no aphasia, good attention span, fund of knowledge, and memory Motor Exam - grossly normal Sensory  Exam - grossly normal Reflexes: 1+ Coordination - grossly normal Gait - grossly normal Balance - grossly normal Cranial Nerves: I: smell Not tested  II: visual acuity  OS: nl    OD: nl  II: visual fields Full to confrontation  II: pupils Equal, round, reactive to light  III,VII: ptosis None  III,IV,VI: extraocular muscles  Full ROM  V: mastication Normal  V: facial light touch sensation  Normal  V,VII: corneal reflex  Present  VII: facial muscle function - upper  Normal  VII: facial muscle function - lower Normal  VIII: hearing Not tested  IX: soft palate elevation  Normal  IX,X: gag reflex Present  XI: trapezius strength  5/5  XI: sternocleidomastoid strength 5/5  XI: neck flexion strength  5/5  XII: tongue strength  Normal    Data Review Lab Results  Component Value Date   WBC 13.0 (H) 04/20/2024   HGB 15.7 04/20/2024   HCT 47.3 04/20/2024   MCV 96.3 04/20/2024   PLT 300 04/20/2024   Lab Results  Component Value Date   NA 139 04/20/2024   K 4.4 04/20/2024   CL 104 04/20/2024   CO2 25 04/20/2024   BUN 16 04/20/2024   CREATININE 0.77 04/20/2024   GLUCOSE 112 (H) 04/20/2024   Lab Results  Component Value Date   INR 0.9 04/20/2024    Assessment/Plan:  Estimated body mass index is 36.26 kg/m as calculated from the following:   Height as of this  encounter: 5' 11 (1.803 m).   Weight as of this encounter: 117.9 kg. Patient admitted for thoracolumbar fusion and removal of spinal cord stimulator. Patient has failed a reasonable attempt at conservative therapy.  I explained the condition and procedure to the patient and answered any questions.  Patient wishes to proceed with procedure as planned. Understands risks/ benefits and typical outcomes of procedure.   Alm GORMAN Molt 04/30/2024 9:01 AM      [1]  Allergies Allergen Reactions   Cefuroxime Axetil Hives

## 2024-04-30 NOTE — Progress Notes (Signed)
 Pharmacy Antibiotic Note  [name] is a 63 y.o. male admitted on 04/30/2024 with surgical prophylaxis.  Pharmacy has been consulted for Vancomycin  dosing.  Plan: Vancomycin  1000mg  IV x1 post op.  No drain.  Pharmacy will sign off consult.   Height: 5' 11 (180.3 cm) Weight: 117.9 kg (260 lb) IBW/kg (Calculated) : 75.3  Temp (24hrs), Avg:98.4 F (36.9 C), Min:98.1 F (36.7 C), Max:98.7 F (37.1 C)  No results for input(s): WBC, CREATININE, LATICACIDVEN, VANCOTROUGH, VANCOPEAK, VANCORANDOM, GENTTROUGH, GENTPEAK, GENTRANDOM, TOBRATROUGH, TOBRAPEAK, TOBRARND, AMIKACINPEAK, AMIKACINTROU, AMIKACIN in the last 168 hours.  Estimated Creatinine Clearance: 125 mL/min (by C-G formula based on SCr of 0.77 mg/dL).    Allergies[1]  Thank you for allowing pharmacy to be a part of this patients care.  Harlene Boga, PharmD Please refer to [facility] for [facility] Pharmacy numbers 04/30/2024 5:34 PM     [1]  Allergies Allergen Reactions   Cefuroxime Axetil Hives

## 2024-04-30 NOTE — Transfer of Care (Signed)
 Immediate Anesthesia Transfer of Care Note  Patient: [name]  Procedure(s) Performed: LAMINECTOMY WITH POSTERIOR LATERAL ARTHRODESIS LEVEL 3 (Back) LUMBAR SPINAL CORD STIMULATOR REMOVAL (Back)  Patient Location: PACU  Anesthesia Type:General  Level of Consciousness: awake, alert , and oriented  Airway & Oxygen Therapy: Patient Spontanous Breathing and Patient connected to face mask oxygen  Post-op Assessment: Report given to RN and Post -op Vital signs reviewed and stable  Post vital signs: Reviewed and stable  Last Vitals:  Vitals Value Taken Time  BP 144/81 04/30/24 13:17  Temp 36.7 C 04/30/24 13:17  Pulse 85 04/30/24 13:24  Resp 19 04/30/24 13:24  SpO2 95 % 04/30/24 13:24  Vitals shown include unfiled device data.  Last Pain:  Vitals:   04/30/24 1317  TempSrc:   PainSc: 5       Patients Stated Pain Goal: 1 (04/30/24 0804)  Complications: No notable events documented.

## 2024-04-30 NOTE — Progress Notes (Signed)
 Orthopedic Tech Progress Note Patient Details:  LEVEN HOEL [birth year] [id number]  Ortho Devices Type of Ortho Device: Lumbar corsett Ortho Device/Splint Location: BACK Ortho Device/Splint Interventions: Ordered, Application, Adjustment3C RN called requesting a LSO BRACE for patient   Post Interventions Patient Tolerated: Well Instructions Provided: Care of device  Delanna LITTIE Pac 04/30/2024, 3:45 PM

## 2024-04-30 NOTE — Anesthesia Procedure Notes (Signed)
 Procedure Name: Intubation Date/Time: 04/30/2024 10:22 AM  Performed by: Mannie Krystal LABOR, CRNAPre-anesthesia Checklist: Patient identified, Emergency Drugs available, Suction available and Patient being monitored Patient Re-evaluated:Patient Re-evaluated prior to induction Oxygen Delivery Method: Circle system utilized Preoxygenation: Pre-oxygenation with 100% oxygen Induction Type: IV induction Ventilation: Two handed mask ventilation required and Oral airway inserted - appropriate to patient size Laryngoscope Size: Mac and 4 Grade View: Grade II Tube type: Oral Tube size: 7.5 mm Number of attempts: 1 Airway Equipment and Method: Stylet and Oral airway Placement Confirmation: ETT inserted through vocal cords under direct vision, positive ETCO2 and breath sounds checked- equal and bilateral Secured at: 23 cm Tube secured with: Tape Dental Injury: Teeth and Oropharynx as per pre-operative assessment

## 2024-04-30 NOTE — Anesthesia Preprocedure Evaluation (Addendum)
"                                    Anesthesia Evaluation  Patient identified by MRN, date of birth, ID band Patient awake    Reviewed: Allergy & Precautions, NPO status , Patient's Chart, lab work & pertinent test results  History of Anesthesia Complications Negative for: history of anesthetic complications  Airway Mallampati: II  TM Distance: >3 FB Neck ROM: Full    Dental  (+) Dental Advisory Given, Teeth Intact   Pulmonary sleep apnea (noncompliant) , COPD,  COPD inhaler, Current SmokerPatient did not abstain from smoking.   Pulmonary exam normal        Cardiovascular negative cardio ROS Normal cardiovascular exam     Neuro/Psych  Headaches PSYCHIATRIC DISORDERS Anxiety Depression     Neuromuscular disease    GI/Hepatic Neg liver ROS,GERD  Controlled,,  Endo/Other   Obesity   Renal/GU negative Renal ROS     Musculoskeletal  (+) Arthritis , Osteoarthritis,    Abdominal   Peds  Hematology negative hematology ROS (+)   Anesthesia Other Findings   Reproductive/Obstetrics                              Anesthesia Physical Anesthesia Plan  ASA: 3  Anesthesia Plan: General   Post-op Pain Management: Tylenol  PO (pre-op)* and Celebrex  PO (pre-op)*   Induction: Intravenous  PONV Risk Score and Plan: 1 and Treatment may vary due to age or medical condition, Ondansetron , Dexamethasone  and Midazolam   Airway Management Planned: Oral ETT  Additional Equipment: None  Intra-op Plan:   Post-operative Plan: Extubation in OR  Informed Consent: I have reviewed the patients History and Physical, chart, labs and discussed the procedure including the risks, benefits and alternatives for the proposed anesthesia with the patient or authorized representative who has indicated his/her understanding and acceptance.     Dental advisory given  Plan Discussed with: CRNA and Anesthesiologist  Anesthesia Plan Comments:           Anesthesia Quick Evaluation  "

## 2024-04-30 NOTE — Op Note (Signed)
 04/30/2024  1:13 PM  PATIENT:  [name]  63 y.o. male  PRE-OPERATIVE DIAGNOSIS:  1.  Adjacent level degeneration with retrolisthesis, degenerative disc disease, and instability with back pain and leg pain, 2.  Spinal cord stimulator end-of-life  POST-OPERATIVE DIAGNOSIS:  same  PROCEDURE:   Removal of non-percutaneous spinal cord stimulator system 2. Posterior fixation L1-2 using NuVasive cortical pedicle screws.  4. Intertransverse arthrodesis L1-2 using morcellized allograft.  SURGEON:  Alm Molt, MD  ASSISTANTS: none  ANESTHESIA:  General  EBL: 100 ml  Total I/O In: 1500 [I.V.:1500] Out: 100 [Blood:100]  BLOOD ADMINISTERED:none  DRAINS: none   INDICATION FOR PROCEDURE: This patient presented with back pain and flank pain and hip pain. Imaging revealed severe adjacent level spondylosis at L1-2.  He was fused L2-S1 and had a solid arthrodesis and had adjacent level scoliosis and degenerative disc disease with retrolisthesis.  He had a previous laminectomy at this level for placement of a spinal cord paddle lead stimulator.  This had reached the end of life for little longer charge but when it was charged would give him electrical shocks.  The plan was to remove the stimulator.  The patient tried a reasonable attempt at conservative medical measures without relief. I recommended decompression and instrumented fusion to address the stenosis as well as the segmental  instability.  Patient understood the risks, benefits, and alternatives and potential outcomes and wished to proceed.  PROCEDURE DETAILS:  The patient was brought to the operating room. After induction of generalized endotracheal anesthesia the patient was rolled into the prone position on chest rolls and all pressure points were padded. The patient's lumbar region was cleaned and then prepped with DuraPrep and draped in the usual sterile fashion. Anesthesia was injected and then a dorsal midline incision was  made and carried down to the lumbosacral fascia.  I also made an incision over the old battery.  I was able to expose the battery and remove it from the pocket and cut the leads going into the battery.  The fascia was opened and the paraspinous musculature was taken down in a subperiosteal fashion to expose L1-2 as well as the previously placed instrumentation at L2 and L3.  I dissected the stimulator leads and cut the sharply at the laminectomy site.  I then removed the leads from the patient by pulling them through the battery site incision.. A self-retaining retractor was placed. Intraoperative fluoroscopy confirmed my level, and I started with placement of the L1 cortical pedicle screws. The pedicle screw entry zones were identified utilizing surface landmarks and  AP and lateral fluoroscopy. I scored the cortex with the high-speed drill and then used the hand drill to drill an upward and outward direction into the pedicle. I then tapped line to line. I then placed a 5.5 x 40 mm cortical pedicle screw into the pedicles of L1 bilaterally.  Because I got good screws at L1 I decided not to extend the pedicle screws to T12 and T11 as his particular anatomy T12 was dysmorphic.  I thought the risks of placing the screw outweighed the risks of not placing the screws and using only the L1-2 hardware.     We then decorticated the transverse processes and laid a mixture of morcellized  allograft out over these to perform intertransverse arthrodesis at L1-2. We then placed lordotic rods into the multiaxial screw heads of the pedicle screws and locked these in position with the locking caps and anti-torque device. We then  checked our construct with AP and lateral fluoroscopy. Irrigated with copious amounts of 0.5% povidone iodine solution followed by saline solution. then we closed the muscle and the fascia with 0 Vicryl. Closed the subcutaneous tissues with 2-0 Vicryl and subcuticular tissues with 3-0 Vicryl. The skin  was closed with benzoin and Steri-Strips.  We closed the battery incision in layers of 2-0 Vicryl in the subcutaneous tissues and 3-0 Vicryl in the subcuticular tissues.  Because of his history of poor wound healing we placed 2 g of myriad fine morsels into the lumbar wound prior to closure of the subcutaneous tissues.  Dressing was then applied, the patient was awakened from general anesthesia and transported to the recovery room in stable condition. At the end of the procedure all sponge, needle and instrument counts were correct.   PLAN OF CARE: admit to inpatient  PATIENT DISPOSITION:  PACU - hemodynamically stable.   Delay start of Pharmacological VTE agent (>24hrs) due to surgical blood loss or risk of bleeding:  yes

## 2024-04-30 NOTE — Anesthesia Postprocedure Evaluation (Signed)
"   Anesthesia Post Note  Patient: Isidoro Santillana Stratton  Procedure(s) Performed: LAMINECTOMY WITH POSTERIOR LATERAL ARTHRODESIS LEVEL 3 (Back) LUMBAR SPINAL CORD STIMULATOR REMOVAL (Back)     Patient location during evaluation: PACU Anesthesia Type: General Level of consciousness: awake and alert Pain management: pain level controlled Vital Signs Assessment: post-procedure vital signs reviewed and stable Respiratory status: spontaneous breathing, nonlabored ventilation, respiratory function stable and patient connected to nasal cannula oxygen Cardiovascular status: blood pressure returned to baseline and stable Postop Assessment: no apparent nausea or vomiting Anesthetic complications: no   No notable events documented.  Last Vitals:  Vitals:   04/30/24 1415 04/30/24 1430  BP: (!) 145/82 (!) 153/87  Pulse: 76 79  Resp: 20 (!) 9  Temp:    SpO2: 92% 94%    Last Pain:  Vitals:   04/30/24 1439  TempSrc:   PainSc: 5                  Debby FORBES Like      "

## 2024-05-01 ENCOUNTER — Encounter (HOSPITAL_COMMUNITY): Payer: Self-pay | Admitting: Neurological Surgery

## 2024-05-01 DIAGNOSIS — M51361 Other intervertebral disc degeneration, lumbar region with lower extremity pain only: Secondary | ICD-10-CM | POA: Diagnosis not present

## 2024-05-01 MED ORDER — TIZANIDINE HCL 4 MG PO TABS: 4.0000 mg | ORAL_TABLET | Freq: Three times a day (TID) | ORAL | 2 refills | Status: AC

## 2024-05-01 MED ORDER — OXYCODONE-ACETAMINOPHEN 10-325 MG PO TABS: 1.0000 | ORAL_TABLET | Freq: Four times a day (QID) | ORAL | 0 refills | Status: DC | PRN

## 2024-05-01 NOTE — Evaluation (Signed)
 Occupational Therapy Evaluation Patient Details Name: [name] MRN: [id number] DOB: 1960-11-26 Today's Date: 05/01/2024   History of Present Illness   [name] is a 63 yo male who is s/p  posterior lateral fusion L1-2 with removal of SCS 12/29. PMHx: anxiety, COPD, hyperlipidemia, PNA     Clinical Impressions [name] was evaluated s/p the above spine surgery. He is indep at baseline. Upon evaluation pt was limited by back pain, spinal precautions and activity tolerance. Overall he was able to complete ADLs and mobility with mod I. Pt states his family can assist if needed at discharge. Provided cues and education on spinal precautions and compensatory techniques throughout, handout provided and pt demonstrated great recall during ADLs and mobility. Pt does not require further acute OT services. Recommend d/c home with support of family.       If plan is discharge home, recommend the following:   Assistance with cooking/housework;Assist for transportation     Functional Status Assessment   Patient has had a recent decline in their functional status and demonstrates the ability to make significant improvements in function in a reasonable and predictable amount of time.     Equipment Recommendations   None recommended by OT      Precautions/Restrictions   Precautions Precautions: Fall;Back Precaution Booklet Issued: Yes (comment) Required Braces or Orthoses: Spinal Brace Spinal Brace: Lumbar corset Restrictions Weight Bearing Restrictions Per Provider Order: No     Mobility Bed Mobility Overal bed mobility: Modified Independent             General bed mobility comments: log roll    Transfers Overall transfer level: Modified independent Equipment used: None                      Balance Overall balance assessment: Modified Independent           ADL either performed or assessed with clinical judgement   ADL Overall ADL's :  Needs assistance/impaired           General ADL Comments: mod I for most ADLs, he may need up to min A at times for LB ADLs - pt states his wife can help as needed     Vision Baseline Vision/History: 0 No visual deficits Vision Assessment?: No apparent visual deficits     Perception Perception: Within Functional Limits       Praxis Praxis: WFL       Pertinent Vitals/Pain Pain Assessment Pain Assessment: Faces Faces Pain Scale: Hurts little more Pain Location: backl Pain Descriptors / Indicators: Grimacing Pain Intervention(s): Limited activity within patient's tolerance, Monitored during session     Extremity/Trunk Assessment Upper Extremity Assessment Upper Extremity Assessment: Overall WFL for tasks assessed   Lower Extremity Assessment Lower Extremity Assessment: Overall WFL for tasks assessed   Cervical / Trunk Assessment Cervical / Trunk Assessment: Back Surgery   Communication Communication Communication: No apparent difficulties   Cognition Arousal: Alert Behavior During Therapy: WFL for tasks assessed/performed Cognition: No apparent impairments                               Following commands: Intact       Cueing  General Comments   Cueing Techniques: Verbal cues  VSS           Home Living Family/patient expects to be discharged to:: Private residence Living Arrangements: Spouse/significant other;Parent;Other relatives Available Help at Discharge: Family;Available 24  hours/day Type of Home: House Home Access: Stairs to enter [employer] of Steps: 2   Home Layout: One level     Bathroom Shower/Tub: Producer, Television/film/video: Handicapped height     Home Equipment: Agricultural Consultant (2 wheels);Cane - single point;BSC/3in1;Shower seat - built in          Prior Functioning/Environment Prior Level of Function : Independent/Modified Independent                    OT Problem List: Decreased  activity tolerance;Decreased knowledge of precautions   OT Treatment/Interventions:        OT Goals(Current goals can be found in the care plan section)   Acute Rehab OT Goals Patient Stated Goal: home OT Goal Formulation: With patient Time For Goal Achievement: 05/15/24 Potential to Achieve Goals: Good   AM-PAC OT 6 Clicks Daily Activity     Outcome Measure Help from another person eating meals?: None Help from another person taking care of personal grooming?: None Help from another person toileting, which includes using toliet, bedpan, or urinal?: None Help from another person bathing (including washing, rinsing, drying)?: None Help from another person to put on and taking off regular upper body clothing?: None Help from another person to put on and taking off regular lower body clothing?: A Little 6 Click Score: 23   End of Session Equipment Utilized During Treatment: Back brace Nurse Communication: Mobility status  Activity Tolerance: Patient tolerated treatment well Patient left: in bed  OT Visit Diagnosis: Muscle weakness (generalized) (M62.81)                Time: 9187-9169 OT Time Calculation (min): 18 min Charges:  OT General Charges $OT Visit: 1 Visit OT Evaluation $OT Eval Low Complexity: 1 Low  [name], OTR/L Acute Rehabilitation Services Office [phone] Secure Chat Communication Preferred   Richard JONETTA [name] 05/01/2024, 9:12 AM

## 2024-05-01 NOTE — Progress Notes (Signed)
 Patient alert and oriented, voided, ambulated, surgical site clean and dry no sign of infection. D/c instructions explain given to the patient all questions answered.

## 2024-05-01 NOTE — Progress Notes (Signed)
 PT Cancellation Note and Discharge  Patient Details Name: [name] MRN: [id number] DOB: 1960/10/05   Cancelled Treatment:    Reason Eval/Treat Not Completed: PT screened, no needs identified, will sign off. Discussed pt case with OT who reports pt is currently mobilizing at a modified independent level and does not require a formal PT evaluation at this time. PT signing off. If needs change, please reconsult.     Leita JONETTA Sable 05/01/2024, 10:40 AM  Leita Sable, PT, DPT Acute Rehabilitation Services Secure Chat Preferred Office: [phone]

## 2024-05-01 NOTE — Discharge Summary (Signed)
 Physician Discharge Summary  Patient ID: [name] MRN: [id number] DOB/AGE: January 07, 1961 63 y.o.  Admit date: 04/30/2024 Discharge date: 05/01/2024  Admission Diagnoses: 1.  Adjacent level degeneration with retrolisthesis, degenerative disc disease, and instability with back pain and leg pain, 2.  Spinal cord stimulator end-of-life     Discharge Diagnoses: same   Discharged Condition: good  Hospital Course: The patient was admitted on 04/30/2024 and taken to the operating room where the patient underwent posterior lateral fusion L1-2 with removal of SCS. The patient tolerated the procedure well and was taken to the recovery room and then to the floor in stable condition. The hospital course was routine. There were no complications. The wound remained clean dry and intact. Pt had appropriate back soreness. No complaints of leg pain or new N/T/W. The patient remained afebrile with stable vital signs, and tolerated a regular diet. The patient continued to increase activities, and pain was well controlled with oral pain medications.   Consults: None  Significant Diagnostic Studies:  Results for orders placed or performed during the hospital encounter of 04/20/24  Surgical pcr screen   Collection Time: 04/20/24  9:17 AM   Specimen: Nasal Mucosa; Nasal Swab  Result Value Ref Range   MRSA, PCR NEGATIVE NEGATIVE   Staphylococcus aureus NEGATIVE NEGATIVE  Type and screen   Collection Time: 04/20/24  9:31 AM  Result Value Ref Range   ABO/RH(D) O POS    Antibody Screen NEG    Sample Expiration 05/04/2024,2359    Extend sample reason      NO TRANSFUSIONS OR PREGNANCY IN THE PAST 3 MONTHS Performed at [facility], [address], [place], [place] [address]   Protime-INR   Collection Time: 04/20/24  9:55 AM  Result Value Ref Range   Prothrombin Time 12.8 11.4 - 15.2 seconds   INR 0.9 0.8 - 1.2  Basic metabolic panel per protocol   Collection Time: 04/20/24  9:55 AM  Result  Value Ref Range   Sodium 139 135 - 145 mmol/L   Potassium 4.4 3.5 - 5.1 mmol/L   Chloride 104 98 - 111 mmol/L   CO2 25 22 - 32 mmol/L   Glucose, Bld 112 (H) 70 - 99 mg/dL   BUN 16 8 - 23 mg/dL   Creatinine, Ser 9.22 0.61 - 1.24 mg/dL   Calcium  9.5 8.9 - 10.3 mg/dL   GFR, Estimated >39 >39 mL/min   Anion gap 10 5 - 15  CBC per protocol   Collection Time: 04/20/24  9:55 AM  Result Value Ref Range   WBC 13.0 (H) 4.0 - 10.5 K/uL   RBC 4.91 4.22 - 5.81 MIL/uL   Hemoglobin 15.7 13.0 - 17.0 g/dL   HCT 52.6 60.9 - 47.9 %   MCV 96.3 80.0 - 100.0 fL   MCH 32.0 26.0 - 34.0 pg   MCHC 33.2 30.0 - 36.0 g/dL   RDW 86.2 88.4 - 84.4 %   Platelets 300 150 - 400 K/uL   nRBC 0.0 0.0 - 0.2 %    DG THORACOLUMBAR SPINE Result Date: 04/30/2024 EXAM: FLUOROSCOPIC IMAGING TECHNIQUE: Fluoroscopy was provided by the radiology department for procedure. Radiologist was not present during examination. RADIATION DOSE INDEX: Reference Air Kerma: 88.08 mGy. Fluoro time 1 minute and 31 seconds. COMPARISON: None available. CLINICAL HISTORY: 461500 Elective surgery 461500 Elective surgery. FINDINGS: Intraoperative fluoroscopic imaging was performed. Intraoperative thoracolumbar surgical hardware placement. Neurostimulator in the leads again noted overlying the thoracolumbar spine. IMPRESSION: 1. Intraoperative  fluoroscopic imaging as above. Please refer to the operative report for full details. Electronically signed by: Morgane Naveau MD 04/30/2024 01:23 PM EST RP Workstation: [id number]   DG OR LOCAL ABDOMEN Result Date: 04/30/2024 EXAM: 1 VIEW XRAY OF THE ABDOMEN 04/30/2024 12:47:00 PM COMPARISON: CT renal 04/19/2022, MRI lumbar spine 12/26/2023, XR 06/06/2023. CLINICAL HISTORY: Surgery, elective [id number]. FINDINGS: BOWEL: Nonobstructive bowel gas pattern. SOFT TISSUES: Leads again noted overlying the T12-L1 level. No unexpected retained radiopaque foreign bodies. No abnormal calcifications. BONES: Rod with screw fusion  L1 through S1 with spacer at L1-L2. IMPRESSION: 1. No unexpected retained radiopaque foreign bodies. 2. Rod with screw fusion from L1 through S1 with spacer at L1-L2 and leads overlying the T12-L1 level. 3. These findings were discussed with OR staff Alissa by Dr. Margarite over the phone on 04/30/24 at 1:15pm. Electronically signed by: Morgane Naveau MD 04/30/2024 01:20 PM EST RP Workstation: [id number]   DG C-Arm 1-60 Min-No Report Result Date: 04/30/2024 Fluoroscopy was utilized by the requesting physician.  No radiographic interpretation.   DG C-Arm 1-60 Min-No Report Result Date: 04/30/2024 Fluoroscopy was utilized by the requesting physician.  No radiographic interpretation.    Antibiotics:  Anti-infectives (From admission, onward)    Start     Dose/Rate Route Frequency Ordered Stop   04/30/24 1830  vancomycin  (VANCOCIN ) IVPB 1000 mg/200 mL premix        1,000 mg 200 mL/hr over 60 Minutes Intravenous  Once 04/30/24 1735 05/01/24 0756   04/30/24 1530  ceFAZolin (ANCEF) IVPB 2g/100 mL premix  Status:  Discontinued        2 g 200 mL/hr over 30 Minutes Intravenous Every 8 hours 04/30/24 1527 04/30/24 1729   04/30/24 0815  vancomycin  (VANCOREADY) IVPB 1500 mg/300 mL        1,500 mg 150 mL/hr over 120 Minutes Intravenous On call to O.R. 04/30/24 0800 04/30/24 1037       Discharge Exam: Blood pressure (!) 155/87, pulse 87, temperature 98.8 F (37.1 C), temperature source Oral, resp. rate 18, height 5' 11 (1.803 m), weight 117.9 kg, SpO2 97%. Neurologic: Grossly normal Ambulating and voiding well incision cdi   Discharge Medications:   Allergies as of 05/01/2024       Reactions   Cefuroxime Axetil Hives        Medication List     TAKE these medications    acetaminophen  500 MG tablet Commonly known as: TYLENOL  Take 500-1,000 mg by mouth every 6 (six) hours as needed.   albuterol  108 (90 Base) MCG/ACT inhaler Commonly known as: VENTOLIN  HFA Inhale 1-2 puffs into the  lungs every 6 (six) hours as needed for wheezing or shortness of breath.   atorvastatin  10 MG tablet Commonly known as: LIPITOR TAKE 1 TABLET BY MOUTH EVERY DAY What changed: when to take this   DULoxetine  60 MG capsule Commonly known as: CYMBALTA  Take 60 mg by mouth at bedtime.   FOLIC ACID  PO Take 1 tablet by mouth daily.   gabapentin  300 MG capsule Commonly known as: NEURONTIN  Take 300 mg by mouth in the morning, at noon, in the evening, and at bedtime.   meloxicam  15 MG tablet Commonly known as: MOBIC  Take 1 tablet (15 mg total) by mouth daily. What changed: when to take this   multivitamin with minerals Tabs tablet Take 1 tablet by mouth daily.   oxyCODONE -acetaminophen  10-325 MG tablet Commonly known as: Percocet Take 1 tablet by mouth every 6 (six) hours as needed for pain.  phentermine  30 MG capsule TAKE 1 CAPSULE BY MOUTH EVERY DAY IN THE MORNING   tiZANidine  4 MG tablet Commonly known as: ZANAFLEX  Take 1 tablet (4 mg total) by mouth 3 (three) times daily.   triamcinolone  55 MCG/ACT Aero nasal inhaler Commonly known as: NASACORT  Place 2 sprays into the nose daily. What changed:  when to take this reasons to take this   VITAMIN B-12 PO Take 1 tablet by mouth daily.   VITAMIN C PO Take 1 tablet by mouth daily.   VITAMIN D -3 PO Take 1 tablet by mouth daily.   VITAMIN E  PO Take 1 capsule by mouth daily.               Durable Medical Equipment  (From admission, onward)           Start     Ordered   04/30/24 1528  DME Walker rolling  Once       Question:  Patient needs a walker to treat with the following condition  Answer:  S/P lumbar fusion   04/30/24 1527   04/30/24 1528  DME 3 n 1  Once        04/30/24 1527            Disposition: home   Final Dx: posterior lateral fusion L1-2, removal of SCS  Discharge Instructions      Remove dressing in 72 hours   Complete by: As directed    Call MD for:   Complete by: As  directed    Call MD for:  difficulty breathing, headache or visual disturbances   Complete by: As directed    Call MD for:  hives   Complete by: As directed    Call MD for:  persistant dizziness or light-headedness   Complete by: As directed    Call MD for:  persistant nausea and vomiting   Complete by: As directed    Call MD for:  redness, tenderness, or signs of infection (pain, swelling, redness, odor or green/yellow discharge around incision site)   Complete by: As directed    Call MD for:  severe uncontrolled pain   Complete by: As directed    Call MD for:  temperature >100.4   Complete by: As directed    Driving Restrictions   Complete by: As directed    No driving for 2 weeks, no riding in the car for 1 week   Increase activity slowly   Complete by: As directed    Lifting restrictions   Complete by: As directed    No lifting more than 8 lbs          Signed: Suzen Lacks Dorleen Kissel 05/01/2024, 8:01 AM

## 2024-06-05 ENCOUNTER — Ambulatory Visit: Admitting: Family Medicine

## 2024-06-05 ENCOUNTER — Encounter: Payer: Self-pay | Admitting: Family Medicine

## 2024-06-05 VITALS — BP 143/83 | HR 83 | Ht 71.0 in | Wt 266.0 lb

## 2024-06-05 DIAGNOSIS — M545 Low back pain, unspecified: Secondary | ICD-10-CM | POA: Diagnosis not present

## 2024-06-05 DIAGNOSIS — E669 Obesity, unspecified: Secondary | ICD-10-CM | POA: Diagnosis not present

## 2024-06-05 DIAGNOSIS — G8929 Other chronic pain: Secondary | ICD-10-CM | POA: Diagnosis not present

## 2024-06-05 DIAGNOSIS — Z7689 Persons encountering health services in other specified circumstances: Secondary | ICD-10-CM

## 2024-06-05 DIAGNOSIS — R030 Elevated blood-pressure reading, without diagnosis of hypertension: Secondary | ICD-10-CM

## 2024-06-05 DIAGNOSIS — E782 Mixed hyperlipidemia: Secondary | ICD-10-CM

## 2024-06-05 DIAGNOSIS — Z6838 Body mass index (BMI) 38.0-38.9, adult: Secondary | ICD-10-CM

## 2024-06-05 DIAGNOSIS — R399 Unspecified symptoms and signs involving the genitourinary system: Secondary | ICD-10-CM | POA: Insufficient documentation

## 2024-06-05 DIAGNOSIS — Z125 Encounter for screening for malignant neoplasm of prostate: Secondary | ICD-10-CM

## 2024-06-05 DIAGNOSIS — J309 Allergic rhinitis, unspecified: Secondary | ICD-10-CM | POA: Diagnosis not present

## 2024-06-05 DIAGNOSIS — Z0000 Encounter for general adult medical examination without abnormal findings: Secondary | ICD-10-CM

## 2024-06-05 MED ORDER — FLUTICASONE PROPIONATE 50 MCG/ACT NA SUSP: 2.0000 | Freq: Every day | NASAL | 6 refills | Status: AC

## 2024-06-05 MED ORDER — ATORVASTATIN CALCIUM 10 MG PO TABS: 10.0000 mg | ORAL_TABLET | Freq: Every evening | ORAL | 3 refills | Status: AC

## 2024-06-05 MED ORDER — TAMSULOSIN HCL 0.4 MG PO CAPS: 0.4000 mg | ORAL_CAPSULE | Freq: Every day | ORAL | 3 refills | Status: AC

## 2024-06-05 MED ORDER — OXYCODONE-ACETAMINOPHEN 10-325 MG PO TABS: 1.0000 | ORAL_TABLET | Freq: Four times a day (QID) | ORAL | 0 refills | Status: AC | PRN

## 2024-06-05 NOTE — Assessment & Plan Note (Signed)
 Chronic nasal congestion and dry mouth. Previous nasal sprays provided temporary relief. Discussed daily nasal corticosteroids for long-term management. - Prescribed Flonase  nasal spray, two sprays each nostril once daily.

## 2024-06-05 NOTE — Assessment & Plan Note (Signed)
 Contributes to back pain. Phentermine  ineffective. Discussed weight loss benefits on back pain. I - Scheduled follow-up in six months for weight management discussion. - Consider sleep study referral for sleep apnea assessment to aid in medication coverage.

## 2024-06-05 NOTE — Assessment & Plan Note (Signed)
 Managed with Lipitor. Last cholesterol check over a year ago. - Refilled Lipitor prescription. - Ordered lipid panel

## 2024-06-05 NOTE — Patient Instructions (Addendum)
 It was nice to see you today,  We addressed the following topics today: - I have sent in flomax  for you to take daily for your urinary frequency - I have sent in your pain medication and your lipitor - I will update you if any labs are abnormal - I have prescribed flonase  to take daily but is over the counter and [facility] is the cheapest place to get the generic flonase . You may need to take this year round.   Have a great day,  Rolan Slain, MD

## 2024-06-05 NOTE — Assessment & Plan Note (Addendum)
 Frequent nocturia and intermittent urinary flow. Normal previous PSA levels. Discussed Flomax  and finasteride with potential side effects. - Repeated PSA test. - starting Flomax  for urinary symptoms. - Discuss finasteride use if prostate significantly enlarged.

## 2024-06-06 LAB — CBC WITH DIFFERENTIAL/PLATELET
Basophils Absolute: 0.1 10*3/uL (ref 0.0–0.2)
Basos: 1 %
EOS (ABSOLUTE): 0.3 10*3/uL (ref 0.0–0.4)
Eos: 2 %
Hematocrit: 46.5 % (ref 37.5–51.0)
Hemoglobin: 15.1 g/dL (ref 13.0–17.7)
Immature Grans (Abs): 0.1 10*3/uL (ref 0.0–0.1)
Immature Granulocytes: 1 %
Lymphocytes Absolute: 2.5 10*3/uL (ref 0.7–3.1)
Lymphs: 20 %
MCH: 30.8 pg (ref 26.6–33.0)
MCHC: 32.5 g/dL (ref 31.5–35.7)
MCV: 95 fL (ref 79–97)
Monocytes Absolute: 1 10*3/uL — ABNORMAL HIGH (ref 0.1–0.9)
Monocytes: 8 %
Neutrophils Absolute: 8.5 10*3/uL — ABNORMAL HIGH (ref 1.4–7.0)
Neutrophils: 68 %
Platelets: 305 10*3/uL (ref 150–450)
RBC: 4.91 x10E6/uL (ref 4.14–5.80)
RDW: 12.7 % (ref 11.6–15.4)
WBC: 12.6 10*3/uL — ABNORMAL HIGH (ref 3.4–10.8)

## 2024-06-06 LAB — COMPREHENSIVE METABOLIC PANEL WITH GFR
ALT: 28 [IU]/L (ref 0–44)
AST: 24 [IU]/L (ref 0–40)
Albumin: 4.6 g/dL (ref 3.9–4.9)
Alkaline Phosphatase: 106 [IU]/L (ref 47–123)
BUN/Creatinine Ratio: 16 (ref 10–24)
BUN: 14 mg/dL (ref 8–27)
Bilirubin Total: 0.5 mg/dL (ref 0.0–1.2)
CO2: 22 mmol/L (ref 20–29)
Calcium: 9.6 mg/dL (ref 8.6–10.2)
Chloride: 104 mmol/L (ref 96–106)
Creatinine, Ser: 0.87 mg/dL (ref 0.76–1.27)
Globulin, Total: 2.2 g/dL (ref 1.5–4.5)
Glucose: 96 mg/dL (ref 70–99)
Potassium: 4.9 mmol/L (ref 3.5–5.2)
Sodium: 141 mmol/L (ref 134–144)
Total Protein: 6.8 g/dL (ref 6.0–8.5)
eGFR: 98 mL/min/{1.73_m2}

## 2024-06-06 LAB — LIPID PANEL
Chol/HDL Ratio: 4.4 ratio (ref 0.0–5.0)
Cholesterol, Total: 232 mg/dL — ABNORMAL HIGH (ref 100–199)
HDL: 53 mg/dL
LDL Chol Calc (NIH): 156 mg/dL — ABNORMAL HIGH (ref 0–99)
Triglycerides: 130 mg/dL (ref 0–149)
VLDL Cholesterol Cal: 23 mg/dL (ref 5–40)

## 2024-06-06 LAB — HEMOGLOBIN A1C
Est. average glucose Bld gHb Est-mCnc: 120 mg/dL
Hgb A1c MFr Bld: 5.8 % — ABNORMAL HIGH (ref 4.8–5.6)

## 2024-06-06 LAB — PSA: Prostate Specific Ag, Serum: 0.8 ng/mL (ref 0.0–4.0)

## 2024-07-11 ENCOUNTER — Ambulatory Visit

## 2024-09-26 ENCOUNTER — Other Ambulatory Visit

## 2024-10-03 ENCOUNTER — Encounter: Admitting: Family Medicine
